# Patient Record
Sex: Male | Born: 1995 | Race: Black or African American | Hispanic: No | Marital: Single | Smoking: Never smoker
Health system: Southern US, Community
[De-identification: ages and names within clinical notes are randomized; demographics above are authoritative.]

## PROBLEM LIST (undated history)

## (undated) ENCOUNTER — Emergency Department (HOSPITAL_COMMUNITY): Payer: Medicaid Other

## (undated) DIAGNOSIS — F840 Autistic disorder: Secondary | ICD-10-CM

## (undated) DIAGNOSIS — F32A Depression, unspecified: Secondary | ICD-10-CM

## (undated) DIAGNOSIS — F329 Major depressive disorder, single episode, unspecified: Secondary | ICD-10-CM

## (undated) DIAGNOSIS — G35D Multiple sclerosis, unspecified: Secondary | ICD-10-CM

## (undated) DIAGNOSIS — G35 Multiple sclerosis: Secondary | ICD-10-CM

## (undated) HISTORY — PX: NO PAST SURGERIES: SHX2092

## (undated) NOTE — *Deleted (*Deleted)
Physical Medicine and Rehabilitation Admission H&P     HPI: Jaime [name] is a 26 year old right-handed male history of mild autism, hypertension recently diagnosed multiple sclerosis March 2021 followed by neurology services Dr. Epimenio Foot.  Patient well-known to rehab services from multiple sclerosis exacerbations 12/17/2019 to 12/26/2019 as well as 05/24/2020 to 06/05/2020.  He was discharged home ambulating contact-guard assist.  He lives with his mother and 41 year old and 5 year old siblings.  Two-level home bed and bath upstairs.  Presented 07/15/2020 with relapsing multiple sclerosis weakness left greater than right.  MRI of the brain showed numerous subcentimeter enhancing lesions within the juxtacortical subcortical white matter of the bilateral cerebral hemispheres.  Placed on intravenous Solu-Medrol x5 doses.  Subcutaneous Lovenox for DVT prophylaxis.  ProAmatine ongoing for history of orthostasis.  Tolerating a regular diet.  Therapy evaluations completed and patient was admitted for a comprehensive rehab program.  Review of Systems  Constitutional: Negative for chills and fever.  HENT: Negative for hearing loss.   Eyes: Positive for blurred vision.  Respiratory: Negative for cough and shortness of breath.   Cardiovascular: Positive for leg swelling.  Gastrointestinal: Positive for constipation. Negative for heartburn and vomiting.  Genitourinary: Negative for dysuria, flank pain and hematuria.  Musculoskeletal: Positive for myalgias.  Skin: Negative for rash.  Neurological: Positive for tremors, sensory change and weakness.  Psychiatric/Behavioral: Positive for depression.       Mild autism  All other systems reviewed and are negative.  Past Medical History:  Diagnosis Date  . Autism   . Depression   . MS (multiple sclerosis) (HCC)    Past Surgical History:  Procedure Laterality Date  . NO PAST SURGERIES     Family History  Problem Relation Age of Onset  . Clotting  disorder Mother    Social History:  reports that he has never smoked. He has never used smokeless tobacco. He reports that he does not drink alcohol and does not use drugs. Allergies: No Known Allergies Medications Prior to Admission  Medication Sig Dispense Refill  . midodrine (PROAMATINE) 2.5 MG tablet Take 3 tablets (7.5 mg total) by mouth 3 (three) times daily with meals. 90 tablet 0  . pantoprazole (PROTONIX) 40 MG tablet Take 1 tablet (40 mg total) by mouth daily. 30 tablet 0  . acetaminophen (TYLENOL) 325 MG tablet Take 2 tablets (650 mg total) by mouth every 4 (four) hours as needed for mild pain or fever (or Fever >/= 101). (Patient not taking: Reported on 07/15/2020)    . melatonin 3 MG TABS tablet Take 2 tablets (6 mg total) by mouth at bedtime. (Patient not taking: Reported on 06/19/2020) 30 tablet 0  . senna (SENOKOT) 8.6 MG TABS tablet Take 2 tablets (17.2 mg total) by mouth 2 (two) times daily at 10 AM and 5 PM. (Patient not taking: Reported on 07/15/2020) 120 tablet 0  . sertraline (ZOLOFT) 50 MG tablet Take 1 tablet (50 mg total) by mouth daily. (Patient not taking: Reported on 07/15/2020) 30 tablet 11    Drug Regimen Review Drug regimen was reviewed and remains appropriate with no significant issues identified  Home: Home Living Family/patient expects to be discharged to:: Private residence Living Arrangements: Parent, Other relatives Available Help at Discharge: Family, Available 24 hours/day Type of Home: Apartment Home Access: Stairs to enter [employer] of Steps: 2 without rail at back entrance, 4 wiht bil rail at front entrance, per pt Entrance Stairs-Rails: Can reach both Home Layout: Two level, Bed/bath upstairs  Alternate Level Stairs-Number of Steps: full flight Alternate Level Stairs-Rails: Right Bathroom Shower/Tub: Engineer, manufacturing systems: Standard Bathroom Accessibility: Yes Home Equipment: Walker - 2 wheels   Functional History:  Prior Function Level of Independence: Needs assistance Gait / Transfers Assistance Needed: pt reports he has been ambulating with use of a walker recently. Fell on 10/8 attempting to get to the bathroom. Pt's mother has been assisting with stairs and ADLs  Functional Status:  Mobility: Bed Mobility Overal bed mobility: Needs Assistance Bed Mobility: Supine to Sit, Sit to Supine Supine to sit: HOB elevated, Supervision Sit to supine: Min assist General bed mobility comments: min A to position trunk back into bed Transfers Overall transfer level: Needs assistance Equipment used: Rolling walker (2 wheeled) Transfers: Sit to/from Stand Sit to Stand: Mod assist General transfer comment: mod A to power up to standing and stabilize, cues for hand placement and problem solve getting walker around obstacle in bathroom Ambulation/Gait Ambulation/Gait assistance: Min assist Gait Distance (Feet): 10 Feet Assistive device: Rolling walker (2 wheeled) Gait Pattern/deviations: Step-to pattern General Gait Details: pt with short step to gait, tremors noted during ambulation Gait velocity: reduced Gait velocity interpretation: <1.31 ft/sec, indicative of household ambulator    ADL: ADL Overall ADL's : Needs assistance/impaired Grooming: Minimal assistance, Sitting Upper Body Bathing: Minimal assistance, Sitting Lower Body Bathing: Moderate assistance, Sit to/from stand, Sitting/lateral leans Upper Body Dressing : Minimal assistance, Sitting Lower Body Dressing: Moderate assistance, Sitting/lateral leans Lower Body Dressing Details (indicate cue type and reason): patient able to doff sock with min A for sitting balance and unable to don  Toilet Transfer: Moderate assistance, Ambulation, Cueing for safety, Cueing for sequencing, Grab bars, RW (bedside commode over toilet ) Toilet Transfer Details (indicate cue type and reason): mod A to power up to standing and stabilize, cues to reach back for  commode arms with sitting for eccentric control  Toileting- Clothing Manipulation and Hygiene: Maximal assistance, Sit to/from stand Toileting - Clothing Manipulation Details (indicate cue type and reason): to manage underwear due to decreased standing balance Functional mobility during ADLs: Moderate assistance, Cueing for safety, Cueing for sequencing, Rolling walker General ADL Comments: patient requiring increased assistance for self care due to decreased balance, activity tolerance, safety awareness  Cognition: Cognition Overall Cognitive Status: History of cognitive impairments - at baseline Orientation Level: Oriented X4 Cognition Arousal/Alertness: Awake/alert Behavior During Therapy: Flat affect Overall Cognitive Status: History of cognitive impairments - at baseline General Comments: slowed processing at times, pt with history of high functioning autism. Pt has good awareness of situation and follows commands well.  Physical Exam: Blood pressure 115/76, pulse 71, temperature 98.4 F (36.9 C), temperature source Oral, resp. rate 18, weight 53.6 kg, SpO2 96 %. Physical Exam Neurological:     Comments: Patient is alert.  Mood is a bit flat but appropriate.  He makes eye contact with examiner.  Provides his name and age.  He does recall his latest stay on inpatient rehab services.     Results for orders placed or performed during the hospital encounter of 07/15/20 (from the past 48 hour(s))  Basic metabolic panel     Status: Abnormal   Collection Time: 07/16/20  7:33 AM  Result Value Ref Range   Sodium 139 135 - 145 mmol/L   Potassium 4.0 3.5 - 5.1 mmol/L   Chloride 105 98 - 111 mmol/L   CO2 22 22 - 32 mmol/L   Glucose, Bld 141 (H) 70 - 99 mg/dL  Comment: Glucose reference range applies only to samples taken after fasting for at least 8 hours.   BUN 15 6 - 20 mg/dL   Creatinine, Ser 1.61 0.61 - 1.24 mg/dL   Calcium 09.6 8.9 - 04.5 mg/dL   GFR, Estimated >40 >98 mL/min    Anion gap 12 5 - 15    Comment: Performed at [facility], [address], [place], [place] [address]  CBC     Status: None   Collection Time: 07/16/20  7:33 AM  Result Value Ref Range   WBC 10.4 4.0 - 10.5 K/uL   RBC 5.00 4.22 - 5.81 MIL/uL   Hemoglobin 15.5 13.0 - 17.0 g/dL   HCT 78.2 39 - 52 %   MCV 90.4 80.0 - 100.0 fL   MCH 31.0 26.0 - 34.0 pg   MCHC 34.3 30.0 - 36.0 g/dL   RDW 95.6 21.3 - 08.6 %   Platelets 258 150 - 400 K/uL   nRBC 0.0 0.0 - 0.2 %    Comment: Performed at [facility], [address] [place], [place] [address]   MR Brain W and Wo Contrast  Result Date: 07/15/2020 CLINICAL DATA:  Multiple sclerosis, new event; new left leg weakness. EXAM: MRI HEAD WITHOUT AND WITH CONTRAST TECHNIQUE: Multiplanar, multiecho pulse sequences of the brain and surrounding structures were obtained without and with intravenous contrast. CONTRAST:  5.65mL GADAVIST GADOBUTROL 1 MMOL/ML IV SOLN COMPARISON:  Brain MRI 05/19/2020. FINDINGS: Brain: Mild intermittent motion degradation. This includes mild motion degradation of the axial T1 weighted postcontrast sequence. There are numerous subcentimeter enhancing lesions within the juxtacortical and subcortical white matter of the bilateral cerebral hemispheres (at least 15-20 in number) (see annotations on series 32, image 34). The majority of, if not all of, these enhancing lesions appear new as compared to the prior examination of 05/19/2020. As before, there is background severe multifocal T2/FLAIR hyperintense signal abnormality within the subcortical/juxtacortical and deep cerebral white matter, corpus callosum, thalami, brainstem, cerebellum and middle cerebellar peduncles. No evidence of intracranial mass. No chronic intracranial blood products. No extra-axial fluid collection. No midline shift. Vascular: Expected proximal arterial flow voids. Skull and upper cervical spine: No focal marrow lesion. Sinuses/Orbits: Visualized orbits  show no acute finding. Trace scattered paranasal sinus mucosal thickening. No significant mastoid effusion. IMPRESSION: Mildly motion degraded examination. Numerous (numbering at least 15-20) subcentimeter enhancing lesions within the juxtacortical and subcortical white matter of the bilateral cerebral hemispheres. The majority of, if not all of, these enhancing lesions appear new as compared to the MRI of 05/19/2020 and are consistent with sites of active demyelination. As before, there is background severe multifocal T2 hyperintense signal abnormality within the cerebral white matter, corpus callosum, thalami, brainstem, cerebellum and middle cerebellar peduncles. These findings are consistent with the provided history of multiple sclerosis. Electronically Signed   By: Jackey Loge DO   On: 07/15/2020 15:09   MR Cervical Spine W or Wo Contrast  Result Date: 07/15/2020 CLINICAL DATA:  Multiple sclerosis, new event. Additional provided: Weakness, difficulty walking. EXAM: MRI CERVICAL SPINE WITHOUT AND WITH CONTRAST TECHNIQUE: Multiplanar and multiecho pulse sequences of the cervical spine, to include the craniocervical junction and cervicothoracic junction, were obtained without and with intravenous contrast. CONTRAST:  5.63mL GADAVIST GADOBUTROL 1 MMOL/ML IV SOLN COMPARISON:  Cervical spine MRI 12/11/2019. FINDINGS: The examination is intermittently motion degraded, limiting evaluation. Most notably, there is moderate motion degradation of the axial T2 GRE sequence, moderate motion degradation  of the axial T2 weighted sequence and moderate/severe motion degradation of the postcontrast axial T1 weighted sequence. Alignment: Straightening of the expected cervical lordosis. No significant spondylolisthesis. Vertebrae: Vertebral body height is maintained. No focal suspicious osseous lesion or significant marrow edema. Cord: Similar to the prior MRI of 12/11/2019, there is extensive multifocal T2/STIR hyperintense  signal abnormality throughout the majority of the cervical spinal cord. Within the limitations of motion degradation, no abnormal cord enhancement is identified to suggest active demyelination. There is associated mild diffuse atrophy of the cervical spinal cord. Posterior Fossa, vertebral arteries, paraspinal tissues: Posterior fossa better assessed on the concurrently performed brain MRI. Flow voids preserved within the imaged cervical vertebral arteries. Paraspinal soft tissues within normal limits. Disc levels: Intervertebral disc height is maintained. No significant disc herniation, spinal canal stenosis or neural foraminal narrowing at any level. IMPRESSION: Motion degraded examination as described and limiting evaluation. Similar to the prior MRI of 12/11/2019, there is extensive multifocal T2 hyperintense signal abnormality affecting the majority of the cervical spinal cord. There is associated mild diffuse cord atrophy and these findings are consistent with the provided history of multiple sclerosis. Within the limitations of motion degradation, no abnormal enhancement of the cervical spinal cord is identified to suggest active demyelination. Electronically Signed   By: Jackey Loge DO   On: 07/15/2020 15:19   MR THORACIC SPINE W WO CONTRAST  Result Date: 07/15/2020 CLINICAL DATA:  Demyelinating disease; MS flare suspected, new left leg weakness. EXAM: MRI THORACIC WITHOUT AND WITH CONTRAST TECHNIQUE: Multiplanar and multiecho pulse sequences of the thoracic spine were obtained without and with intravenous contrast. CONTRAST:  5.39mL GADAVIST GADOBUTROL 1 MMOL/ML IV SOLN COMPARISON:  Thoracic spine MRI 12/16/2019. FINDINGS: MRI THORACIC SPINE FINDINGS Alignment:  No significant spondylolisthesis. Vertebrae: Vertebral body height is maintained. No suspicious osseous lesion or significant marrow edema. Cord: Again demonstrated is extensive multifocal T2/STIR hyperintense signal abnormality throughout the  majority of the thoracic spinal cord. There are several lesions which are new or significantly progressed as compared to the prior examination of 12/16/2019 as follows. A lesion within the central cord at the T4 level (series 23, image 10). A lesion within the right dorsal lateral aspect of the cord at the T5 level (series 23, image 13). A lesion within the right aspect of the spinal cord at the T6 level (series 23, image 16). A lesion within the central cord at the T7-T8 level (series 23, image 19). A lesion within the central cord at the T11 level (series 23, image 30). Faint contrast enhancement is questioned at the sites of the T4 and T5 level lesions and this reflect active demyelination (series 26, image 11). No definite abnormal enhancement is identified elsewhere within the thoracic cord. Paraspinal and other soft tissues: No abnormality identified within included portions of the thorax or upper abdomen. Disc levels: At T4-T5, there is unchanged tiny central disc protrusion which partially effaces the ventral thecal sac and contacts the ventral spinal cord. No significant central canal stenosis. At T6-T7, there is an unchanged small central disc protrusion which partially effaces the ventral thecal sac and contacts the ventral spinal cord. No significant spinal canal stenosis. No significant foraminal narrowing at any level. IMPRESSION: Extensive multifocal T2 hyperintense signal abnormality affecting the majority of the thoracic spinal cord. There are T2 hyperintense lesions at the T4, T5, T6, T7-T8 and T11 levels which are new or significantly progressed as compared to the MRI of 12/16/2019. These findings are consistent with the  provided history of demyelinating disease/multiple sclerosis. There is questionable enhancement corresponding with the T4 and T5 level lesions and this may reflect active demyelination. Electronically Signed   By: Jackey Loge DO   On: 07/15/2020 15:38       Medical Problem  List and Plan: 1.  Decreased functional ability secondary to multiple sclerosis exacerbation.  IV Solu-Medrol x5 completed.  Follow-up outpatient Dr. Epimenio Foot  -patient may *** shower  -ELOS/Goals: *** 2.  Antithrombotics: -DVT/anticoagulation: Subcutaneous Lovenox  -antiplatelet therapy: N/A 3. Pain Management: Tylenol as needed 4. Mood/mild autism: Patient is cognitively at baseline.  Melatonin 6 mg nightly as needed  -antipsychotic agents: N/A 5. Neuropsych: This patient is capable of making decisions on his own behalf. 6. Skin/Wound Care: Routine skin checks 7. Fluids/Electrolytes/Nutrition: Routine in and outs with follow-up chemistries 8.  Orthostasis.  ProAmatine 7.5 mg 3 times daily.  Monitor with increased mobility 9.  Constipation.  MiraLAX as needed    ***  Charlton Amor, PA-C 07/17/2020

---
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
## 2015-10-03 ENCOUNTER — Encounter (HOSPITAL_COMMUNITY): Payer: Self-pay | Admitting: Emergency Medicine

## 2015-10-03 ENCOUNTER — Emergency Department (HOSPITAL_COMMUNITY)
Admission: EM | Admit: 2015-10-03 | Discharge: 2015-10-03 | Disposition: A | Discharge status: Home / Self Care | Payer: Medicaid Other | Attending: Emergency Medicine | Admitting: Emergency Medicine

## 2015-10-03 DIAGNOSIS — R300 Dysuria: Secondary | ICD-10-CM | POA: Insufficient documentation

## 2015-10-03 DIAGNOSIS — R350 Frequency of micturition: Secondary | ICD-10-CM | POA: Diagnosis present

## 2015-10-03 LAB — URINALYSIS, ROUTINE W REFLEX MICROSCOPIC
Bilirubin Urine: NEGATIVE
GLUCOSE, UA: NEGATIVE mg/dL
Hgb urine dipstick: NEGATIVE
KETONES UR: NEGATIVE mg/dL
LEUKOCYTES UA: NEGATIVE
NITRITE: NEGATIVE
PH: 6.5 (ref 5.0–8.0)
Protein, ur: NEGATIVE mg/dL
SPECIFIC GRAVITY, URINE: 1.003 — AB (ref 1.005–1.030)

## 2015-10-03 LAB — CBG MONITORING, ED: Glucose-Capillary: 81 mg/dL (ref 65–99)

## 2015-10-03 NOTE — ED Notes (Signed)
Patient left at this time with all belongings. 

## 2015-10-03 NOTE — ED Provider Notes (Signed)
CSN: [id number]     Arrival date & time 10/03/15  1445 History   First MD Initiated Contact with Patient 10/03/15 2030     Chief Complaint  Patient presents with  . Urinary Frequency     (Consider location/radiation/quality/duration/timing/severity/associated sxs/prior Treatment) Patient is a 19 y.o. male presenting with frequency. The history is provided by the patient and a parent.  Urinary Frequency This is a new problem. The current episode started in the past 7 days. The problem occurs constantly. The problem has been unchanged. Associated symptoms include urinary symptoms. Pertinent negatives include no abdominal pain, anorexia, arthralgias, change in bowel habit, chest pain, chills, congestion, coughing, diaphoresis, fatigue, fever, headaches, joint swelling, myalgias, nausea, neck pain, numbness, sore throat, swollen glands, vertigo, visual change, vomiting or weakness. Nothing aggravates the symptoms. He has tried nothing for the symptoms. The treatment provided no relief.    History reviewed. No pertinent past medical history. History reviewed. No pertinent past surgical history. No family history on file. Social History  Substance Use Topics  . Smoking status: Never Smoker   . Smokeless tobacco: None  . Alcohol Use: No    Review of Systems  Constitutional: Negative for fever, chills, diaphoresis and fatigue.  HENT: Negative for congestion and sore throat.   Eyes: Negative for pain.  Respiratory: Negative for cough.   Cardiovascular: Negative for chest pain.  Gastrointestinal: Negative for nausea, vomiting, abdominal pain, anorexia and change in bowel habit.  Genitourinary: Positive for dysuria and frequency. Negative for hematuria, flank pain, discharge, penile swelling, scrotal swelling, difficulty urinating, penile pain and testicular pain.  Musculoskeletal: Negative for myalgias, joint swelling, arthralgias and neck pain.  Neurological: Negative for vertigo, weakness,  numbness and headaches.      Allergies  Review of patient's allergies indicates no known allergies.  Home Medications   Prior to Admission medications   Not on File   BP 106/76 mmHg  Pulse 88  Temp(Src) 99.1 F (37.3 C) (Oral)  Resp 16  Ht 5\' 9"  (1.753 m)  SpO2 100% Physical Exam  Constitutional: He is oriented to person, place, and time. He appears well-developed and well-nourished. No distress.  HENT:  Head: Normocephalic and atraumatic.  Eyes: Conjunctivae and EOM are normal. Pupils are equal, round, and reactive to light.  Neck: Normal range of motion. Neck supple.  Cardiovascular: Normal rate, regular rhythm and normal heart sounds.  Exam reveals no gallop and no friction rub.   No murmur heard. Pulmonary/Chest: Effort normal and breath sounds normal. No respiratory distress. He has no wheezes. He has no rales. He exhibits no tenderness.  Abdominal: Soft. Bowel sounds are normal. He exhibits no distension and no mass. There is no tenderness. There is no rebound and no guarding. Hernia confirmed negative in the right inguinal area and confirmed negative in the left inguinal area.  Genitourinary: Testes normal and penis normal. Right testis shows no mass, no swelling and no tenderness. Right testis is descended. Left testis shows no mass, no swelling and no tenderness. Left testis is descended. Circumcised. No penile erythema or penile tenderness. No discharge found.  Lymphadenopathy:       Right: No inguinal adenopathy present.       Left: No inguinal adenopathy present.  Neurological: He is alert and oriented to person, place, and time.  Skin: Skin is warm and dry. He is not diaphoretic. No erythema.    ED Course  Procedures (including critical care time) Labs Review Labs Reviewed  URINALYSIS, ROUTINE W  REFLEX MICROSCOPIC (NOT AT [facility]) - Abnormal; Notable for the following:    Specific Gravity, Urine 1.003 (*)    All other components within normal limits  CBG  MONITORING, ED    Imaging Review No results found. I have personally reviewed and evaluated these images and lab results as part of my medical decision-making.   EKG Interpretation None      MDM   Final diagnoses:  Urinary frequency    19 year old with no significant past medical history presents in setting urinary frequency. Per patient and mother patient has had increased urine during this time with mild discomfort at times. Patient denies any hematuria, rashes, fevers, trauma, significant abdominal pain, consultation, diarrhea, recent sick contacts or recent travel. Patient had urinalysis performed which did not show urinary tract infection. Patient afebrile vital signs within normal limits. Fingerstick blood glucose within normal limits. I have low suspicion for diabetes at this time as cause of issue. I discussed sexual activity with patient with parents out of the room and patient adamantly denies history of sexual contact.  On physical examination patient has no significant abnormalities noted. No signs of infection or foreign body. Patient did not want any medication for pain as he denies any pain at this time. At this time this is urinary frequency of unknown origin. No signs of infection, diabetes, suspicion for sexual transmitted infection. Will discharge patient home at this time with plan to follow with PCP for further management of symptoms. Patient given number to establish primary care doctor and given strict return precautions. Patient stable at time of discharge and family in agreement with plan.  Attending is seen and evaluated the patient and Dr. Rhunette Croft in agreement with plan.    Stacy Gardner, MD 10/03/15 8119  Derwood Kaplan, MD 10/04/15 [phone]

## 2015-10-03 NOTE — Discharge Instructions (Signed)
Urinary Frequency °The number of times a normal person urinates depends upon how much liquid they take in and how much liquid they are losing. If the temperature is hot and there is high humidity, then the person will sweat more and usually breathe a little more frequently. These factors decrease the amount of frequency of urination that would be considered normal. °The amount you drink is easily determined, but the amount of fluid lost is sometimes more difficult to calculate.  °Fluid is lost in two ways: °· Sensible fluid loss is usually measured by the amount of urine that you get rid of. Losses of fluid can also occur with diarrhea. °· Insensible fluid loss is more difficult to measure. It is caused by evaporation. Insensible loss of fluid occurs through breathing and sweating. It usually ranges from a little less than a quart to a little more than a quart of fluid a day. °In normal temperatures and activity levels, the average person may urinate 4 to 7 times in a 24-hour period. Needing to urinate more often than that could indicate a problem. If one urinates 4 to 7 times in 24 hours and has large volumes each time, that could indicate a different problem from one who urinates 4 to 7 times a day and has small volumes. The time of urinating is also important. Most urinating should be done during the waking hours. Getting up at night to urinate frequently can indicate some problems. °CAUSES  °The bladder is the organ in your lower abdomen that holds urine. Like a balloon, it swells some as it fills up. Your nerves sense this and tell you it is time to head for the bathroom. There are a number of reasons that you might feel the need to urinate more often than usual. They include: °· Urinary tract infection. This is usually associated with other signs such as burning when you urinate. °· In men, problems with the prostate (a walnut-size gland that is located near the tube that carries urine out of your body). There  are two reasons why the prostate can cause an increased frequency of urination: °¨ An enlarged prostate that does not let the bladder empty well. If the bladder only half empties when you urinate, then it only has half the capacity to fill before you have to urinate again. °¨ The nerves in the bladder become more hypersensitive with an increased size of the prostate even if the bladder empties completely. °· Pregnancy. °· Obesity. Excess weight is more likely to cause a problem for women than for men. °· Bladder stones or other bladder problems. °· Caffeine. °· Alcohol. °· Medications. For example, drugs that help the body get rid of extra fluid (diuretics) increase urine production. Some other medicines must be taken with lots of fluids. °· Muscle or nerve weakness. This might be the result of a spinal cord injury, a stroke, multiple sclerosis, or Parkinson disease. °· Long-standing diabetes can decrease the sensation of the bladder. This loss of sensation makes it harder to sense the bladder needs to be emptied. Over a period of years, the bladder is stretched out by constant overfilling. This weakens the bladder muscles so that the bladder does not empty well and has less capacity to fill with new urine. °· Interstitial cystitis (also called painful bladder syndrome). This condition develops because the tissues that line the inside of the bladder are inflamed (inflammation is the body's way of reacting to injury or infection). It causes pain and frequent   urination. It occurs in women more often than in men. °DIAGNOSIS  °· To decide what might be causing your urinary frequency, your health care provider will probably: °¨ Ask about symptoms you have noticed. °¨ Ask about your overall health. This will include questions about any medications you are taking. °¨ Do a physical examination. °· Order some tests. These might include: °¨ A blood test to check for diabetes or other health issues that could be contributing  to the problem. °¨ Urine testing. This could measure the flow of urine and the pressure on the bladder. °¨ A test of your neurological system (the brain, spinal cord, and nerves). This is the system that senses the need to urinate. °¨ A bladder test to check whether it is emptying completely when you urinate. °¨ Cystoscopy. This test uses a thin tube with a tiny camera on it. It offers a look inside your urethra and bladder to see if there are problems. °¨ Imaging tests. You might be given a contrast dye and then asked to urinate. X-rays are taken to see how your bladder is working. °TREATMENT  °It is important for you to be evaluated to determine if the amount or frequency that you have is unusual or abnormal. If it is found to be abnormal, the cause should be determined and this can usually be found out easily. Depending upon the cause, treatment could include medication, stimulation of the nerves, or surgery. °There are not too many things that you can do as an individual to change your urinary frequency. It is important that you balance the amount of fluid intake needed to compensate for your activity and the temperature. Medical problems will be diagnosed and taken care of by your physician. There is no particular bladder training such as Kegel exercises that you can do to help urinary frequency. This is an exercise that is usually recommended for people who have leaking of urine when they laugh, cough, or sneeze. °HOME CARE INSTRUCTIONS  °· Take any medications your health care provider prescribed or suggested. Follow the directions carefully. °· Practice any lifestyle changes that are recommended. These might include: °¨ Drinking less fluid or drinking at different times of the day. If you need to urinate often during the night, for example, you may need to stop drinking fluids early in the evening. °¨ Cutting down on caffeine or alcohol. They both can make you need to urinate more often than normal. Caffeine  is found in coffee, tea, and sodas. °¨ Losing weight, if that is recommended. °· Keep a journal or a log. You might be asked to record how much you drink and when and where you feel the need to urinate. This will also help evaluate how well the treatment provided by your physician is working. °SEEK MEDICAL CARE IF:  °· Your need to urinate often gets worse. °· You feel increased pain or irritation when you urinate. °· You notice blood in your urine. °· You have questions about any medications that your health care provider recommended. °· You notice blood, pus, or swelling at the site of any test or treatment procedure. °· You develop a fever of more than 100.5°F (38.1°C). °SEEK IMMEDIATE MEDICAL CARE IF:  °You develop a fever of more than 102.0°F (38.9°C). °  °This information is not intended to replace advice given to you by your health care provider. Make sure you discuss any questions you have with your health care provider. °  °Document Released: 07/17/2009 Document Revised:   10/11/2014 Document Reviewed: 07/17/2009 Elsevier Interactive Patient Education 2016 [employer].  Emergency Department Resource Guide 1) Find a Doctor and Pay Out of Pocket Although you won't have to find out who is covered by your insurance plan, it is a good idea to ask around and get recommendations. You will then need to call the office and see if the doctor you have chosen will accept you as a new patient and what types of options they offer for patients who are self-pay. Some doctors offer discounts or will set up payment plans for their patients who do not have insurance, but you will need to ask so you aren't surprised when you get to your appointment.  2) Contact Your Local Health Department Not all health departments have doctors that can see patients for sick visits, but many do, so it is worth a call to see if yours does. If you don't know where your local health department is, you can check in your phone book. The  CDC also has a tool to help you locate your state's health department, and many state websites also have listings of all of their local health departments.  3) Find a Walk-in Clinic If your illness is not likely to be very severe or complicated, you may want to try a walk in clinic. These are popping up all over the country in pharmacies, drugstores, and shopping centers. They're usually staffed by nurse practitioners or physician assistants that have been trained to treat common illnesses and complaints. They're usually fairly quick and inexpensive. However, if you have serious medical issues or chronic medical problems, these are probably not your best option.  No Primary Care Doctor: - Call Health Connect at  [phone] - they can help you locate a primary care doctor that  accepts your insurance, provides certain services, etc. - Physician Referral Service- [phone]  Chronic Pain Problems: Organization         Address  Phone   Notes  [facility]  [phone] Patients need to be referred by their primary care doctor.   Medication Assistance: Organization         Address  Phone   Notes  [facility] [address] [place] [place], [place] [address] [phone] --Must be a resident of [facility] -- Must have NO insurance coverage whatsoever (no Medicaid/ Medicare, etc.) -- The pt. MUST have a primary care doctor that directs their care regularly and follows them in the community   [facility]  [phone]   [employer]  [phone]    Agencies that provide inexpensive medical care: Organization         Address  Phone   Notes  [facility]  [phone]   [facility]    [phone]   [facility] [address] [employer], [place] [address] [phone]   [facility] [address] [place] [place] [phone]   Planned Parenthood    [phone]   [facility]    [phone]   [facility]  [address], [place] Phone:  [phone], Fax:  [phone] Hours of Operation:  9 am - 6 pm, M-F.  Also accepts Medicaid/Medicare and self-pay.  [facility]  [address], [place] Phone: [phone], Fax: [phone]. Hours of Operation:  8:30 am - 5:30  pm, M-F.  Also accepts Medicaid and self-pay.  [facility] [place] [address] [place] Phone: [phone]   [facility] [address] Natasha Bence [place], [place] [phone], Ext. [phone] Mondays & Thursdays: 7-9 AM.  First 15 patients are seen on a first come, first serve basis.    Medicaid-accepting [facility] Providers:  Organization         Address  Phone   Notes  [facility] [address] [place] [address], [place] [phone] Also accepts self-pay patients.  [facility] [address] Laurell Josephs [place], [place]  [phone]   [facility] [address], [place] [phone]   [facility] [address] [place] [phone]   Renaye Rakers [address], [place]   [phone] Only accepts [place] patients after they have their name applied to their card.   Self-Pay (no insurance) in [facility]:  Organization         Address  Phone   Notes  Sickle Cell Patients, [facility] [address] [place], [place] [phone]   [facility] [address] [place], [place] [phone]   [facility] [place]  [address], [place] [phone]   [facility]/Dr. Osei-Bonsu  [address] [place] or [address], [place] [phone] Phone number for both [place] and [place] locations is the same.  [facility] [address] [place] [phone]   [facility] [address] [place] or [address] [phone]   [facility] [address] [place] [phone], phone; [phone], fax Sees patients 1st and 3rd Saturday of every month.  Must not qualify for public or private insurance (i.e. Medicaid, Medicare, [place] Health Choice, Veterans' Benefits)  Household income should be no more than 200% of the poverty level The clinic cannot treat you if you are pregnant or think you are pregnant  Sexually transmitted diseases are not treated at the clinic.    Dental Care: Organization         Address  Phone  Notes  [facility] [address] [place], [place] [phone] Accepts children up to age 79 who are enrolled in [place] or [place] Health Choice; pregnant women with a Medicaid card; and children who have applied for Medicaid or [place] Health Choice, but were declined, whose parents can pay a reduced fee at time of service.  [facility] of [facility]  [address], [place] [phone] Accepts children up to age 29 who are enrolled in [place] or [place] Health Choice; pregnant women with a Medicaid card; and children who have applied for Medicaid or [place] Health Choice, but were declined, whose parents can pay a reduced fee at time of service.  [facility]  [address] [place], [place] [phone] Patients are seen by appointment only. Walk-ins are not accepted. [facility] will see patients 64 years of age and older. Monday - Tuesday (8am-5pm) Most Wednesdays (8:30-5pm) $30 per visit, cash only  [facility] Adult [facility]  [address], [facility] [phone] Patients are seen by appointment only. Walk-ins are not accepted.  [facility] will see patients 35 years of age and older. One  Wednesday Evening (Monthly: Volunteer Based).  $30 per visit, cash only  [employer]  [phone] for adults; Children under age 26, call Graduate Pediatric Dentistry at [phone]. Children aged 22-14, please call [phone] to request a pediatric application.  Dental services are provided in all areas of dental care including fillings, crowns and bridges, complete and partial dentures, implants, gum treatment, root canals, and extractions. Preventive care is also provided. Treatment is provided to both adults and children. Patients are selected via a lottery and there is often a waiting list.   [facility] [address] [place]  [phone] [email or web address]   [facility] [address] [place], [place] [phone], Ext. [phone] Second and Fourth Thursday of each month, opens at 6:30 AM; Clinic ends at 9 AM.  Patients are seen on a first-come first-served basis, and a limited number are seen during each clinic.   [facility]  [address] Ether Griffins [place], [place] [phone]   Eligibility Requirements You must have lived in [place], [place], or [place] counties for at least the last three months.   You cannot be eligible for state or federal sponsored [place], including [employer], [place], or [employer].   You generally cannot be eligible for healthcare insurance through your employer.    How to apply: Eligibility screenings are held every Tuesday and Wednesday afternoon from 1:00 pm until 4:00 pm. You do not need an appointment for the interview!  [facility] [address] [place], [place] [phone]   [facility] Health Department  [phone]   [facility]  [phone]   [facility] Health Department  [phone]    Behavioral Health Resources in the Community: Intensive Outpatient Programs Organization          Address  Phone  Notes  [facility] [address] [place], [place] [phone]   [facility] Outpatient [address] [place], [place] [phone]   [facility] [address] [place], [place]  [phone]   [facility] Mental Health [address],  [place], [place] [phone] or [phone]   Substance Abuse Resources Organization         Address  Phone  Notes  [facility]  [phone]   [facility]  [phone]   [facility]  [phone]   Floydene Flock  [phone]   Residential & Outpatient Substance Abuse Program  [phone]   Psychological Services Organization         Address  Phone  Notes  [facility] Behavioral Health  [phone]   [facility]  [phone]   [facility] Mental Health [address], [place] [phone] or [phone]    Mobile Crisis Teams Organization         Address  Phone  Notes  [facility], Mobile Crisis Care Unit  [phone]   [facility]  [address] [place], [place] [phone]   Doristine Locks [address] [place] [phone]    Self-Help/Support Groups Organization         Address  Phone             Notes  [facility] [place] - variety of support groups  336- I7437963 Call for more information  Narcotics Anonymous (NA), [facility]  [address], [employer] [place]  2 meetings at this location   Residential Sports administrator         Address  Phone  Notes  [facility] [address],    [place]  [phone]   [facility]  [address] [place] [address], [place], [place] [phone]   [facility] [address] [place], [place] [phone] Admissions: 8am-3pm M-F  [facility] [address],    [place], [place] [phone]   [facility] [address] [place], [place], [place] [phone]   [facility] [address],  [place], [place] [phone]   [facility] - Intensive Outpatient [address] Laurell Josephs [address], [place], [place] [phone]   [facility] ([facility] [address] [place]  [place], [place] [phone] or [phone]   [facility] ([facility]) [address] [place], [place] [phone] Accepts Medicaid  [facility] [address]  [place] [phone] Substance Abuse/Addiction Treatment   [facility] Organization         Address  Phone  Notes  [facility]  [phone]   Angie Fava, PhD [address] Ervin Knack [place], [place]   [phone] or [phone]   [facility] Behavioral   [address] [place], [place] [phone]   [facility] [address] [place], [place] [phone] Insurance/Medicaid/sponsorship through [facility] and Families [address]                                    [place], [place] [phone] Therapy/tele-psych/case  [facility] [address], [place] [phone]    Dr. Lolly Mustache  [phone]   [facility]  [employer] [facility] Dept. 1) [address] [place] 2) [address] [place] 3)  [address], [place] [phone]  [phone]   [facility] Child Abuse Hotline [phone] or [phone] (After Hours)

## 2015-10-03 NOTE — ED Notes (Signed)
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

## 2015-10-03 NOTE — ED Notes (Signed)
Pt from home for eval of bladder irritation that started on Christmas, denies any abd pain dysuria. Pt mother states pt has been urinating more but due to drinking more water to help with irritation. nad noted at this time. Denies any n/v/d or fevers.

## 2015-10-03 NOTE — ED Notes (Signed)
CBG was 81. 

## 2019-12-11 ENCOUNTER — Emergency Department (HOSPITAL_COMMUNITY): Payer: Medicaid Other

## 2019-12-11 ENCOUNTER — Other Ambulatory Visit: Payer: Self-pay

## 2019-12-11 ENCOUNTER — Encounter (HOSPITAL_COMMUNITY): Payer: Self-pay

## 2019-12-11 ENCOUNTER — Inpatient Hospital Stay (HOSPITAL_COMMUNITY)
Admission: EM | Admit: 2019-12-11 | Discharge: 2019-12-17 | DRG: 059 | Disposition: A | Discharge status: Rehabilitation Facility | Payer: Medicaid Other | Attending: Internal Medicine | Admitting: Internal Medicine

## 2019-12-11 DIAGNOSIS — R29898 Other symptoms and signs involving the musculoskeletal system: Secondary | ICD-10-CM | POA: Diagnosis present

## 2019-12-11 DIAGNOSIS — R531 Weakness: Secondary | ICD-10-CM

## 2019-12-11 DIAGNOSIS — T380X5A Adverse effect of glucocorticoids and synthetic analogues, initial encounter: Secondary | ICD-10-CM | POA: Diagnosis not present

## 2019-12-11 DIAGNOSIS — M5127 Other intervertebral disc displacement, lumbosacral region: Secondary | ICD-10-CM | POA: Diagnosis present

## 2019-12-11 DIAGNOSIS — H538 Other visual disturbances: Secondary | ICD-10-CM | POA: Diagnosis present

## 2019-12-11 DIAGNOSIS — Z20822 Contact with and (suspected) exposure to covid-19: Secondary | ICD-10-CM | POA: Diagnosis present

## 2019-12-11 DIAGNOSIS — G4700 Insomnia, unspecified: Secondary | ICD-10-CM | POA: Diagnosis not present

## 2019-12-11 DIAGNOSIS — R0602 Shortness of breath: Secondary | ICD-10-CM

## 2019-12-11 DIAGNOSIS — R739 Hyperglycemia, unspecified: Secondary | ICD-10-CM | POA: Diagnosis not present

## 2019-12-11 DIAGNOSIS — Z820 Family history of epilepsy and other diseases of the nervous system: Secondary | ICD-10-CM

## 2019-12-11 DIAGNOSIS — R001 Bradycardia, unspecified: Secondary | ICD-10-CM | POA: Diagnosis not present

## 2019-12-11 DIAGNOSIS — G379 Demyelinating disease of central nervous system, unspecified: Secondary | ICD-10-CM | POA: Diagnosis present

## 2019-12-11 DIAGNOSIS — Y92230 Patient room in hospital as the place of occurrence of the external cause: Secondary | ICD-10-CM | POA: Diagnosis not present

## 2019-12-11 DIAGNOSIS — R296 Repeated falls: Secondary | ICD-10-CM | POA: Diagnosis present

## 2019-12-11 DIAGNOSIS — K5900 Constipation, unspecified: Secondary | ICD-10-CM | POA: Diagnosis not present

## 2019-12-11 DIAGNOSIS — D72818 Other decreased white blood cell count: Secondary | ICD-10-CM | POA: Diagnosis not present

## 2019-12-11 DIAGNOSIS — H5509 Other forms of nystagmus: Secondary | ICD-10-CM | POA: Diagnosis present

## 2019-12-11 DIAGNOSIS — G35 Multiple sclerosis: Principal | ICD-10-CM | POA: Diagnosis present

## 2019-12-11 DIAGNOSIS — E876 Hypokalemia: Secondary | ICD-10-CM | POA: Diagnosis present

## 2019-12-11 DIAGNOSIS — Z832 Family history of diseases of the blood and blood-forming organs and certain disorders involving the immune mechanism: Secondary | ICD-10-CM

## 2019-12-11 DIAGNOSIS — F329 Major depressive disorder, single episode, unspecified: Secondary | ICD-10-CM | POA: Diagnosis present

## 2019-12-11 DIAGNOSIS — Z9181 History of falling: Secondary | ICD-10-CM

## 2019-12-11 DIAGNOSIS — F840 Autistic disorder: Secondary | ICD-10-CM | POA: Diagnosis present

## 2019-12-11 HISTORY — DX: Autistic disorder: F84.0

## 2019-12-11 HISTORY — DX: Depression, unspecified: F32.A

## 2019-12-11 LAB — BASIC METABOLIC PANEL
Anion gap: 9 (ref 5–15)
BUN: 13 mg/dL (ref 6–20)
CO2: 23 mmol/L (ref 22–32)
Calcium: 9.2 mg/dL (ref 8.9–10.3)
Chloride: 109 mmol/L (ref 98–111)
Creatinine, Ser: 0.8 mg/dL (ref 0.61–1.24)
GFR calc Af Amer: >60 mL/min (ref >60)
GFR calc non Af Amer: >60 mL/min (ref >60)
Glucose, Bld: 89 mg/dL (ref 70–99)
Potassium: 3.4 mmol/L — ABNORMAL LOW (ref 3.5–5.1)
Sodium: 141 mmol/L (ref 135–145)

## 2019-12-11 LAB — CBC WITH DIFFERENTIAL/PLATELET
Abs Immature Granulocytes: 0.03 10*3/uL (ref 0.00–0.07)
Basophils Absolute: 0.1 10*3/uL (ref 0.0–0.1)
Basophils Relative: 1 %
Eosinophils Absolute: 0.1 10*3/uL (ref 0.0–0.5)
Eosinophils Relative: 1 %
HCT: 43.3 % (ref 39.0–52.0)
Hemoglobin: 14.9 g/dL (ref 13.0–17.0)
Immature Granulocytes: 0 %
Lymphocytes Relative: 19 %
Lymphs Abs: 1.9 10*3/uL (ref 0.7–4.0)
MCH: 31.4 pg (ref 26.0–34.0)
MCHC: 34.4 g/dL (ref 30.0–36.0)
MCV: 91.4 fL (ref 80.0–100.0)
Monocytes Absolute: 0.6 10*3/uL (ref 0.1–1.0)
Monocytes Relative: 6 %
Neutro Abs: 7.5 10*3/uL (ref 1.7–7.7)
Neutrophils Relative %: 73 %
Platelets: 207 10*3/uL (ref 150–400)
RBC: 4.74 MIL/uL (ref 4.22–5.81)
RDW: 12.8 % (ref 11.5–15.5)
WBC: 10.1 10*3/uL (ref 4.0–10.5)
nRBC: 0 % (ref 0.0–0.2)

## 2019-12-11 LAB — VITAMIN B12: Vitamin B-12: 678 pg/mL (ref 180–914)

## 2019-12-11 MED ORDER — GADOBUTROL 1 MMOL/ML IV SOLN
5.0000 mL | Freq: Once | INTRAVENOUS | Status: AC | PRN
  Administered 2019-12-11: 5 mL via INTRAVENOUS

## 2019-12-11 MED ORDER — LORAZEPAM 1 MG PO TABS
1.0000 mg | ORAL_TABLET | ORAL | Status: DC | PRN
  Administered 2019-12-11: 1 mg via ORAL
  Filled 2019-12-11: qty 1

## 2019-12-11 MED ORDER — SODIUM CHLORIDE 0.9 % IV SOLN
1000.0000 mg | Freq: Once | INTRAVENOUS | Status: AC
  Administered 2019-12-12: 1000 mg via INTRAVENOUS
  Filled 2019-12-11: qty 8

## 2019-12-11 NOTE — ED Notes (Signed)
Pt to MRI via stretcher. Pt will be in MRI for approximately 2 hours per MRI tech. Calm and cooperative at this time.

## 2019-12-11 NOTE — Consult Note (Addendum)
Neurology Consultation  Reason for Consult: Difficulty walking, generalized weakness, slurred speech Referring Physician: Dr. Stevie Kern, EDP  CC: Difficulty falling, frequent falls, generalized weakness, slurred speech  History is obtained from: Patient, mother  HPI: Jaime [name] is a 24 y.o. male who is highly functioning autistic young individual, with a past history of depression, presented to the emergency room for evaluation of multitude of neurological complaints including difficulty walking, frequent falls, slurred speech and generalized weakness. His mother reports that he had multiple falls in the past 2 to 3 days and seemed very clumsy while walking.  Today he was walking the dog, fell on the ground without any provocation and hit his head on the ground.  She reports that this has been ongoing at least since December 2020 when he has been having trouble with his walking and coordination.  Upon questioning of any visual symptoms, she said that he had blurred vision for a few weeks even prior to December.  Mother also reported jumbled speech and slurred speech at some point. He was evaluated in the emergency room and the case was discussed with the on-call neuro hospitalist during the daytime who recommended a brain, and whole spine MRI with and without contrast be performed.  This was completed over at [facility] and revealed numerous areas of active demyelination in both supratentorial and infratentorial white matter as well as cervical and thoracic spinal cord with multiple lesions that were enhancing.  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 No bowel or bladder complaints.  No shock down the spine when bending the neck.  No real difference between hot and cold weather is in terms of symptom variability.  Mother reports that the patient has not seen a physician in a while as he does not have Medicaid and due to the pandemic, access to  health care has been a serious issue for them. Developmentally, he has completed high school and the mother says he was planning to enroll in a special program for young adults with developmental disabilities at [facility] but that had to be delayed due to Covid.  ROS:ROS was performed and is negative except as noted in the HPI.    Past Medical History:  Diagnosis Date  . Autism   . Depression     Family History  Problem Relation Age of Onset  . Clotting disorder Mother   Wonda Olds with multiple sclerosis   Social History:   reports that he has never smoked. He has never used smokeless tobacco. He reports that he does not drink alcohol or use drugs.  Medications  Current Facility-Administered Medications:  .  LORazepam (ATIVAN) tablet 1 mg, 1 mg, Oral, PRN, Milagros Loll, MD, 1 mg at 12/11/19 1814 .  methylPREDNISolone sodium succinate (SOLU-MEDROL) 1,000 mg in sodium chloride 0.9 % 50 mL IVPB, 1,000 mg, Intravenous, Once, Dykstra, Quitman Livings, MD No current outpatient medications on file.  Exam: Current vital signs: BP 112/76   Pulse 62   Temp 98.8 F (37.1 C) (Oral)   Resp 16   Ht 6\' 1"  (1.854 m)   Wt 54.4 kg   SpO2 96%   BMI 15.83 kg/m  Vital signs in last 24 hours: Temp:  [98.3 F (36.8 C)-98.8 F (37.1 C)] 98.8 F (37.1 C) (03/09 1756) Pulse Rate:  [59-95] 62 (03/09 2300) Resp:  [16-18] 16 (03/09 2300) BP: (97-119)/(69-89) 112/76 (03/09 2300) SpO2:  [96 %-100 %] 96 % (03/09 2300) Weight:  [54.4 kg]  54.4 kg (03/09 1607) General: Awake alert in no distress HEENT: Normocephalic atraumatic Lungs clear to auscultation Cardiovascular: Regular rate rhythm Extremities warm well perfused Abdomen nondistended nontender Neurological exam He is awake alert oriented x3 Speech is mildly dysarthric Naming comprehension and repetition are intact No evidence of aphasia Cranial nerves: Pupils are equal round reactive to light, no APD, no restriction in extraocular movement  and no evidence of INO, visual fields are full, face appears symmetric, facial sensation intact, tongue and palate midline. Motor exam: He has 4+/5 strength in both upper extremities.  He has 4/5 right lower extremity hip flexors knee flexors and plantar and dorsiflexion.  He has 4+/5 left lower extremity hip knee and plantar dorsiflexion. Sensory exam: Symmetric bilaterally with no discernible level. Coordination: He is grossly ataxic on heel-knee-shin testing bilaterally.  There is subtle ataxia in bilateral upper extremities as well. Gait testing was deferred at this time but ED provider reported that he was very ataxic when attempting to walk.   Labs I have reviewed labs in epic and the results pertinent to this consultation are:  CBC    Component Value Date/Time   WBC 10.1 12/11/2019 2110   RBC 4.74 12/11/2019 2110   HGB 14.9 12/11/2019 2110   HCT 43.3 12/11/2019 2110   PLT 207 12/11/2019 2110   MCV 91.4 12/11/2019 2110   MCH 31.4 12/11/2019 2110   MCHC 34.4 12/11/2019 2110   RDW 12.8 12/11/2019 2110   LYMPHSABS 1.9 12/11/2019 2110   MONOABS 0.6 12/11/2019 2110   EOSABS 0.1 12/11/2019 2110   BASOSABS 0.1 12/11/2019 2110    CMP     Component Value Date/Time   NA 141 12/11/2019 2110   K 3.4 (L) 12/11/2019 2110   CL 109 12/11/2019 2110   CO2 23 12/11/2019 2110   GLUCOSE 89 12/11/2019 2110   BUN 13 12/11/2019 2110   CREATININE 0.80 12/11/2019 2110   CALCIUM 9.2 12/11/2019 2110   GFRNONAA >60 12/11/2019 2110   GFRAA >60 12/11/2019 2110   B12 normal  Imaging I have reviewed the images obtained: MRI examination of the brain, C-spine, T-spine and L-spine: Extensive T2 FLAIR signal abnormality throughout the supratentorial and infratentorial cerebral hemisphere consistent with demyelinating disease with associated enhancement in numerous lesions consistent with active demyelination.  Extensive signal abnormality involving essentially the entirety of the thoracic spinal cord  consistent with demyelinating disease with patchy postcontrast enhancement at T1-2 and T10-11.  Extensive signal abnormality in the cervical spine involving essentially majority of the spinal cord consistent with demyelinating disease.  No abnormal enhancement in the cervical spine.  Lumbar spine imaging also showed patchy signal abnormality within the visualized distal cord consistent with demyelinating disease and no abnormal enhancement.  Assessment: 24 year old highly functioning autistic young individual with a history of depression presents with 3 to 4 months of multiple neurological symptoms that include difficulty walking incoordination and slurred speech and neuroimaging of the whole neuraxis consistent with an active demyelinating disease-top of the differentials is multiple sclerosis versus neuromyelitis optica due to longitudinally extensive transverse myelitis.  Other differentials would include neurosarcoid and CNS lymphomatous process.  Recommendations: -Recommended an LP be performed in the emergency room and other than the usual testing for glucose, protein, cell count, additional testing should include flow cytometry, cytology, oligoclonal bands and IgG index. -Start IV Solu-Medrol 1000 mg for at least 5 days.  Can consider 2 additional doses. -Based on the clinical response, duration of the steroid treatment could be extended as  above. -Due to the aggressive nature of the disease in this patient, can consider plasma exchange if clinically appropriate. -He will need follow-up with MS specialist outpatient-Dr. Olive Bass at [facility] neurology. -Likely will be candidate for Tysabri or Ocrevus as outpatient. -Needs PT OT and speech therapy evaluation -Check urinalysis and chest x-ray to ensure there is no evidence of infection as he will be on steroids. -Check sugars regularly while on steroids. -Use PPIs to minimize GI side effects of steroids. -B12 is within normal range, check,  serum ACE and CSF ACE levels as well. Neurology will continue to follow with you intermittently.  Would recommend admission to [facility].  Discussed my plan in detail with the mother at bedside and Dr. Stevie Kern in the ED.  -- Milon Dikes, MD [facility] Pager: [phone] If 7pm to 7am, please call on call as listed on [facility].

## 2019-12-11 NOTE — ED Triage Notes (Signed)
[name] reports that the patient tripped and hit his head x 3 days ago, fell a couple of times going up and down he stairs a few days ago, and today the patient was walking the dog and fell, hitting his head on the ground.  [name] reports that the patient has had slowed motor responses since  X 4 months. [name] reports that the patient is walking like a toddler and has gotten worse recently. [name] states his words are "jumbled" at times when he speaks. Patient does have autism.

## 2019-12-11 NOTE — ED Provider Notes (Signed)
[facility]-EMERGENCY DEPT Provider Note   CSN: [id number] Arrival date & time: 12/11/19  1534     History Chief Complaint  Patient presents with  . Fall  . Head Injury    Jaime [name] is a 24 y.o. male.  Presents to ER with chief complaint difficulty walking, frequent falls.  Mother reports since December she has noticed patient has been having hard time walking, walks like a toddler.  States has now been requiring increasing amount of assistance to walk during the day.  Today was trying to walk the dog and fell at least 3 or 4 times..  No noted head trauma.  Couple months ago patient had some episodes of some blurry vision, no associated eye pain.  He does not have any blurry vision today.  No numbness, weakness just feels unsteady in his legs.  Past medical history autism.  No known autoimmune disorder history and family.  HPI     Past Medical History:  Diagnosis Date  . Autism   . Depression     There are no problems to display for this patient.   History reviewed. No pertinent surgical history.     Family History  Problem Relation Age of Onset  . Clotting disorder Mother     Social History   Tobacco Use  . Smoking status: Never Smoker  . Smokeless tobacco: Never Used  Substance Use Topics  . Alcohol use: No  . Drug use: Never    Home Medications Prior to Admission medications   Not on File    Allergies    Patient has no known allergies.  Review of Systems   Review of Systems  Constitutional: Negative for chills and fever.  HENT: Negative for ear pain and sore throat.   Eyes: Negative for pain and visual disturbance.  Respiratory: Negative for cough and shortness of breath.   Cardiovascular: Negative for chest pain and palpitations.  Gastrointestinal: Negative for abdominal pain and vomiting.  Genitourinary: Negative for dysuria and hematuria.  Musculoskeletal: Negative for arthralgias and back pain.  Skin: Negative for  color change and rash.  Neurological: Negative for seizures and syncope.  All other systems reviewed and are negative.   Physical Exam Updated Vital Signs BP 97/70 (BP Location: Left Arm)   Pulse 95   Temp 98.3 F (36.8 C) (Oral)   Resp 16   Ht 6\' 1"  (1.854 m)   Wt 54.4 kg   SpO2 99%   BMI 15.83 kg/m   Physical Exam Vitals and nursing note reviewed.  Constitutional:      Appearance: He is well-developed.  HENT:     Head: Normocephalic and atraumatic.  Eyes:     Conjunctiva/sclera: Conjunctivae normal.  Cardiovascular:     Rate and Rhythm: Normal rate and regular rhythm.     Heart sounds: No murmur.  Pulmonary:     Effort: Pulmonary effort is normal. No respiratory distress.     Breath sounds: Normal breath sounds.  Abdominal:     Palpations: Abdomen is soft.     Tenderness: There is no abdominal tenderness.  Musculoskeletal:     Cervical back: Neck supple.  Skin:    General: Skin is warm and dry.  Neurological:     Mental Status: He is alert.     GCS: GCS eye subscore is 4. GCS verbal subscore is 5. GCS motor subscore is 6.     Comments: Cranial nerves II through XII intact, normal finger-nose-finger, abnormal heel-to-shin,  broad-based and coordinated gait, unable to ambulate without assistance, brisk patellar reflexes bilateral, approximately 5 beats of clonus bilaterally     ED Results / Procedures / Treatments   Labs (all labs ordered are listed, but only abnormal results are displayed) Labs Reviewed - No data to display  EKG None  Radiology No results found.  Procedures .Lumbar Puncture  Date/Time: 12/12/2019 12:07 AM Performed by: Milagros Loll, MD Authorized by: Milagros Loll, MD   Consent:    Consent obtained:  Verbal and written   Consent given by:  Patient and parent   Risks discussed:  Bleeding, infection, headache, nerve damage, repeat procedure and pain   Alternatives discussed:  No treatment, delayed treatment, alternative  treatment, observation and referral Universal protocol:    Patient identity confirmed:  Verbally with patient Pre-procedure details:    Procedure purpose:  Diagnostic   Preparation: Patient was prepped and draped in usual sterile fashion   Anesthesia (see MAR for exact dosages):    Anesthesia method:  Local infiltration   Local anesthetic:  Lidocaine 1% w/o epi Procedure details:    Lumbar space:  L4-L5 interspace   Patient position:  Sitting   Needle gauge:  22   Needle length (in):  2.5   Ultrasound guidance: no     Number of attempts:  1   Fluid appearance:  Clear   Tubes of fluid:  4   Total volume (ml):  12 Post-procedure:    Patient tolerance of procedure:  Tolerated well, no immediate complications Comments:     Patient tolerated lumbar puncture well, no immediate complications, 12 mL fluid obtained per neurology request for specialized studies .Critical Care Performed by: Milagros Loll, MD Authorized by: Milagros Loll, MD   Critical care provider statement:    Critical care time (minutes):  35   Critical care was necessary to treat or prevent imminent or life-threatening deterioration of the following conditions:  CNS failure or compromise   Critical care was time spent personally by me on the following activities:  Discussions with consultants, evaluation of patient's response to treatment, examination of patient, ordering and performing treatments and interventions, ordering and review of laboratory studies, ordering and review of radiographic studies, pulse oximetry, re-evaluation of patient's condition, obtaining history from patient or surrogate and review of old charts   (including critical care time)  Medications Ordered in ED Medications - No data to display  ED Course  I have reviewed the triage vital signs and the nursing notes.  Pertinent labs & imaging results that were available during my care of the patient were reviewed by me and considered in  my medical decision making (see chart for details).  Clinical Course as of Dec 12 11  Tue Dec 11, 2019  1709 Completed chart review, will go assess patient   [RD]  1807 D/w Lindzen with neurology - recommends checking thiamine, b12, copper, getting MRI Brain, C, T, and L spine to further eval   [RD]  2236 Reviewed MRI, will discuss with neuro   [RD]  2240 [place] will come to bedside to evaluate, requests LP with 12-63mL CSF sample   [RD]  Wed Dec 12, 2019  0004 Completed LP   [RD]    Clinical Course User Index [RD] Milagros Loll, MD   MDM Rules/Calculators/A&P                      24 year old male presented to ER with progressive  gait instability, difficulty walking over the past couple months.  Now with frequent falls.  Also had a couple episodes of some vision changes a couple months ago.  Physical exam notable for very abnormal gait, unable to ambulate without assistance, brisk patellar reflexes.  I reviewed with Lindzen who recommended MRI.  MRI concerning for acute demyelinating process, most likely multiple sclerosis. Given robust reflexes in lower extremities, doubt GBS. Reviewed with Dr. Malen Gauze MRI findings.  He came to bedside and evaluated patient.  Recommended admission to hospitalist service at [facility], high-dose IV steroids, lumbar puncture.    Final Clinical Impression(s) / ED Diagnoses Final diagnoses:  Demyelinating disease ([place])  Multiple sclerosis ([place])    Rx / DC Orders ED Discharge Orders    None       Lucrezia Starch, MD 12/12/19 [phone]

## 2019-12-11 NOTE — Progress Notes (Addendum)
TOC CM spoke to pt's mother, Dorothy Landgrebe. States pt has Medicaid and is on disbility. She is going to schedule with her PCP, Dr Earnest Bailey. Isidoro Donning RN CCM, [facility] ED TOC CM [phone]  York Cerise, MD Affiliated with [facility] [phone]  Spoke to [facility] registration and Medicaid is not active. Mother agreeable to follow up at [facility] until his Medicaid resumes. She will follow up with his SW at [facility] tomorrow. Will call tomorrow with appt time. Isidoro Donning RN CCM, [facility] ED TOC CM [phone]

## 2019-12-11 NOTE — ED Notes (Signed)
Patient returned from MRI.

## 2019-12-11 NOTE — ED Notes (Signed)
Sent urine specimen to the main lab.

## 2019-12-12 ENCOUNTER — Inpatient Hospital Stay (HOSPITAL_COMMUNITY): Payer: Medicaid Other

## 2019-12-12 ENCOUNTER — Encounter (HOSPITAL_COMMUNITY): Payer: Self-pay | Admitting: Internal Medicine

## 2019-12-12 DIAGNOSIS — R531 Weakness: Secondary | ICD-10-CM | POA: Diagnosis present

## 2019-12-12 DIAGNOSIS — Z820 Family history of epilepsy and other diseases of the nervous system: Secondary | ICD-10-CM | POA: Diagnosis not present

## 2019-12-12 DIAGNOSIS — G379 Demyelinating disease of central nervous system, unspecified: Secondary | ICD-10-CM | POA: Diagnosis not present

## 2019-12-12 DIAGNOSIS — D72829 Elevated white blood cell count, unspecified: Secondary | ICD-10-CM | POA: Diagnosis not present

## 2019-12-12 DIAGNOSIS — G4700 Insomnia, unspecified: Secondary | ICD-10-CM | POA: Diagnosis not present

## 2019-12-12 DIAGNOSIS — M5127 Other intervertebral disc displacement, lumbosacral region: Secondary | ICD-10-CM | POA: Diagnosis not present

## 2019-12-12 DIAGNOSIS — R29898 Other symptoms and signs involving the musculoskeletal system: Secondary | ICD-10-CM | POA: Diagnosis not present

## 2019-12-12 DIAGNOSIS — H538 Other visual disturbances: Secondary | ICD-10-CM | POA: Diagnosis not present

## 2019-12-12 DIAGNOSIS — R739 Hyperglycemia, unspecified: Secondary | ICD-10-CM | POA: Diagnosis not present

## 2019-12-12 DIAGNOSIS — E876 Hypokalemia: Secondary | ICD-10-CM | POA: Diagnosis not present

## 2019-12-12 DIAGNOSIS — G35 Multiple sclerosis: Secondary | ICD-10-CM | POA: Diagnosis not present

## 2019-12-12 DIAGNOSIS — Z23 Encounter for immunization: Secondary | ICD-10-CM | POA: Diagnosis not present

## 2019-12-12 DIAGNOSIS — D72818 Other decreased white blood cell count: Secondary | ICD-10-CM | POA: Diagnosis not present

## 2019-12-12 DIAGNOSIS — Z832 Family history of diseases of the blood and blood-forming organs and certain disorders involving the immune mechanism: Secondary | ICD-10-CM | POA: Diagnosis not present

## 2019-12-12 DIAGNOSIS — H5509 Other forms of nystagmus: Secondary | ICD-10-CM | POA: Diagnosis not present

## 2019-12-12 DIAGNOSIS — I498 Other specified cardiac arrhythmias: Secondary | ICD-10-CM | POA: Diagnosis not present

## 2019-12-12 DIAGNOSIS — R296 Repeated falls: Secondary | ICD-10-CM | POA: Diagnosis not present

## 2019-12-12 DIAGNOSIS — Y92230 Patient room in hospital as the place of occurrence of the external cause: Secondary | ICD-10-CM | POA: Diagnosis not present

## 2019-12-12 DIAGNOSIS — I313 Pericardial effusion (noninflammatory): Secondary | ICD-10-CM | POA: Diagnosis not present

## 2019-12-12 DIAGNOSIS — R001 Bradycardia, unspecified: Secondary | ICD-10-CM | POA: Diagnosis not present

## 2019-12-12 DIAGNOSIS — F840 Autistic disorder: Secondary | ICD-10-CM | POA: Diagnosis not present

## 2019-12-12 DIAGNOSIS — M7989 Other specified soft tissue disorders: Secondary | ICD-10-CM | POA: Diagnosis not present

## 2019-12-12 DIAGNOSIS — Z20822 Contact with and (suspected) exposure to covid-19: Secondary | ICD-10-CM | POA: Diagnosis not present

## 2019-12-12 DIAGNOSIS — I951 Orthostatic hypotension: Secondary | ICD-10-CM | POA: Diagnosis not present

## 2019-12-12 DIAGNOSIS — Z9181 History of falling: Secondary | ICD-10-CM | POA: Diagnosis not present

## 2019-12-12 DIAGNOSIS — F329 Major depressive disorder, single episode, unspecified: Secondary | ICD-10-CM | POA: Diagnosis not present

## 2019-12-12 DIAGNOSIS — T380X5A Adverse effect of glucocorticoids and synthetic analogues, initial encounter: Secondary | ICD-10-CM | POA: Diagnosis not present

## 2019-12-12 DIAGNOSIS — K5900 Constipation, unspecified: Secondary | ICD-10-CM | POA: Diagnosis not present

## 2019-12-12 LAB — CBC WITH DIFFERENTIAL/PLATELET
Abs Immature Granulocytes: 0.08 10*3/uL — ABNORMAL HIGH (ref 0.00–0.07)
Basophils Absolute: 0 10*3/uL (ref 0.0–0.1)
Basophils Relative: 0 %
Eosinophils Absolute: 0 10*3/uL (ref 0.0–0.5)
Eosinophils Relative: 0 %
HCT: 44.3 % (ref 39.0–52.0)
Hemoglobin: 15.5 g/dL (ref 13.0–17.0)
Immature Granulocytes: 1 %
Lymphocytes Relative: 6 %
Lymphs Abs: 0.8 10*3/uL (ref 0.7–4.0)
MCH: 31.6 pg (ref 26.0–34.0)
MCHC: 35 g/dL (ref 30.0–36.0)
MCV: 90.2 fL (ref 80.0–100.0)
Monocytes Absolute: 0.1 10*3/uL (ref 0.1–1.0)
Monocytes Relative: 0 %
Neutro Abs: 11.7 10*3/uL — ABNORMAL HIGH (ref 1.7–7.7)
Neutrophils Relative %: 93 %
Platelets: 219 10*3/uL (ref 150–400)
RBC: 4.91 MIL/uL (ref 4.22–5.81)
RDW: 13 % (ref 11.5–15.5)
WBC: 12.7 10*3/uL — ABNORMAL HIGH (ref 4.0–10.5)
nRBC: 0 % (ref 0.0–0.2)

## 2019-12-12 LAB — CSF CELL COUNT WITH DIFFERENTIAL
RBC Count, CSF: 4 /mm3 — ABNORMAL HIGH
RBC Count, CSF: 58 /mm3 — ABNORMAL HIGH
Tube #: 1
Tube #: 4
WBC, CSF: 2 /mm3 (ref 0–5)
WBC, CSF: 3 /mm3 (ref 0–5)

## 2019-12-12 LAB — URINALYSIS, ROUTINE W REFLEX MICROSCOPIC
Bilirubin Urine: NEGATIVE
Glucose, UA: NEGATIVE mg/dL
Hgb urine dipstick: NEGATIVE
Ketones, ur: 5 mg/dL — AB
Leukocytes,Ua: NEGATIVE
Nitrite: NEGATIVE
Protein, ur: NEGATIVE mg/dL
Specific Gravity, Urine: 1.009 (ref 1.005–1.030)
pH: 6 (ref 5.0–8.0)

## 2019-12-12 LAB — COMPREHENSIVE METABOLIC PANEL
ALT: 16 U/L (ref 0–44)
AST: 17 U/L (ref 15–41)
Albumin: 4.4 g/dL (ref 3.5–5.0)
Alkaline Phosphatase: 41 U/L (ref 38–126)
Anion gap: 7 (ref 5–15)
BUN: 18 mg/dL (ref 6–20)
CO2: 23 mmol/L (ref 22–32)
Calcium: 9.3 mg/dL (ref 8.9–10.3)
Chloride: 108 mmol/L (ref 98–111)
Creatinine, Ser: 0.9 mg/dL (ref 0.61–1.24)
GFR calc Af Amer: >60 mL/min (ref >60)
GFR calc non Af Amer: >60 mL/min (ref >60)
Glucose, Bld: 150 mg/dL — ABNORMAL HIGH (ref 70–99)
Potassium: 3.4 mmol/L — ABNORMAL LOW (ref 3.5–5.1)
Sodium: 138 mmol/L (ref 135–145)
Total Bilirubin: 1 mg/dL (ref 0.3–1.2)
Total Protein: 7.4 g/dL (ref 6.5–8.1)

## 2019-12-12 LAB — RPR: RPR Ser Ql: NONREACTIVE

## 2019-12-12 LAB — SARS CORONAVIRUS 2 (TAT 6-24 HRS): SARS Coronavirus 2: NEGATIVE

## 2019-12-12 LAB — RAPID HIV SCREEN (HIV 1/2 AB+AG)
HIV 1/2 Antibodies: NONREACTIVE
HIV-1 P24 Antigen - HIV24: NONREACTIVE

## 2019-12-12 LAB — GLUCOSE, CAPILLARY: Glucose-Capillary: 146 mg/dL — ABNORMAL HIGH (ref 70–99)

## 2019-12-12 LAB — PROTEIN, CSF: Total  Protein, CSF: 33 mg/dL (ref 15–45)

## 2019-12-12 LAB — MRSA PCR SCREENING: MRSA by PCR: NEGATIVE

## 2019-12-12 LAB — GLUCOSE, CSF: Glucose, CSF: 57 mg/dL (ref 40–70)

## 2019-12-12 MED ORDER — POTASSIUM CHLORIDE CRYS ER 20 MEQ PO TBCR
20.0000 meq | EXTENDED_RELEASE_TABLET | Freq: Once | ORAL | Status: AC
  Administered 2019-12-12: 20 meq via ORAL
  Filled 2019-12-12: qty 1

## 2019-12-12 MED ORDER — ONDANSETRON HCL 4 MG/2ML IJ SOLN: 4.0000 mg | Freq: Four times a day (QID) | INTRAMUSCULAR | Status: DC | PRN

## 2019-12-12 MED ORDER — ACETAMINOPHEN 650 MG RE SUPP: 650.0000 mg | Freq: Four times a day (QID) | RECTAL | Status: DC | PRN

## 2019-12-12 MED ORDER — ONDANSETRON HCL 4 MG PO TABS: 4.0000 mg | ORAL_TABLET | Freq: Four times a day (QID) | ORAL | Status: DC | PRN

## 2019-12-12 MED ORDER — SODIUM CHLORIDE 0.9 % IV SOLN
1000.0000 mg | INTRAVENOUS | Status: AC
  Administered 2019-12-12 – 2019-12-15 (×4): 1000 mg via INTRAVENOUS
  Filled 2019-12-12 (×4): qty 8

## 2019-12-12 MED ORDER — ACETAMINOPHEN 325 MG PO TABS: 650.0000 mg | ORAL_TABLET | Freq: Four times a day (QID) | ORAL | Status: DC | PRN

## 2019-12-12 MED ORDER — PANTOPRAZOLE SODIUM 40 MG PO TBEC
40.0000 mg | DELAYED_RELEASE_TABLET | Freq: Every day | ORAL | Status: DC
  Administered 2019-12-12 – 2019-12-17 (×6): 40 mg via ORAL
  Filled 2019-12-12 (×6): qty 1

## 2019-12-12 NOTE — Progress Notes (Signed)
PT Cancellation Note  Patient Details Name: Jaime [name] MRN: [id number] DOB: 12-07-1995   Cancelled Treatment:    Reason Eval/Treat Not Completed: Other (comment)Patient transferred to [facility].    Rada Hay 12/12/2019, 9:53 AM  Blanchard Kelch PT Acute Rehabilitation Services Pager [phone] Office [phone]

## 2019-12-12 NOTE — ED Notes (Signed)
Pt assisted with urinal, per mothers request. Pt had no difficulty using urinal. of urine voided.

## 2019-12-12 NOTE — H&P (Signed)
History and Physical    Jaime [name] EXH:[id number] DOB: [birth year] DOA: 12/11/2019  PCP: Patient, No Pcp Per  Patient coming from: Home.  Chief Complaint: Weakness and falls.  History obtained from patient's mother and patient and ER physician.  HPI: Jaime [name] is a 24 y.o. male with history of autism presently not on any medication has been experiencing increasing weakness and difficulty ambulating over the last 3 months.  Which has progressively worsened.  Yesterday patient had 3 falls while walking his dog.  He hit his head in the process but did not lose consciousness.  [name] states that he has been stumbling while walking and started to walk like a toddler.  Over the last few weeks patient also has been having blurred vision.  Not sure if he had incontinent of urine but he did wet his pants once.  Given the symptoms patient was brought to the ER.  Per patient's mother patient had prolonged respiratory tract infection in the month of January 2 months ago during which patient had persistent productive cough.  Just took Mucinex at that time.  ED Course: In the ER patient is able to move all extremities.  MRI brain C-spine T-spine and L-spine were done which shows diffuse demyelinating lesions for which neurologist on-call was consulted.  Neurology started patient on IV Solu-Medrol 1 g daily for 5 days.  Lumbar puncture was done and labs are pending.  B12 level was normal.  EKG shows normal sinus rhythm.  Potassium 3.4.  Covid test is pending.  Review of Systems: As per HPI, rest all negative.   Past Medical History:  Diagnosis Date  . Autism   . Depression     History reviewed. No pertinent surgical history.   reports that he has never smoked. He has never used smokeless tobacco. He reports that he does not drink alcohol or use drugs.  No Known Allergies  Family History  Problem Relation Age of Onset  . Clotting disorder Mother     Prior to Admission  medications   Not on File    Physical Exam: Constitutional: Moderately built and nourished. Vitals:   12/12/19 0215 12/12/19 0230 12/12/19 0245 12/12/19 0315  BP: 105/62 112/72 106/64 103/64  Pulse: (!) 56 63 66 (!) 57  Resp:    17  Temp:      TempSrc:      SpO2: 96% 97% 99% 97%  Weight:      Height:       Eyes: Anicteric no pallor. ENMT: No discharge from the ears eyes nose or mouth. Neck: No mass felt.  No neck rigidity. Respiratory: No rhonchi or crepitations. Cardiovascular: S1-S2 heard. Abdomen: Soft nontender bowel sounds present. Musculoskeletal: No edema. Skin: No rash. Neurologic: Alert awake oriented to time place and person.  Moves all extremities 5 x 5.  No facial asymmetry tongue is midline.  Pupils equal and reacting to light. Psychiatric: Oriented to place and person.   Labs on Admission: I have personally reviewed following labs and imaging studies  CBC: Recent Labs  Lab 12/11/19 2110  WBC 10.1  NEUTROABS 7.5  HGB 14.9  HCT 43.3  MCV 91.4  PLT 381   Basic Metabolic Panel: Recent Labs  Lab 12/11/19 2110  NA 141  K 3.4*  CL 109  CO2 23  GLUCOSE 89  BUN 13  CREATININE 0.80  CALCIUM 9.2   GFR: Estimated Creatinine Clearance: 110.5 mL/min (by C-G formula based on SCr of 0.8  mg/dL). Liver Function Tests: No results for input(s): AST, ALT, ALKPHOS, BILITOT, PROT, ALBUMIN in the last 168 hours. No results for input(s): LIPASE, AMYLASE in the last 168 hours. No results for input(s): AMMONIA in the last 168 hours. Coagulation Profile: No results for input(s): INR, PROTIME in the last 168 hours. Cardiac Enzymes: No results for input(s): CKTOTAL, CKMB, CKMBINDEX, TROPONINI in the last 168 hours. BNP (last 3 results) No results for input(s): PROBNP in the last 8760 hours. HbA1C: No results for input(s): HGBA1C in the last 72 hours. CBG: No results for input(s): GLUCAP in the last 168 hours. Lipid Profile: No results for input(s): CHOL, HDL,  LDLCALC, TRIG, CHOLHDL, LDLDIRECT in the last 72 hours. Thyroid Function Tests: No results for input(s): TSH, T4TOTAL, FREET4, T3FREE, THYROIDAB in the last 72 hours. Anemia Panel: Recent Labs    12/11/19 2110  VITAMINB12 678   Urine analysis:    Component Value Date/Time   COLORURINE YELLOW 10/03/2015 1501   APPEARANCEUR CLEAR 10/03/2015 1501   LABSPEC 1.003 (L) 10/03/2015 1501   PHURINE 6.5 10/03/2015 1501   GLUCOSEU NEGATIVE 10/03/2015 1501   HGBUR NEGATIVE 10/03/2015 1501   BILIRUBINUR NEGATIVE 10/03/2015 1501   KETONESUR NEGATIVE 10/03/2015 1501   PROTEINUR NEGATIVE 10/03/2015 1501   NITRITE NEGATIVE 10/03/2015 1501   LEUKOCYTESUR NEGATIVE 10/03/2015 1501   Sepsis Labs: @LABRCNTIP (procalcitonin:4,lacticidven:4) ) Recent Results (from the past 240 hour(s))  CSF culture     Status: None (Preliminary result)   Collection Time: 12/12/19 12:02 AM   Specimen: CSF; Cerebrospinal Fluid  Result Value Ref Range Status   Specimen Description CSF  Final   Special Requests NONE  Final   Gram Stain   Final    FEW WBC PRESENT, PREDOMINANTLY MONONUCLEAR NO ORGANISMS SEEN RESULT CALLED TO, READ BACK BY AND VERIFIED WITH: RN KATIE AT 0132 12/12/19 CRUICKSHANK A Performed at [facility], [address], [place], [place] [address]    Culture PENDING  Incomplete   Report Status PENDING  Incomplete     Radiological Exams on Admission: MR Brain W and Wo Contrast  Result Date: 12/11/2019 CLINICAL DATA:  Initial evaluation for progressive ataxia, concern for stroke, multiple sclerosis. EXAM: MRI HEAD WITHOUT AND WITH CONTRAST MRI CERVICAL, THORACIC, AND LUMBAR SPINE WITHOUT AND WITH CONTRAST TECHNIQUE: Multisequence MR imaging of the spine from the cervical spine to the sacrum was performed prior to and following IV contrast administration for evaluation of spinal metastatic disease. CONTRAST:  49mL GADAVIST GADOBUTROL 1 MMOL/ML IV SOLN COMPARISON:  None available.  FINDINGS: MRI HEAD SPINE FINDINGS Brain: Cerebral volume within normal limits for age. Innumerable scattered foci of T2/FLAIR hyperintensity seen involving the periventricular, deep, and juxta cortical white matter of both cerebral hemispheres. Many of these foci are oriented perpendicular to the lateral ventricles. Extensive involvement of the deep gray nuclei, brainstem, and cerebellar peduncles. Associated multiple scattered T1 black holes. For reference purposes, largest focus seen at the posterior left centrum semi ovale and measures approximately 2 cm. 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 Extensive enhancement seen about multiple lesions following contrast administration, compatible with active demyelination. No evidence for acute or subacute infarct. No encephalomalacia to suggest chronic infarction. No evidence for acute or chronic intracranial hemorrhage. No mass lesion or mass effect. No hydrocephalus. No extra-axial fluid collection. Pituitary gland suprasellar region normal. Midline structures intact. Vascular: Major intracranial vascular flow voids are maintained. Skull and upper cervical spine: Craniocervical junction normal. Bone marrow signal intensity within normal limits. No  scalp soft tissue abnormality. Sinuses/Orbits: Globes and orbital soft tissues within normal limits. Scattered mucosal thickening noted throughout the paranasal sinuses, with small volume layering fluid within the lateral recess of the left sphenoid sinus. No mastoid effusion. Inner ear structures grossly normal. Other: None. MRI CERVICAL SPINE FINDINGS Alignment: Straightening of the normal cervical lordosis. No listhesis. Vertebrae: Vertebral body height maintained without evidence for acute or chronic fracture. Bone marrow signal intensity normal. No discrete or worrisome osseous lesions. No abnormal marrow edema or enhancement. Cord: Extensive signal abnormality seen involving the majority of  the cervical spine, extending from the cervicomedullary junction through the upper thoracic spine, compatible with demyelinating disease/multiple sclerosis. For reference purposes, most prominent of these lesions involves the right dorsal cord at the level of C2 (series 7, image 3). No definite associated enhancement to suggest active demyelination, although evaluation somewhat limited by motion artifact. Underlying cord is somewhat atrophic in appearance. Posterior Fossa, vertebral arteries, paraspinal tissues: Unremarkable. Disc levels: No significant disc pathology seen within the cervical spine. No significant facet degeneration. No canal or neural foraminal stenosis. No impingement. MRI THORACIC SPINE FINDINGS Alignment: Physiologic with preservation of the normal thoracic kyphosis. No listhesis. Vertebrae: Vertebral body height maintained without evidence for acute or chronic fracture. Bone marrow signal intensity within normal limits. No discrete or worrisome osseous lesions. No abnormal marrow edema or enhancement. Cord: Extensive signal abnormality seen throughout the thoracic spinal cord, essentially involving nearly all levels. Finding consistent with demyelinating disease. Patchy postcontrast enhancement involving the cord at the level of T1-2 consistent with active demyelination (series 32, image 8). Suspected additional faint patchy post-contrast enhancement at the level of T10-11 (series 32, image 8). Underlying cord is mildly atrophic in appearance. Paraspinal and other soft tissues: Unremarkable. Disc levels: T6-7: Small central disc protrusion indents the ventral thecal sac, contacting and minimally flattening the ventral cord. No significant stenosis. No other significant disc pathology seen within the thoracic spine. No other stenosis or neural impingement. MRI LUMBAR SPINE FINDINGS Segmentation:  Standard. Alignment:  Physiologic. Vertebrae: Vertebral body height maintained without evidence for  acute or chronic fracture. Bone marrow signal intensity normal. No discrete or worrisome osseous lesions. No abnormal marrow edema or enhancement. Conus medullaris: Extends to the L1-2 level. Patchy signal abnormality seen within the distal cord, consistent with demyelinating disease. Finding better evaluated on concomitant thoracic spine MRI. No appreciable abnormal enhancement. Nerve roots of the cauda equina within normal limits. Paraspinal and other soft tissues: Unremarkable. Disc levels: L1-2:  Unremarkable. L2-3:  Unremarkable. L3-4:  Unremarkable. L4-5:  Unremarkable. L5-S1: Tiny central disc protrusion with slight inferior migration. No significant spinal stenosis or neural impingement. Foramina remain patent. IMPRESSION: MRI HEAD IMPRESSION: Extensive T2/FLAIR signal abnormality scattered throughout the supratentorial and infratentorial brain, consistent with demyelinating disease/multiple sclerosis. Associated enhancement about numerous lesions, consistent with active demyelination. MRI CERVICAL SPINE IMPRESSION: Extensive signal abnormality involving essentially the majority of the cervical spinal cord, consistent with demyelinating disease. No abnormal enhancement to suggest active demyelination. MRI THORACIC SPINE IMPRESSION: 1. Extensive signal abnormality involving essentially the entirety of the thoracic spinal cord, consistent with demyelinating disease. Patchy postcontrast enhancement at the level of T1-2 and T10-11 consistent with active demyelination. 2. Small central disc protrusion at T6-7 without significant stenosis. MRI LUMBAR SPINE IMPRESSION: 1. Patchy signal abnormality within the visualized distal cord, consistent with demyelinating disease. No abnormal enhancement to suggest active demyelination. 2. Tiny central disc protrusion at L5-S1 without stenosis or impingement. Electronically Signed   By:  Rise Mu M.D.   On: 12/11/2019 22:35   MR CERVICAL SPINE W WO  CONTRAST  Result Date: 12/11/2019 CLINICAL DATA:  Initial evaluation for progressive ataxia, concern for stroke, multiple sclerosis. EXAM: MRI HEAD WITHOUT AND WITH CONTRAST MRI CERVICAL, THORACIC, AND LUMBAR SPINE WITHOUT AND WITH CONTRAST TECHNIQUE: Multisequence MR imaging of the spine from the cervical spine to the sacrum was performed prior to and following IV contrast administration for evaluation of spinal metastatic disease. CONTRAST:  5mL GADAVIST GADOBUTROL 1 MMOL/ML IV SOLN COMPARISON:  None available. FINDINGS: MRI HEAD SPINE FINDINGS Brain: Cerebral volume within normal limits for age. Innumerable scattered foci of T2/FLAIR hyperintensity seen involving the periventricular, deep, and juxta cortical white matter of both cerebral hemispheres. Many of these foci are oriented perpendicular to the lateral ventricles. Extensive involvement of the deep gray nuclei, brainstem, and cerebellar peduncles. Associated multiple scattered T1 black holes. For reference purposes, largest focus seen at the posterior left centrum semi ovale and measures approximately 2 cm. 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 Extensive enhancement seen about multiple lesions following contrast administration, compatible with active demyelination. No evidence for acute or subacute infarct. No encephalomalacia to suggest chronic infarction. No evidence for acute or chronic intracranial hemorrhage. No mass lesion or mass effect. No hydrocephalus. No extra-axial fluid collection. Pituitary gland suprasellar region normal. Midline structures intact. Vascular: Major intracranial vascular flow voids are maintained. Skull and upper cervical spine: Craniocervical junction normal. Bone marrow signal intensity within normal limits. No scalp soft tissue abnormality. Sinuses/Orbits: Globes and orbital soft tissues within normal limits. Scattered mucosal thickening noted throughout the paranasal sinuses, with small volume  layering fluid within the lateral recess of the left sphenoid sinus. No mastoid effusion. Inner ear structures grossly normal. Other: None. MRI CERVICAL SPINE FINDINGS Alignment: Straightening of the normal cervical lordosis. No listhesis. Vertebrae: Vertebral body height maintained without evidence for acute or chronic fracture. Bone marrow signal intensity normal. No discrete or worrisome osseous lesions. No abnormal marrow edema or enhancement. Cord: Extensive signal abnormality seen involving the majority of the cervical spine, extending from the cervicomedullary junction through the upper thoracic spine, compatible with demyelinating disease/multiple sclerosis. For reference purposes, most prominent of these lesions involves the right dorsal cord at the level of C2 (series 7, image 3). No definite associated enhancement to suggest active demyelination, although evaluation somewhat limited by motion artifact. Underlying cord is somewhat atrophic in appearance. Posterior Fossa, vertebral arteries, paraspinal tissues: Unremarkable. Disc levels: No significant disc pathology seen within the cervical spine. No significant facet degeneration. No canal or neural foraminal stenosis. No impingement. MRI THORACIC SPINE FINDINGS Alignment: Physiologic with preservation of the normal thoracic kyphosis. No listhesis. Vertebrae: Vertebral body height maintained without evidence for acute or chronic fracture. Bone marrow signal intensity within normal limits. No discrete or worrisome osseous lesions. No abnormal marrow edema or enhancement. Cord: Extensive signal abnormality seen throughout the thoracic spinal cord, essentially involving nearly all levels. Finding consistent with demyelinating disease. Patchy postcontrast enhancement involving the cord at the level of T1-2 consistent with active demyelination (series 32, image 8). Suspected additional faint patchy post-contrast enhancement at the level of T10-11 (series 32,  image 8). Underlying cord is mildly atrophic in appearance. Paraspinal and other soft tissues: Unremarkable. Disc levels: T6-7: Small central disc protrusion indents the ventral thecal sac, contacting and minimally flattening the ventral cord. No significant stenosis. No other significant disc pathology seen within the thoracic spine. No other stenosis or neural impingement. MRI LUMBAR  SPINE FINDINGS Segmentation:  Standard. Alignment:  Physiologic. Vertebrae: Vertebral body height maintained without evidence for acute or chronic fracture. Bone marrow signal intensity normal. No discrete or worrisome osseous lesions. No abnormal marrow edema or enhancement. Conus medullaris: Extends to the L1-2 level. Patchy signal abnormality seen within the distal cord, consistent with demyelinating disease. Finding better evaluated on concomitant thoracic spine MRI. No appreciable abnormal enhancement. Nerve roots of the cauda equina within normal limits. Paraspinal and other soft tissues: Unremarkable. Disc levels: L1-2:  Unremarkable. L2-3:  Unremarkable. L3-4:  Unremarkable. L4-5:  Unremarkable. L5-S1: Tiny central disc protrusion with slight inferior migration. No significant spinal stenosis or neural impingement. Foramina remain patent. IMPRESSION: MRI HEAD IMPRESSION: Extensive T2/FLAIR signal abnormality scattered throughout the supratentorial and infratentorial brain, consistent with demyelinating disease/multiple sclerosis. Associated enhancement about numerous lesions, consistent with active demyelination. MRI CERVICAL SPINE IMPRESSION: Extensive signal abnormality involving essentially the majority of the cervical spinal cord, consistent with demyelinating disease. No abnormal enhancement to suggest active demyelination. MRI THORACIC SPINE IMPRESSION: 1. Extensive signal abnormality involving essentially the entirety of the thoracic spinal cord, consistent with demyelinating disease. Patchy postcontrast enhancement at  the level of T1-2 and T10-11 consistent with active demyelination. 2. Small central disc protrusion at T6-7 without significant stenosis. MRI LUMBAR SPINE IMPRESSION: 1. Patchy signal abnormality within the visualized distal cord, consistent with demyelinating disease. No abnormal enhancement to suggest active demyelination. 2. Tiny central disc protrusion at L5-S1 without stenosis or impingement. Electronically Signed   By: Rise Mu M.D.   On: 12/11/2019 22:35   MR THORACIC SPINE W WO CONTRAST  Result Date: 12/11/2019 CLINICAL DATA:  Initial evaluation for progressive ataxia, concern for stroke, multiple sclerosis. EXAM: MRI HEAD WITHOUT AND WITH CONTRAST MRI CERVICAL, THORACIC, AND LUMBAR SPINE WITHOUT AND WITH CONTRAST TECHNIQUE: Multisequence MR imaging of the spine from the cervical spine to the sacrum was performed prior to and following IV contrast administration for evaluation of spinal metastatic disease. CONTRAST:  36mL GADAVIST GADOBUTROL 1 MMOL/ML IV SOLN COMPARISON:  None available. FINDINGS: MRI HEAD SPINE FINDINGS Brain: Cerebral volume within normal limits for age. Innumerable scattered foci of T2/FLAIR hyperintensity seen involving the periventricular, deep, and juxta cortical white matter of both cerebral hemispheres. Many of these foci are oriented perpendicular to the lateral ventricles. Extensive involvement of the deep gray nuclei, brainstem, and cerebellar peduncles. Associated multiple scattered T1 black holes. For reference purposes, largest focus seen at the posterior left centrum semi ovale and measures approximately 2 cm. 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 Extensive enhancement seen about multiple lesions following contrast administration, compatible with active demyelination. No evidence for acute or subacute infarct. No encephalomalacia to suggest chronic infarction. No evidence for acute or chronic intracranial hemorrhage. No mass lesion or  mass effect. No hydrocephalus. No extra-axial fluid collection. Pituitary gland suprasellar region normal. Midline structures intact. Vascular: Major intracranial vascular flow voids are maintained. Skull and upper cervical spine: Craniocervical junction normal. Bone marrow signal intensity within normal limits. No scalp soft tissue abnormality. Sinuses/Orbits: Globes and orbital soft tissues within normal limits. Scattered mucosal thickening noted throughout the paranasal sinuses, with small volume layering fluid within the lateral recess of the left sphenoid sinus. No mastoid effusion. Inner ear structures grossly normal. Other: None. MRI CERVICAL SPINE FINDINGS Alignment: Straightening of the normal cervical lordosis. No listhesis. Vertebrae: Vertebral body height maintained without evidence for acute or chronic fracture. Bone marrow signal intensity normal. No discrete or worrisome osseous lesions. No abnormal marrow edema  or enhancement. Cord: Extensive signal abnormality seen involving the majority of the cervical spine, extending from the cervicomedullary junction through the upper thoracic spine, compatible with demyelinating disease/multiple sclerosis. For reference purposes, most prominent of these lesions involves the right dorsal cord at the level of C2 (series 7, image 3). No definite associated enhancement to suggest active demyelination, although evaluation somewhat limited by motion artifact. Underlying cord is somewhat atrophic in appearance. Posterior Fossa, vertebral arteries, paraspinal tissues: Unremarkable. Disc levels: No significant disc pathology seen within the cervical spine. No significant facet degeneration. No canal or neural foraminal stenosis. No impingement. MRI THORACIC SPINE FINDINGS Alignment: Physiologic with preservation of the normal thoracic kyphosis. No listhesis. Vertebrae: Vertebral body height maintained without evidence for acute or chronic fracture. Bone marrow signal  intensity within normal limits. No discrete or worrisome osseous lesions. No abnormal marrow edema or enhancement. Cord: Extensive signal abnormality seen throughout the thoracic spinal cord, essentially involving nearly all levels. Finding consistent with demyelinating disease. Patchy postcontrast enhancement involving the cord at the level of T1-2 consistent with active demyelination (series 32, image 8). Suspected additional faint patchy post-contrast enhancement at the level of T10-11 (series 32, image 8). Underlying cord is mildly atrophic in appearance. Paraspinal and other soft tissues: Unremarkable. Disc levels: T6-7: Small central disc protrusion indents the ventral thecal sac, contacting and minimally flattening the ventral cord. No significant stenosis. No other significant disc pathology seen within the thoracic spine. No other stenosis or neural impingement. MRI LUMBAR SPINE FINDINGS Segmentation:  Standard. Alignment:  Physiologic. Vertebrae: Vertebral body height maintained without evidence for acute or chronic fracture. Bone marrow signal intensity normal. No discrete or worrisome osseous lesions. No abnormal marrow edema or enhancement. Conus medullaris: Extends to the L1-2 level. Patchy signal abnormality seen within the distal cord, consistent with demyelinating disease. Finding better evaluated on concomitant thoracic spine MRI. No appreciable abnormal enhancement. Nerve roots of the cauda equina within normal limits. Paraspinal and other soft tissues: Unremarkable. Disc levels: L1-2:  Unremarkable. L2-3:  Unremarkable. L3-4:  Unremarkable. L4-5:  Unremarkable. L5-S1: Tiny central disc protrusion with slight inferior migration. No significant spinal stenosis or neural impingement. Foramina remain patent. IMPRESSION: MRI HEAD IMPRESSION: Extensive T2/FLAIR signal abnormality scattered throughout the supratentorial and infratentorial brain, consistent with demyelinating disease/multiple sclerosis.  Associated enhancement about numerous lesions, consistent with active demyelination. MRI CERVICAL SPINE IMPRESSION: Extensive signal abnormality involving essentially the majority of the cervical spinal cord, consistent with demyelinating disease. No abnormal enhancement to suggest active demyelination. MRI THORACIC SPINE IMPRESSION: 1. Extensive signal abnormality involving essentially the entirety of the thoracic spinal cord, consistent with demyelinating disease. Patchy postcontrast enhancement at the level of T1-2 and T10-11 consistent with active demyelination. 2. Small central disc protrusion at T6-7 without significant stenosis. MRI LUMBAR SPINE IMPRESSION: 1. Patchy signal abnormality within the visualized distal cord, consistent with demyelinating disease. No abnormal enhancement to suggest active demyelination. 2. Tiny central disc protrusion at L5-S1 without stenosis or impingement. Electronically Signed   By: Rise Mu M.D.   On: 12/11/2019 22:35   MR Lumbar Spine W Wo Contrast  Result Date: 12/11/2019 CLINICAL DATA:  Initial evaluation for progressive ataxia, concern for stroke, multiple sclerosis. EXAM: MRI HEAD WITHOUT AND WITH CONTRAST MRI CERVICAL, THORACIC, AND LUMBAR SPINE WITHOUT AND WITH CONTRAST TECHNIQUE: Multisequence MR imaging of the spine from the cervical spine to the sacrum was performed prior to and following IV contrast administration for evaluation of spinal metastatic disease. CONTRAST:  28mL GADAVIST GADOBUTROL  1 MMOL/ML IV SOLN COMPARISON:  None available. FINDINGS: MRI HEAD SPINE FINDINGS Brain: Cerebral volume within normal limits for age. Innumerable scattered foci of T2/FLAIR hyperintensity seen involving the periventricular, deep, and juxta cortical white matter of both cerebral hemispheres. Many of these foci are oriented perpendicular to the lateral ventricles. Extensive involvement of the deep gray nuclei, brainstem, and cerebellar peduncles. Associated  multiple scattered T1 black holes. For reference purposes, largest focus seen at the posterior left centrum semi ovale and measures approximately 2 cm. 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 Extensive enhancement seen about multiple lesions following contrast administration, compatible with active demyelination. No evidence for acute or subacute infarct. No encephalomalacia to suggest chronic infarction. No evidence for acute or chronic intracranial hemorrhage. No mass lesion or mass effect. No hydrocephalus. No extra-axial fluid collection. Pituitary gland suprasellar region normal. Midline structures intact. Vascular: Major intracranial vascular flow voids are maintained. Skull and upper cervical spine: Craniocervical junction normal. Bone marrow signal intensity within normal limits. No scalp soft tissue abnormality. Sinuses/Orbits: Globes and orbital soft tissues within normal limits. Scattered mucosal thickening noted throughout the paranasal sinuses, with small volume layering fluid within the lateral recess of the left sphenoid sinus. No mastoid effusion. Inner ear structures grossly normal. Other: None. MRI CERVICAL SPINE FINDINGS Alignment: Straightening of the normal cervical lordosis. No listhesis. Vertebrae: Vertebral body height maintained without evidence for acute or chronic fracture. Bone marrow signal intensity normal. No discrete or worrisome osseous lesions. No abnormal marrow edema or enhancement. Cord: Extensive signal abnormality seen involving the majority of the cervical spine, extending from the cervicomedullary junction through the upper thoracic spine, compatible with demyelinating disease/multiple sclerosis. For reference purposes, most prominent of these lesions involves the right dorsal cord at the level of C2 (series 7, image 3). No definite associated enhancement to suggest active demyelination, although evaluation somewhat limited by motion artifact.  Underlying cord is somewhat atrophic in appearance. Posterior Fossa, vertebral arteries, paraspinal tissues: Unremarkable. Disc levels: No significant disc pathology seen within the cervical spine. No significant facet degeneration. No canal or neural foraminal stenosis. No impingement. MRI THORACIC SPINE FINDINGS Alignment: Physiologic with preservation of the normal thoracic kyphosis. No listhesis. Vertebrae: Vertebral body height maintained without evidence for acute or chronic fracture. Bone marrow signal intensity within normal limits. No discrete or worrisome osseous lesions. No abnormal marrow edema or enhancement. Cord: Extensive signal abnormality seen throughout the thoracic spinal cord, essentially involving nearly all levels. Finding consistent with demyelinating disease. Patchy postcontrast enhancement involving the cord at the level of T1-2 consistent with active demyelination (series 32, image 8). Suspected additional faint patchy post-contrast enhancement at the level of T10-11 (series 32, image 8). Underlying cord is mildly atrophic in appearance. Paraspinal and other soft tissues: Unremarkable. Disc levels: T6-7: Small central disc protrusion indents the ventral thecal sac, contacting and minimally flattening the ventral cord. No significant stenosis. No other significant disc pathology seen within the thoracic spine. No other stenosis or neural impingement. MRI LUMBAR SPINE FINDINGS Segmentation:  Standard. Alignment:  Physiologic. Vertebrae: Vertebral body height maintained without evidence for acute or chronic fracture. Bone marrow signal intensity normal. No discrete or worrisome osseous lesions. No abnormal marrow edema or enhancement. Conus medullaris: Extends to the L1-2 level. Patchy signal abnormality seen within the distal cord, consistent with demyelinating disease. Finding better evaluated on concomitant thoracic spine MRI. No appreciable abnormal enhancement. Nerve roots of the cauda  equina within normal limits. Paraspinal and other soft tissues: Unremarkable. Disc levels:  L1-2:  Unremarkable. L2-3:  Unremarkable. L3-4:  Unremarkable. L4-5:  Unremarkable. L5-S1: Tiny central disc protrusion with slight inferior migration. No significant spinal stenosis or neural impingement. Foramina remain patent. IMPRESSION: MRI HEAD IMPRESSION: Extensive T2/FLAIR signal abnormality scattered throughout the supratentorial and infratentorial brain, consistent with demyelinating disease/multiple sclerosis. Associated enhancement about numerous lesions, consistent with active demyelination. MRI CERVICAL SPINE IMPRESSION: Extensive signal abnormality involving essentially the majority of the cervical spinal cord, consistent with demyelinating disease. No abnormal enhancement to suggest active demyelination. MRI THORACIC SPINE IMPRESSION: 1. Extensive signal abnormality involving essentially the entirety of the thoracic spinal cord, consistent with demyelinating disease. Patchy postcontrast enhancement at the level of T1-2 and T10-11 consistent with active demyelination. 2. Small central disc protrusion at T6-7 without significant stenosis. MRI LUMBAR SPINE IMPRESSION: 1. Patchy signal abnormality within the visualized distal cord, consistent with demyelinating disease. No abnormal enhancement to suggest active demyelination. 2. Tiny central disc protrusion at L5-S1 without stenosis or impingement. Electronically Signed   By: Rise Mu M.D.   On: 12/11/2019 22:35    EKG: Independently reviewed.  Normal sinus rhythm.  Assessment/Plan Principal Problem:   Demyelinating disease of central nervous system (HCC) Active Problems:   Lower extremity weakness    1. 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  Other differentials includes neuromyelitis optica neurosarcoid and CNS lymphomatous process.  Patient has been started on IV Solu-Medrol 1 g daily for 5  days.  Follow-up CSF labs.  Check ACE levels with next lab draw. 2. History of autism presently not on any medication. 3. Mild hypokalemia replace and recheck.  Given that patient has extensive edema limitations will need close monitoring for any deterioration and will need inpatient status.  Covid test chest x-ray and UA are pending.   DVT prophylaxis: SCDs for now since patient just had lumbar puncture.  May start Lovenox after few hours. Code Status: Full code. Family Communication: [name]. Disposition Plan: Home. Consults called: Neurology. Admission status: Inpatient.   Eduard Clos MD [facility] Pager [phone].  If 7PM-7AM, please contact night-coverage [email or web address] Password [facility]  12/12/2019, 3:53 AM

## 2019-12-12 NOTE — Plan of Care (Signed)
  Problem: Education: Goal: Knowledge of General Education information will improve Description: Including pain rating scale, medication(s)/side effects and non-pharmacologic comfort measures Outcome: Progressing   Problem: Clinical Measurements: Goal: Will remain free from infection Outcome: Progressing Goal: Diagnostic test results will improve Outcome: Progressing   Problem: Pain Managment: Goal: General experience of comfort will improve Outcome: Completed/Met   Problem: Safety: Goal: Ability to remain free from injury will improve Outcome: Not Progressing

## 2019-12-12 NOTE — Progress Notes (Signed)
Care started prior to midnight in the emergency room and patient was admitted after midnight by my partner and colleague Dr. Midge Minium and I am in current agreement with his assessment and plan.  Additional changes of plan of care been made accordingly.  The patient is a 24 year old with a past medical history significant for autism currently not on any medication who started experience weakness and difficulty ambulating over the last 2 months.  This is progressively worsened and he had 3 falls while walking his dog and he did hit his head in the process did not lose consciousness.  Mother had stated that he had been stumbling while walking and started to walk like a toddler and over the last few weeks patient had blurred vision.  He was brought to the emergency room for further evaluation and per the patient's mother he had a prolonged respiratory tract infection 2 months ago in January during which she did have a persistent productive cough.  He had an MRI of the C-spine, T-spine, and L-spine ordered and showed diffuse demyelinating lesions.  Neurology was consulted and recommended the patient be started on Solu-Medrol 1 g daily for 5 days.  Lumbar puncture was done as well and patient was transferred to [facility] for further evaluation.  Currently he is being evaluated and treated for the following but not limited to:  Demyelinating disease of the central nervous system concerning for Multiple Sclerosis.   -Other differentials includes neuromyelitis optica neurosarcoid and CNS lymphomatous process. -Patient has been started on IV Solu-Medrol 1 g daily for 5 days.   -Follow-up CSF labs.   -Check ACE levels with next lab draw -Neurology consulted for further evaluation and recommendations and appreciate further assistance in management -Obtain PT and OT to evaluate and treat -Neurology recommends possibly extending Solu-Medrol for 2 more days after the 5-day dosing based on clinical  response -They also recommend considering plasma exchange if clinically appropriate -He will need to follow-up with Dr. Despina Arias at discharge -Per neurology he may be a candidate for Tysabri or previous-we will be checking a urinalysis and chest x-ray to make sure there is no evidence of infection as he is currently now on steroids  -We will add Protonix PPI for GI prophylaxis -Neurology recommended following up serum ACE and CSF ACE levels  History of Autism  -Presently not on any medication.  Hypokalemia  -His potassium level this morning was 3.4  -Replete with p.o. potassium chloride 20 mEq x 1 -Continue to monitor and replete as necessary and will also check magnesium level in a.m. -Repeat CMP in a.m.  Leukocytosis -Likely in the setting of steroid demargination as WBC went from 10-12.7 -Continue monitor and trend -Repeat CBC in a.m.   Hyperglycemia -In the setting of steroid demargination -Patient's blood sugar on admission was normal at 89 on his BMP and repeat CMP this morning showed a blood glucose of 150 -Continue to monitor and trend blood sugars carefully and if necessary will place on symptoms NovoLog sliding scale insulin AC -May consider obtaining a hemoglobin A1c to rule out diabetes component  We will continue to monitor patient's clinical response to intervention and repeat blood work in the a.m. and follow-up on Neurology recommendations.

## 2019-12-12 NOTE — Progress Notes (Signed)
TOC CM arranged appt with [facility] on 12/28/2019 at 2:30 pm. Notified mother of appt time. Isidoro Donning RN CCM, [facility] ED TOC CM [phone]

## 2019-12-13 DIAGNOSIS — R29898 Other symptoms and signs involving the musculoskeletal system: Secondary | ICD-10-CM

## 2019-12-13 DIAGNOSIS — D72829 Elevated white blood cell count, unspecified: Secondary | ICD-10-CM

## 2019-12-13 DIAGNOSIS — G379 Demyelinating disease of central nervous system, unspecified: Secondary | ICD-10-CM

## 2019-12-13 DIAGNOSIS — G35 Multiple sclerosis: Principal | ICD-10-CM

## 2019-12-13 DIAGNOSIS — R739 Hyperglycemia, unspecified: Secondary | ICD-10-CM

## 2019-12-13 LAB — CBC WITH DIFFERENTIAL/PLATELET
Abs Immature Granulocytes: 0 10*3/uL (ref 0.00–0.07)
Basophils Absolute: 0 10*3/uL (ref 0.0–0.1)
Basophils Relative: 0 %
Eosinophils Absolute: 0 10*3/uL (ref 0.0–0.5)
Eosinophils Relative: 0 %
HCT: 44.8 % (ref 39.0–52.0)
Hemoglobin: 15.4 g/dL (ref 13.0–17.0)
Lymphocytes Relative: 2 %
Lymphs Abs: 0.5 10*3/uL — ABNORMAL LOW (ref 0.7–4.0)
MCH: 31.4 pg (ref 26.0–34.0)
MCHC: 34.4 g/dL (ref 30.0–36.0)
MCV: 91.4 fL (ref 80.0–100.0)
Monocytes Absolute: 1 10*3/uL (ref 0.1–1.0)
Monocytes Relative: 4 %
Neutro Abs: 24.3 10*3/uL — ABNORMAL HIGH (ref 1.7–7.7)
Neutrophils Relative %: 94 %
Platelets: 249 10*3/uL (ref 150–400)
RBC: 4.9 MIL/uL (ref 4.22–5.81)
RDW: 13.2 % (ref 11.5–15.5)
WBC: 25.9 10*3/uL — ABNORMAL HIGH (ref 4.0–10.5)
nRBC: 0 % (ref 0.0–0.2)
nRBC: 0 /100 WBC

## 2019-12-13 LAB — COMPREHENSIVE METABOLIC PANEL
ALT: 15 U/L (ref 0–44)
AST: 21 U/L (ref 15–41)
Albumin: 4.3 g/dL (ref 3.5–5.0)
Alkaline Phosphatase: 44 U/L (ref 38–126)
Anion gap: 12 (ref 5–15)
BUN: 16 mg/dL (ref 6–20)
CO2: 23 mmol/L (ref 22–32)
Calcium: 9.5 mg/dL (ref 8.9–10.3)
Chloride: 105 mmol/L (ref 98–111)
Creatinine, Ser: 1.08 mg/dL (ref 0.61–1.24)
GFR calc Af Amer: >60 mL/min (ref >60)
GFR calc non Af Amer: >60 mL/min (ref >60)
Glucose, Bld: 145 mg/dL — ABNORMAL HIGH (ref 70–99)
Potassium: 4.1 mmol/L (ref 3.5–5.1)
Sodium: 140 mmol/L (ref 135–145)
Total Bilirubin: 1.1 mg/dL (ref 0.3–1.2)
Total Protein: 7.1 g/dL (ref 6.5–8.1)

## 2019-12-13 LAB — IGG CSF INDEX
Albumin CSF-mCnc: 22 mg/dL (ref 11–48)
Albumin: 4.6 g/dL (ref 4.1–5.2)
CSF IgG Index: 1.1 — ABNORMAL HIGH (ref 0.0–0.7)
IgG (Immunoglobin G), Serum: 1343 mg/dL (ref 603–1613)
IgG, CSF: 7.2 mg/dL (ref 0.0–8.6)
IgG/Alb Ratio, CSF: 0.33 — ABNORMAL HIGH (ref 0.00–0.25)

## 2019-12-13 LAB — EXTRACTABLE NUCLEAR ANTIGEN ANTIBODY
ENA SM Ab Ser-aCnc: <0.2 AI (ref 0.0–0.9)
Ribonucleic Protein: <0.2 AI (ref 0.0–0.9)
SSA (Ro) (ENA) Antibody, IgG: <0.2 AI (ref 0.0–0.9)
SSB (La) (ENA) Antibody, IgG: <0.2 AI (ref 0.0–0.9)
Scleroderma (Scl-70) (ENA) Antibody, IgG: <0.2 AI (ref 0.0–0.9)
ds DNA Ab: 9 IU/mL (ref 0–9)

## 2019-12-13 LAB — ANTI-JO 1 ANTIBODY, IGG: Anti JO-1: <0.2 AI (ref 0.0–0.9)

## 2019-12-13 LAB — CYTOLOGY - NON PAP

## 2019-12-13 LAB — MAGNESIUM: Magnesium: 1.8 mg/dL (ref 1.7–2.4)

## 2019-12-13 LAB — ANGIOTENSIN CONVERTING ENZYME: Angiotensin-Converting Enzyme: 17 U/L (ref 14–82)

## 2019-12-13 LAB — PHOSPHORUS: Phosphorus: 3.7 mg/dL (ref 2.5–4.6)

## 2019-12-13 NOTE — Evaluation (Signed)
Occupational Therapy Evaluation Patient Details Name: Jaime [name] MRN: [id number] DOB: October 25, 1995 Today's Date: 12/13/2019    History of Present Illness 24 yo male presenting with increasing weakness and difficulty with ambulation over last three months. Several falls recently. MRI of the C-spine, T-spine, and L-spine ordered and showed diffuse demyelinating lesions concerning for MS. PMH including autism.    Clinical Impression   PTA, pt was living with his mother and three younger sisters and was independent with ADLs. Pt enjoys art and playing guitar. Pt currently requiring Min Guard A for UB ADLs, Mod A for LB ADLs, and Mod A +2 for functional mobility. Pt presenting with decreased balance, strength at BLEs, and coordination impacting his safe performance of ADLs and functional mobility. HR elevated 130s during activity. Pt would benefit from further acute OT to facilitate safe dc. Recommend dc to CIR for further OT to optimize safety, independence with ADLs, and return to PLOF.      Follow Up Recommendations  CIR;Supervision/Assistance - 24 hour    Equipment Recommendations  Other (comment)(Defer to next venue)    Recommendations for Other Services Rehab consult;PT consult     Precautions / Restrictions Precautions Precautions: Fall Restrictions Weight Bearing Restrictions: No      Mobility Bed Mobility Overal bed mobility: Needs Assistance Bed Mobility: Supine to Sit     Supine to sit: Supervision;HOB elevated     General bed mobility comments: supervision for safety  Transfers Overall transfer level: Needs assistance Equipment used: 1 person hand held assist Transfers: Sit to/from Stand Sit to Stand: Min assist;+2 safety/equipment         General transfer comment: Min A to power up into standing. +2 for safety and line management.     Balance Overall balance assessment: Needs assistance Sitting-balance support: No upper extremity supported;Feet  supported Sitting balance-Leahy Scale: Fair     Standing balance support: Single extremity supported;During functional activity Standing balance-Leahy Scale: Poor                             ADL either performed or assessed with clinical judgement   ADL Overall ADL's : Needs assistance/impaired Eating/Feeding: Set up;Sitting   Grooming: Set up;Supervision/safety;Sitting   Upper Body Bathing: Min guard;Sitting   Lower Body Bathing: Moderate assistance;Sit to/from stand   Upper Body Dressing : Min guard;Sitting Upper Body Dressing Details (indicate cue type and reason): Donning second gown like a jacket Lower Body Dressing: Moderate assistance;Sit to/from stand Lower Body Dressing Details (indicate cue type and reason): Noting decreased balance when bending forward to adjust sock Toilet Transfer: Moderate assistance;+2 for safety/equipment;Ambulation(simulated to recliner)           Functional mobility during ADLs: Moderate assistance;+2 for safety/equipment General ADL Comments: Pt presenting with significant weakness and poor coorindation at BLEs impacting his safe performance of ADLs and mobility     Vision Baseline Vision/History: Wears glasses Wears Glasses: At all times Patient Visual Report: No change from baseline(Denies any blurry vision)       Perception     Praxis      Pertinent Vitals/Pain Pain Assessment: Faces Faces Pain Scale: Hurts a little bit Pain Location: IV site Pain Descriptors / Indicators: Constant Pain Intervention(s): Monitored during session;Repositioned     Hand Dominance Right("My plan is to be ambidextrous")   Extremity/Trunk Assessment Upper Extremity Assessment Upper Extremity Assessment: Generalized weakness   Lower Extremity Assessment Lower Extremity Assessment: Defer to PT  evaluation   Cervical / Trunk Assessment Cervical / Trunk Assessment: Normal   Communication Communication Communication: No  difficulties(Benefits from increased time for processing)   Cognition Arousal/Alertness: Awake/alert Behavior During Therapy: WFL for tasks assessed/performed Overall Cognitive Status: Within Functional Limits for tasks assessed                                 General Comments: Baseline autism diagnosis. Very pleasant and agreeable to therapy. Follow all cues and requiring increased time for processing.    General Comments  HR elevating to 130s with activity.    Exercises     Shoulder Instructions      Home Living Family/patient expects to be discharged to:: Private residence Living Arrangements: Parent(Mother) Available Help at Discharge: Family Type of Home: Apartment(Possibly second floor apartment) Home Access: Stairs to enter Technical brewer of Steps: flight Entrance Stairs-Rails: Right(?) Home Layout: One level     Bathroom Shower/Tub: Teacher, early years/pre: Standard     Home Equipment: None   Additional Comments: Will need to confirm information with mother as pt with slight difficulty describing details of home      Prior Functioning/Environment Level of Independence: Independent        Comments: Enjoys playing guitar, splatter paint, and was volunteering at [employer] [facility].        OT Problem List: Decreased strength;Decreased range of motion;Decreased activity tolerance;Impaired balance (sitting and/or standing);Decreased knowledge of use of DME or AE;Decreased knowledge of precautions      OT Treatment/Interventions: Self-care/ADL training;Therapeutic exercise;Energy conservation;DME and/or AE instruction;Therapeutic activities;Patient/family education    OT Goals(Current goals can be found in the care plan section) Acute Rehab OT Goals Patient Stated Goal: Be able to play guitar again OT Goal Formulation: With patient Time For Goal Achievement: 12/27/19 Potential to Achieve Goals: Good  OT Frequency: Min 2X/week   Barriers to  D/C:            Co-evaluation PT/OT/SLP Co-Evaluation/Treatment: Yes Reason for Co-Treatment: For patient/therapist safety;To address functional/ADL transfers   OT goals addressed during session: ADL's and self-care      AM-PAC OT "6 Clicks" Daily Activity     Outcome Measure Help from another person eating meals?: None Help from another person taking care of personal grooming?: A Little Help from another person toileting, which includes using toliet, bedpan, or urinal?: A Lot Help from another person bathing (including washing, rinsing, drying)?: A Lot Help from another person to put on and taking off regular upper body clothing?: A Little Help from another person to put on and taking off regular lower body clothing?: A Lot 6 Click Score: 16   End of Session Equipment Utilized During Treatment: Gait belt Nurse Communication: Mobility status  Activity Tolerance: Patient tolerated treatment well Patient left: in chair;with call bell/phone within reach;with chair alarm set  OT Visit Diagnosis: Unsteadiness on feet (R26.81);Other abnormalities of gait and mobility (R26.89);Muscle weakness (generalized) (M62.81)                Time: 6222-9798 OT Time Calculation (min): 17 min Charges:  OT General Charges $OT Visit: 1 Visit OT Evaluation $OT Eval Moderate Complexity: [place], OTR/L Acute Rehab Pager: [phone] Office: [place] 12/13/2019, 11:41 AM

## 2019-12-13 NOTE — Progress Notes (Signed)
NEUROLOGY PROGRESS NOTE  Subjective: Patient currently has no complaints.  Exam: Vitals:   12/12/19 2005 12/13/19 0748  BP: 107/64 (!) 108/59  Pulse: 97 88  Resp:  (!) 23  Temp: 98.4 F (36.9 C) 98.3 F (36.8 C)  SpO2:  97%    ROS General ROS: negative for - chills, fatigue, fever, night sweats, weight gain or weight loss Psychological ROS: negative for - behavioral disorder, hallucinations, memory difficulties, mood swings or suicidal ideation Ophthalmic ROS: negative for - blurry vision, double vision, eye pain or loss of vision ENT ROS: negative for - epistaxis, nasal discharge, oral lesions, sore throat, tinnitus or vertigo Respiratory ROS: negative for - cough, hemoptysis, shortness of breath or wheezing Cardiovascular ROS: negative for - chest pain, dyspnea on exertion, edema or irregular heartbeat Gastrointestinal ROS: negative for - abdominal pain, diarrhea, hematemesis, nausea/vomiting or stool incontinence Genito-Urinary ROS: negative for - dysuria, hematuria, incontinence or urinary frequency/urgency Musculoskeletal ROS: negative for - joint swelling or muscular weakness Neurological ROS: as noted in HPI Dermatological ROS: negative for rash and skin lesion changes   Physical Exam  Constitutional: Appears well-developed and well-nourished.  Psych: Affect appropriate to situation Eyes: No scleral injection HENT: No OP obstrucion Head: Normocephalic.  Cardiovascular: Normal rate and regular rhythm.  Respiratory: Effort normal, non-labored breathing GI: Soft.  No distension. There is no tenderness.  Skin: WDI  Neuro:  Mental Status: Alert, oriented, thought content appropriate.  Speech fluent without evidence of aphasia.  Able to follow 3 step commands without difficulty. Cranial Nerves: II:  Visual fields grossly normal,  III,IV, VI: ptosis not present, extra-ocular motions intact bilaterally pupils equal, round, reactive to light and accommodation V,VII: smile  symmetric, facial light touch sensation normal bilaterally VIII: hearing normal bilaterally IX,X: Palate rises midline XI: bilateral shoulder shrug XII: midline tongue extension Motor: Right : Upper extremity   5/5    Left:     Upper extremity   4/5  Lower extremity   5/5     Lower extremity   5/5 Tone and bulk:normal tone throughout; no atrophy noted Sensory: Pinprick and light touch intact throughout, bilaterally Plantars: Right: downgoing   Left: downgoing Cerebellar: Patient remains ataxic with heel-to-shin bilaterally, along with mild bilateral dysmetria with finger-nose-finger     Medications:  Scheduled: . pantoprazole  40 mg Oral Daily   Continuous: . methylPREDNISolone (SOLU-MEDROL) injection 1,000 mg (12/13/19 1001)    Pertinent Labs/Diagnostics: Results for KUNAL, LEVARIO (MRN [id number]) as of 12/13/2019 10:33  Ref. Range 12/12/2019 00:02 12/12/2019 00:02  Appearance, CSF Latest Ref Range: CLEAR  CLEAR CLEAR  Glucose, CSF Latest Ref Range: 40 - 70 mg/dL 57   RBC Count, CSF Latest Ref Range: 0 /cu mm 58 (H) 4 (H)  WBC, CSF Latest Ref Range: 0 - 5 /cu mm 3 2  Other Cells, CSF Unknown TOO FEW TO COUNT, SMEAR AVAILABLE FOR REVIEW TOO FEW TO COUNT, SMEAR AVAILABLE FOR REVIEW  Color, CSF Latest Ref Range: COLORLESS  COLORLESS COLORLESS  Supernatant Unknown COLORLESS COLORLESS  Total  Protein, CSF Latest Ref Range: 15 - 45 mg/dL 33   Tube # Unknown 1 4   Angiotensin-converting enzyme 17 Anti-Jo 1 antibody, IgG pending Extractable nuclear antigen antibody pending CSF cytology pending RPR nonreactive NML antibody pending Serum IgG pending Serum copper pending CSF IgG pending B12 678 Thiamine pending   Etta Quill PA-C [facility] [phone]  Assessment:  24 year old highly functioning autistic male with a history of depression presents with  3 to 4 months of multiple neurological symptoms which include difficulty walking, incoordination and slurred  speech.  1. Neuroimaging of the whole neuraxis is most consistent with active demyelinating disease. The most likely etiology based on the imaging appearance is MS. NMO is unlikely due to the profusion of intracranial demyelinating lesions. Other differentials would include neurosarcoid and CNS lupus and CNS lymphomatous process.   2. Initial CSF labs with normal protein, glucose and WBC 3. Patient thus far has received 2 doses out of 5 of IV Solu-Medrol 1000 mg  Recommendations: -Awaiting additional CSF labs -Continue with a total of 5 days of Solu-Medrol 1 g/day IV -Will need follow-up with an MS specialist outpatient-Dr. Despina Arias at [facility] Neurology -We will need PT/OT/speech therapy eval while in hospital  Electronically signed: Dr. Caryl Pina 12/13/2019, 10:28 AM

## 2019-12-13 NOTE — Progress Notes (Signed)
PROGRESS NOTE    Jaime [name]  XFG:[id number] DOB: 1995/12/10 DOA: 12/11/2019 PCP: Patient, No Pcp Per   Brief Narrative:  The patient is a 24 year old with a past medical history significant for autism currently not on any medication who started experience weakness and difficulty ambulating over the last 2 months.  This is progressively worsened and he had 3 falls while walking his dog and he did hit his head in the process did not lose consciousness.  Mother had stated that he had been stumbling while walking and started to walk like a toddler and over the last few weeks patient had blurred vision.  He was brought to the emergency room for further evaluation and per the patient's mother he had a prolonged respiratory tract infection 2 months ago in January during which she did have a persistent productive cough.  He had an MRI of the C-spine, T-spine, and L-spine ordered and showed diffuse demyelinating lesions.  Neurology was consulted and recommended the patient be started on Solu-Medrol 1 g daily for 5 days.  Lumbar puncture was done as well and patient was transferred to [facility] for further evaluation.  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  -Other differentials includes neuromyelitis optica neurosarcoid and CNS lymphomatous's versus other CNS lymphomatous process.  -Patient has been started on IV Solu-Medrol 1 g daily for 5 days -Follow-up CSF labs and showed a IgG serum of 1343, IgG CSF of 7.2, CSF IgG index of 1.1, and Ig G/albumin ratio of CSF of 0.33, nonreactive RPR, negative and nonreactive HIV antibodies, RBC count of 58 in the CSF, WBC of 3, with CSF culture  pending and Gram stain showing few WBCs predominantly mononuclear -Cytology of the CSF showed no malignant cells -Checked ACElevels and was 5 -Neurology consulted for further evaluation and recommendations and appreciate further assistance in management -Obtain PT and OT to evaluate and treat they are recommending CIR and we have consulted the rehab admissions coordinator as well as the rehab MD -Neurology recommends possibly extending Solu-Medrol for 2 more days after the 5-day dosing based on clinical response -They also recommend considering plasma exchange if clinically appropriate -He will need to follow-up with Dr. Despina Arias at discharge -Per neurology he may be a candidate for Tysabri or Ocrevus -Checking a urinalysis and chest x-ray to make sure there is no evidence of infection as he is currently now on steroids  and urinalysis was unremarkable and chest x-ray showed no active disease -We will add Protonix PPI for GI prophylaxis -Appreciate further neurological assistance and evaluation recommendations  History of Autism  -Presently not on any medication.  Hypokalemia  -His potassium level this morning was 4.1 -Continue to monitor and replete as necessary  -Repeat CMP in a.m.  Leukocytosis, worsening -Likely in the setting of steroid demargination as WBC went from 10-> 12.7 -> 25.9 -Continue monitor and trend and Monitor for S/Sx of Infection  -Repeat CBC in a.m.   Hyperglycemia -In the setting of steroid demargination -Patient's blood sugar on admission was normal at 89 on his BMP and repeat CMP this morning showed a blood glucose of 145 -Continue to monitor and trend blood sugars carefully and if necessary  will place on symptoms NovoLog sliding scale insulin AC -CBG last night was 146 -May consider obtaining a hemoglobin A1c to rule out diabetes component  DVT prophylaxis:  Code Status: FULL CODE  Family Communication: Attempted to call Mother but phone received  a busy signal  Disposition Plan: Patient is from home and now requiring an increased level of care and PT OT recommending CIR.  Will need to continue with IV Solu-Medrol and complete the regimen prior to discharge along with having Neurology clearance   Consultants:   Neurology   Procedures: None  Antimicrobials:  Anti-infectives (From admission, onward)   None     Subjective: Seen and examined at bedside and he states that he was doing okay and denied any pain but states that he did have some insomnia last night.  No shortness of breath.  PT still had not come by when I saw him this morning.  I tried to call his mother to give her an update but she did not answer.  Patient no other concerns or complaints at this time.  Objective: Vitals:   12/12/19 1542 12/12/19 2005 12/13/19 0748 12/13/19 1105  BP: 129/75 107/64 (!) 108/59 102/75  Pulse: (!) 103 97 88 90  Resp: 15  (!) 23 15  Temp: 99.3 F (37.4 C) 98.4 F (36.9 C) 98.3 F (36.8 C) 98.6 F (37 C)  TempSrc: Oral Oral Oral Oral  SpO2: 98%  97% 98%  Weight:      Height:       No intake or output data in the 24 hours ending 12/13/19 1345 Filed Weights   12/11/19 1607  Weight: 54.4 kg   Examination: Physical Exam:  Constitutional: Thin male currently in NAD and appears calm and comfortable Eyes: Lids and conjunctivae normal, sclerae anicteric  ENMT: External Ears, Nose appear normal. Grossly normal hearing. Neck: Appears normal, supple, no cervical masses, normal ROM, no appreciable thyromegaly;; no JVD Respiratory: Diminished to auscultation bilaterally, no wheezing, rales, rhonchi or crackles. Normal respiratory effort and patient is not tachypenic. No accessory muscle use.  Unlabored breathing Cardiovascular: RRR, no murmurs / rubs / gallops. S1 and S2 auscultated. No extremity edema.  Abdomen: Soft, non-tender, non-distended. Bowel sounds positive.  GU: Deferred. Musculoskeletal: No clubbing / cyanosis of  digits/nails. No joint deformity upper and lower extremities.  Skin: No rashes, lesions, ulcers on limited skin evaluation. No induration; Warm and dry.  Neurologic: CN 2-12 grossly intact with no focal deficits but patient does have some upper arm weakness. Romberg sign and cerebellar reflexes not assessed.  Psychiatric: Alert and oriented x 3. Pleasant and normal mood and appropriate affect.   Data Reviewed: I have personally reviewed following labs and imaging studies  CBC: Recent Labs  Lab 12/11/19 2110 12/12/19 0605 12/13/19 0944  WBC 10.1 12.7* 25.9*  NEUTROABS 7.5 11.7* 24.3*  HGB 14.9 15.5 15.4  HCT 43.3 44.3 44.8  MCV 91.4 90.2 91.4  PLT 207 219 249   Basic Metabolic Panel: Recent Labs  Lab 12/11/19 2110 12/12/19 0605 12/13/19 0944  NA 141 138 140  K 3.4* 3.4* 4.1  CL 109 108 105  CO2 GLUCOSE 89 150* 145*  BUN CREATININE 0.80 0.90 1.08  CALCIUM 9.2 9.3 9.5  MG  --   --  1.8  PHOS  --   --  3.7   GFR: Estimated Creatinine Clearance: 81.9 mL/min (by C-G formula based on SCr of 1.08 mg/dL). Liver Function  Tests: Recent Labs  Lab 12/12/19 0002 12/12/19 0605 12/13/19 0944  AST  --  17 21  ALT  --  16 15  ALKPHOS  --  41 44  BILITOT  --  1.0 1.1  PROT  --  7.4 7.1  ALBUMIN 4.6 4.4 4.3   No results for input(s): LIPASE, AMYLASE in the last 168 hours. No results for input(s): AMMONIA in the last 168 hours. Coagulation Profile: No results for input(s): INR, PROTIME in the last 168 hours. Cardiac Enzymes: No results for input(s): CKTOTAL, CKMB, CKMBINDEX, TROPONINI in the last 168 hours. BNP (last 3 results) No results for input(s): PROBNP in the last 8760 hours. HbA1C: No results for input(s): HGBA1C in the last 72 hours. CBG: Recent Labs  Lab 12/12/19 2131  GLUCAP 146*   Lipid Profile: No results for input(s): CHOL, HDL, LDLCALC, TRIG, CHOLHDL, LDLDIRECT in the last 72 hours. Thyroid Function Tests: No results for input(s):  TSH, T4TOTAL, FREET4, T3FREE, THYROIDAB in the last 72 hours. Anemia Panel: Recent Labs    12/11/19 2110  VITAMINB12 678   Sepsis Labs: No results for input(s): PROCALCITON, LATICACIDVEN in the last 168 hours.  Recent Results (from the past 240 hour(s))  SARS CORONAVIRUS 2 (TAT 6-24 HRS) Nasopharyngeal Nasopharyngeal Swab     Status: None   Collection Time: 12/12/19 12:02 AM   Specimen: Nasopharyngeal Swab  Result Value Ref Range Status   SARS Coronavirus 2 NEGATIVE NEGATIVE Final    Comment: (NOTE) SARS-CoV-2 target nucleic acids are NOT DETECTED. The SARS-CoV-2 RNA is generally detectable in upper and lower respiratory specimens during the acute phase of infection. Negative results do not preclude SARS-CoV-2 infection, do not rule out co-infections with other pathogens, and should not be used as the sole basis for treatment or other patient management decisions. Negative results must be combined with clinical observations, patient history, and epidemiological information. The expected result is Negative. Fact Sheet for Patients: HairSlick.no Fact Sheet for Healthcare Providers: quierodirigir.com This test is not yet approved or cleared by the [place] FDA and  has been authorized for detection and/or diagnosis of SARS-CoV-2 by FDA under an Emergency Use Authorization (EUA). This EUA will remain  in effect (meaning this test can be used) for the duration of the COVID-19 declaration under Section 56 4(b)(1) of the Act, 21 U.S.C. section 360bbb-3(b)(1), unless the authorization is terminated or revoked sooner. Performed at [facility], [address], [place], [place] [address]   CSF culture     Status: None (Preliminary result)   Collection Time: 12/12/19 12:02 AM   Specimen: CSF; Cerebrospinal Fluid  Result Value Ref Range Status   Specimen Description CSF  Final   Special Requests   Final     NONE Performed at [facility], [address], [place], [place] [address]    Gram Stain   Final    FEW WBC PRESENT, PREDOMINANTLY MONONUCLEAR NO ORGANISMS SEEN RESULT CALLED TO, READ BACK BY AND VERIFIED WITH: RN KATIE AT 0132 12/12/19 CRUICKSHANK A Performed at [facility], [address], [place], [place] [address]    Culture   Final    NO GROWTH 1 DAY Performed at [facility], [address], [place], [place] [address]    Report Status PENDING  Incomplete  MRSA PCR Screening     Status: None   Collection Time: 12/12/19  9:52 AM   Specimen: Nasal Mucosa; Nasopharyngeal  Result Value Ref Range  Status   MRSA by PCR NEGATIVE NEGATIVE Final    Comment:        The GeneXpert MRSA Assay (FDA approved for NASAL specimens only), is one component of a comprehensive MRSA colonization surveillance program. It is not intended to diagnose MRSA infection nor to guide or monitor treatment for MRSA infections. Performed at [facility], [address], [place], [place] [address]     RN Pressure Injury Documentation:     Estimated body mass index is 15.83 kg/m as calculated from the following:   Height as of this encounter: 6\' 1"  (1.854 m).   Weight as of this encounter: 54.4 kg.  Malnutrition Type:      Malnutrition Characteristics:      Nutrition Interventions:   Radiology Studies: MR Brain W and Wo Contrast  Result Date: 12/11/2019 CLINICAL DATA:  Initial evaluation for progressive ataxia, concern for stroke, multiple sclerosis. EXAM: MRI HEAD WITHOUT AND WITH CONTRAST MRI CERVICAL, THORACIC, AND LUMBAR SPINE WITHOUT AND WITH CONTRAST TECHNIQUE: Multisequence MR imaging of the spine from the cervical spine to the sacrum was performed prior to and following IV contrast administration for evaluation of spinal metastatic disease. CONTRAST:  73mL GADAVIST GADOBUTROL 1 MMOL/ML IV SOLN COMPARISON:  None available. FINDINGS: MRI  HEAD SPINE FINDINGS Brain: Cerebral volume within normal limits for age. Innumerable scattered foci of T2/FLAIR hyperintensity seen involving the periventricular, deep, and juxta cortical white matter of both cerebral hemispheres. Many of these foci are oriented perpendicular to the lateral ventricles. Extensive involvement of the deep gray nuclei, brainstem, and cerebellar peduncles. Associated multiple scattered T1 black holes. For reference purposes, largest focus seen at the posterior left centrum semi ovale and measures approximately 2 cm. 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 Extensive enhancement seen about multiple lesions following contrast administration, compatible with active demyelination. No evidence for acute or subacute infarct. No encephalomalacia to suggest chronic infarction. No evidence for acute or chronic intracranial hemorrhage. No mass lesion or mass effect. No hydrocephalus. No extra-axial fluid collection. Pituitary gland suprasellar region normal. Midline structures intact. Vascular: Major intracranial vascular flow voids are maintained. Skull and upper cervical spine: Craniocervical junction normal. Bone marrow signal intensity within normal limits. No scalp soft tissue abnormality. Sinuses/Orbits: Globes and orbital soft tissues within normal limits. Scattered mucosal thickening noted throughout the paranasal sinuses, with small volume layering fluid within the lateral recess of the left sphenoid sinus. No mastoid effusion. Inner ear structures grossly normal. Other: None. MRI CERVICAL SPINE FINDINGS Alignment: Straightening of the normal cervical lordosis. No listhesis. Vertebrae: Vertebral body height maintained without evidence for acute or chronic fracture. Bone marrow signal intensity normal. No discrete or worrisome osseous lesions. No abnormal marrow edema or enhancement. Cord: Extensive signal abnormality seen involving the majority of the cervical  spine, extending from the cervicomedullary junction through the upper thoracic spine, compatible with demyelinating disease/multiple sclerosis. For reference purposes, most prominent of these lesions involves the right dorsal cord at the level of C2 (series 7, image 3). No definite associated enhancement to suggest active demyelination, although evaluation somewhat limited by motion artifact. Underlying cord is somewhat atrophic in appearance. Posterior Fossa, vertebral arteries, paraspinal tissues: Unremarkable. Disc levels: No significant disc pathology seen within the cervical spine. No significant facet degeneration. No canal or neural foraminal stenosis. No impingement. MRI THORACIC SPINE FINDINGS Alignment: Physiologic with preservation of the normal thoracic kyphosis. No listhesis. Vertebrae: Vertebral body height maintained without evidence for acute or chronic fracture. Bone marrow signal  intensity within normal limits. No discrete or worrisome osseous lesions. No abnormal marrow edema or enhancement. Cord: Extensive signal abnormality seen throughout the thoracic spinal cord, essentially involving nearly all levels. Finding consistent with demyelinating disease. Patchy postcontrast enhancement involving the cord at the level of T1-2 consistent with active demyelination (series 32, image 8). Suspected additional faint patchy post-contrast enhancement at the level of T10-11 (series 32, image 8). Underlying cord is mildly atrophic in appearance. Paraspinal and other soft tissues: Unremarkable. Disc levels: T6-7: Small central disc protrusion indents the ventral thecal sac, contacting and minimally flattening the ventral cord. No significant stenosis. No other significant disc pathology seen within the thoracic spine. No other stenosis or neural impingement. MRI LUMBAR SPINE FINDINGS Segmentation:  Standard. Alignment:  Physiologic. Vertebrae: Vertebral body height maintained without evidence for acute or  chronic fracture. Bone marrow signal intensity normal. No discrete or worrisome osseous lesions. No abnormal marrow edema or enhancement. Conus medullaris: Extends to the L1-2 level. Patchy signal abnormality seen within the distal cord, consistent with demyelinating disease. Finding better evaluated on concomitant thoracic spine MRI. No appreciable abnormal enhancement. Nerve roots of the cauda equina within normal limits. Paraspinal and other soft tissues: Unremarkable. Disc levels: L1-2:  Unremarkable. L2-3:  Unremarkable. L3-4:  Unremarkable. L4-5:  Unremarkable. L5-S1: Tiny central disc protrusion with slight inferior migration. No significant spinal stenosis or neural impingement. Foramina remain patent. IMPRESSION: MRI HEAD IMPRESSION: Extensive T2/FLAIR signal abnormality scattered throughout the supratentorial and infratentorial brain, consistent with demyelinating disease/multiple sclerosis. Associated enhancement about numerous lesions, consistent with active demyelination. MRI CERVICAL SPINE IMPRESSION: Extensive signal abnormality involving essentially the majority of the cervical spinal cord, consistent with demyelinating disease. No abnormal enhancement to suggest active demyelination. MRI THORACIC SPINE IMPRESSION: 1. Extensive signal abnormality involving essentially the entirety of the thoracic spinal cord, consistent with demyelinating disease. Patchy postcontrast enhancement at the level of T1-2 and T10-11 consistent with active demyelination. 2. Small central disc protrusion at T6-7 without significant stenosis. MRI LUMBAR SPINE IMPRESSION: 1. Patchy signal abnormality within the visualized distal cord, consistent with demyelinating disease. No abnormal enhancement to suggest active demyelination. 2. Tiny central disc protrusion at L5-S1 without stenosis or impingement. Electronically Signed   By: Rise Mu M.D.   On: 12/11/2019 22:35   MR CERVICAL SPINE W WO CONTRAST  Result  Date: 12/11/2019 CLINICAL DATA:  Initial evaluation for progressive ataxia, concern for stroke, multiple sclerosis. EXAM: MRI HEAD WITHOUT AND WITH CONTRAST MRI CERVICAL, THORACIC, AND LUMBAR SPINE WITHOUT AND WITH CONTRAST TECHNIQUE: Multisequence MR imaging of the spine from the cervical spine to the sacrum was performed prior to and following IV contrast administration for evaluation of spinal metastatic disease. CONTRAST:  5mL GADAVIST GADOBUTROL 1 MMOL/ML IV SOLN COMPARISON:  None available. FINDINGS: MRI HEAD SPINE FINDINGS Brain: Cerebral volume within normal limits for age. Innumerable scattered foci of T2/FLAIR hyperintensity seen involving the periventricular, deep, and juxta cortical white matter of both cerebral hemispheres. Many of these foci are oriented perpendicular to the lateral ventricles. Extensive involvement of the deep gray nuclei, brainstem, and cerebellar peduncles. Associated multiple scattered T1 black holes. For reference purposes, largest focus seen at the posterior left centrum semi ovale and measures approximately 2 cm. 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 Extensive enhancement seen about multiple lesions following contrast administration, compatible with active demyelination. No evidence for acute or subacute infarct. No encephalomalacia to suggest chronic infarction. No evidence for acute or chronic intracranial hemorrhage. No mass lesion or mass effect.  No hydrocephalus. No extra-axial fluid collection. Pituitary gland suprasellar region normal. Midline structures intact. Vascular: Major intracranial vascular flow voids are maintained. Skull and upper cervical spine: Craniocervical junction normal. Bone marrow signal intensity within normal limits. No scalp soft tissue abnormality. Sinuses/Orbits: Globes and orbital soft tissues within normal limits. Scattered mucosal thickening noted throughout the paranasal sinuses, with small volume layering fluid  within the lateral recess of the left sphenoid sinus. No mastoid effusion. Inner ear structures grossly normal. Other: None. MRI CERVICAL SPINE FINDINGS Alignment: Straightening of the normal cervical lordosis. No listhesis. Vertebrae: Vertebral body height maintained without evidence for acute or chronic fracture. Bone marrow signal intensity normal. No discrete or worrisome osseous lesions. No abnormal marrow edema or enhancement. Cord: Extensive signal abnormality seen involving the majority of the cervical spine, extending from the cervicomedullary junction through the upper thoracic spine, compatible with demyelinating disease/multiple sclerosis. For reference purposes, most prominent of these lesions involves the right dorsal cord at the level of C2 (series 7, image 3). No definite associated enhancement to suggest active demyelination, although evaluation somewhat limited by motion artifact. Underlying cord is somewhat atrophic in appearance. Posterior Fossa, vertebral arteries, paraspinal tissues: Unremarkable. Disc levels: No significant disc pathology seen within the cervical spine. No significant facet degeneration. No canal or neural foraminal stenosis. No impingement. MRI THORACIC SPINE FINDINGS Alignment: Physiologic with preservation of the normal thoracic kyphosis. No listhesis. Vertebrae: Vertebral body height maintained without evidence for acute or chronic fracture. Bone marrow signal intensity within normal limits. No discrete or worrisome osseous lesions. No abnormal marrow edema or enhancement. Cord: Extensive signal abnormality seen throughout the thoracic spinal cord, essentially involving nearly all levels. Finding consistent with demyelinating disease. Patchy postcontrast enhancement involving the cord at the level of T1-2 consistent with active demyelination (series 32, image 8). Suspected additional faint patchy post-contrast enhancement at the level of T10-11 (series 32, image 8).  Underlying cord is mildly atrophic in appearance. Paraspinal and other soft tissues: Unremarkable. Disc levels: T6-7: Small central disc protrusion indents the ventral thecal sac, contacting and minimally flattening the ventral cord. No significant stenosis. No other significant disc pathology seen within the thoracic spine. No other stenosis or neural impingement. MRI LUMBAR SPINE FINDINGS Segmentation:  Standard. Alignment:  Physiologic. Vertebrae: Vertebral body height maintained without evidence for acute or chronic fracture. Bone marrow signal intensity normal. No discrete or worrisome osseous lesions. No abnormal marrow edema or enhancement. Conus medullaris: Extends to the L1-2 level. Patchy signal abnormality seen within the distal cord, consistent with demyelinating disease. Finding better evaluated on concomitant thoracic spine MRI. No appreciable abnormal enhancement. Nerve roots of the cauda equina within normal limits. Paraspinal and other soft tissues: Unremarkable. Disc levels: L1-2:  Unremarkable. L2-3:  Unremarkable. L3-4:  Unremarkable. L4-5:  Unremarkable. L5-S1: Tiny central disc protrusion with slight inferior migration. No significant spinal stenosis or neural impingement. Foramina remain patent. IMPRESSION: MRI HEAD IMPRESSION: Extensive T2/FLAIR signal abnormality scattered throughout the supratentorial and infratentorial brain, consistent with demyelinating disease/multiple sclerosis. Associated enhancement about numerous lesions, consistent with active demyelination. MRI CERVICAL SPINE IMPRESSION: Extensive signal abnormality involving essentially the majority of the cervical spinal cord, consistent with demyelinating disease. No abnormal enhancement to suggest active demyelination. MRI THORACIC SPINE IMPRESSION: 1. Extensive signal abnormality involving essentially the entirety of the thoracic spinal cord, consistent with demyelinating disease. Patchy postcontrast enhancement at the level  of T1-2 and T10-11 consistent with active demyelination. 2. Small central disc protrusion at T6-7 without significant stenosis. MRI  LUMBAR SPINE IMPRESSION: 1. Patchy signal abnormality within the visualized distal cord, consistent with demyelinating disease. No abnormal enhancement to suggest active demyelination. 2. Tiny central disc protrusion at L5-S1 without stenosis or impingement. Electronically Signed   By: Rise Mu M.D.   On: 12/11/2019 22:35   MR THORACIC SPINE W WO CONTRAST  Result Date: 12/11/2019 CLINICAL DATA:  Initial evaluation for progressive ataxia, concern for stroke, multiple sclerosis. EXAM: MRI HEAD WITHOUT AND WITH CONTRAST MRI CERVICAL, THORACIC, AND LUMBAR SPINE WITHOUT AND WITH CONTRAST TECHNIQUE: Multisequence MR imaging of the spine from the cervical spine to the sacrum was performed prior to and following IV contrast administration for evaluation of spinal metastatic disease. CONTRAST:  5mL GADAVIST GADOBUTROL 1 MMOL/ML IV SOLN COMPARISON:  None available. FINDINGS: MRI HEAD SPINE FINDINGS Brain: Cerebral volume within normal limits for age. Innumerable scattered foci of T2/FLAIR hyperintensity seen involving the periventricular, deep, and juxta cortical white matter of both cerebral hemispheres. Many of these foci are oriented perpendicular to the lateral ventricles. Extensive involvement of the deep gray nuclei, brainstem, and cerebellar peduncles. Associated multiple scattered T1 black holes. For reference purposes, largest focus seen at the posterior left centrum semi ovale and measures approximately 2 cm. 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 Extensive enhancement seen about multiple lesions following contrast administration, compatible with active demyelination. No evidence for acute or subacute infarct. No encephalomalacia to suggest chronic infarction. No evidence for acute or chronic intracranial hemorrhage. No mass lesion or mass  effect. No hydrocephalus. No extra-axial fluid collection. Pituitary gland suprasellar region normal. Midline structures intact. Vascular: Major intracranial vascular flow voids are maintained. Skull and upper cervical spine: Craniocervical junction normal. Bone marrow signal intensity within normal limits. No scalp soft tissue abnormality. Sinuses/Orbits: Globes and orbital soft tissues within normal limits. Scattered mucosal thickening noted throughout the paranasal sinuses, with small volume layering fluid within the lateral recess of the left sphenoid sinus. No mastoid effusion. Inner ear structures grossly normal. Other: None. MRI CERVICAL SPINE FINDINGS Alignment: Straightening of the normal cervical lordosis. No listhesis. Vertebrae: Vertebral body height maintained without evidence for acute or chronic fracture. Bone marrow signal intensity normal. No discrete or worrisome osseous lesions. No abnormal marrow edema or enhancement. Cord: Extensive signal abnormality seen involving the majority of the cervical spine, extending from the cervicomedullary junction through the upper thoracic spine, compatible with demyelinating disease/multiple sclerosis. For reference purposes, most prominent of these lesions involves the right dorsal cord at the level of C2 (series 7, image 3). No definite associated enhancement to suggest active demyelination, although evaluation somewhat limited by motion artifact. Underlying cord is somewhat atrophic in appearance. Posterior Fossa, vertebral arteries, paraspinal tissues: Unremarkable. Disc levels: No significant disc pathology seen within the cervical spine. No significant facet degeneration. No canal or neural foraminal stenosis. No impingement. MRI THORACIC SPINE FINDINGS Alignment: Physiologic with preservation of the normal thoracic kyphosis. No listhesis. Vertebrae: Vertebral body height maintained without evidence for acute or chronic fracture. Bone marrow signal  intensity within normal limits. No discrete or worrisome osseous lesions. No abnormal marrow edema or enhancement. Cord: Extensive signal abnormality seen throughout the thoracic spinal cord, essentially involving nearly all levels. Finding consistent with demyelinating disease. Patchy postcontrast enhancement involving the cord at the level of T1-2 consistent with active demyelination (series 32, image 8). Suspected additional faint patchy post-contrast enhancement at the level of T10-11 (series 32, image 8). Underlying cord is mildly atrophic in appearance. Paraspinal and other soft tissues: Unremarkable. Disc levels:  T6-7: Small central disc protrusion indents the ventral thecal sac, contacting and minimally flattening the ventral cord. No significant stenosis. No other significant disc pathology seen within the thoracic spine. No other stenosis or neural impingement. MRI LUMBAR SPINE FINDINGS Segmentation:  Standard. Alignment:  Physiologic. Vertebrae: Vertebral body height maintained without evidence for acute or chronic fracture. Bone marrow signal intensity normal. No discrete or worrisome osseous lesions. No abnormal marrow edema or enhancement. Conus medullaris: Extends to the L1-2 level. Patchy signal abnormality seen within the distal cord, consistent with demyelinating disease. Finding better evaluated on concomitant thoracic spine MRI. No appreciable abnormal enhancement. Nerve roots of the cauda equina within normal limits. Paraspinal and other soft tissues: Unremarkable. Disc levels: L1-2:  Unremarkable. L2-3:  Unremarkable. L3-4:  Unremarkable. L4-5:  Unremarkable. L5-S1: Tiny central disc protrusion with slight inferior migration. No significant spinal stenosis or neural impingement. Foramina remain patent. IMPRESSION: MRI HEAD IMPRESSION: Extensive T2/FLAIR signal abnormality scattered throughout the supratentorial and infratentorial brain, consistent with demyelinating disease/multiple sclerosis.  Associated enhancement about numerous lesions, consistent with active demyelination. MRI CERVICAL SPINE IMPRESSION: Extensive signal abnormality involving essentially the majority of the cervical spinal cord, consistent with demyelinating disease. No abnormal enhancement to suggest active demyelination. MRI THORACIC SPINE IMPRESSION: 1. Extensive signal abnormality involving essentially the entirety of the thoracic spinal cord, consistent with demyelinating disease. Patchy postcontrast enhancement at the level of T1-2 and T10-11 consistent with active demyelination. 2. Small central disc protrusion at T6-7 without significant stenosis. MRI LUMBAR SPINE IMPRESSION: 1. Patchy signal abnormality within the visualized distal cord, consistent with demyelinating disease. No abnormal enhancement to suggest active demyelination. 2. Tiny central disc protrusion at L5-S1 without stenosis or impingement. Electronically Signed   By: Rise Mu M.D.   On: 12/11/2019 22:35   MR Lumbar Spine W Wo Contrast  Result Date: 12/11/2019 CLINICAL DATA:  Initial evaluation for progressive ataxia, concern for stroke, multiple sclerosis. EXAM: MRI HEAD WITHOUT AND WITH CONTRAST MRI CERVICAL, THORACIC, AND LUMBAR SPINE WITHOUT AND WITH CONTRAST TECHNIQUE: Multisequence MR imaging of the spine from the cervical spine to the sacrum was performed prior to and following IV contrast administration for evaluation of spinal metastatic disease. CONTRAST:  5mL GADAVIST GADOBUTROL 1 MMOL/ML IV SOLN COMPARISON:  None available. FINDINGS: MRI HEAD SPINE FINDINGS Brain: Cerebral volume within normal limits for age. Innumerable scattered foci of T2/FLAIR hyperintensity seen involving the periventricular, deep, and juxta cortical white matter of both cerebral hemispheres. Many of these foci are oriented perpendicular to the lateral ventricles. Extensive involvement of the deep gray nuclei, brainstem, and cerebellar peduncles. Associated  multiple scattered T1 black holes. For reference purposes, largest focus seen at the posterior left centrum semi ovale and measures approximately 2 cm. 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 Extensive enhancement seen about multiple lesions following contrast administration, compatible with active demyelination. No evidence for acute or subacute infarct. No encephalomalacia to suggest chronic infarction. No evidence for acute or chronic intracranial hemorrhage. No mass lesion or mass effect. No hydrocephalus. No extra-axial fluid collection. Pituitary gland suprasellar region normal. Midline structures intact. Vascular: Major intracranial vascular flow voids are maintained. Skull and upper cervical spine: Craniocervical junction normal. Bone marrow signal intensity within normal limits. No scalp soft tissue abnormality. Sinuses/Orbits: Globes and orbital soft tissues within normal limits. Scattered mucosal thickening noted throughout the paranasal sinuses, with small volume layering fluid within the lateral recess of the left sphenoid sinus. No mastoid effusion. Inner ear structures grossly normal. Other: None. MRI CERVICAL  SPINE FINDINGS Alignment: Straightening of the normal cervical lordosis. No listhesis. Vertebrae: Vertebral body height maintained without evidence for acute or chronic fracture. Bone marrow signal intensity normal. No discrete or worrisome osseous lesions. No abnormal marrow edema or enhancement. Cord: Extensive signal abnormality seen involving the majority of the cervical spine, extending from the cervicomedullary junction through the upper thoracic spine, compatible with demyelinating disease/multiple sclerosis. For reference purposes, most prominent of these lesions involves the right dorsal cord at the level of C2 (series 7, image 3). No definite associated enhancement to suggest active demyelination, although evaluation somewhat limited by motion artifact.  Underlying cord is somewhat atrophic in appearance. Posterior Fossa, vertebral arteries, paraspinal tissues: Unremarkable. Disc levels: No significant disc pathology seen within the cervical spine. No significant facet degeneration. No canal or neural foraminal stenosis. No impingement. MRI THORACIC SPINE FINDINGS Alignment: Physiologic with preservation of the normal thoracic kyphosis. No listhesis. Vertebrae: Vertebral body height maintained without evidence for acute or chronic fracture. Bone marrow signal intensity within normal limits. No discrete or worrisome osseous lesions. No abnormal marrow edema or enhancement. Cord: Extensive signal abnormality seen throughout the thoracic spinal cord, essentially involving nearly all levels. Finding consistent with demyelinating disease. Patchy postcontrast enhancement involving the cord at the level of T1-2 consistent with active demyelination (series 32, image 8). Suspected additional faint patchy post-contrast enhancement at the level of T10-11 (series 32, image 8). Underlying cord is mildly atrophic in appearance. Paraspinal and other soft tissues: Unremarkable. Disc levels: T6-7: Small central disc protrusion indents the ventral thecal sac, contacting and minimally flattening the ventral cord. No significant stenosis. No other significant disc pathology seen within the thoracic spine. No other stenosis or neural impingement. MRI LUMBAR SPINE FINDINGS Segmentation:  Standard. Alignment:  Physiologic. Vertebrae: Vertebral body height maintained without evidence for acute or chronic fracture. Bone marrow signal intensity normal. No discrete or worrisome osseous lesions. No abnormal marrow edema or enhancement. Conus medullaris: Extends to the L1-2 level. Patchy signal abnormality seen within the distal cord, consistent with demyelinating disease. Finding better evaluated on concomitant thoracic spine MRI. No appreciable abnormal enhancement. Nerve roots of the cauda  equina within normal limits. Paraspinal and other soft tissues: Unremarkable. Disc levels: L1-2:  Unremarkable. L2-3:  Unremarkable. L3-4:  Unremarkable. L4-5:  Unremarkable. L5-S1: Tiny central disc protrusion with slight inferior migration. No significant spinal stenosis or neural impingement. Foramina remain patent. IMPRESSION: MRI HEAD IMPRESSION: Extensive T2/FLAIR signal abnormality scattered throughout the supratentorial and infratentorial brain, consistent with demyelinating disease/multiple sclerosis. Associated enhancement about numerous lesions, consistent with active demyelination. MRI CERVICAL SPINE IMPRESSION: Extensive signal abnormality involving essentially the majority of the cervical spinal cord, consistent with demyelinating disease. No abnormal enhancement to suggest active demyelination. MRI THORACIC SPINE IMPRESSION: 1. Extensive signal abnormality involving essentially the entirety of the thoracic spinal cord, consistent with demyelinating disease. Patchy postcontrast enhancement at the level of T1-2 and T10-11 consistent with active demyelination. 2. Small central disc protrusion at T6-7 without significant stenosis. MRI LUMBAR SPINE IMPRESSION: 1. Patchy signal abnormality within the visualized distal cord, consistent with demyelinating disease. No abnormal enhancement to suggest active demyelination. 2. Tiny central disc protrusion at L5-S1 without stenosis or impingement. Electronically Signed   By: Rise Mu M.D.   On: 12/11/2019 22:35   DG CHEST PORT 1 VIEW  Result Date: 12/12/2019 CLINICAL DATA:  Increasing weakness EXAM: PORTABLE CHEST 1 VIEW COMPARISON:  None. FINDINGS: The heart size and mediastinal contours are within normal limits. Both lungs are  clear. The visualized skeletal structures are unremarkable. IMPRESSION: No active disease. Electronically Signed   By: Monte Fantasia M.D.   On: 12/12/2019 06:22   Scheduled Meds:  pantoprazole  40 mg Oral Daily    Continuous Infusions:  methylPREDNISolone (SOLU-MEDROL) injection 1,000 mg (12/13/19 1001)    LOS: 1 day   Kerney Elbe, DO [facility] PAGER is on [place]  If 7PM-7AM, please contact night-coverage [email or web address]

## 2019-12-13 NOTE — Consult Note (Signed)
Physical Medicine and Rehabilitation Consult Reason for Consult: Decreased functional mobility Referring Physician: [facility]   HPI: Jaime [name] is a 24 y.o. right-handed male with history of depression as well as documented high functioning autism.  Per chart review patient lives with mother and 3 younger siblings.  Second floor apartment.  Poorly independent prior to admission and had been volunteering at [facility].  Presented 12/12/2019 with weakness, blurred vision and falls x3 which has progressed over the last 3 months.  On one occasion he did strike his head without loss of consciousness.  Denies any bowel or bladder disturbances.  MRI showed patchy signal abnormality within the visualized distal cord consistent with demyelinating disease.  No abnormal enhancement to suggest active demyelination.  Tiny central disc protrusion at L5-S1 without stenosis or impingement.  Admission chemistries unremarkable except potassium 3.4, SARS coronavirus negative.  LP completed CSF no growth, glucose 57, protein 33.  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  Therapy evaluations completed with recommendations of physical medicine rehab consult.  Plan is since getting better, to continue Steroids x 2 additional days.   Also pt c/o blurry vision but better with steroids- Also R hand was stiff off and on since December- hasn't taken long walks since early February.  Also mother and pt report bladder accidents- knows he needs to go, however couldn't get there fast enough.    Review of Systems  Constitutional: Negative for chills and fever.  HENT: Negative for hearing loss.   Eyes: Positive for blurred vision and double vision.  Respiratory: Negative for cough.   Cardiovascular: Negative for chest pain and palpitations.  Gastrointestinal: Positive for constipation. Negative for heartburn, nausea and vomiting.    Genitourinary: Negative for dysuria and hematuria.  Musculoskeletal: Positive for falls.  Skin: Negative for rash.  Psychiatric/Behavioral: Positive for depression.  All other systems reviewed and are negative.  Past Medical History:  Diagnosis Date  . Autism   . Depression    History reviewed. No pertinent surgical history. Family History  Problem Relation Age of Onset  . Clotting disorder Mother    Social History:  reports that he has never smoked. He has never used smokeless tobacco. He reports that he does not drink alcohol or use drugs. Allergies: No Known Allergies No medications prior to admission.    Home: Home Living Family/patient expects to be discharged to:: Private residence Living Arrangements: Parent, Other relatives(mother, 3 younger siblings) Available Help at Discharge: Family Type of Home: Apartment(possibly 2nd floor apt) Home Access: Stairs to enter Entrance Stairs-Number of Steps: flight Entrance Stairs-Rails: Right Home Layout: One level Bathroom Shower/Tub: Engineer, manufacturing systems: Standard Home Equipment: None Additional Comments: Will need to confirm information with mother as pt with slight difficulty describing details of home  Functional History: Prior Function Level of Independence: Independent Comments: Enjoys playing guitar, splatter paint, and was volunteering at [facility] [employer]. Functional Status:  Mobility: Bed Mobility Overal bed mobility: Needs Assistance Bed Mobility: Supine to Sit Supine to sit: Supervision, HOB elevated General bed mobility comments: supervision for safety Transfers Overall transfer level: Needs assistance Equipment used: 1 person hand held assist Transfers: Sit to/from Stand Sit to Stand: Min assist, +2 safety/equipment General transfer comment: assist to Ambulation/Gait Ambulation/Gait assistance: Mod assist, +2 safety/equipment Gait Distance (Feet): 20 Feet Assistive device: 1 person hand held  assist Gait Pattern/deviations: Wide base of support, Ataxic, Drifts right/left General Gait Details:  very unsteady, occasional steppage pattern, assist to maintain balance Gait velocity: decreased Gait velocity interpretation: <1.8 ft/sec, indicate of risk for recurrent falls    ADL: ADL Overall ADL's : Needs assistance/impaired Eating/Feeding: Set up, Sitting Grooming: Set up, Supervision/safety, Sitting Upper Body Bathing: Min guard, Sitting Lower Body Bathing: Moderate assistance, Sit to/from stand Upper Body Dressing : Min guard, Sitting Upper Body Dressing Details (indicate cue type and reason): Donning second gown like a jacket Lower Body Dressing: Moderate assistance, Sit to/from stand Lower Body Dressing Details (indicate cue type and reason): Noting decreased balance when bending forward to adjust sock Toilet Transfer: Moderate assistance, +2 for safety/equipment, Ambulation(simulated to recliner) Functional mobility during ADLs: Moderate assistance, +2 for safety/equipment General ADL Comments: Pt presenting with significant weakness and poor coorindation at BLEs impacting his safe performance of ADLs and mobility  Cognition: Cognition Overall Cognitive Status: Within Functional Limits for tasks assessed Orientation Level: Oriented X4 Cognition Arousal/Alertness: Awake/alert Behavior During Therapy: WFL for tasks assessed/performed Overall Cognitive Status: Within Functional Limits for tasks assessed General Comments: Baseline autism diagnosis. Very pleasant and agreeable to therapy. Follow all cues and requiring increased time for processing.   Blood pressure 102/75, pulse 90, temperature 98.6 F (37 C), temperature source Oral, resp. rate 15, height 6\' 1"  (1.854 m), weight 54.4 kg, SpO2 98 %. Physical Exam  Nursing note and vitals reviewed. Constitutional: He is oriented to person, place, and time. He appears well-developed and well-nourished.  Pt is young man  sitting up in chair at bedside; mother at bedside; pt is stiff in manner but appropriate due to autism, NAD  HENT:  Head: Normocephalic and atraumatic.  Nose: Nose normal.  Mouth/Throat: Oropharynx is clear and moist. No oropharyngeal exudate.  No facial droop; facial sensation intact; tongue is midline; shoulder shrug is intact   Eyes:  EOMI B/L however has horizontal and vertical nystagmus B/L- very pronounced.  Neck: No tracheal deviation present.  Cardiovascular: Normal rate, regular rhythm and normal heart sounds.  No murmur heard. Respiratory: Effort normal and breath sounds normal. No stridor. No respiratory distress.  CTA B/L  GI: Soft. Bowel sounds are normal. He exhibits no distension. There is no abdominal tenderness.  Musculoskeletal:     Cervical back: Normal range of motion and neck supple.     Comments: RUE- delt 4+/5, biceps 4+/5, triceps 4+/5; WE, grip 5-/5; finger abd 4+/5  LUE- deltm biceps and triceps 4+/5; WE and grip 5-/5; finger abd 4+/5  RLE and LLE- HF 4+/5, KE 4+/5, KF 4+/5; DF 4/5, and PF 4/5   Neurological: He is alert and oriented to person, place, and time.  Patient is alert no acute distress.  Speech is mildly dysarthric.  Follows commands.  Oriented to person place and time.  Limited medical historian.  Pt is stilted in manner and speech, but appropriate.  His sensation is decreased in RUE, however reports intact in LUE and LEs B/L   Skin:  Long nails and hair; a few scratches seen on hands and fingers from previous fall- a scrape on L elbow.  IV with no infiltrate.   Psychiatric:  Stilted; appropriate.     Results for orders placed or performed during the hospital encounter of 12/11/19 (from the past 24 hour(s))  Glucose, capillary     Status: Abnormal   Collection Time: 12/12/19  9:31 PM  Result Value Ref Range   Glucose-Capillary 146 (H) 70 - 99 mg/dL  CBC with Differential/Platelet     Status: Abnormal  Collection Time: 12/13/19  9:44 AM   Result Value Ref Range   WBC 25.9 (H) 4.0 - 10.5 K/uL   RBC 4.90 4.22 - 5.81 MIL/uL   Hemoglobin 15.4 13.0 - 17.0 g/dL   HCT 01.0 27.2 - 53.6 %   MCV 91.4 80.0 - 100.0 fL   MCH 31.4 26.0 - 34.0 pg   MCHC 34.4 30.0 - 36.0 g/dL   RDW 64.4 03.4 - 74.2 %   Platelets 249 150 - 400 K/uL   nRBC 0.0 0.0 - 0.2 %   Neutrophils Relative % 94 %   Neutro Abs 24.3 (H) 1.7 - 7.7 K/uL   Lymphocytes Relative 2 %   Lymphs Abs 0.5 (L) 0.7 - 4.0 K/uL   Monocytes Relative 4 %   Monocytes Absolute 1.0 0.1 - 1.0 K/uL   Eosinophils Relative 0 %   Eosinophils Absolute 0.0 0.0 - 0.5 K/uL   Basophils Relative 0 %   Basophils Absolute 0.0 0.0 - 0.1 K/uL   WBC Morphology See Note    nRBC 0 0 /100 WBC   Abs Immature Granulocytes 0.00 0.00 - 0.07 K/uL  Comprehensive metabolic panel     Status: Abnormal   Collection Time: 12/13/19  9:44 AM  Result Value Ref Range   Sodium 140 135 - 145 mmol/L   Potassium 4.1 3.5 - 5.1 mmol/L   Chloride 105 98 - 111 mmol/L   CO2 23 22 - 32 mmol/L   Glucose, Bld 145 (H) 70 - 99 mg/dL   BUN 16 6 - 20 mg/dL   Creatinine, Ser 5.95 0.61 - 1.24 mg/dL   Calcium 9.5 8.9 - 63.8 mg/dL   Total Protein 7.1 6.5 - 8.1 g/dL   Albumin 4.3 3.5 - 5.0 g/dL   AST 21 15 - 41 U/L   ALT 15 0 - 44 U/L   Alkaline Phosphatase 44 38 - 126 U/L   Total Bilirubin 1.1 0.3 - 1.2 mg/dL   GFR calc non Af Amer >60 >60 mL/min   GFR calc Af Amer >60 >60 mL/min   Anion gap 12 5 - 15  Magnesium     Status: None   Collection Time: 12/13/19  9:44 AM  Result Value Ref Range   Magnesium 1.8 1.7 - 2.4 mg/dL  Phosphorus     Status: None   Collection Time: 12/13/19  9:44 AM  Result Value Ref Range   Phosphorus 3.7 2.5 - 4.6 mg/dL   MR Brain W and Wo Contrast  Result Date: 12/11/2019 CLINICAL DATA:  Initial evaluation for progressive ataxia, concern for stroke, multiple sclerosis. EXAM: MRI HEAD WITHOUT AND WITH CONTRAST MRI CERVICAL, THORACIC, AND LUMBAR SPINE WITHOUT AND WITH CONTRAST TECHNIQUE:  Multisequence MR imaging of the spine from the cervical spine to the sacrum was performed prior to and following IV contrast administration for evaluation of spinal metastatic disease. CONTRAST:  5mL GADAVIST GADOBUTROL 1 MMOL/ML IV SOLN COMPARISON:  None available. FINDINGS: MRI HEAD SPINE FINDINGS Brain: Cerebral volume within normal limits for age. Innumerable scattered foci of T2/FLAIR hyperintensity seen involving the periventricular, deep, and juxta cortical white matter of both cerebral hemispheres. Many of these foci are oriented perpendicular to the lateral ventricles. Extensive involvement of the deep gray nuclei, brainstem, and cerebellar peduncles. Associated multiple scattered T1 black holes. For reference purposes, largest focus seen at the posterior left centrum semi ovale and measures approximately 2 cm. 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 Extensive enhancement seen about multiple lesions following contrast administration,  compatible with active demyelination. No evidence for acute or subacute infarct. No encephalomalacia to suggest chronic infarction. No evidence for acute or chronic intracranial hemorrhage. No mass lesion or mass effect. No hydrocephalus. No extra-axial fluid collection. Pituitary gland suprasellar region normal. Midline structures intact. Vascular: Major intracranial vascular flow voids are maintained. Skull and upper cervical spine: Craniocervical junction normal. Bone marrow signal intensity within normal limits. No scalp soft tissue abnormality. Sinuses/Orbits: Globes and orbital soft tissues within normal limits. Scattered mucosal thickening noted throughout the paranasal sinuses, with small volume layering fluid within the lateral recess of the left sphenoid sinus. No mastoid effusion. Inner ear structures grossly normal. Other: None. MRI CERVICAL SPINE FINDINGS Alignment: Straightening of the normal cervical lordosis. No listhesis. Vertebrae:  Vertebral body height maintained without evidence for acute or chronic fracture. Bone marrow signal intensity normal. No discrete or worrisome osseous lesions. No abnormal marrow edema or enhancement. Cord: Extensive signal abnormality seen involving the majority of the cervical spine, extending from the cervicomedullary junction through the upper thoracic spine, compatible with demyelinating disease/multiple sclerosis. For reference purposes, most prominent of these lesions involves the right dorsal cord at the level of C2 (series 7, image 3). No definite associated enhancement to suggest active demyelination, although evaluation somewhat limited by motion artifact. Underlying cord is somewhat atrophic in appearance. Posterior Fossa, vertebral arteries, paraspinal tissues: Unremarkable. Disc levels: No significant disc pathology seen within the cervical spine. No significant facet degeneration. No canal or neural foraminal stenosis. No impingement. MRI THORACIC SPINE FINDINGS Alignment: Physiologic with preservation of the normal thoracic kyphosis. No listhesis. Vertebrae: Vertebral body height maintained without evidence for acute or chronic fracture. Bone marrow signal intensity within normal limits. No discrete or worrisome osseous lesions. No abnormal marrow edema or enhancement. Cord: Extensive signal abnormality seen throughout the thoracic spinal cord, essentially involving nearly all levels. Finding consistent with demyelinating disease. Patchy postcontrast enhancement involving the cord at the level of T1-2 consistent with active demyelination (series 32, image 8). Suspected additional faint patchy post-contrast enhancement at the level of T10-11 (series 32, image 8). Underlying cord is mildly atrophic in appearance. Paraspinal and other soft tissues: Unremarkable. Disc levels: T6-7: Small central disc protrusion indents the ventral thecal sac, contacting and minimally flattening the ventral cord. No  significant stenosis. No other significant disc pathology seen within the thoracic spine. No other stenosis or neural impingement. MRI LUMBAR SPINE FINDINGS Segmentation:  Standard. Alignment:  Physiologic. Vertebrae: Vertebral body height maintained without evidence for acute or chronic fracture. Bone marrow signal intensity normal. No discrete or worrisome osseous lesions. No abnormal marrow edema or enhancement. Conus medullaris: Extends to the L1-2 level. Patchy signal abnormality seen within the distal cord, consistent with demyelinating disease. Finding better evaluated on concomitant thoracic spine MRI. No appreciable abnormal enhancement. Nerve roots of the cauda equina within normal limits. Paraspinal and other soft tissues: Unremarkable. Disc levels: L1-2:  Unremarkable. L2-3:  Unremarkable. L3-4:  Unremarkable. L4-5:  Unremarkable. L5-S1: Tiny central disc protrusion with slight inferior migration. No significant spinal stenosis or neural impingement. Foramina remain patent. IMPRESSION: MRI HEAD IMPRESSION: Extensive T2/FLAIR signal abnormality scattered throughout the supratentorial and infratentorial brain, consistent with demyelinating disease/multiple sclerosis. Associated enhancement about numerous lesions, consistent with active demyelination. MRI CERVICAL SPINE IMPRESSION: Extensive signal abnormality involving essentially the majority of the cervical spinal cord, consistent with demyelinating disease. No abnormal enhancement to suggest active demyelination. MRI THORACIC SPINE IMPRESSION: 1. Extensive signal abnormality involving essentially the entirety of the thoracic spinal  cord, consistent with demyelinating disease. Patchy postcontrast enhancement at the level of T1-2 and T10-11 consistent with active demyelination. 2. Small central disc protrusion at T6-7 without significant stenosis. MRI LUMBAR SPINE IMPRESSION: 1. Patchy signal abnormality within the visualized distal cord, consistent with  demyelinating disease. No abnormal enhancement to suggest active demyelination. 2. Tiny central disc protrusion at L5-S1 without stenosis or impingement. Electronically Signed   By: Rise Mu M.D.   On: 12/11/2019 22:35   MR CERVICAL SPINE W WO CONTRAST  Result Date: 12/11/2019 CLINICAL DATA:  Initial evaluation for progressive ataxia, concern for stroke, multiple sclerosis. EXAM: MRI HEAD WITHOUT AND WITH CONTRAST MRI CERVICAL, THORACIC, AND LUMBAR SPINE WITHOUT AND WITH CONTRAST TECHNIQUE: Multisequence MR imaging of the spine from the cervical spine to the sacrum was performed prior to and following IV contrast administration for evaluation of spinal metastatic disease. CONTRAST:  10mL GADAVIST GADOBUTROL 1 MMOL/ML IV SOLN COMPARISON:  None available. FINDINGS: MRI HEAD SPINE FINDINGS Brain: Cerebral volume within normal limits for age. Innumerable scattered foci of T2/FLAIR hyperintensity seen involving the periventricular, deep, and juxta cortical white matter of both cerebral hemispheres. Many of these foci are oriented perpendicular to the lateral ventricles. Extensive involvement of the deep gray nuclei, brainstem, and cerebellar peduncles. Associated multiple scattered T1 black holes. For reference purposes, largest focus seen at the posterior left centrum semi ovale and measures approximately 2 cm. 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 Extensive enhancement seen about multiple lesions following contrast administration, compatible with active demyelination. No evidence for acute or subacute infarct. No encephalomalacia to suggest chronic infarction. No evidence for acute or chronic intracranial hemorrhage. No mass lesion or mass effect. No hydrocephalus. No extra-axial fluid collection. Pituitary gland suprasellar region normal. Midline structures intact. Vascular: Major intracranial vascular flow voids are maintained. Skull and upper cervical spine: Craniocervical  junction normal. Bone marrow signal intensity within normal limits. No scalp soft tissue abnormality. Sinuses/Orbits: Globes and orbital soft tissues within normal limits. Scattered mucosal thickening noted throughout the paranasal sinuses, with small volume layering fluid within the lateral recess of the left sphenoid sinus. No mastoid effusion. Inner ear structures grossly normal. Other: None. MRI CERVICAL SPINE FINDINGS Alignment: Straightening of the normal cervical lordosis. No listhesis. Vertebrae: Vertebral body height maintained without evidence for acute or chronic fracture. Bone marrow signal intensity normal. No discrete or worrisome osseous lesions. No abnormal marrow edema or enhancement. Cord: Extensive signal abnormality seen involving the majority of the cervical spine, extending from the cervicomedullary junction through the upper thoracic spine, compatible with demyelinating disease/multiple sclerosis. For reference purposes, most prominent of these lesions involves the right dorsal cord at the level of C2 (series 7, image 3). No definite associated enhancement to suggest active demyelination, although evaluation somewhat limited by motion artifact. Underlying cord is somewhat atrophic in appearance. Posterior Fossa, vertebral arteries, paraspinal tissues: Unremarkable. Disc levels: No significant disc pathology seen within the cervical spine. No significant facet degeneration. No canal or neural foraminal stenosis. No impingement. MRI THORACIC SPINE FINDINGS Alignment: Physiologic with preservation of the normal thoracic kyphosis. No listhesis. Vertebrae: Vertebral body height maintained without evidence for acute or chronic fracture. Bone marrow signal intensity within normal limits. No discrete or worrisome osseous lesions. No abnormal marrow edema or enhancement. Cord: Extensive signal abnormality seen throughout the thoracic spinal cord, essentially involving nearly all levels. Finding  consistent with demyelinating disease. Patchy postcontrast enhancement involving the cord at the level of T1-2 consistent with active demyelination (series 32, image  8). Suspected additional faint patchy post-contrast enhancement at the level of T10-11 (series 32, image 8). Underlying cord is mildly atrophic in appearance. Paraspinal and other soft tissues: Unremarkable. Disc levels: T6-7: Small central disc protrusion indents the ventral thecal sac, contacting and minimally flattening the ventral cord. No significant stenosis. No other significant disc pathology seen within the thoracic spine. No other stenosis or neural impingement. MRI LUMBAR SPINE FINDINGS Segmentation:  Standard. Alignment:  Physiologic. Vertebrae: Vertebral body height maintained without evidence for acute or chronic fracture. Bone marrow signal intensity normal. No discrete or worrisome osseous lesions. No abnormal marrow edema or enhancement. Conus medullaris: Extends to the L1-2 level. Patchy signal abnormality seen within the distal cord, consistent with demyelinating disease. Finding better evaluated on concomitant thoracic spine MRI. No appreciable abnormal enhancement. Nerve roots of the cauda equina within normal limits. Paraspinal and other soft tissues: Unremarkable. Disc levels: L1-2:  Unremarkable. L2-3:  Unremarkable. L3-4:  Unremarkable. L4-5:  Unremarkable. L5-S1: Tiny central disc protrusion with slight inferior migration. No significant spinal stenosis or neural impingement. Foramina remain patent. IMPRESSION: MRI HEAD IMPRESSION: Extensive T2/FLAIR signal abnormality scattered throughout the supratentorial and infratentorial brain, consistent with demyelinating disease/multiple sclerosis. Associated enhancement about numerous lesions, consistent with active demyelination. MRI CERVICAL SPINE IMPRESSION: Extensive signal abnormality involving essentially the majority of the cervical spinal cord, consistent with demyelinating  disease. No abnormal enhancement to suggest active demyelination. MRI THORACIC SPINE IMPRESSION: 1. Extensive signal abnormality involving essentially the entirety of the thoracic spinal cord, consistent with demyelinating disease. Patchy postcontrast enhancement at the level of T1-2 and T10-11 consistent with active demyelination. 2. Small central disc protrusion at T6-7 without significant stenosis. MRI LUMBAR SPINE IMPRESSION: 1. Patchy signal abnormality within the visualized distal cord, consistent with demyelinating disease. No abnormal enhancement to suggest active demyelination. 2. Tiny central disc protrusion at L5-S1 without stenosis or impingement. Electronically Signed   By: Rise Mu M.D.   On: 12/11/2019 22:35   MR THORACIC SPINE W WO CONTRAST  Result Date: 12/11/2019 CLINICAL DATA:  Initial evaluation for progressive ataxia, concern for stroke, multiple sclerosis. EXAM: MRI HEAD WITHOUT AND WITH CONTRAST MRI CERVICAL, THORACIC, AND LUMBAR SPINE WITHOUT AND WITH CONTRAST TECHNIQUE: Multisequence MR imaging of the spine from the cervical spine to the sacrum was performed prior to and following IV contrast administration for evaluation of spinal metastatic disease. CONTRAST:  5mL GADAVIST GADOBUTROL 1 MMOL/ML IV SOLN COMPARISON:  None available. FINDINGS: MRI HEAD SPINE FINDINGS Brain: Cerebral volume within normal limits for age. Innumerable scattered foci of T2/FLAIR hyperintensity seen involving the periventricular, deep, and juxta cortical white matter of both cerebral hemispheres. Many of these foci are oriented perpendicular to the lateral ventricles. Extensive involvement of the deep gray nuclei, brainstem, and cerebellar peduncles. Associated multiple scattered T1 black holes. For reference purposes, largest focus seen at the posterior left centrum semi ovale and measures approximately 2 cm. 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 Extensive enhancement  seen about multiple lesions following contrast administration, compatible with active demyelination. No evidence for acute or subacute infarct. No encephalomalacia to suggest chronic infarction. No evidence for acute or chronic intracranial hemorrhage. No mass lesion or mass effect. No hydrocephalus. No extra-axial fluid collection. Pituitary gland suprasellar region normal. Midline structures intact. Vascular: Major intracranial vascular flow voids are maintained. Skull and upper cervical spine: Craniocervical junction normal. Bone marrow signal intensity within normal limits. No scalp soft tissue abnormality. Sinuses/Orbits: Globes and orbital soft tissues within normal limits. Scattered mucosal thickening  noted throughout the paranasal sinuses, with small volume layering fluid within the lateral recess of the left sphenoid sinus. No mastoid effusion. Inner ear structures grossly normal. Other: None. MRI CERVICAL SPINE FINDINGS Alignment: Straightening of the normal cervical lordosis. No listhesis. Vertebrae: Vertebral body height maintained without evidence for acute or chronic fracture. Bone marrow signal intensity normal. No discrete or worrisome osseous lesions. No abnormal marrow edema or enhancement. Cord: Extensive signal abnormality seen involving the majority of the cervical spine, extending from the cervicomedullary junction through the upper thoracic spine, compatible with demyelinating disease/multiple sclerosis. For reference purposes, most prominent of these lesions involves the right dorsal cord at the level of C2 (series 7, image 3). No definite associated enhancement to suggest active demyelination, although evaluation somewhat limited by motion artifact. Underlying cord is somewhat atrophic in appearance. Posterior Fossa, vertebral arteries, paraspinal tissues: Unremarkable. Disc levels: No significant disc pathology seen within the cervical spine. No significant facet degeneration. No canal or  neural foraminal stenosis. No impingement. MRI THORACIC SPINE FINDINGS Alignment: Physiologic with preservation of the normal thoracic kyphosis. No listhesis. Vertebrae: Vertebral body height maintained without evidence for acute or chronic fracture. Bone marrow signal intensity within normal limits. No discrete or worrisome osseous lesions. No abnormal marrow edema or enhancement. Cord: Extensive signal abnormality seen throughout the thoracic spinal cord, essentially involving nearly all levels. Finding consistent with demyelinating disease. Patchy postcontrast enhancement involving the cord at the level of T1-2 consistent with active demyelination (series 32, image 8). Suspected additional faint patchy post-contrast enhancement at the level of T10-11 (series 32, image 8). Underlying cord is mildly atrophic in appearance. Paraspinal and other soft tissues: Unremarkable. Disc levels: T6-7: Small central disc protrusion indents the ventral thecal sac, contacting and minimally flattening the ventral cord. No significant stenosis. No other significant disc pathology seen within the thoracic spine. No other stenosis or neural impingement. MRI LUMBAR SPINE FINDINGS Segmentation:  Standard. Alignment:  Physiologic. Vertebrae: Vertebral body height maintained without evidence for acute or chronic fracture. Bone marrow signal intensity normal. No discrete or worrisome osseous lesions. No abnormal marrow edema or enhancement. Conus medullaris: Extends to the L1-2 level. Patchy signal abnormality seen within the distal cord, consistent with demyelinating disease. Finding better evaluated on concomitant thoracic spine MRI. No appreciable abnormal enhancement. Nerve roots of the cauda equina within normal limits. Paraspinal and other soft tissues: Unremarkable. Disc levels: L1-2:  Unremarkable. L2-3:  Unremarkable. L3-4:  Unremarkable. L4-5:  Unremarkable. L5-S1: Tiny central disc protrusion with slight inferior migration. No  significant spinal stenosis or neural impingement. Foramina remain patent. IMPRESSION: MRI HEAD IMPRESSION: Extensive T2/FLAIR signal abnormality scattered throughout the supratentorial and infratentorial brain, consistent with demyelinating disease/multiple sclerosis. Associated enhancement about numerous lesions, consistent with active demyelination. MRI CERVICAL SPINE IMPRESSION: Extensive signal abnormality involving essentially the majority of the cervical spinal cord, consistent with demyelinating disease. No abnormal enhancement to suggest active demyelination. MRI THORACIC SPINE IMPRESSION: 1. Extensive signal abnormality involving essentially the entirety of the thoracic spinal cord, consistent with demyelinating disease. Patchy postcontrast enhancement at the level of T1-2 and T10-11 consistent with active demyelination. 2. Small central disc protrusion at T6-7 without significant stenosis. MRI LUMBAR SPINE IMPRESSION: 1. Patchy signal abnormality within the visualized distal cord, consistent with demyelinating disease. No abnormal enhancement to suggest active demyelination. 2. Tiny central disc protrusion at L5-S1 without stenosis or impingement. Electronically Signed   By: Rise Mu M.D.   On: 12/11/2019 22:35   MR Lumbar Spine W Wo  Contrast  Result Date: 12/11/2019 CLINICAL DATA:  Initial evaluation for progressive ataxia, concern for stroke, multiple sclerosis. EXAM: MRI HEAD WITHOUT AND WITH CONTRAST MRI CERVICAL, THORACIC, AND LUMBAR SPINE WITHOUT AND WITH CONTRAST TECHNIQUE: Multisequence MR imaging of the spine from the cervical spine to the sacrum was performed prior to and following IV contrast administration for evaluation of spinal metastatic disease. CONTRAST:  62mL GADAVIST GADOBUTROL 1 MMOL/ML IV SOLN COMPARISON:  None available. FINDINGS: MRI HEAD SPINE FINDINGS Brain: Cerebral volume within normal limits for age. Innumerable scattered foci of T2/FLAIR hyperintensity seen  involving the periventricular, deep, and juxta cortical white matter of both cerebral hemispheres. Many of these foci are oriented perpendicular to the lateral ventricles. Extensive involvement of the deep gray nuclei, brainstem, and cerebellar peduncles. Associated multiple scattered T1 black holes. For reference purposes, largest focus seen at the posterior left centrum semi ovale and measures approximately 2 cm. 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 Extensive enhancement seen about multiple lesions following contrast administration, compatible with active demyelination. No evidence for acute or subacute infarct. No encephalomalacia to suggest chronic infarction. No evidence for acute or chronic intracranial hemorrhage. No mass lesion or mass effect. No hydrocephalus. No extra-axial fluid collection. Pituitary gland suprasellar region normal. Midline structures intact. Vascular: Major intracranial vascular flow voids are maintained. Skull and upper cervical spine: Craniocervical junction normal. Bone marrow signal intensity within normal limits. No scalp soft tissue abnormality. Sinuses/Orbits: Globes and orbital soft tissues within normal limits. Scattered mucosal thickening noted throughout the paranasal sinuses, with small volume layering fluid within the lateral recess of the left sphenoid sinus. No mastoid effusion. Inner ear structures grossly normal. Other: None. MRI CERVICAL SPINE FINDINGS Alignment: Straightening of the normal cervical lordosis. No listhesis. Vertebrae: Vertebral body height maintained without evidence for acute or chronic fracture. Bone marrow signal intensity normal. No discrete or worrisome osseous lesions. No abnormal marrow edema or enhancement. Cord: Extensive signal abnormality seen involving the majority of the cervical spine, extending from the cervicomedullary junction through the upper thoracic spine, compatible with demyelinating disease/multiple  sclerosis. For reference purposes, most prominent of these lesions involves the right dorsal cord at the level of C2 (series 7, image 3). No definite associated enhancement to suggest active demyelination, although evaluation somewhat limited by motion artifact. Underlying cord is somewhat atrophic in appearance. Posterior Fossa, vertebral arteries, paraspinal tissues: Unremarkable. Disc levels: No significant disc pathology seen within the cervical spine. No significant facet degeneration. No canal or neural foraminal stenosis. No impingement. MRI THORACIC SPINE FINDINGS Alignment: Physiologic with preservation of the normal thoracic kyphosis. No listhesis. Vertebrae: Vertebral body height maintained without evidence for acute or chronic fracture. Bone marrow signal intensity within normal limits. No discrete or worrisome osseous lesions. No abnormal marrow edema or enhancement. Cord: Extensive signal abnormality seen throughout the thoracic spinal cord, essentially involving nearly all levels. Finding consistent with demyelinating disease. Patchy postcontrast enhancement involving the cord at the level of T1-2 consistent with active demyelination (series 32, image 8). Suspected additional faint patchy post-contrast enhancement at the level of T10-11 (series 32, image 8). Underlying cord is mildly atrophic in appearance. Paraspinal and other soft tissues: Unremarkable. Disc levels: T6-7: Small central disc protrusion indents the ventral thecal sac, contacting and minimally flattening the ventral cord. No significant stenosis. No other significant disc pathology seen within the thoracic spine. No other stenosis or neural impingement. MRI LUMBAR SPINE FINDINGS Segmentation:  Standard. Alignment:  Physiologic. Vertebrae: Vertebral body height maintained without evidence for acute  or chronic fracture. Bone marrow signal intensity normal. No discrete or worrisome osseous lesions. No abnormal marrow edema or enhancement.  Conus medullaris: Extends to the L1-2 level. Patchy signal abnormality seen within the distal cord, consistent with demyelinating disease. Finding better evaluated on concomitant thoracic spine MRI. No appreciable abnormal enhancement. Nerve roots of the cauda equina within normal limits. Paraspinal and other soft tissues: Unremarkable. Disc levels: L1-2:  Unremarkable. L2-3:  Unremarkable. L3-4:  Unremarkable. L4-5:  Unremarkable. L5-S1: Tiny central disc protrusion with slight inferior migration. No significant spinal stenosis or neural impingement. Foramina remain patent. IMPRESSION: MRI HEAD IMPRESSION: Extensive T2/FLAIR signal abnormality scattered throughout the supratentorial and infratentorial brain, consistent with demyelinating disease/multiple sclerosis. Associated enhancement about numerous lesions, consistent with active demyelination. MRI CERVICAL SPINE IMPRESSION: Extensive signal abnormality involving essentially the majority of the cervical spinal cord, consistent with demyelinating disease. No abnormal enhancement to suggest active demyelination. MRI THORACIC SPINE IMPRESSION: 1. Extensive signal abnormality involving essentially the entirety of the thoracic spinal cord, consistent with demyelinating disease. Patchy postcontrast enhancement at the level of T1-2 and T10-11 consistent with active demyelination. 2. Small central disc protrusion at T6-7 without significant stenosis. MRI LUMBAR SPINE IMPRESSION: 1. Patchy signal abnormality within the visualized distal cord, consistent with demyelinating disease. No abnormal enhancement to suggest active demyelination. 2. Tiny central disc protrusion at L5-S1 without stenosis or impingement. Electronically Signed   By: Rise Mu M.D.   On: 12/11/2019 22:35   DG CHEST PORT 1 VIEW  Result Date: 12/12/2019 CLINICAL DATA:  Increasing weakness EXAM: PORTABLE CHEST 1 VIEW COMPARISON:  None. FINDINGS: The heart size and mediastinal contours are  within normal limits. Both lungs are clear. The visualized skeletal structures are unremarkable. IMPRESSION: No active disease. Electronically Signed   By: Marnee Spring M.D.   On: 12/12/2019 06:22     Assessment/Plan: Diagnosis: Questionable MS- initial symptoms/signs 1. Does the need for close, 24 hr/day medical supervision in concert with the patient's rehab needs make it unreasonable for this patient to be served in a less intensive setting? Yes 2. Co-Morbidities requiring supervision/potential complications: autism, neurogenic bladder, increased risk of DVT< elevated BGs on steroids, nystagmus and blurry vision.  3. Due to bladder management, bowel management, safety, skin/wound care, disease management, medication administration and patient education, does the patient require 24 hr/day rehab nursing? Yes 4. Does the patient require coordinated care of a physician, rehab nurse, therapy disciplines of PT, OT and possibly SLP to address physical and functional deficits in the context of the above medical diagnosis(es)? Yes Addressing deficits in the following areas: balance, endurance, locomotion, strength, transferring, bowel/bladder control, bathing, dressing, feeding, grooming, toileting, language and psychosocial support 5. Can the patient actively participate in an intensive therapy program of at least 3 hrs of therapy per day at least 5 days per week? Yes 6. The potential for patient to make measurable gains while on inpatient rehab is good 7. Anticipated functional outcomes upon discharge from inpatient rehab are modified independent and supervision  with PT, modified independent and supervision with OT, n/a with SLP. 8. Estimated rehab length of stay to reach the above functional goals is: 7-10 days 9. Anticipated discharge destination: Home 10. Overall Rehab/Functional Prognosis: good  RECOMMENDATIONS: This patient's condition is appropriate for continued rehabilitative care in the  following setting: CIR Patient has agreed to participate in recommended program. Potentially Note that insurance prior authorization may be required for reimbursement for recommended care.  Comment:  1. Discussed with pt and  mother that although MS is a potential dx, and highest on differential, is not clear for sure at this time- definitively.  2. Discussed length of rehab- likely 5-10 days depending on progress with steroids-  3. Will need to get set up with Neurology long term- which they are aware of.  4. Will monitor pt for signs of spasticity.  5. C/o bladder accidents- might need to see Urology outpt.  6. Thank you for this consult- a good CIR candidate once done with steroids.     Mcarthur Rossetti Angiulli, PA-C 12/13/2019   I have personally performed a face to face diagnostic evaluation of this patient and formulated the key components of the plan.  Additionally, I have personally reviewed laboratory data, imaging studies, as well as relevant notes and concur with the physician assistant's documentation above.   The patient's status has not changed from the original consult.  Any changes in documentation from the acute care chart have been noted above.

## 2019-12-13 NOTE — Evaluation (Signed)
Physical Therapy Evaluation Patient Details Name: Jaime [name] MRN: [id number] DOB: Feb 20, 1996 Today's Date: 12/13/2019   History of Present Illness  24 yo male presenting with increasing weakness and difficulty with ambulation over last three months. Several falls recently. MRI of the C-spine, T-spine, and L-spine ordered and showed diffuse demyelinating lesions concerning for MS. PMH including autism.     Clinical Impression  Pt admitted with above diagnosis. PTA pt lived at home with his mom and 3 younger siblings. He enjoys art and playing the guitar. He was independent with mobility and ADLs. On eval, he required supervision bed mobility, +2 min assist transfers and mod/HHA ambulation x 20' +2 safety/lines. He presents with deficits in strength, coordination and balance. Pt currently with functional limitations due to the deficits listed below (see PT Problem List). Pt will benefit from skilled PT to increase their independence and safety with mobility to allow discharge to the venue listed below.       Follow Up Recommendations CIR    Equipment Recommendations  Other (comment)(TBD)    Recommendations for Other Services Rehab consult     Precautions / Restrictions Precautions Precautions: Fall      Mobility  Bed Mobility Overal bed mobility: Needs Assistance Bed Mobility: Supine to Sit     Supine to sit: Supervision;HOB elevated     General bed mobility comments: supervision for safety  Transfers Overall transfer level: Needs assistance Equipment used: 1 person hand held assist Transfers: Sit to/from Stand Sit to Stand: Min assist;+2 safety/equipment         General transfer comment: assist to  Ambulation/Gait Ambulation/Gait assistance: Mod assist;+2 safety/equipment Gait Distance (Feet): 20 Feet Assistive device: 1 person hand held assist Gait Pattern/deviations: Wide base of support;Ataxic;Drifts right/left Gait velocity: decreased Gait velocity  interpretation: <1.8 ft/sec, indicate of risk for recurrent falls General Gait Details: very unsteady, occasional steppage pattern, assist to maintain balance  Stairs            Wheelchair Mobility    Modified Rankin (Stroke Patients Only)       Balance Overall balance assessment: Needs assistance Sitting-balance support: No upper extremity supported;Feet supported Sitting balance-Leahy Scale: Fair     Standing balance support: During functional activity;Single extremity supported Standing balance-Leahy Scale: Poor Standing balance comment: reliant on external support                             Pertinent Vitals/Pain Pain Assessment: Faces Faces Pain Scale: Hurts a little bit Pain Location: IV site Pain Descriptors / Indicators: Constant Pain Intervention(s): Monitored during session;Repositioned    Home Living Family/patient expects to be discharged to:: Private residence Living Arrangements: Parent;Other relatives(mother, 3 younger siblings) Available Help at Discharge: Family Type of Home: Apartment(possibly 2nd floor apt) Home Access: Stairs to enter Entrance Stairs-Rails: Right Entrance Stairs-Number of Steps: flight Home Layout: One level Home Equipment: None Additional Comments: Will need to confirm information with mother as pt with slight difficulty describing details of home    Prior Function Level of Independence: Independent         Comments: Enjoys playing guitar, splatter paint, and was volunteering at [facility].     Hand Dominance   Dominant Hand: Right    Extremity/Trunk Assessment   Upper Extremity Assessment Upper Extremity Assessment: Defer to OT evaluation    Lower Extremity Assessment Lower Extremity Assessment: LLE deficits/detail;RLE deficits/detail;Generalized weakness RLE Deficits / Details: able to move through full active  range, trembling/muscle fatigue noted RLE Sensation: decreased proprioception RLE  Coordination: decreased gross motor LLE Deficits / Details: able to move through full active range, trembling/muscle fatigue noted LLE Sensation: decreased proprioception LLE Coordination: decreased gross motor    Cervical / Trunk Assessment Cervical / Trunk Assessment: Normal  Communication   Communication: No difficulties(benefits from increased time for processing)  Cognition Arousal/Alertness: Awake/alert Behavior During Therapy: WFL for tasks assessed/performed Overall Cognitive Status: Within Functional Limits for tasks assessed                                 General Comments: Baseline autism diagnosis. Very pleasant and agreeable to therapy. Follow all cues and requiring increased time for processing.       General Comments General comments (skin integrity, edema, etc.): HR to 130s with activity    Exercises     Assessment/Plan    PT Assessment Patient needs continued PT services  PT Problem List Decreased strength;Decreased mobility;Decreased coordination;Decreased balance;Decreased activity tolerance       PT Treatment Interventions DME instruction;Therapeutic activities;Gait training;Therapeutic exercise;Patient/family education;Balance training;Stair training;Functional mobility training    PT Goals (Current goals can be found in the Care Plan section)  Acute Rehab PT Goals Patient Stated Goal: Be able to play guitar again PT Goal Formulation: With patient Time For Goal Achievement: 12/27/19 Potential to Achieve Goals: Good    Frequency Min 3X/week   Barriers to discharge        Co-evaluation PT/OT/SLP Co-Evaluation/Treatment: Yes Reason for Co-Treatment: For patient/therapist safety PT goals addressed during session: Mobility/safety with mobility;Balance OT goals addressed during session: ADL's and self-care       AM-PAC PT "6 Clicks" Mobility  Outcome Measure Help needed turning from your back to your side while in a flat bed without  using bedrails?: None Help needed moving from lying on your back to sitting on the side of a flat bed without using bedrails?: A Little Help needed moving to and from a bed to a chair (including a wheelchair)?: A Lot Help needed standing up from a chair using your arms (e.g., wheelchair or bedside chair)?: A Little Help needed to walk in hospital room?: A Lot Help needed climbing 3-5 steps with a railing? : Total 6 Click Score: 15    End of Session Equipment Utilized During Treatment: Gait belt Activity Tolerance: Patient tolerated treatment well Patient left: in chair;with chair alarm set;with call bell/phone within reach Nurse Communication: Mobility status PT Visit Diagnosis: Unsteadiness on feet (R26.81);Muscle weakness (generalized) (M62.81)    Time: 0814-4818 PT Time Calculation (min) (ACUTE ONLY): 23 min   Charges:   PT Evaluation $PT Eval Moderate Complexity: 1 Mod          Aida Raider, PT  Office # [phone] Pager [phone]   Ilda Foil 12/13/2019, 1:02 PM

## 2019-12-14 LAB — COMPREHENSIVE METABOLIC PANEL
ALT: 13 U/L (ref 0–44)
AST: 16 U/L (ref 15–41)
Albumin: 3.7 g/dL (ref 3.5–5.0)
Alkaline Phosphatase: 38 U/L (ref 38–126)
Anion gap: 13 (ref 5–15)
BUN: 12 mg/dL (ref 6–20)
CO2: 25 mmol/L (ref 22–32)
Calcium: 9.6 mg/dL (ref 8.9–10.3)
Chloride: 103 mmol/L (ref 98–111)
Creatinine, Ser: 0.85 mg/dL (ref 0.61–1.24)
GFR calc Af Amer: >60 mL/min (ref >60)
GFR calc non Af Amer: >60 mL/min (ref >60)
Glucose, Bld: 120 mg/dL — ABNORMAL HIGH (ref 70–99)
Potassium: 4 mmol/L (ref 3.5–5.1)
Sodium: 141 mmol/L (ref 135–145)
Total Bilirubin: 0.9 mg/dL (ref 0.3–1.2)
Total Protein: 6.2 g/dL — ABNORMAL LOW (ref 6.5–8.1)

## 2019-12-14 LAB — MAGNESIUM: Magnesium: 1.8 mg/dL (ref 1.7–2.4)

## 2019-12-14 LAB — CBC WITH DIFFERENTIAL/PLATELET
Abs Immature Granulocytes: 0.26 10*3/uL — ABNORMAL HIGH (ref 0.00–0.07)
Basophils Absolute: 0 10*3/uL (ref 0.0–0.1)
Basophils Relative: 0 %
Eosinophils Absolute: 0 10*3/uL (ref 0.0–0.5)
Eosinophils Relative: 0 %
HCT: 40.8 % (ref 39.0–52.0)
Hemoglobin: 14.1 g/dL (ref 13.0–17.0)
Immature Granulocytes: 1 %
Lymphocytes Relative: 3 %
Lymphs Abs: 0.9 10*3/uL (ref 0.7–4.0)
MCH: 31.5 pg (ref 26.0–34.0)
MCHC: 34.6 g/dL (ref 30.0–36.0)
MCV: 91.1 fL (ref 80.0–100.0)
Monocytes Absolute: 1.5 10*3/uL — ABNORMAL HIGH (ref 0.1–1.0)
Monocytes Relative: 6 %
Neutro Abs: 22.7 10*3/uL — ABNORMAL HIGH (ref 1.7–7.7)
Neutrophils Relative %: 90 %
Platelets: 239 10*3/uL (ref 150–400)
RBC: 4.48 MIL/uL (ref 4.22–5.81)
RDW: 13.5 % (ref 11.5–15.5)
WBC: 25.3 10*3/uL — ABNORMAL HIGH (ref 4.0–10.5)
nRBC: 0 % (ref 0.0–0.2)

## 2019-12-14 LAB — PHOSPHORUS: Phosphorus: 4.9 mg/dL — ABNORMAL HIGH (ref 2.5–4.6)

## 2019-12-14 LAB — HEMOGLOBIN A1C
Hgb A1c MFr Bld: 5.3 % (ref 4.8–5.6)
Mean Plasma Glucose: 105.41 mg/dL

## 2019-12-14 LAB — COPPER, SERUM: Copper: 58 ug/dL — ABNORMAL LOW (ref 63–121)

## 2019-12-14 LAB — NEUROMYELITIS OPTICA AUTOAB, IGG: NMO-IgG: <1.5 U/mL (ref 0.0–3.0)

## 2019-12-14 LAB — ANGIOTENSIN CONVERTING ENZYME, CSF: Angio Convert Enzyme: <1.5 U/L (ref 0.0–2.5)

## 2019-12-14 MED ORDER — MELATONIN 3 MG PO TABS
6.0000 mg | ORAL_TABLET | Freq: Every evening | ORAL | Status: AC | PRN
  Administered 2019-12-14 – 2019-12-16 (×3): 6 mg via ORAL
  Filled 2019-12-14 (×4): qty 2

## 2019-12-14 MED ORDER — ENOXAPARIN SODIUM 40 MG/0.4ML ~~LOC~~ SOLN
40.0000 mg | SUBCUTANEOUS | Status: DC
  Administered 2019-12-14 – 2019-12-17 (×4): 40 mg via SUBCUTANEOUS
  Filled 2019-12-14 (×4): qty 0.4

## 2019-12-14 NOTE — Progress Notes (Signed)
Physical Therapy Treatment Patient Details Name: Jaime [name] MRN: [id number] DOB: Mar 28, 1996 Today's Date: 12/14/2019    History of Present Illness 24 yo male presenting with increasing weakness and difficulty with ambulation over last three months. Several falls recently. MRI of the C-spine, T-spine, and L-spine ordered and showed diffuse demyelinating lesions concerning for MS. PMH including autism.     PT Comments    Pt received in recliner, agreeable to participation in therapy. He required min assist sit to stand. Attempted gait trial without AD. Pt very unsteady with ataxic gait pattern. RW then utilized for ambulation 20' in room mod assist. He continues to present with decreased coordination and proprioception BLE. Current POC remains appropriate.    Follow Up Recommendations  CIR     Equipment Recommendations  Other (comment)(TBD)    Recommendations for Other Services       Precautions / Restrictions Precautions Precautions: Fall    Mobility  Bed Mobility               General bed mobility comments: Pt received in recliner.  Transfers Overall transfer level: Needs assistance Equipment used: Ambulation equipment used Transfers: Sit to/from Stand Sit to Stand: Min assist         General transfer comment: assist to power up and stabilize balance  Ambulation/Gait Ambulation/Gait assistance: Mod assist Gait Distance (Feet): 20 Feet Assistive device: Rolling walker (2 wheeled) Gait Pattern/deviations: Wide base of support;Ataxic;Drifts right/left Gait velocity: decreased, guarded gait Gait velocity interpretation: <1.8 ft/sec, indicate of risk for recurrent falls General Gait Details: assist to maintain balance, more steady with RW   Stairs             Wheelchair Mobility    Modified Rankin (Stroke Patients Only)       Balance Overall balance assessment: Needs assistance Sitting-balance support: No upper extremity supported;Feet  supported Sitting balance-Leahy Scale: Fair     Standing balance support: During functional activity;Single extremity supported;Bilateral upper extremity supported Standing balance-Leahy Scale: Poor Standing balance comment: reliant on external support                            Cognition Arousal/Alertness: Awake/alert Behavior During Therapy: WFL for tasks assessed/performed Overall Cognitive Status: Within Functional Limits for tasks assessed                                 General Comments: Autism at baseline. Stiff but appropriate. Increased time for processing.      Exercises      General Comments General comments (skin integrity, edema, etc.): VSS      Pertinent Vitals/Pain Pain Assessment: Faces Faces Pain Scale: Hurts a little bit Pain Location: IV site Pain Descriptors / Indicators: Constant Pain Intervention(s): Monitored during session    Home Living                      Prior Function            PT Goals (current goals can now be found in the care plan section) Acute Rehab PT Goals Patient Stated Goal: to walk Progress towards PT goals: Progressing toward goals    Frequency    Min 3X/week      PT Plan Current plan remains appropriate    Co-evaluation              AM-PAC PT "6  Clicks" Mobility   Outcome Measure  Help needed turning from your back to your side while in a flat bed without using bedrails?: None Help needed moving from lying on your back to sitting on the side of a flat bed without using bedrails?: A Little Help needed moving to and from a bed to a chair (including a wheelchair)?: A Lot Help needed standing up from a chair using your arms (e.g., wheelchair or bedside chair)?: A Little Help needed to walk in hospital room?: A Lot Help needed climbing 3-5 steps with a railing? : Total 6 Click Score: 15    End of Session Equipment Utilized During Treatment: Gait belt Activity Tolerance:  Patient tolerated treatment well Patient left: in chair;with chair alarm set;with call bell/phone within reach Nurse Communication: Mobility status PT Visit Diagnosis: Unsteadiness on feet (R26.81);Muscle weakness (generalized) (M62.81)     Time: 0300-9233 PT Time Calculation (min) (ACUTE ONLY): 20 min  Charges:  $Gait Training: 8-22 mins                     Lorrin Goodell, PT  Office # [phone] Pager [phone]    Lorriane Shire 12/14/2019, 1:44 PM

## 2019-12-14 NOTE — Progress Notes (Signed)
PROGRESS NOTE    Jaime [name]  SAY:[id number] DOB: 1996-03-26 DOA: 12/11/2019 PCP: Patient, No Pcp Per   Brief Narrative:  The patient is a 24 year old with a past medical history significant for autism currently not on any medication who started experience weakness and difficulty ambulating over the last 2 months.  This is progressively worsened and he had 3 falls while walking his dog and he did hit his head in the process did not lose consciousness.  Mother had stated that he had been stumbling while walking and started to walk like a toddler and over the last few weeks patient had blurred vision.  He was brought to the emergency room for further evaluation and per the patient's mother he had a prolonged respiratory tract infection 2 months ago in January during which she did have a persistent productive cough.  He had an MRI of the C-spine, T-spine, and L-spine ordered and showed diffuse demyelinating lesions.  Neurology was consulted and recommended the patient be started on Solu-Medrol 1 g daily for 5 days.  Lumbar puncture was done as well and patient was transferred to [facility] for further evaluation.  Currently he is being evaluated and treated for demyelinating disease of central nervous system with concern for multiple sclerosis versus other CNS lymphomatous process.  Patient is undergoing IV Solu-Medrol for 5 days and the plan is for him to go to CIR after completion of his steroids if Neurology has no further recommendations.  Assessment & Plan:   Principal Problem:   Demyelinating disease of central nervous system (HCC) Active Problems:   Lower extremity weakness   MS (multiple sclerosis) (HCC)   Demyelinating disease of the central nervous system concerning for Multiple Sclerosis.  -Other differentials includes neuromyelitis optica neurosarcoid and CNS lymphomatous's versus other CNS lymphomatous process.  -Patient has been started on IV Solu-Medrol 1 g daily for 5  days; Solu-Medrol on 12/16/2018 -Follow-up CSF labs and showed a IgG serum of 1343, IgG CSF of 7.2, CSF IgG index of 1.1, and Ig G/albumin ratio of CSF of 0.33, nonreactive RPR, negative and nonreactive HIV antibodies, RBC count of 58 in the CSF, WBC of 3, with CSF culture pending and Gram stain showing few WBCs predominantly mononuclear -Cytology of the CSF showed no malignant cells -Checked ACElevels and was 17 -Anti-Jo 1 antibody was less than 0.2, double-stranded DNA antibody was 9, ENA SM antibody serum-aCnc, ribonucleic protein was less than 0.2, SSA Ro and SSB La antibody IgG was less than 0.2 and scleroderma SCL 70 ENA antibody was less than 0.2 -Neurology consulted for further evaluation and recommendations and appreciate further assistance in management -Obtain PT and OT to evaluate and treat they are recommending CIR and we have consulted the rehab admissions coordinator as well as the rehab MD; patient appears to be a good candidate for CIR once he has completed his -Neurology recommends possibly extending Solu-Medrol for 2 more days after the 5-day dosing based on clinical response -They also recommend considering plasma exchange if clinically appropriate -He will need to follow-up with Dr. Despina Arias at discharge -Per neurology he may be a candidate for Tysabri or Ocrevus in outpatient setting -Checking a urinalysis and chest x-ray to make sure there is no evidence of infection as he is currently now on steroids  and urinalysis was unremarkable and chest x-ray showed no active disease -We will add Protonix PPI for GI prophylaxis -Appreciate further neurological assistance and evaluation recommendations  History of Autism  -  Presently not on any medication. -Is High Functioning   Hypokalemia, improved  -His potassium level this morning was 4.0 -Continue to monitor and replete as necessary  -Repeat CMP in a.m.  Leukocytosis -Likely in the setting of steroid demargination as  WBC went from 10-> 12.7 -> 25.9 -> 25.3 -Continue monitor and trend and Monitor for S/Sx of Infection  -Repeat CBC in a.m.   Hyperglycemia -In the setting of steroid demargination -Patient's blood sugar on admission was normal at 89 on his BMP and repeat CMP this morning showed a blood glucose of 120 -Continue to monitor and trend blood sugars carefully and if necessary will place on symptoms NovoLog sliding scale insulin AC -CBG was 146 on random check  -May consider obtaining a hemoglobin A1c to rule out diabetes component  Hyperphosphatemia -Mild as patient's Phos Level went from 3.7 -> 4.9 -Continue to Monitor and Trend  -Repeat Phos Level in AM   DVT prophylaxis: SCDs; Will start Lovenox 40 mg sq Code Status: FULL CODE  Family Communication: No family present at bedside and will attempt to call the mother later for an update Disposition Plan: Patient is from home and now requiring an increased level of care and PT OT recommending CIR.  Will need to continue with IV Solu-Medrol and complete the regimen prior to discharge along with having Neurology clearance   Consultants:   Neurology   Procedures: None  Antimicrobials:  Anti-infectives (From admission, onward)   None     Subjective: Seen and examined at bedside and he was resting in states that he got little bit more sleep than his previous night.  Is a little bit more sleepy this morning.  No chest pain, lightheadedness or dizziness.  No nausea or vomiting.  No pain anywhere.  No other concerns or complaints at this time.  Objective: Vitals:   12/13/19 1940 12/13/19 2354 12/14/19 0342 12/14/19 0756  BP: 110/71 115/70 106/68 115/72  Pulse: 88 (!) 57 77 (!) 52  Resp: 14 15 16 13   Temp: 98 F (36.7 C) 98 F (36.7 C) 98.1 F (36.7 C) (!) 97.5 F (36.4 C)  TempSrc: Oral Oral Oral Oral  SpO2: 99% 99% 97% 99%  Weight:      Height:       No intake or output data in the 24 hours ending 12/14/19 0833 Filed Weights    12/11/19 1607  Weight: 54.4 kg   Examination: Physical Exam:  Constitutional: Thin Male in NAD and appears calm and comfortable and does appear a little sleepy Eyes: Lids and conjunctivae normal, sclerae anicteric  ENMT: External Ears, Nose appear normal. Grossly normal hearing. Neck: Appears normal, supple, no cervical masses, normal ROM, no appreciable thyromegaly; no JVD Respiratory: Diminished to auscultation bilaterally, no wheezing, rales, rhonchi or crackles. Normal respiratory effort and patient is not tachypenic. No accessory muscle use. Unlabored breathing Cardiovascular: RRR with HR in the 70's, no murmurs / rubs / gallops. S1 and S2 auscultated. No extremity edema.  Abdomen: Soft, non-tender, non-distended. Bowel sounds positive.  GU: Deferred. Musculoskeletal: No clubbing / cyanosis of digits/nails. No joint deformity upper and lower extremities.  Skin: No rashes, lesions, ulcers on a limited skin evaluation. No induration; Warm and dry.  Neurologic: CN 2-12 grossly intact with no focal deficits. Romberg sign and cerebellar reflexes not assessed.  Psychiatric: Normal judgment and insight. Alert and oriented x 3 but is a little sleepy. Normal mood and appropriate affect.   Data Reviewed: I have personally reviewed  following labs and imaging studies  CBC: Recent Labs  Lab 12/11/19 2110 12/12/19 0605 12/13/19 0944 12/14/19 0619  WBC 10.1 12.7* 25.9* 25.3*  NEUTROABS 7.5 11.7* 24.3* PENDING  HGB 14.9 15.5 15.4 14.1  HCT 43.3 44.3 44.8 40.8  MCV 91.4 90.2 91.4 91.1  PLT 207 219 249 026   Basic Metabolic Panel: Recent Labs  Lab 12/11/19 2110 12/12/19 0605 12/13/19 0944 12/14/19 0619  NA 141 138 140 141  K 3.4* 3.4* 4.1 4.0  CL 109 108 105 103  CO2 23 23 23 25   GLUCOSE 89 150* 145* 120*  BUN 13 18 16 12   CREATININE 0.80 0.90 1.08 0.85  CALCIUM 9.2 9.3 9.5 9.6  MG  --   --  1.8 1.8  PHOS  --   --  3.7 4.9*   GFR: Estimated Creatinine Clearance: 104 mL/min  (by C-G formula based on SCr of 0.85 mg/dL). Liver Function Tests: Recent Labs  Lab 12/12/19 0002 12/12/19 0605 12/13/19 0944 12/14/19 0619  AST  --  17 21 16   ALT  --  16 15 13   ALKPHOS  --  41 44 38  BILITOT  --  1.0 1.1 0.9  PROT  --  7.4 7.1 6.2*  ALBUMIN 4.6 4.4 4.3 3.7   No results for input(s): LIPASE, AMYLASE in the last 168 hours. No results for input(s): AMMONIA in the last 168 hours. Coagulation Profile: No results for input(s): INR, PROTIME in the last 168 hours. Cardiac Enzymes: No results for input(s): CKTOTAL, CKMB, CKMBINDEX, TROPONINI in the last 168 hours. BNP (last 3 results) No results for input(s): PROBNP in the last 8760 hours. HbA1C: No results for input(s): HGBA1C in the last 72 hours. CBG: Recent Labs  Lab 12/12/19 2131  GLUCAP 146*   Lipid Profile: No results for input(s): CHOL, HDL, LDLCALC, TRIG, CHOLHDL, LDLDIRECT in the last 72 hours. Thyroid Function Tests: No results for input(s): TSH, T4TOTAL, FREET4, T3FREE, THYROIDAB in the last 72 hours. Anemia Panel: Recent Labs    12/11/19 2110  VITAMINB12 678   Sepsis Labs: No results for input(s): PROCALCITON, LATICACIDVEN in the last 168 hours.  Recent Results (from the past 240 hour(s))  SARS CORONAVIRUS 2 (TAT 6-24 HRS) Nasopharyngeal Nasopharyngeal Swab     Status: None   Collection Time: 12/12/19 12:02 AM   Specimen: Nasopharyngeal Swab  Result Value Ref Range Status   SARS Coronavirus 2 NEGATIVE NEGATIVE Final    Comment: (NOTE) SARS-CoV-2 target nucleic acids are NOT DETECTED. The SARS-CoV-2 RNA is generally detectable in upper and lower respiratory specimens during the acute phase of infection. Negative results do not preclude SARS-CoV-2 infection, do not rule out co-infections with other pathogens, and should not be used as the sole basis for treatment or other patient management decisions. Negative results must be combined with clinical observations, patient history, and  epidemiological information. The expected result is Negative. Fact Sheet for Patients: SugarRoll.be Fact Sheet for Healthcare Providers: https://www.woods-mathews.com/ This test is not yet approved or cleared by the [place] FDA and  has been authorized for detection and/or diagnosis of SARS-CoV-2 by FDA under an Emergency Use Authorization (EUA). This EUA will remain  in effect (meaning this test can be used) for the duration of the COVID-19 declaration under Section 56 4(b)(1) of the Act, 21 U.S.C. section 360bbb-3(b)(1), unless the authorization is terminated or revoked sooner. Performed at [facility], [place] [address] [place], [place] [address]   CSF culture  Status: None (Preliminary result)   Collection Time: 12/12/19 12:02 AM   Specimen: CSF; Cerebrospinal Fluid  Result Value Ref Range Status   Specimen Description CSF  Final   Special Requests   Final    NONE Performed at [facility], [address], [place], [place] [address]    Gram Stain   Final    FEW WBC PRESENT, PREDOMINANTLY MONONUCLEAR NO ORGANISMS SEEN RESULT CALLED TO, READ BACK BY AND VERIFIED WITH: RN KATIE AT 0132 12/12/19 CRUICKSHANK A Performed at [facility], [address] [place], [place] [address]    Culture   Final    NO GROWTH 1 DAY Performed at [facility], [address] [place], [place] [address]    Report Status PENDING  Incomplete  MRSA PCR Screening     Status: None   Collection Time: 12/12/19  9:52 AM   Specimen: Nasal Mucosa; Nasopharyngeal  Result Value Ref Range Status   MRSA by PCR NEGATIVE NEGATIVE Final    Comment:        The GeneXpert MRSA Assay (FDA approved for NASAL specimens only), is one component of a comprehensive MRSA colonization surveillance program. It is not intended to diagnose MRSA infection nor to guide or monitor treatment for MRSA infections. Performed at  [facility], [address] [place], [place] [address]     RN Pressure Injury Documentation:     Estimated body mass index is 15.83 kg/m as calculated from the following:   Height as of this encounter: 6\' 1"  (1.854 m).   Weight as of this encounter: 54.4 kg.  Malnutrition Type:      Malnutrition Characteristics:      Nutrition Interventions:   Radiology Studies: No results found. Scheduled Meds: . pantoprazole  40 mg Oral Daily   Continuous Infusions: . methylPREDNISolone (SOLU-MEDROL) injection 1,000 mg (12/13/19 1001)    LOS: 2 days   02/12/20, DO [facility] PAGER is on AMION  If 7PM-7AM, please contact night-coverage [email or web address]

## 2019-12-14 NOTE — Progress Notes (Signed)
Inpatient Rehab Admissions:  Inpatient Rehab Consult received.  I met with patient at the bedside for rehabilitation assessment and to discuss goals and expectations of an inpatient rehab admission.  He is open to CIR, once steroids have completed.  Will f/u with pt and mother on Monday to confirm dispo and discuss cost of CIR.    Signed: Shann Medal, PT, DPT Admissions Coordinator [phone] 12/14/19  4:38 PM

## 2019-12-15 LAB — COMPREHENSIVE METABOLIC PANEL
ALT: 13 U/L (ref 0–44)
AST: 17 U/L (ref 15–41)
Albumin: 3.8 g/dL (ref 3.5–5.0)
Alkaline Phosphatase: 39 U/L (ref 38–126)
Anion gap: 10 (ref 5–15)
BUN: 13 mg/dL (ref 6–20)
CO2: 26 mmol/L (ref 22–32)
Calcium: 9.1 mg/dL (ref 8.9–10.3)
Chloride: 104 mmol/L (ref 98–111)
Creatinine, Ser: 0.88 mg/dL (ref 0.61–1.24)
GFR calc Af Amer: >60 mL/min (ref >60)
GFR calc non Af Amer: >60 mL/min (ref >60)
Glucose, Bld: 122 mg/dL — ABNORMAL HIGH (ref 70–99)
Potassium: 3.7 mmol/L (ref 3.5–5.1)
Sodium: 140 mmol/L (ref 135–145)
Total Bilirubin: 0.6 mg/dL (ref 0.3–1.2)
Total Protein: 6.5 g/dL (ref 6.5–8.1)

## 2019-12-15 LAB — CBC WITH DIFFERENTIAL/PLATELET
Abs Immature Granulocytes: 0.19 10*3/uL — ABNORMAL HIGH (ref 0.00–0.07)
Basophils Absolute: 0 10*3/uL (ref 0.0–0.1)
Basophils Relative: 0 %
Eosinophils Absolute: 0 10*3/uL (ref 0.0–0.5)
Eosinophils Relative: 0 %
HCT: 40.7 % (ref 39.0–52.0)
Hemoglobin: 14.1 g/dL (ref 13.0–17.0)
Immature Granulocytes: 1 %
Lymphocytes Relative: 3 %
Lymphs Abs: 0.7 10*3/uL (ref 0.7–4.0)
MCH: 31.8 pg (ref 26.0–34.0)
MCHC: 34.6 g/dL (ref 30.0–36.0)
MCV: 91.9 fL (ref 80.0–100.0)
Monocytes Absolute: 1.5 10*3/uL — ABNORMAL HIGH (ref 0.1–1.0)
Monocytes Relative: 6 %
Neutro Abs: 21.5 10*3/uL — ABNORMAL HIGH (ref 1.7–7.7)
Neutrophils Relative %: 90 %
Platelets: 230 10*3/uL (ref 150–400)
RBC: 4.43 MIL/uL (ref 4.22–5.81)
RDW: 13.4 % (ref 11.5–15.5)
WBC: 23.9 10*3/uL — ABNORMAL HIGH (ref 4.0–10.5)
nRBC: 0 % (ref 0.0–0.2)

## 2019-12-15 LAB — CSF CULTURE W GRAM STAIN: Culture: NO GROWTH

## 2019-12-15 LAB — PHOSPHORUS: Phosphorus: 3.1 mg/dL (ref 2.5–4.6)

## 2019-12-15 LAB — MAGNESIUM: Magnesium: 2 mg/dL (ref 1.7–2.4)

## 2019-12-15 NOTE — Progress Notes (Signed)
Occupational Therapy Treatment Patient Details Name: Jaime [name] MRN: [id number] DOB: 06-27-1996 Today's Date: 12/15/2019    History of present illness 24 yo male presenting with increasing weakness and difficulty with ambulation over last three months. Several falls recently. MRI of the C-spine, T-spine, and L-spine ordered and showed diffuse demyelinating lesions concerning for MS. PMH including autism.    OT comments  Pt progressing towards acute OT goals. Focus of session was functional mobility navigating turns and obstacles in the room. Utilized rolling walker this session. Worked on dynamic standing balance during simulated grooming task. D/c plan remains appropriate.    Follow Up Recommendations  CIR;Supervision/Assistance - 24 hour    Equipment Recommendations  Other (comment)(defer to next venue)    Recommendations for Other Services      Precautions / Restrictions Precautions Precautions: Fall Restrictions Weight Bearing Restrictions: No       Mobility Bed Mobility               General bed mobility comments: Pt received in recliner.  Transfers Overall transfer level: Needs assistance Equipment used: Rolling walker (2 wheeled) Transfers: Sit to/from Stand Sit to Stand: Min assist         General transfer comment: assist to power up and stabilize balance. cues for technique with rw.    Balance Overall balance assessment: Needs assistance Sitting-balance support: No upper extremity supported;Feet supported Sitting balance-Leahy Scale: Fair     Standing balance support: During functional activity;Single extremity supported;Bilateral upper extremity supported Standing balance-Leahy Scale: Poor Standing balance comment: reliant on external support                           ADL either performed or assessed with clinical judgement   ADL Overall ADL's : Needs assistance/impaired     Grooming: Set  up;Supervision/safety;Sitting Grooming Details (indicate cue type and reason): brielfy able to stand at sink with single extremity support. Unsteadiness noted.                  Toilet Transfer: Minimal assistance;Ambulation;RW(3n1 over toilet. simulated.)           Functional mobility during ADLs: Minimal assistance;+2 for safety/equipment;Rolling walker General ADL Comments: Min A for steadying due to weakness and impaired coordination. Utilized rw this session to ambulate in the room navigating turns and obstacles.     Vision       Perception     Praxis      Cognition Arousal/Alertness: Awake/alert Behavior During Therapy: WFL for tasks assessed/performed Overall Cognitive Status: Within Functional Limits for tasks assessed                                 General Comments: Autism at baseline. Stiff but appropriate. Increased time for processing.        Exercises     Shoulder Instructions       General Comments      Pertinent Vitals/ Pain       Pain Assessment: No/denies pain  Home Living                                          Prior Functioning/Environment              Frequency  Min 2X/week  Progress Toward Goals  OT Goals(current goals can now be found in the care plan section)  Progress towards OT goals: Progressing toward goals  Acute Rehab OT Goals Patient Stated Goal: to walk OT Goal Formulation: With patient Time For Goal Achievement: 12/27/19 Potential to Achieve Goals: Good ADL Goals Pt Will Perform Grooming: with supervision;standing Pt Will Perform Lower Body Dressing: with set-up;with supervision;sit to/from stand Pt Will Transfer to Toilet: with set-up;with supervision;ambulating;bedside commode Pt Will Perform Toileting - Clothing Manipulation and hygiene: with set-up;with supervision;sit to/from stand;sitting/lateral leans Pt Will Perform Tub/Shower Transfer: with set-up;with  supervision;ambulating;3 in 1 Additional ADL Goal #1: Pt will demonstrate increased activity tolerance to perform three grooming tasks in standing with Supervision  Plan Discharge plan remains appropriate    Co-evaluation                 AM-PAC OT "6 Clicks" Daily Activity     Outcome Measure   Help from another person eating meals?: None Help from another person taking care of personal grooming?: A Little Help from another person toileting, which includes using toliet, bedpan, or urinal?: A Lot Help from another person bathing (including washing, rinsing, drying)?: A Lot Help from another person to put on and taking off regular upper body clothing?: A Little Help from another person to put on and taking off regular lower body clothing?: A Lot 6 Click Score: 16    End of Session Equipment Utilized During Treatment: Gait belt;Rolling walker  OT Visit Diagnosis: Unsteadiness on feet (R26.81);Other abnormalities of gait and mobility (R26.89);Muscle weakness (generalized) (M62.81)   Activity Tolerance Patient tolerated treatment well   Patient Left in chair;with call bell/phone within reach;with chair alarm set   Nurse Communication          Time: [phone] OT Time Calculation (min): 15 min  Charges: OT General Charges $OT Visit: 1 Visit OT Treatments $Self Care/Home Management : 8-22 mins  Tyrone Schimke, OT Acute Rehabilitation Services Pager: [phone] Office: [phone]    Hortencia Pilar 12/15/2019, 1:10 PM

## 2019-12-15 NOTE — Progress Notes (Signed)
PROGRESS NOTE    Jaime [name]  BPZ:[id number] DOB: [birth year] DOA: 12/11/2019 PCP: Patient, No Pcp Per   Brief Narrative:  The patient is a 24 year old with a past medical history significant for autism currently not on any medication who started experience weakness and difficulty ambulating over the last 2 months.  This is progressively worsened and he had 3 falls while walking his dog and he did hit his head in the process did not lose consciousness.  Mother had stated that he had been stumbling while walking and started to walk like a toddler and over the last few weeks patient had blurred vision.  He was brought to the emergency room for further evaluation and per the patient's mother he had a prolonged respiratory tract infection 2 months ago in January during which she did have a persistent productive cough.  He had an MRI of the C-spine, T-spine, and L-spine ordered and showed diffuse demyelinating lesions.  Neurology was consulted and recommended the patient be started on Solu-Medrol 1 g daily for 5 days.  Lumbar puncture was done as well and patient was transferred to [facility] for further evaluation.  Currently he is being evaluated and treated for demyelinating disease of central nervous system with concern for multiple sclerosis versus other CNS lymphomatous process.  Patient is undergoing IV Solu-Medrol for 5 days and the plan is for him to go to CIR after completion of his steroids if Neurology has no further recommendations.  Assessment & Plan:   Principal Problem:   Demyelinating disease of central nervous system (HCC) Active Problems:   Lower extremity weakness   MS (multiple sclerosis) (HCC)   Demyelinating disease of the central nervous system concerning for Multiple Sclerosis.  -Other differentials includes neuromyelitis optica neurosarcoid and CNS lymphomatous's versus other CNS lymphomatous process.  -Patient has been started on IV Solu-Medrol 1 g daily for 5  days; Solu-Medrol on 12/16/2018 -Follow-up CSF labs and showed a IgG serum of 1343, IgG CSF of 7.2, CSF IgG index of 1.1, and Ig G/albumin ratio of CSF of 0.33, nonreactive RPR, negative and nonreactive HIV antibodies, RBC count of 58 in the CSF, WBC of 3, with CSF culture showed No Organisms and NGTD at 3 Days and Gram stain showing few WBCs predominantly mononuclear -Cytology of the CSF showed no malignant cells -Checked ACElevels and was 17 -Anti-Jo 1 antibody was less than 0.2, double-stranded DNA antibody was 9, ENA SM antibody serum-aCnc, ribonucleic protein was less than 0.2, SSA Ro and SSB La antibody IgG was less than 0.2 and scleroderma SCL 70 ENA antibody was less than 0.2 -Neurology consulted for further evaluation and recommendations and appreciate further assistance in management -Obtain PT and OT to evaluate and treat they are recommending CIR and we have consulted the rehab admissions coordinator as well as the rehab MD; patient appears to be a good candidate for CIR once he has completed his -Neurology recommends possibly extending Solu-Medrol for 2 more days after the 5-day dosing based on clinical response -They also recommend considering plasma exchange if clinically appropriate -He will need to follow-up with Dr. Despina Arias at discharge -Per neurology he may be a candidate for Tysabri or Ocrevus in outpatient setting -Checking a urinalysis and chest x-ray to make sure there is no evidence of infection as he is currently now on steroids  and urinalysis was unremarkable and chest x-ray showed no active disease -We will add Protonix PPI for GI prophylaxis due to high dose steroids  to prevent ulceration  -Appreciate further neurological assistance and evaluation recommendations  History of Autism  -Presently not on any medication. -Is High Functioning   Hypokalemia, improved  -His potassium level yesterday morning was 4.0; Repeat this AM is still pending  -Continue to  monitor and replete as necessary  -Repeat CMP in a.m.  Leukocytosis -Likely in the setting of steroid demargination as WBC went from 10-> 12.7 -> 25.9 -> 25.3 yesterday; Repeat is pending this AM  -Continue monitor and trend and Monitor for S/Sx of Infection  -Repeat CBC in a.m.   Hyperglycemia -In the setting of steroid demargination -Patient's blood sugar on admission was normal at 89 on his BMP and repeat CMP yesterdaymorning showed a blood glucose of 120; Repeat this AM is pending  -Continue to monitor and trend blood sugars carefully and if necessary will place on symptoms NovoLog sliding scale insulin AC -CBG was 146 on random check  -May consider obtaining a hemoglobin A1c to rule out diabetes component  Hyperphosphatemia -Mild as patient's Phos Level went from 3.7 -> 4.9 yesterday; Repeat pending this AM  -Continue to Monitor and Trend  -Repeat Phos Level in AM   DVT prophylaxis: SCDs; Will start Lovenox 40 mg sq Code Status: FULL CODE  Family Communication: No family present at bedside today Disposition Plan: Patient is from home and now requiring an increased level of care and PT OT recommending CIR.  Will need to continue with IV Solu-Medrol and complete the regimen prior to discharge along with having Neurology clearance   Consultants:   Neurology   Procedures: None  Antimicrobials:  Anti-infectives (From admission, onward)   None     Subjective: Seen and examined at bedside and he was sitting in the chair and had no issues overnight. Not in any pain and states he slept ok. Does not know if he is getting any stronger. Worked with Therapy but Neurology team has not been yet. No other concerns or complaints at this time.   Objective: Vitals:   12/14/19 2036 12/15/19 0038 12/15/19 0332 12/15/19 0820  BP: 115/71 110/78 110/74 115/68  Pulse: 80 63 73 65  Resp: 18 14 14 18   Temp: 98 F (36.7 C) 97.7 F (36.5 C) 98 F (36.7 C) 98.1 F (36.7 C)  TempSrc: Oral  Oral Oral Oral  SpO2: 97% 97% 97% 99%  Weight:      Height:        Intake/Output Summary (Last 24 hours) at 12/15/2019 0847 Last data filed at 12/14/2019 1830 Gross per 24 hour  Intake 480 ml  Output 500 ml  Net -20 ml   Filed Weights   12/11/19 1607  Weight: 54.4 kg   Examination: Physical Exam:  Constitutional: Thin male in NAD and appears calm and comfortable sitting in the chair at bedside Eyes: Lids and conjunctivae normal, sclerae anicteric  ENMT: External Ears, Nose appear normal. Grossly normal hearing. Mucous membranes are moist.  Neck: Appears normal, supple, no cervical masses, normal ROM, no appreciable thyromegaly; no JVD Respiratory: Clear to auscultation bilaterally, no wheezing, rales, rhonchi or crackles. Normal respiratory effort and patient is not tachypenic. No accessory muscle use.  Cardiovascular: RRR with HR in the high 80's, no murmurs / rubs / gallops. S1 and S2 auscultated. No extremity edema. Abdomen: Soft, non-tender, non-distended. Bowel sounds positive.  GU: Deferred. Musculoskeletal: No clubbing / cyanosis of digits/nails. No joint deformity upper and lower extremities. Skin: No rashes, lesions, ulcers on a limited skin evaluation. No  induration; Warm and dry.  Neurologic: CN 2-12 grossly intact with no focal deficits.Romberg sign and cerebellar reflexes not assessed.  Psychiatric: Normal judgment and insight. Alert and oriented x 3. Normal mood and appropriate affect.   Data Reviewed: I have personally reviewed following labs and imaging studies  CBC: Recent Labs  Lab 12/11/19 2110 12/12/19 0605 12/13/19 0944 12/14/19 0619  WBC 10.1 12.7* 25.9* 25.3*  NEUTROABS 7.5 11.7* 24.3* 22.7*  HGB 14.9 15.5 15.4 14.1  HCT 43.3 44.3 44.8 40.8  MCV 91.4 90.2 91.4 91.1  PLT 207 219 249 378   Basic Metabolic Panel: Recent Labs  Lab 12/11/19 2110 12/12/19 0605 12/13/19 0944 12/14/19 0619  NA 141 138 140 141  K 3.4* 3.4* 4.1 4.0  CL 109 108 105  103  CO2 23 23 23 25   GLUCOSE 89 150* 145* 120*  BUN 13 18 16 12   CREATININE 0.80 0.90 1.08 0.85  CALCIUM 9.2 9.3 9.5 9.6  MG  --   --  1.8 1.8  PHOS  --   --  3.7 4.9*   GFR: Estimated Creatinine Clearance: 104 mL/min (by C-G formula based on SCr of 0.85 mg/dL). Liver Function Tests: Recent Labs  Lab 12/12/19 0002 12/12/19 0605 12/13/19 0944 12/14/19 0619  AST  --  17 21 16   ALT  --  16 15 13   ALKPHOS  --  41 44 38  BILITOT  --  1.0 1.1 0.9  PROT  --  7.4 7.1 6.2*  ALBUMIN 4.6 4.4 4.3 3.7   No results for input(s): LIPASE, AMYLASE in the last 168 hours. No results for input(s): AMMONIA in the last 168 hours. Coagulation Profile: No results for input(s): INR, PROTIME in the last 168 hours. Cardiac Enzymes: No results for input(s): CKTOTAL, CKMB, CKMBINDEX, TROPONINI in the last 168 hours. BNP (last 3 results) No results for input(s): PROBNP in the last 8760 hours. HbA1C: Recent Labs    12/14/19 0619  HGBA1C 5.3   CBG: Recent Labs  Lab 12/12/19 2131  GLUCAP 146*   Lipid Profile: No results for input(s): CHOL, HDL, LDLCALC, TRIG, CHOLHDL, LDLDIRECT in the last 72 hours. Thyroid Function Tests: No results for input(s): TSH, T4TOTAL, FREET4, T3FREE, THYROIDAB in the last 72 hours. Anemia Panel: No results for input(s): VITAMINB12, FOLATE, FERRITIN, TIBC, IRON, RETICCTPCT in the last 72 hours. Sepsis Labs: No results for input(s): PROCALCITON, LATICACIDVEN in the last 168 hours.  Recent Results (from the past 240 hour(s))  SARS CORONAVIRUS 2 (TAT 6-24 HRS) Nasopharyngeal Nasopharyngeal Swab     Status: None   Collection Time: 12/12/19 12:02 AM   Specimen: Nasopharyngeal Swab  Result Value Ref Range Status   SARS Coronavirus 2 NEGATIVE NEGATIVE Final    Comment: (NOTE) SARS-CoV-2 target nucleic acids are NOT DETECTED. The SARS-CoV-2 RNA is generally detectable in upper and lower respiratory specimens during the acute phase of infection. Negative results do  not preclude SARS-CoV-2 infection, do not rule out co-infections with other pathogens, and should not be used as the sole basis for treatment or other patient management decisions. Negative results must be combined with clinical observations, patient history, and epidemiological information. The expected result is Negative. Fact Sheet for Patients: SugarRoll.be Fact Sheet for Healthcare Providers: https://www.woods-mathews.com/ This test is not yet approved or cleared by the [place] FDA and  has been authorized for detection and/or diagnosis of SARS-CoV-2 by FDA under an Emergency Use Authorization (EUA). This EUA will remain  in effect (meaning this test  can be used) for the duration of the COVID-19 declaration under Section 56 4(b)(1) of the Act, 21 U.S.C. section 360bbb-3(b)(1), unless the authorization is terminated or revoked sooner. Performed at [facility], [address], [place], [place] [address]   CSF culture     Status: None   Collection Time: 12/12/19 12:02 AM   Specimen: CSF; Cerebrospinal Fluid  Result Value Ref Range Status   Specimen Description CSF  Final   Special Requests   Final    NONE Performed at [facility], [address], [place], [place] [address]    Gram Stain   Final    FEW WBC PRESENT, PREDOMINANTLY MONONUCLEAR NO ORGANISMS SEEN RESULT CALLED TO, READ BACK BY AND VERIFIED WITH: RN KATIE AT 0132 12/12/19 CRUICKSHANK A Performed at [facility], [address] [place], [place] [address]    Culture   Final    NO GROWTH 3 DAYS Performed at [facility], [address] [place], [place] [address]    Report Status 12/15/2019 FINAL  Final  MRSA PCR Screening     Status: None   Collection Time: 12/12/19  9:52 AM   Specimen: Nasal Mucosa; Nasopharyngeal  Result Value Ref Range Status   MRSA by PCR NEGATIVE NEGATIVE Final    Comment:        The GeneXpert  MRSA Assay (FDA approved for NASAL specimens only), is one component of a comprehensive MRSA colonization surveillance program. It is not intended to diagnose MRSA infection nor to guide or monitor treatment for MRSA infections. Performed at [facility], [address], [place], [place] [address]     RN Pressure Injury Documentation:     Estimated body mass index is 15.83 kg/m as calculated from the following:   Height as of this encounter: 6\' 1"  (1.854 m).   Weight as of this encounter: 54.4 kg.  Malnutrition Type:      Malnutrition Characteristics:      Nutrition Interventions:   Radiology Studies: No results found. Scheduled Meds: . enoxaparin (LOVENOX) injection  40 mg Subcutaneous Q24H  . pantoprazole  40 mg Oral Daily   Continuous Infusions: . methylPREDNISolone (SOLU-MEDROL) injection 1,000 mg (12/14/19 1003)    LOS: 3 days   02/13/20, DO [facility] PAGER is on AMION  If 7PM-7AM, please contact night-coverage [email or web address]

## 2019-12-16 ENCOUNTER — Inpatient Hospital Stay (HOSPITAL_COMMUNITY): Payer: Medicaid Other

## 2019-12-16 LAB — COMPREHENSIVE METABOLIC PANEL
ALT: 14 U/L (ref 0–44)
AST: 19 U/L (ref 15–41)
Albumin: 3.3 g/dL — ABNORMAL LOW (ref 3.5–5.0)
Alkaline Phosphatase: 34 U/L — ABNORMAL LOW (ref 38–126)
Anion gap: 5 (ref 5–15)
BUN: 14 mg/dL (ref 6–20)
CO2: 27 mmol/L (ref 22–32)
Calcium: 8.8 mg/dL — ABNORMAL LOW (ref 8.9–10.3)
Chloride: 106 mmol/L (ref 98–111)
Creatinine, Ser: 0.87 mg/dL (ref 0.61–1.24)
GFR calc Af Amer: >60 mL/min (ref >60)
GFR calc non Af Amer: >60 mL/min (ref >60)
Glucose, Bld: 112 mg/dL — ABNORMAL HIGH (ref 70–99)
Potassium: 4 mmol/L (ref 3.5–5.1)
Sodium: 138 mmol/L (ref 135–145)
Total Bilirubin: 0.5 mg/dL (ref 0.3–1.2)
Total Protein: 5.8 g/dL — ABNORMAL LOW (ref 6.5–8.1)

## 2019-12-16 LAB — CBC WITH DIFFERENTIAL/PLATELET
Abs Immature Granulocytes: 0.24 10*3/uL — ABNORMAL HIGH (ref 0.00–0.07)
Basophils Absolute: 0 10*3/uL (ref 0.0–0.1)
Basophils Relative: 0 %
Eosinophils Absolute: 0 10*3/uL (ref 0.0–0.5)
Eosinophils Relative: 0 %
HCT: 39.2 % (ref 39.0–52.0)
Hemoglobin: 13.5 g/dL (ref 13.0–17.0)
Immature Granulocytes: 1 %
Lymphocytes Relative: 4 %
Lymphs Abs: 0.7 10*3/uL (ref 0.7–4.0)
MCH: 31.5 pg (ref 26.0–34.0)
MCHC: 34.4 g/dL (ref 30.0–36.0)
MCV: 91.4 fL (ref 80.0–100.0)
Monocytes Absolute: 1.1 10*3/uL — ABNORMAL HIGH (ref 0.1–1.0)
Monocytes Relative: 5 %
Neutro Abs: 17.9 10*3/uL — ABNORMAL HIGH (ref 1.7–7.7)
Neutrophils Relative %: 90 %
Platelets: 201 10*3/uL (ref 150–400)
RBC: 4.29 MIL/uL (ref 4.22–5.81)
RDW: 13.4 % (ref 11.5–15.5)
WBC: 19.9 10*3/uL — ABNORMAL HIGH (ref 4.0–10.5)
nRBC: 0 % (ref 0.0–0.2)

## 2019-12-16 LAB — MAGNESIUM: Magnesium: 2.1 mg/dL (ref 1.7–2.4)

## 2019-12-16 LAB — PHOSPHORUS: Phosphorus: 4.4 mg/dL (ref 2.5–4.6)

## 2019-12-16 MED ORDER — GADOBUTROL 1 MMOL/ML IV SOLN
5.4000 mL | Freq: Once | INTRAVENOUS | Status: AC | PRN
  Administered 2019-12-16: 5.4 mL via INTRAVENOUS

## 2019-12-16 NOTE — Progress Notes (Signed)
MRI brain and thoracic spine completed. Imaging reveals resolution of the enhancing brain lesions and nearly complete resolution of the thoracic spinal cord lesional enhancement.   A/R: MS exacerbation in a patient with previously undiagnosed MS. MRI reveals lesions separated in space and time.  -- Will need outpatient Neurology follow up with Dr. Epimenio Foot of [facility] for his MS. Will need to be started on disease modifying therapy.  -- Neurohospitalist service will sign off. Please call if there are additional questions.   Electronically signed: Dr. Caryl Pina

## 2019-12-16 NOTE — Progress Notes (Addendum)
NEUROLOGY PROGRESS NOTE  Subjective: Patient awake, alert, NAD. Sitting in chair eating breakfast.  Exam: Vitals:   12/16/19 0815 12/16/19 0818  BP: 122/69 118/76  Pulse: (!) 49 76  Resp: 18 20  Temp: 98.5 F (36.9 C) 98 F (36.7 C)  SpO2: 98% 97%     Physical Exam  Constitutional: Appears well-developed and well-nourished.  Psych: Affect appropriate to situation Eyes: No scleral injection HENT: No OP obstrucion Head: Normocephalic.  Cardiovascular: Normal rate and regular rhythm.  Respiratory: Effort normal, non-labored breathing GI: Soft.  No distension. There is no tenderness.  Skin: WDI  Neuro:  Mental Status: Alert, oriented  Speech fluent without evidence of aphasia.  Able to follow  commands without difficulty. Cranial Nerves: II:  Visual fields grossly normal. PERRL III,IV, VI: ptosis not present, EOMI V,VII: smile symmetric, facial light touch sensation normal bilaterally VIII: hearing normal bilaterally IX,X: Palate rises symmetrically XI: bilateral shoulder shrug XII: midline tongue extension Motor: Right : Upper extremity   5/5  Left:     Upper extremity   5/5  Lower extremity   5/5   Lower extremity   5/5 Tone and bulk:normal tone throughout; no atrophy noted Sensory: light touch intact throughout, bilaterally Plantars: Right: downgoing   Left: downgoing Cerebellar: Did not appreciate any dysmetria on exam this morning. Patient does have some small bilateral tremors.    Medications:  Scheduled: . enoxaparin (LOVENOX) injection  40 mg Subcutaneous Q24H  . pantoprazole  40 mg Oral Daily    Pertinent Labs/Diagnostics:  Anti-Jo 1 antibody, IgG negative Extractable nuclear antigen antibody negative CSF cytology no malignant cells RPR nonreactive NMO antibody negative Serum copper 58 CSF IgG WNL B12 678 Thiamine pending  Jaime Lucks, MSN, NP-C [facility] [phone]  Assessment:  24 year old highly functioning autistic  male with a history of depression presents with 3 to 4 months of multiple neurological symptoms which include difficulty walking, incoordination and slurred speech. MRI scans revealed findings most consistent with a fulminant acute MS exacerbation. He has no prior diagnosis of demyelinating disease.  1. Neuroimaging of the whole neuraxis is most consistent with active demyelinating disease. The most likely etiology based on the imaging appearance is MS. NMO is unlikely due to the profusion of intracranial demyelinating lesions; NMO antibody has come back negative. Other differentials would include a highly atypical imaging presentation of neurosarcoid and atypical CNS lupus or CNS lymphomatous process - of note, CSF cytology showed no malignant cells. Anti-Jo1 Ab and extractable nuclear antigen antibody were negative.   2. Initial CSF labs with normal protein, glucose and WBC. CSF IgG WNL.  3. Patient thus far has received 4 doses out of 5 of IV Solu-Medrol 1000 mg. Last dose will be today.   Recommendations: -Awaiting additional CSF labs -Last dose of IV Solumedrol today.  -Will need follow-up with an MS specialist outpatient-Dr. Despina Arias at [facility] -PT/OT/speech --Repeat MRI brain and thoracic spine with and without contrast to assess for resolution of the previously seen contrast enhancing lesions.   Electronically signed: Dr. Caryl [name]

## 2019-12-16 NOTE — Progress Notes (Addendum)
Pt's MEWS score turned red after an ambulating HR was filed. Pt's HR returned to baseline (60-80bpm) with rest, but a complete set of vital signs was unable to be filed as pt was in MRI.  This was communicated with the hospitalist.  Vital Signs MEWS/VS Documentation      12/16/2019 0818 12/16/2019 1255 12/16/2019 1617 12/16/2019 1645   MEWS Score:  0  1  4  1    MEWS Score Color:  Green  Green  Red  Green   Resp:  20  20  20   --   Pulse:  76  87  (!) 155  64   BP:  118/76  (!) 94/53  (!) 100/55  --   Temp:  98 F (36.7 C)  98 F (36.7 C)  98.9 F (37.2 C)  --   O2 Device:  Room  --           12/16/2019,7:34 PM

## 2019-12-16 NOTE — Progress Notes (Addendum)
PROGRESS NOTE    Jaime [name]  QMV:[id number] DOB: November 17, 1995 DOA: 12/11/2019 PCP: Patient, No Pcp Per   Brief Narrative:  The patient is a 24 year old with a past medical history significant for autism currently not on any medication who started experience weakness and difficulty ambulating over the last 2 months.  This is progressively worsened and he had 3 falls while walking his dog and he did hit his head in the process did not lose consciousness.  Mother had stated that he had been stumbling while walking and started to walk like a toddler and over the last few weeks patient had blurred vision.  He was brought to the emergency room for further evaluation and per the patient's mother he had a prolonged respiratory tract infection 2 months ago in January during which she did have a persistent productive cough.  He had an MRI of the C-spine, T-spine, and L-spine ordered and showed diffuse demyelinating lesions.  Neurology was consulted and recommended the patient be started on Solu-Medrol 1 g daily for 5 days.  Lumbar puncture was done as well and patient was transferred to [facility] for further evaluation.  Currently he is being evaluated and treated for demyelinating disease of central nervous system with concern for multiple sclerosis versus other CNS lymphomatous process.  Patient is undergoing IV Solu-Medrol for 5 days and the plan is for him to go to CIR after completion of his steroids if Neurology has no further recommendations.  Today was his last day of steroids and neurology want to repeat his MRI of the brain and thoracic spine before providing further recommendations.  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  -Other differentials includes  neuromyelitis optica neurosarcoid and CNS lymphomatous's versus other CNS lymphomatous process.  -Patient has been started on IV Solu-Medrol 1 g daily for 5 days; Solu-Medrol dosing now complete on 12/16/2018 -Follow-up CSF labs and showed a IgG serum of 1343, IgG CSF of 7.2, CSF IgG index of 1.1, and Ig G/albumin ratio of CSF of 0.33, nonreactive RPR, negative and nonreactive HIV antibodies, RBC count of 58 in the CSF, WBC of 3, with CSF culture showed No Organisms and NGTD at 3 Days and Gram stain showing few WBCs predominantly mononuclear -Cytology of the CSF showed no malignant cells -Checked ACElevels and was 17 -Anti-Jo 1 antibody was less than 0.2, double-stranded DNA antibody was 9, ENA SM antibody serum-aCnc, ribonucleic protein was less than 0.2, SSA Ro and SSB La antibody IgG was less than 0.2 and scleroderma SCL 70 ENA antibody was less than 0.2 -Neurology consulted for further evaluation and recommendations and appreciate further assistance in management -Obtain PT and OT to evaluate and treat they are recommending CIR and we have consulted the rehab admissions coordinator as well as the rehab MD; patient appears to be a good candidate for CIR once he has completed his -Neurology recommends possibly extending Solu-Medrol for 2 more days after the 5-day dosing based on clinical response but will defer to them as he has completed 5 Days already.  I spoke with Dr. Cheral Marker and he recommends repeating the MRI of the brain and MRI of the thoracic spine with contrast -Considering the response to steroids and MRI results neurology is considering maybe IVIG or even plasma exchange but  they will assess and make recommendations. -They also recommend considering plasma exchange if clinically appropriate -He will need to follow-up with Dr. Despina Arias with [facility] at discharge -Per neurology he may be a candidate for Tysabri or Ocrevus in outpatient setting -Checking a urinalysis and chest x-ray to make  sure there is no evidence of infection as he is currently now on steroids and urinalysis was unremarkable and chest x-ray showed no active disease -We will add Protonix PPI for GI prophylaxis due to high dose steroids to prevent ulceration  -Appreciate further neurological assistance and evaluation recommendations -Anticipating D/C to CIR soon pending Neuro Clearance   History of Autism  -Presently not on any medication. -Is High Functioning   Hypokalemia, improved  -His potassium level today is 4.0 -Continue to monitor and replete as necessary  -Repeat CMP in a.m.  Leukocytosis -Likely in the setting of steroid demargination as WBC went from 10-> 12.7 -> 25.9 -> 25.3 -> 23.9 -> 19.9 -Continue monitor and trend and Monitor for S/Sx of Infection  -Repeat CBC in a.m.   Hyperglycemia -In the setting of steroid demargination -Patient's blood sugar on admission was normal at 89 on his BMP and repeat CMP this AM showed a blood glucose of 112 -Continue to monitor and trend blood sugars carefully and if necessary will place on symptoms NovoLog sliding scale insulin AC -CBG was 146 on random check  -Checked HbA1c and was 5.3   Hyperphosphatemia -Mild as patient's Phos Level went from 3.7 -> 4.9 -> 3.1 -> 4.4 -Continue to Monitor and Trend  -Repeat Phos Level in AM   Sinus Bradycardia -Transient and patient is asymptomatic as he is sleeping -Heart rates improved when he is awakened  DVT prophylaxis: SCDs; Will start Lovenox 40 mg sq Code Status: FULL CODE  Family Communication: No family present at bedside today Disposition Plan: Patient is from home and now requiring an increased level of care and PT OT recommending CIR.  Has completed IV Solu-Medrol and will need Neurology clearance prior to D/C to SNF.  Neurology is now repeating his MRI of the brain and MRI of the thoracic spine with contrast.  Consultants:   Neurology   Procedures: None  Antimicrobials:  Anti-infectives  (From admission, onward)   None     Subjective: Seen and examined at bedside he is extremely sleepy and woken from sleep.  No nausea or vomiting.  States that he is feeling okay.  No other concerns or complaints at this time.  Objective: Vitals:   12/16/19 0015 12/16/19 0518 12/16/19 0815 12/16/19 0818  BP: 116/75 118/66 122/69 118/76  Pulse: 60 (!) 48 (!) 49 76  Resp: 18 16 18 20   Temp: 98.4 F (36.9 C) 98.3 F (36.8 C) 98.5 F (36.9 C) 98 F (36.7 C)  TempSrc: Oral     SpO2: 97% 98% 98% 97%  Weight:      Height:       No intake or output data in the 24 hours ending 12/16/19 0903 Filed Weights   12/11/19 1607  Weight: 54.4 kg   Examination: Physical Exam:  Constitutional: Thin male in no acute distress appears very drowsy and sleepy awoken from sleep but appears calm and comfortable Eyes: Lids and conjunctivae normal, sclerae anicteric  ENMT: External Ears, Nose appear normal. Grossly normal hearing Neck: Appears normal, supple, no cervical masses, normal ROM, no appreciable thyromegaly; no JVD Respiratory: Clear to auscultation bilaterally, no wheezing, rales, rhonchi or crackles. Normal respiratory effort and  patient is not tachypenic. No accessory muscle use.  Cardiovascular: Slightly bradycardic initially but then he had a normal rate and rhythm, no murmurs / rubs / gallops. S1 and S2 auscultated. No extremity edema.  Abdomen: Soft, non-tender, non-distended. Bowel sounds positive.  GU: Deferred. Musculoskeletal: No clubbing / cyanosis of digits/nails. No joint deformity upper and lower extremities.  Skin: No rashes, lesions, ulcers on limited skin evaluation. No induration; Warm and dry.  Neurologic: CN 2-12 grossly intact with no focal deficits. Romberg sign cerebellar reflexes not assessed.  Psychiatric: Normal judgment and insight.  He is somnolent and drowsy but then easily awoken and then become alert and oriented x 3. Normal mood and appropriate affect.   Data  Reviewed: I have personally reviewed following labs and imaging studies  CBC: Recent Labs  Lab 12/11/19 2110 12/12/19 0605 12/13/19 0944 12/14/19 0619 12/15/19 0939  WBC 10.1 12.7* 25.9* 25.3* 23.9*  NEUTROABS 7.5 11.7* 24.3* 22.7* 21.5*  HGB 14.9 15.5 15.4 14.1 14.1  HCT 43.3 44.3 44.8 40.8 40.7  MCV 91.4 90.2 91.4 91.1 91.9  PLT 207 219 249 239 230   Basic Metabolic Panel: Recent Labs  Lab 12/12/19 0605 12/13/19 0944 12/14/19 0619 12/15/19 0939 12/16/19 0540  NA 138 140 141 140 138  K 3.4* 4.1 4.0 3.7 4.0  CL 108 105 103 104 106  CO2 23 23 25 26 27   GLUCOSE 150* 145* 120* 122* 112*  BUN 18 16 12 13 14   CREATININE 0.90 1.08 0.85 0.88 0.87  CALCIUM 9.3 9.5 9.6 9.1 8.8*  MG  --  1.8 1.8 2.0 2.1  PHOS  --  3.7 4.9* 3.1 4.4   GFR: Estimated Creatinine Clearance: 101.6 mL/min (by C-G formula based on SCr of 0.87 mg/dL). Liver Function Tests: Recent Labs  Lab 12/12/19 0605 12/13/19 0944 12/14/19 0619 12/15/19 0939 12/16/19 0540  AST 17 21 16 17 19   ALT 16 15 13 13 14   ALKPHOS 41 44 38 39 34*  BILITOT 1.0 1.1 0.9 0.6 0.5  PROT 7.4 7.1 6.2* 6.5 5.8*  ALBUMIN 4.4 4.3 3.7 3.8 3.3*   No results for input(s): LIPASE, AMYLASE in the last 168 hours. No results for input(s): AMMONIA in the last 168 hours. Coagulation Profile: No results for input(s): INR, PROTIME in the last 168 hours. Cardiac Enzymes: No results for input(s): CKTOTAL, CKMB, CKMBINDEX, TROPONINI in the last 168 hours. BNP (last 3 results) No results for input(s): PROBNP in the last 8760 hours. HbA1C: Recent Labs    12/14/19 0619  HGBA1C 5.3   CBG: Recent Labs  Lab 12/12/19 2131  GLUCAP 146*   Lipid Profile: No results for input(s): CHOL, HDL, LDLCALC, TRIG, CHOLHDL, LDLDIRECT in the last 72 hours. Thyroid Function Tests: No results for input(s): TSH, T4TOTAL, FREET4, T3FREE, THYROIDAB in the last 72 hours. Anemia Panel: No results for input(s): VITAMINB12, FOLATE, FERRITIN, TIBC, IRON,  RETICCTPCT in the last 72 hours. Sepsis Labs: No results for input(s): PROCALCITON, LATICACIDVEN in the last 168 hours.  Recent Results (from the past 240 hour(s))  SARS CORONAVIRUS 2 (TAT 6-24 HRS) Nasopharyngeal Nasopharyngeal Swab     Status: None   Collection Time: 12/12/19 12:02 AM   Specimen: Nasopharyngeal Swab  Result Value Ref Range Status   SARS Coronavirus 2 NEGATIVE NEGATIVE Final    Comment: (NOTE) SARS-CoV-2 target nucleic acids are NOT DETECTED. The SARS-CoV-2 RNA is generally detectable in upper and lower respiratory specimens during the acute phase of infection. Negative results do not  preclude SARS-CoV-2 infection, do not rule out co-infections with other pathogens, and should not be used as the sole basis for treatment or other patient management decisions. Negative results must be combined with clinical observations, patient history, and epidemiological information. The expected result is Negative. Fact Sheet for Patients: HairSlick.no Fact Sheet for Healthcare Providers: quierodirigir.com This test is not yet approved or cleared by the [place] FDA and  has been authorized for detection and/or diagnosis of SARS-CoV-2 by FDA under an Emergency Use Authorization (EUA). This EUA will remain  in effect (meaning this test can be used) for the duration of the COVID-19 declaration under Section 56 4(b)(1) of the Act, 21 U.S.C. section 360bbb-3(b)(1), unless the authorization is terminated or revoked sooner. Performed at [facility], [address], [place], [place] [address]   CSF culture     Status: None   Collection Time: 12/12/19 12:02 AM   Specimen: CSF; Cerebrospinal Fluid  Result Value Ref Range Status   Specimen Description CSF  Final   Special Requests   Final    NONE Performed at [facility], [address], [place], [place] [address]    Gram Stain   Final    FEW WBC  PRESENT, PREDOMINANTLY MONONUCLEAR NO ORGANISMS SEEN RESULT CALLED TO, READ BACK BY AND VERIFIED WITH: RN KATIE AT 0132 12/12/19 CRUICKSHANK A Performed at [facility], [address], [place], [place] [address]    Culture   Final    NO GROWTH 3 DAYS Performed at [facility], [address], [place], [place] [address]    Report Status 12/15/2019 FINAL  Final  MRSA PCR Screening     Status: None   Collection Time: 12/12/19  9:52 AM   Specimen: Nasal Mucosa; Nasopharyngeal  Result Value Ref Range Status   MRSA by PCR NEGATIVE NEGATIVE Final    Comment:        The GeneXpert MRSA Assay (FDA approved for NASAL specimens only), is one component of a comprehensive MRSA colonization surveillance program. It is not intended to diagnose MRSA infection nor to guide or monitor treatment for MRSA infections. Performed at [facility], [address], [place], [place] [address]     RN Pressure Injury Documentation:     Estimated body mass index is 15.83 kg/m as calculated from the following:   Height as of this encounter: 6\' 1"  (1.854 m).   Weight as of this encounter: 54.4 kg.  Malnutrition Type:      Malnutrition Characteristics:      Nutrition Interventions:   Radiology Studies: No results found. Scheduled Meds: . enoxaparin (LOVENOX) injection  40 mg Subcutaneous Q24H  . pantoprazole  40 mg Oral Daily   Continuous Infusions:   LOS: 4 days   , DO [facility] PAGER is on AMION  If 7PM-7AM, please contact night-coverage [email or web address]

## 2019-12-17 ENCOUNTER — Encounter (HOSPITAL_COMMUNITY): Payer: Self-pay | Admitting: Physical Medicine and Rehabilitation

## 2019-12-17 ENCOUNTER — Inpatient Hospital Stay (HOSPITAL_COMMUNITY)
Admission: RE | Admit: 2019-12-17 | Discharge: 2019-12-26 | DRG: 059 | Disposition: A | Discharge status: Home / Self Care | Payer: Medicaid Other | Source: Intra-hospital | Attending: Physical Medicine and Rehabilitation | Admitting: Physical Medicine and Rehabilitation

## 2019-12-17 DIAGNOSIS — I313 Pericardial effusion (noninflammatory): Secondary | ICD-10-CM | POA: Diagnosis not present

## 2019-12-17 DIAGNOSIS — R296 Repeated falls: Secondary | ICD-10-CM | POA: Diagnosis present

## 2019-12-17 DIAGNOSIS — Z832 Family history of diseases of the blood and blood-forming organs and certain disorders involving the immune mechanism: Secondary | ICD-10-CM

## 2019-12-17 DIAGNOSIS — G35 Multiple sclerosis: Secondary | ICD-10-CM | POA: Diagnosis present

## 2019-12-17 DIAGNOSIS — M7989 Other specified soft tissue disorders: Secondary | ICD-10-CM | POA: Diagnosis not present

## 2019-12-17 DIAGNOSIS — R000 Tachycardia, unspecified: Secondary | ICD-10-CM

## 2019-12-17 DIAGNOSIS — F329 Major depressive disorder, single episode, unspecified: Secondary | ICD-10-CM | POA: Diagnosis present

## 2019-12-17 DIAGNOSIS — F840 Autistic disorder: Secondary | ICD-10-CM

## 2019-12-17 DIAGNOSIS — Z23 Encounter for immunization: Secondary | ICD-10-CM | POA: Diagnosis not present

## 2019-12-17 DIAGNOSIS — I951 Orthostatic hypotension: Secondary | ICD-10-CM | POA: Diagnosis present

## 2019-12-17 DIAGNOSIS — R29898 Other symptoms and signs involving the musculoskeletal system: Secondary | ICD-10-CM | POA: Diagnosis present

## 2019-12-17 DIAGNOSIS — K5900 Constipation, unspecified: Secondary | ICD-10-CM | POA: Diagnosis present

## 2019-12-17 DIAGNOSIS — H538 Other visual disturbances: Secondary | ICD-10-CM | POA: Diagnosis present

## 2019-12-17 DIAGNOSIS — D72829 Elevated white blood cell count, unspecified: Secondary | ICD-10-CM | POA: Diagnosis present

## 2019-12-17 DIAGNOSIS — G903 Multi-system degeneration of the autonomic nervous system: Secondary | ICD-10-CM

## 2019-12-17 DIAGNOSIS — I498 Other specified cardiac arrhythmias: Secondary | ICD-10-CM | POA: Diagnosis present

## 2019-12-17 LAB — CBC WITH DIFFERENTIAL/PLATELET
Abs Immature Granulocytes: 0.27 10*3/uL — ABNORMAL HIGH (ref 0.00–0.07)
Basophils Absolute: 0 10*3/uL (ref 0.0–0.1)
Basophils Relative: 0 %
Eosinophils Absolute: 0 10*3/uL (ref 0.0–0.5)
Eosinophils Relative: 0 %
HCT: 40.8 % (ref 39.0–52.0)
Hemoglobin: 14.2 g/dL (ref 13.0–17.0)
Immature Granulocytes: 2 %
Lymphocytes Relative: 11 %
Lymphs Abs: 1.7 10*3/uL (ref 0.7–4.0)
MCH: 31.6 pg (ref 26.0–34.0)
MCHC: 34.8 g/dL (ref 30.0–36.0)
MCV: 90.7 fL (ref 80.0–100.0)
Monocytes Absolute: 1.1 10*3/uL — ABNORMAL HIGH (ref 0.1–1.0)
Monocytes Relative: 8 %
Neutro Abs: 11.6 10*3/uL — ABNORMAL HIGH (ref 1.7–7.7)
Neutrophils Relative %: 79 %
Platelets: 211 10*3/uL (ref 150–400)
RBC: 4.5 MIL/uL (ref 4.22–5.81)
RDW: 13.3 % (ref 11.5–15.5)
WBC: 14.6 10*3/uL — ABNORMAL HIGH (ref 4.0–10.5)
nRBC: 0 % (ref 0.0–0.2)

## 2019-12-17 LAB — COMPREHENSIVE METABOLIC PANEL
ALT: 15 U/L (ref 0–44)
AST: 18 U/L (ref 15–41)
Albumin: 3.3 g/dL — ABNORMAL LOW (ref 3.5–5.0)
Alkaline Phosphatase: 37 U/L — ABNORMAL LOW (ref 38–126)
Anion gap: 12 (ref 5–15)
BUN: 15 mg/dL (ref 6–20)
CO2: 27 mmol/L (ref 22–32)
Calcium: 8.7 mg/dL — ABNORMAL LOW (ref 8.9–10.3)
Chloride: 100 mmol/L (ref 98–111)
Creatinine, Ser: 1 mg/dL (ref 0.61–1.24)
GFR calc Af Amer: >60 mL/min (ref >60)
GFR calc non Af Amer: >60 mL/min (ref >60)
Glucose, Bld: 94 mg/dL (ref 70–99)
Potassium: 3.9 mmol/L (ref 3.5–5.1)
Sodium: 139 mmol/L (ref 135–145)
Total Bilirubin: 0.4 mg/dL (ref 0.3–1.2)
Total Protein: 5.9 g/dL — ABNORMAL LOW (ref 6.5–8.1)

## 2019-12-17 LAB — PHOSPHORUS: Phosphorus: 4.2 mg/dL (ref 2.5–4.6)

## 2019-12-17 LAB — VITAMIN B1: Vitamin B1 (Thiamine): 166.3 nmol/L (ref 66.5–200.0)

## 2019-12-17 LAB — MAGNESIUM: Magnesium: 2.2 mg/dL (ref 1.7–2.4)

## 2019-12-17 MED ORDER — INFLUENZA VAC SPLIT QUAD 0.5 ML IM SUSY
0.5000 mL | PREFILLED_SYRINGE | INTRAMUSCULAR | Status: AC
  Administered 2019-12-19: 0.5 mL via INTRAMUSCULAR
  Filled 2019-12-17: qty 0.5

## 2019-12-17 MED ORDER — ONDANSETRON HCL 4 MG/2ML IJ SOLN: 4.0000 mg | Freq: Four times a day (QID) | INTRAMUSCULAR | Status: DC | PRN

## 2019-12-17 MED ORDER — ACETAMINOPHEN 325 MG PO TABS: 650.0000 mg | ORAL_TABLET | Freq: Four times a day (QID) | ORAL | Status: DC | PRN

## 2019-12-17 MED ORDER — ONDANSETRON HCL 4 MG PO TABS: 4.0000 mg | ORAL_TABLET | Freq: Four times a day (QID) | ORAL | Status: DC | PRN

## 2019-12-17 MED ORDER — ENOXAPARIN SODIUM 40 MG/0.4ML ~~LOC~~ SOLN
40.0000 mg | SUBCUTANEOUS | Status: DC
  Administered 2019-12-18 – 2019-12-26 (×9): 40 mg via SUBCUTANEOUS
  Filled 2019-12-17 (×9): qty 0.4

## 2019-12-17 MED ORDER — ENOXAPARIN SODIUM 40 MG/0.4ML ~~LOC~~ SOLN: 40.0000 mg | SUBCUTANEOUS | Status: DC

## 2019-12-17 MED ORDER — SORBITOL 70 % SOLN
30.0000 mL | Freq: Every day | Status: DC | PRN
  Administered 2019-12-19: 30 mL via ORAL
  Filled 2019-12-17: qty 30

## 2019-12-17 MED ORDER — ACETAMINOPHEN 650 MG RE SUPP: 650.0000 mg | Freq: Four times a day (QID) | RECTAL | Status: DC | PRN

## 2019-12-17 MED ORDER — PANTOPRAZOLE SODIUM 40 MG PO TBEC
40.0000 mg | DELAYED_RELEASE_TABLET | Freq: Every day | ORAL | Status: DC
  Administered 2019-12-18 – 2019-12-26 (×9): 40 mg via ORAL
  Filled 2019-12-17 (×9): qty 1

## 2019-12-17 NOTE — Progress Notes (Signed)
Physical Therapy Treatment Patient Details Name: Jaime [name] MRN: [id number] DOB: 06-Apr-1996 Today's Date: 12/17/2019    History of Present Illness 24 yo male presenting with increasing weakness and difficulty with ambulation over last three months. Several falls recently. MRI of the C-spine, T-spine, and L-spine ordered and showed diffuse demyelinating lesions concerning for MS. PMH including autism.     PT Comments    Patient progressing with ambulation distance and able to demonstrate improved pattern needing min A with walker and with HHA.  He remains flat and with decreased safety awareness as well as with HR up to 167 with ambulation today.  Does c/o some dizziness, but reports it is not real bad.  Still feel he will benefit from CIR level rehab at d/c.    Follow Up Recommendations  CIR     Equipment Recommendations  Other (comment)(TBA)    Recommendations for Other Services       Precautions / Restrictions Precautions Precautions: Fall    Mobility  Bed Mobility Overal bed mobility: Needs Assistance Bed Mobility: Sit to Supine       Sit to supine: Supervision;HOB elevated   General bed mobility comments: in recliner, but requested back to bed end of session  Transfers Overall transfer level: Needs assistance Equipment used: Rolling walker (2 wheeled) Transfers: Sit to/from Stand Sit to Stand: Min guard         General transfer comment: to steady with pt needing UE support for sit to stand  Ambulation/Gait Ambulation/Gait assistance: Min assist Gait Distance (Feet): 120 Feet Assistive device: Rolling walker (2 wheeled);1 person hand held assist Gait Pattern/deviations: Step-to pattern;Step-through pattern;Ataxic;Wide base of support;Decreased stride length     General Gait Details: with RW into hallway with assist to manage and for balance as at times hips pushed back and wide BOS.  Patient needing cues for posture, used HHA to return to room with min  A as well, but some increased ataxia and scissoring at times.  HR max 167   Stairs             Wheelchair Mobility    Modified Rankin (Stroke Patients Only)       Balance             Standing balance-Leahy Scale: Fair Standing balance comment: UE support needed for ambulation, static balance with S for safety             High level balance activites: Side stepping;Other (comment) High Level Balance Comments: standing balance activities alternating step taps forward with one UE support and min A; side stepping at bedside x 3 reps length of the bed with cues and minguard A.            Cognition Arousal/Alertness: Awake/alert Behavior During Therapy: WFL for tasks assessed/performed Overall Cognitive Status: Within Functional Limits for tasks assessed                                 General Comments: Autism at baseline. Stiff but appropriate. Increased time for processing.      Exercises      General Comments        Pertinent Vitals/Pain Pain Assessment: No/denies pain    Home Living                      Prior Function            PT Goals (  current goals can now be found in the care plan section) Progress towards PT goals: Progressing toward goals    Frequency    Min 3X/week      PT Plan Current plan remains appropriate    Co-evaluation              AM-PAC PT "6 Clicks" Mobility   Outcome Measure  Help needed turning from your back to your side while in a flat bed without using bedrails?: None Help needed moving from lying on your back to sitting on the side of a flat bed without using bedrails?: A Little Help needed moving to and from a bed to a chair (including a wheelchair)?: A Little Help needed standing up from a chair using your arms (e.g., wheelchair or bedside chair)?: A Little Help needed to walk in hospital room?: A Little Help needed climbing 3-5 steps with a railing? : A Lot 6 Click Score:  18    End of Session   Activity Tolerance: Patient limited by fatigue Patient left: in bed;with call bell/phone within reach;with bed alarm set   PT Visit Diagnosis: Other abnormalities of gait and mobility (R26.89);Other symptoms and signs involving the nervous system (R29.898)     Time: 1610-9604 PT Time Calculation (min) (ACUTE ONLY): 20 min  Charges:  $Gait Training: 8-22 mins                     [name], [facility] [phone] 12/17/2019    Jaime [name] 12/17/2019, 12:03 PM

## 2019-12-17 NOTE — Progress Notes (Signed)
PMR Admission Coordinator Pre-Admission Assessment   Patient: Jaime [name] is an 24 y.o., male MRN: [id number] DOB: 10-29-1995 Height: 6\' 1"  (185.4 cm) Weight: 54.4 kg                                                                                                                                                  Insurance Information HMO:     PPO:      PCP:      IPA:      80/20:      OTHER:  PRIMARY: Pt has lapsed Medicaid policy, pending reinstatement.  Policy # M.  Financial counselor is [id number].    Medicaid Application Date:       Case Manager:  Disability Application Date:       Case Worker:    The "Data Collection Information Summary" for patients in Inpatient Rehabilitation Facilities with attached "Privacy Act Statement-Health Care Records" was provided and verbally reviewed with: N/A   Emergency Contact Information Contact Information     Name Relation Home Work Mobile    [place] Mother     [phone]    [place]ar[place], Diana     Alease Medina       Current Medical History  Patient Admitting Diagnosis: new MS    History of Present Illness: Jaime [name] is a 24 year old right-handed male history of depression, well-documented high functioning autism.  Presented 12/12/2019 with weakness, blurred vision and falls x3 months.  On 1 occasion he did strike his head without loss of consciousness.  Denies any bowel or bladder disturbances.  MRI showed patchy signal abnormality within the visualized distal cord consistent with demyelinating disease.  No abnormal enhancement to suggest active demyelination.  Tiny central disc protrusion at L5-S1 without stenosis or impingement.  Admission chemistries unremarkable except potassium 3.4, SARS coronavirus negative.  LP completed CSF no growth glucose 57 protein 33.  Neurology follow-up suspect multiple sclerosis placed on IV Solu-Medrol x5 days with plan to follow-up Dr. 02/11/2020 as an outpatient.  Lovenox for DVT  prophylaxis.  Tolerating a regular consistency diet.  Therapy evaluations completed and patient was recommended for a comprehensive rehab program.   Past Medical History      Past Medical History:  Diagnosis Date  . Autism    . Depression        Family History  family history includes Clotting disorder in his mother.   Prior Rehab/Hospitalizations:  Has the patient had prior rehab or hospitalizations prior to admission? No   Has the patient had major surgery during 100 days prior to admission? No   Current Medications    Current Facility-Administered Medications:  .  acetaminophen (TYLENOL) tablet 650 mg, 650 mg, Oral, Q6H PRN **OR** acetaminophen (TYLENOL) suppository 650 mg, 650 mg, Rectal, Q6H PRN, Epimenio Foot, MD .  enoxaparin (LOVENOX) injection  40 mg, 40 mg, Subcutaneous, Q24H, Marguerita Merles [place], [place], 40 mg at 12/17/19 5397 .  ondansetron (ZOFRAN) tablet 4 mg, 4 mg, Oral, Q6H PRN **OR** ondansetron (ZOFRAN) injection 4 mg, 4 mg, Intravenous, Q6H PRN, Eduard Clos, MD .  pantoprazole (PROTONIX) EC tablet 40 mg, 40 mg, Oral, Daily, Eduard Clos, MD, 40 mg at 12/17/19 6734   Patients Current Diet:     Diet Order                      Diet regular Room service appropriate? Yes with Assist; Fluid consistency: Thin  Diet effective now                   Precautions / Restrictions Precautions Precautions: Fall Restrictions Weight Bearing Restrictions: No    Has the patient had 2 or more falls or a fall with injury in the past year?Yes   Prior Activity Level  independent without AD.  Community ambulatory, not driving, walking dog every day, volunteering at [facility] [facility]   Prior Functional Level Prior Function Level of Independence: Independent Comments: Enjoys playing guitar, splatter paint, and was volunteering at [employer]   Self Care: Did the patient need help bathing, dressing, using the toilet or eating?  Independent   Indoor Mobility: Did the  patient need assistance with walking from room to room (with or without device)? Independent   Stairs: Did the patient need assistance with internal or external stairs (with or without device)? Independent   Functional Cognition: Did the patient need help planning regular tasks such as shopping or remembering to take medications? Independent   Home Assistive Devices / Equipment Home Equipment: None   Prior Device Use: Indicate devices/aids used by the patient prior to current illness, exacerbation or injury? None of the above   Current Functional Level Cognition   Overall Cognitive Status: Within Functional Limits for tasks assessed Orientation Level: Oriented X4 General Comments: Autism at baseline. Stiff but appropriate. Increased time for processing.    Extremity Assessment (includes Sensation/Coordination)   Upper Extremity Assessment: Generalized weakness  Lower Extremity Assessment: Defer to PT evaluation RLE Deficits / Details: able to move through full active range, trembling/muscle fatigue noted RLE Sensation: decreased proprioception RLE Coordination: decreased gross motor LLE Deficits / Details: able to move through full active range, trembling/muscle fatigue noted LLE Sensation: decreased proprioception LLE Coordination: decreased gross motor     ADLs   Overall ADL's : Needs assistance/impaired Eating/Feeding: Set up, Sitting Grooming: Set up, Supervision/safety, Sitting Grooming Details (indicate cue type and reason): brielfy able to stand at sink with single extremity support. Unsteadiness noted.  Upper Body Bathing: Min guard, Sitting Lower Body Bathing: Moderate assistance, Sit to/from stand Upper Body Dressing : Min guard, Sitting Upper Body Dressing Details (indicate cue type and reason): Donning second gown like a jacket Lower Body Dressing: Moderate assistance, Sit to/from stand Lower Body Dressing Details (indicate cue type and reason): Noting decreased  balance when bending forward to adjust sock Toilet Transfer: Minimal assistance, Ambulation, RW(3n1 over toilet. simulated.) Functional mobility during ADLs: Minimal assistance, +2 for safety/equipment, Rolling walker General ADL Comments: Min A for steadying due to weakness and impaired coordination. Utilized rw this session to ambulate in the room navigating turns and obstacles.     Mobility   Overal bed mobility: Needs Assistance Bed Mobility: Sit to Supine Supine to sit: Supervision, HOB elevated Sit to supine: Supervision, HOB elevated General bed mobility comments: in recliner, but requested  back to bed end of session     Transfers   Overall transfer level: Needs assistance Equipment used: Rolling walker (2 wheeled) Transfers: Sit to/from Stand Sit to Stand: Min guard General transfer comment: to steady with pt needing UE support for sit to stand     Ambulation / Gait / Stairs / Wheelchair Mobility   Ambulation/Gait Ambulation/Gait assistance: Herbalist (Feet): 120 Feet Assistive device: Rolling walker (2 wheeled), 1 person hand held assist Gait Pattern/deviations: Step-to pattern, Step-through pattern, Ataxic, Wide base of support, Decreased stride length General Gait Details: with RW into hallway with assist to manage and for balance as at times hips pushed back and wide BOS.  Patient needing cues for posture, used HHA to return to room with min A as well, but some increased ataxia and scissoring at times.  HR max 167 Gait velocity: decreased, guarded gait Gait velocity interpretation: <1.8 ft/sec, indicate of risk for recurrent falls     Posture / Balance Balance Overall balance assessment: Needs assistance Sitting-balance support: No upper extremity supported, Feet supported Sitting balance-Leahy Scale: Fair Standing balance support: During functional activity, Single extremity supported, Bilateral upper extremity supported Standing balance-Leahy Scale:  Fair Standing balance comment: UE support needed for ambulation, static balance with S for safety High level balance activites: Side stepping, Other (comment) High Level Balance Comments: standing balance activities alternating step taps forward with one UE support and min A; side stepping at bedside x 3 reps length of the bed with cues and minguard A.     Special needs/care consideration BiPAP/CPAP no CPM no Continuous Drip IV no Dialysis no        Days n/a Life Vest no  Oxygen no  Special Bed no Trach Size no Wound Vac (area) no      Location n/a Skin abrasion to L elbow                             Bowel mgmt: continent Bladder mgmt: continent Diabetic mgmt no Behavioral consideration autistic Chemo/radiation n/a        Previous Home Environment (from acute therapy documentation) Living Arrangements: Parent, Other relatives(siblings) Available Help at Discharge: Family Type of Home: Apartment Home Layout: Bed/bath upstairs, Two level Alternate Level Stairs-Rails: Right Alternate Level Stairs-Number of Steps: full flight Home Access: Stairs to enter Entrance Stairs-Rails: None Entrance Stairs-Number of Steps: 1+1 Bathroom Shower/Tub: Chiropodist: Standard Bathroom Accessibility: Yes How Accessible: Accessible via walker Additional Comments: Will need to confirm information with mother as pt with slight difficulty describing details of home   Discharge Living Setting Plans for Discharge Living Setting: Patient's home Type of Home at Discharge: Apartment Discharge Home Layout: Two level, Bed/bath upstairs Alternate Level Stairs-Rails: Right Alternate Level Stairs-Number of Steps: full flight Discharge Home Access: Stairs to enter Entrance Stairs-Rails: None Entrance Stairs-Number of Steps: 1+1 Discharge Bathroom Shower/Tub: Tub/shower unit Discharge Bathroom Toilet: Standard Discharge Bathroom Accessibility: Yes How Accessible: Accessible via  walker Does the patient have any problems obtaining your medications?: No   Social/Family/Support Systems Anticipated Caregiver: [place] Arch  Anticipated Ambulance person Information: [phone] (home phone, does not have cell, okay to leave messages with children) Caregiver Availability: 24/7 Discharge Plan Discussed with Primary Caregiver: Yes Is Caregiver In Agreement with Plan?: Yes Does Caregiver/Family have Issues with Lodging/Transportation while Pt is in Rehab?: No     Goals/Additional Needs Patient/Family Goal for Rehab: PT/OT supervision to mod I  Expected length of stay: 9-12 days Dietary Needs: reg/thin Equipment Needs: tbd Additional Information: pt is autistic Pt/Family Agrees to Admission and willing to participate: Yes Program Orientation Provided & Reviewed with Pt/Caregiver Including Roles  & Responsibilities: Yes     Decrease burden of Care through IP rehab admission: n/a     Possible need for SNF placement upon discharge: Not anticipated. Pt with good caregiver support.      Patient Condition: This patient's medical and functional status has changed since the consult dated: 12/14/2019 in which the Rehabilitation Physician determined and documented that the patient's condition is appropriate for intensive rehabilitative care in an inpatient rehabilitation facility. See "History of Present Illness" (above) for medical update. Functional changes are: min assist with mobility. Patient's medical and functional status update has been discussed with the Rehabilitation physician and patient remains appropriate for inpatient rehabilitation. Will admit to inpatient rehab today.   Preadmission Screen Completed By:  Stephania Fragmin, PT, DPT 12/17/2019 1:26 PM ______________________________________________________________________   Discussed status with Dr. Wynn Banker on 12/17/19 at 1:31 PM  and received approval for admission today.   Admission Coordinator:  Stephania Fragmin, PT, DPT time 1:31 PM Dorna Bloom 12/17/19          Cosigned by: Erick Colace, MD at 12/17/2019  1:47 PM

## 2019-12-17 NOTE — Progress Notes (Signed)
Patient arrived to unit via bed. Patient was A/O x4. Patient was oriented to unit. Designated visitor is Corbett Moulder (mother). Medications have been reviewed along with Fall safety prevention plan. No concerns to report at this time. Leane Para, LPN

## 2019-12-17 NOTE — PMR Pre-admission (Signed)
PMR Admission Coordinator Pre-Admission Assessment  Patient: Jaime [name] is an 24 y.o., male MRN: [id number] DOB: 26-Feb-1996 Height: 6\' 1"  (185.4 cm) Weight: 54.4 kg              Insurance Information HMO:     PPO:      PCP:      IPA:      80/20:      OTHER:  PRIMARY: Pt has lapsed Medicaid policy, pending reinstatement.  Policy # M.  Financial counselor is [id number].   Medicaid Application Date:       Case Manager:  Disability Application Date:       Case Worker:   The "Data Collection Information Summary" for patients in Inpatient Rehabilitation Facilities with attached "Privacy Act Statement-Health Care Records" was provided and verbally reviewed with: N/A  Emergency Contact Information Contact Information    Name Relation Home Work Mobile   [place] Mother   [phone]   Yasuo, Phimmasone   Alease Medina     Current Medical History  Patient Admitting Diagnosis: new MS   History of Present Illness: Jaime [name] is a 24 year old right-handed male history of depression, well-documented high functioning autism.  Presented 12/12/2019 with weakness, blurred vision and falls x3 months.  On 1 occasion he did strike his head without loss of consciousness.  Denies any bowel or bladder disturbances.  MRI showed patchy signal abnormality within the visualized distal cord consistent with demyelinating disease.  No abnormal enhancement to suggest active demyelination.  Tiny central disc protrusion at L5-S1 without stenosis or impingement.  Admission chemistries unremarkable except potassium 3.4, SARS coronavirus negative.  LP completed CSF no growth glucose 57 protein 33.  Neurology follow-up suspect multiple sclerosis placed on IV Solu-Medrol x5 days with plan to follow-up Dr. 02/11/2020 as an outpatient.  Lovenox for DVT prophylaxis.  Tolerating a regular consistency diet.  Therapy evaluations completed and patient was recommended for a comprehensive rehab  program.  Past Medical History  Past Medical History:  Diagnosis Date  . Autism   . Depression     Family History  family history includes Clotting disorder in his mother.  Prior Rehab/Hospitalizations:  Has the patient had prior rehab or hospitalizations prior to admission? No  Has the patient had major surgery during 100 days prior to admission? No  Current Medications   Current Facility-Administered Medications:  .  acetaminophen (TYLENOL) tablet 650 mg, 650 mg, Oral, Q6H PRN **OR** acetaminophen (TYLENOL) suppository 650 mg, 650 mg, Rectal, Q6H PRN, Epimenio Foot, MD .  enoxaparin (LOVENOX) injection 40 mg, 40 mg, Subcutaneous, Q24H, Sheikh, Eduard Clos [place], DO, 40 mg at 12/17/19 12/19/19 .  ondansetron (ZOFRAN) tablet 4 mg, 4 mg, Oral, Q6H PRN **OR** ondansetron (ZOFRAN) injection 4 mg, 4 mg, Intravenous, Q6H PRN, 1771, MD .  pantoprazole (PROTONIX) EC tablet 40 mg, 40 mg, Oral, Daily, Eduard Clos, MD, 40 mg at 12/17/19 12/19/19  Patients Current Diet:  Diet Order            Diet regular Room service appropriate? Yes with Assist; Fluid consistency: Thin  Diet effective now              Precautions / Restrictions Precautions Precautions: Fall Restrictions Weight Bearing Restrictions: No   Has the patient had 2 or more falls or a fall with injury in the past year?Yes  Prior Activity Level  independent without AD.  Community ambulatory, not driving, walking dog every day, volunteering at [facility]  Prior Functional Level Prior Function Level of Independence: Independent Comments: Enjoys playing guitar, splatter paint, and was volunteering at [facility].  Self Care: Did the patient need help bathing, dressing, using the toilet or eating?  Independent  Indoor Mobility: Did the patient need assistance with walking from room to room (with or without device)? Independent  Stairs: Did the patient need assistance with internal or external stairs (with or  without device)? Independent  Functional Cognition: Did the patient need help planning regular tasks such as shopping or remembering to take medications? Independent  Home Assistive Devices / Equipment Home Equipment: None  Prior Device Use: Indicate devices/aids used by the patient prior to current illness, exacerbation or injury? None of the above  Current Functional Level Cognition  Overall Cognitive Status: Within Functional Limits for tasks assessed Orientation Level: Oriented X4 General Comments: Autism at baseline. Stiff but appropriate. Increased time for processing.    Extremity Assessment (includes Sensation/Coordination)  Upper Extremity Assessment: Generalized weakness  Lower Extremity Assessment: Defer to PT evaluation RLE Deficits / Details: able to move through full active range, trembling/muscle fatigue noted RLE Sensation: decreased proprioception RLE Coordination: decreased gross motor LLE Deficits / Details: able to move through full active range, trembling/muscle fatigue noted LLE Sensation: decreased proprioception LLE Coordination: decreased gross motor    ADLs  Overall ADL's : Needs assistance/impaired Eating/Feeding: Set up, Sitting Grooming: Set up, Supervision/safety, Sitting Grooming Details (indicate cue type and reason): brielfy able to stand at sink with single extremity support. Unsteadiness noted.  Upper Body Bathing: Min guard, Sitting Lower Body Bathing: Moderate assistance, Sit to/from stand Upper Body Dressing : Min guard, Sitting Upper Body Dressing Details (indicate cue type and reason): Donning second gown like a jacket Lower Body Dressing: Moderate assistance, Sit to/from stand Lower Body Dressing Details (indicate cue type and reason): Noting decreased balance when bending forward to adjust sock Toilet Transfer: Minimal assistance, Ambulation, RW(3n1 over toilet. simulated.) Functional mobility during ADLs: Minimal assistance, +2 for  safety/equipment, Rolling walker General ADL Comments: Min A for steadying due to weakness and impaired coordination. Utilized rw this session to ambulate in the room navigating turns and obstacles.    Mobility  Overal bed mobility: Needs Assistance Bed Mobility: Sit to Supine Supine to sit: Supervision, HOB elevated Sit to supine: Supervision, HOB elevated General bed mobility comments: in recliner, but requested back to bed end of session    Transfers  Overall transfer level: Needs assistance Equipment used: Rolling walker (2 wheeled) Transfers: Sit to/from Stand Sit to Stand: Min guard General transfer comment: to steady with pt needing UE support for sit to stand    Ambulation / Gait / Stairs / Emergency planning/management officer  Ambulation/Gait Ambulation/Gait assistance: Herbalist (Feet): 120 Feet Assistive device: Rolling walker (2 wheeled), 1 person hand held assist Gait Pattern/deviations: Step-to pattern, Step-through pattern, Ataxic, Wide base of support, Decreased stride length General Gait Details: with RW into hallway with assist to manage and for balance as at times hips pushed back and wide BOS.  Patient needing cues for posture, used HHA to return to room with min A as well, but some increased ataxia and scissoring at times.  HR max 167 Gait velocity: decreased, guarded gait Gait velocity interpretation: <1.8 ft/sec, indicate of risk for recurrent falls    Posture / Balance Balance Overall balance assessment: Needs assistance Sitting-balance support: No upper extremity supported, Feet supported Sitting balance-Leahy Scale: Fair Standing balance support: During functional activity, Single extremity supported, Bilateral upper  extremity supported Standing balance-Leahy Scale: Fair Standing balance comment: UE support needed for ambulation, static balance with S for safety High level balance activites: Side stepping, Other (comment) High Level Balance Comments:  standing balance activities alternating step taps forward with one UE support and min A; side stepping at bedside x 3 reps length of the bed with cues and minguard A.    Special needs/care consideration BiPAP/CPAP no CPM no Continuous Drip IV no Dialysis no        Days n/a Life Vest no  Oxygen no  Special Bed no Trach Size no Wound Vac (area) no      Location n/a Skin abrasion to L elbow                             Bowel mgmt: continent Bladder mgmt: continent Diabetic mgmt no Behavioral consideration autistic Chemo/radiation n/a     Previous Home Environment (from acute therapy documentation) Living Arrangements: Parent, Other relatives(siblings) Available Help at Discharge: Family Type of Home: Apartment Home Layout: Bed/bath upstairs, Two level Alternate Level Stairs-Rails: Right Alternate Level Stairs-Number of Steps: full flight Home Access: Stairs to enter Entrance Stairs-Rails: None Entrance Stairs-Number of Steps: 1+1 Bathroom Shower/Tub: Engineer, manufacturing systems: Standard Bathroom Accessibility: Yes How Accessible: Accessible via walker Additional Comments: Will need to confirm information with mother as pt with slight difficulty describing details of home  Discharge Living Setting Plans for Discharge Living Setting: Patient's home Type of Home at Discharge: Apartment Discharge Home Layout: Two level, Bed/bath upstairs Alternate Level Stairs-Rails: Right Alternate Level Stairs-Number of Steps: full flight Discharge Home Access: Stairs to enter Entrance Stairs-Rails: None Entrance Stairs-Number of Steps: 1+1 Discharge Bathroom Shower/Tub: Tub/shower unit Discharge Bathroom Toilet: Standard Discharge Bathroom Accessibility: Yes How Accessible: Accessible via walker Does the patient have any problems obtaining your medications?: No  Social/Family/Support Systems Anticipated Caregiver: Andra Matsuo  Anticipated Industrial/product designer Information:  [phone] (home phone, does not have cell, okay to leave messages with children) Caregiver Availability: 24/7 Discharge Plan Discussed with Primary Caregiver: Yes Is Caregiver In Agreement with Plan?: Yes Does Caregiver/Family have Issues with Lodging/Transportation while Pt is in Rehab?: No   Goals/Additional Needs Patient/Family Goal for Rehab: PT/OT supervision to mod I Expected length of stay: 9-12 days Dietary Needs: reg/thin Equipment Needs: tbd Additional Information: pt is autistic Pt/Family Agrees to Admission and willing to participate: Yes Program Orientation Provided & Reviewed with Pt/Caregiver Including Roles  & Responsibilities: Yes   Decrease burden of Care through IP rehab admission: n/a   Possible need for SNF placement upon discharge: Not anticipated. Pt with good caregiver support.    Patient Condition: This patient's medical and functional status has changed since the consult dated: 12/14/2019 in which the Rehabilitation Physician determined and documented that the patient's condition is appropriate for intensive rehabilitative care in an inpatient rehabilitation facility. See "History of Present Illness" (above) for medical update. Functional changes are: min assist with mobility. Patient's medical and functional status update has been discussed with the Rehabilitation physician and patient remains appropriate for inpatient rehabilitation. Will admit to inpatient rehab today.  Preadmission Screen Completed By:  Stephania Fragmin, PT, DPT 12/17/2019 1:26 PM ______________________________________________________________________   Discussed status with Dr. Wynn Banker on 12/17/19 at 1:31 PM  and received approval for admission today.  Admission Coordinator:  Stephania Fragmin, PT, DPT time 1:31 PM Dorna Bloom 12/17/19

## 2019-12-17 NOTE — Progress Notes (Signed)
Report given to nurse in in patient rehab. Nurse told pt would be going to 4MW11. Pt transferred to IP rehab via bed. Pt personal belongings Packed in personal bag and transferred with pt. Mayford Knife RN

## 2019-12-17 NOTE — H&P (Signed)
Physical Medicine and Rehabilitation Admission H&P        Chief Complaint  Patient presents with  . Fall  . Head Injury  : HPI: Jaime [name] is a 24 year old right-handed male history of depression well-documented high functioning autism.  Per chart review lives with his mother and 3 younger siblings.  Second floor apartment.  Reportedly independent prior to admission had been volunteering at [employer].  Presented 12/12/2019 with weakness, blurred vision and falls x3 months.  On 1 occasion he did strike his head without loss of consciousness.  Denies any bowel or bladder disturbances.  MRI showed patchy signal abnormality within the visualized distal cord consistent with demyelinating disease.  No abnormal enhancement to suggest active demyelination.  Tiny central disc protrusion at L5-S1 without stenosis or impingement.  Admission chemistries unremarkable except potassium 3.4, SARS coronavirus negative.  LP completed CSF no growth glucose 57 protein 33.  Neurology follow-up suspect multiple sclerosis placed on IV Solu-Medrol x5 days with plan to follow-up Dr. Felecia Shelling as an outpatient.  Lovenox for DVT prophylaxis.  Tolerating a regular consistency diet.  Therapy evaluations completed and patient was admitted for a comprehensive rehab program.   Review of Systems  Constitutional: Negative for chills and fever.  HENT: Negative for hearing loss.   Eyes: Positive for blurred vision. Negative for double vision.  Respiratory: Negative for cough and shortness of breath.   Cardiovascular: Negative for chest pain, palpitations and leg swelling.  Gastrointestinal: Positive for constipation. Negative for heartburn, nausea and vomiting.  Genitourinary: Negative for dysuria, flank pain and hematuria.  Musculoskeletal: Positive for myalgias.       Recent falls over the last 3 months  Skin: Negative for rash.  Neurological: Positive for weakness.  Psychiatric/Behavioral: Positive for depression.        High-level functioning autism  All other systems reviewed and are negative.       Past Medical History:  Diagnosis Date  . Autism    . Depression      History reviewed. No pertinent surgical history.      Family History  Problem Relation Age of Onset  . Clotting disorder Mother      Social History:  reports that he has never smoked. He has never used smokeless tobacco. He reports that he does not drink alcohol or use drugs. Allergies: No Known Allergies No medications prior to admission.      Drug Regimen Review Drug regimen was reviewed and remains appropriate with no significant issues identified   Home: Home Living Family/patient expects to be discharged to:: Private residence Living Arrangements: Parent, Other relatives(mother, 3 younger siblings) Available Help at Discharge: Family Type of Home: Apartment(possibly 2nd floor apt) Home Access: Stairs to enter Entrance Stairs-Number of Steps: flight Entrance Stairs-Rails: Right Home Layout: One level Bathroom Shower/Tub: Chiropodist: Standard Home Equipment: None Additional Comments: Will need to confirm information with mother as pt with slight difficulty describing details of home   Functional History: Prior Function Level of Independence: Independent Comments: Enjoys playing guitar, splatter paint, and was volunteering at [employer].   Functional Status:  Mobility: Bed Mobility Overal bed mobility: Needs Assistance Bed Mobility: Supine to Sit Supine to sit: Supervision, HOB elevated General bed mobility comments: Pt received in recliner. Transfers Overall transfer level: Needs assistance Equipment used: Rolling walker (2 wheeled) Transfers: Sit to/from Stand Sit to Stand: Min assist General transfer comment: assist to power up and stabilize balance. cues for technique with rw. Ambulation/Gait Ambulation/Gait  assistance: Mod assist Gait Distance (Feet): 20 Feet Assistive device: Rolling  walker (2 wheeled) Gait Pattern/deviations: Wide base of support, Ataxic, Drifts right/left General Gait Details: assist to maintain balance, more steady with RW Gait velocity: decreased, guarded gait Gait velocity interpretation: <1.8 ft/sec, indicate of risk for recurrent falls   ADL: ADL Overall ADL's : Needs assistance/impaired Eating/Feeding: Set up, Sitting Grooming: Set up, Supervision/safety, Sitting Grooming Details (indicate cue type and reason): brielfy able to stand at sink with single extremity support. Unsteadiness noted.  Upper Body Bathing: Min guard, Sitting Lower Body Bathing: Moderate assistance, Sit to/from stand Upper Body Dressing : Min guard, Sitting Upper Body Dressing Details (indicate cue type and reason): Donning second gown like a jacket Lower Body Dressing: Moderate assistance, Sit to/from stand Lower Body Dressing Details (indicate cue type and reason): Noting decreased balance when bending forward to adjust sock Toilet Transfer: Minimal assistance, Ambulation, RW(3n1 over toilet. simulated.) Functional mobility during ADLs: Minimal assistance, +2 for safety/equipment, Rolling walker General ADL Comments: Min A for steadying due to weakness and impaired coordination. Utilized rw this session to ambulate in the room navigating turns and obstacles.   Cognition: Cognition Overall Cognitive Status: Within Functional Limits for tasks assessed Orientation Level: Oriented X4 Cognition Arousal/Alertness: Awake/alert Behavior During Therapy: WFL for tasks assessed/performed Overall Cognitive Status: Within Functional Limits for tasks assessed General Comments: Autism at baseline. Stiff but appropriate. Increased time for processing.   Physical Exam: Blood pressure (!) 113/58, pulse (!) 50, temperature 98 F (36.7 C), resp. rate 20, height  (1.854 m), weight 54.4 kg, SpO2 99 %. Physical Exam  Neurological:  Patient is sitting up in bed.  Mood is a bit  flat but appropriate.  Follows commands.  He does answer simple yes/no questions.  Provides his name and age.  Very limited medical historian.    General: No acute distress Mood and affect are appropriate Heart: Regular rate and rhythm no rubs murmurs or extra sounds Lungs: Clear to auscultation, breathing unlabored, no rales or wheezes Abdomen: Positive bowel sounds, soft nontender to palpation, nondistended Extremities: No clubbing, cyanosis, or edema Skin: No evidence of breakdown, no evidence of rash Neurologic: Cranial nerves II through XII intact, motor strength is 4/5 in bilateral deltoid, bicep, tricep, grip, hip flexor, knee extensors, ankle dorsiflexor and plantar flexor Sensory exam normal sensation to light touch  in bilateral upper and lower extremities Cerebellar exam minimal dysmetria finger to nose to finger as well as heel to shin in bilateral upper and lower extremities Musculoskeletal: Full range of motion in all 4 extremities. No joint swelling   Lab Results Last 48 Hours        Results for orders placed or performed during the hospital encounter of 12/11/19 (from the past 48 hour(s))  CBC with Differential/Platelet     Status: Abnormal    Collection Time: 12/15/19  9:39 AM  Result Value Ref Range    WBC 23.9 (H) 4.0 - 10.5 K/uL    RBC 4.43 4.22 - 5.81 MIL/uL    Hemoglobin 14.1 13.0 - 17.0 g/dL    HCT 40.9 81.1 - 91.4 %    MCV 91.9 80.0 - 100.0 fL    MCH 31.8 26.0 - 34.0 pg    MCHC 34.6 30.0 - 36.0 g/dL    RDW 78.2 95.6 - 21.3 %    Platelets 230 150 - 400 K/uL    nRBC 0.0 0.0 - 0.2 %    Neutrophils Relative % 90 %  Neutro Abs 21.5 (H) 1.7 - 7.7 K/uL    Lymphocytes Relative 3 %    Lymphs Abs 0.7 0.7 - 4.0 K/uL    Monocytes Relative 6 %    Monocytes Absolute 1.5 (H) 0.1 - 1.0 K/uL    Eosinophils Relative 0 %    Eosinophils Absolute 0.0 0.0 - 0.5 K/uL    Basophils Relative 0 %    Basophils Absolute 0.0 0.0 - 0.1 K/uL    Immature Granulocytes 1 %    Abs  Immature Granulocytes 0.19 (H) 0.00 - 0.07 K/uL      Comment: Performed at [facility], [address], [place], [place] [address]  Comprehensive metabolic panel     Status: Abnormal    Collection Time: 12/15/19  9:39 AM  Result Value Ref Range    Sodium 140 135 - 145 mmol/L    Potassium 3.7 3.5 - 5.1 mmol/L    Chloride 104 98 - 111 mmol/L    CO2 26 22 - 32 mmol/L    Glucose, Bld 122 (H) 70 - 99 mg/dL      Comment: Glucose reference range applies only to samples taken after fasting for at least 8 hours.    BUN 13 6 - 20 mg/dL    Creatinine, Ser 4.78 0.61 - 1.24 mg/dL    Calcium 9.1 8.9 - 29.5 mg/dL    Total Protein 6.5 6.5 - 8.1 g/dL    Albumin 3.8 3.5 - 5.0 g/dL    AST 17 15 - 41 U/L    ALT 13 0 - 44 U/L    Alkaline Phosphatase 39 38 - 126 U/L    Total Bilirubin 0.6 0.3 - 1.2 mg/dL    GFR calc non Af Amer >60 >60 mL/min    GFR calc Af Amer >60 >60 mL/min    Anion gap 10 5 - 15      Comment: Performed at [facility], [address] [place], [place] [address]  Magnesium     Status: None    Collection Time: 12/15/19  9:39 AM  Result Value Ref Range    Magnesium 2.0 1.7 - 2.4 mg/dL      Comment: Performed at [facility], [address] [place], [place] [address]  Phosphorus     Status: None    Collection Time: 12/15/19  9:39 AM  Result Value Ref Range    Phosphorus 3.1 2.5 - 4.6 mg/dL      Comment: Performed at [facility], [address] [place], [place] [address]  Phosphorus     Status: None    Collection Time: 12/16/19  5:40 AM  Result Value Ref Range    Phosphorus 4.4 2.5 - 4.6 mg/dL      Comment: Performed at [facility], [address] [place], [place] [address]  Magnesium     Status: None    Collection Time: 12/16/19  5:40 AM  Result Value Ref Range    Magnesium 2.1 1.7 - 2.4 mg/dL      Comment: Performed at [facility], [address], [place], [place] [address]  Comprehensive metabolic panel     Status: Abnormal     Collection Time: 12/16/19  5:40 AM  Result Value Ref Range    Sodium 138 135 - 145 mmol/L    Potassium 4.0 3.5 - 5.1 mmol/L    Chloride 106 98 - 111 mmol/L    CO2 27 22 -  32 mmol/L    Glucose, Bld 112 (H) 70 - 99 mg/dL      Comment: Glucose reference range applies only to samples taken after fasting for at least 8 hours.    BUN 14 6 - 20 mg/dL    Creatinine, Ser 3.29 0.61 - 1.24 mg/dL    Calcium 8.8 (L) 8.9 - 10.3 mg/dL    Total Protein 5.8 (L) 6.5 - 8.1 g/dL    Albumin 3.3 (L) 3.5 - 5.0 g/dL    AST 19 15 - 41 U/L    ALT 14 0 - 44 U/L    Alkaline Phosphatase 34 (L) 38 - 126 U/L    Total Bilirubin 0.5 0.3 - 1.2 mg/dL    GFR calc non Af Amer >60 >60 mL/min    GFR calc Af Amer >60 >60 mL/min    Anion gap 5 5 - 15      Comment: Performed at [facility], [address] [place], [place] [address]  CBC with Differential/Platelet     Status: Abnormal    Collection Time: 12/16/19  5:40 AM  Result Value Ref Range    WBC 19.9 (H) 4.0 - 10.5 K/uL    RBC 4.29 4.22 - 5.81 MIL/uL    Hemoglobin 13.5 13.0 - 17.0 g/dL    HCT 83.4 19.6 - 22.2 %    MCV 91.4 80.0 - 100.0 fL    MCH 31.5 26.0 - 34.0 pg    MCHC 34.4 30.0 - 36.0 g/dL    RDW 97.9 89.2 - 11.9 %    Platelets 201 150 - 400 K/uL    nRBC 0.0 0.0 - 0.2 %    Neutrophils Relative % 90 %    Neutro Abs 17.9 (H) 1.7 - 7.7 K/uL    Lymphocytes Relative 4 %    Lymphs Abs 0.7 0.7 - 4.0 K/uL    Monocytes Relative 5 %    Monocytes Absolute 1.1 (H) 0.1 - 1.0 K/uL    Eosinophils Relative 0 %    Eosinophils Absolute 0.0 0.0 - 0.5 K/uL    Basophils Relative 0 %    Basophils Absolute 0.0 0.0 - 0.1 K/uL    Immature Granulocytes 1 %    Abs Immature Granulocytes 0.24 (H) 0.00 - 0.07 K/uL      Comment: Performed at [facility], [address], [place], [place] [address]  CBC with Differential/Platelet     Status: Abnormal    Collection Time: 12/17/19  4:20 AM  Result Value Ref Range    WBC 14.6 (H) 4.0 - 10.5 K/uL    RBC 4.50 4.22 -  5.81 MIL/uL    Hemoglobin 14.2 13.0 - 17.0 g/dL    HCT 81.4 48.1 - 85.6 %    MCV 90.7 80.0 - 100.0 fL    MCH 31.6 26.0 - 34.0 pg    MCHC 34.8 30.0 - 36.0 g/dL    RDW 31.4 97.0 - 26.3 %    Platelets 211 150 - 400 K/uL    nRBC 0.0 0.0 - 0.2 %    Neutrophils Relative % 79 %    Neutro Abs 11.6 (H) 1.7 - 7.7 K/uL    Lymphocytes Relative 11 %    Lymphs Abs 1.7 0.7 - 4.0 K/uL    Monocytes Relative 8 %    Monocytes Absolute 1.1 (H) 0.1 - 1.0 K/uL    Eosinophils Relative 0 %    Eosinophils Absolute 0.0 0.0 - 0.5 K/uL  Basophils Relative 0 %    Basophils Absolute 0.0 0.0 - 0.1 K/uL    Immature Granulocytes 2 %    Abs Immature Granulocytes 0.27 (H) 0.00 - 0.07 K/uL      Comment: Performed at [facility], [address] [place], [place] [address]  Comprehensive metabolic panel     Status: Abnormal    Collection Time: 12/17/19  4:20 AM  Result Value Ref Range    Sodium 139 135 - 145 mmol/L    Potassium 3.9 3.5 - 5.1 mmol/L    Chloride 100 98 - 111 mmol/L    CO2 27 22 - 32 mmol/L    Glucose, Bld 94 70 - 99 mg/dL      Comment: Glucose reference range applies only to samples taken after fasting for at least 8 hours.    BUN 15 6 - 20 mg/dL    Creatinine, Ser 8.25 0.61 - 1.24 mg/dL    Calcium 8.7 (L) 8.9 - 10.3 mg/dL    Total Protein 5.9 (L) 6.5 - 8.1 g/dL    Albumin 3.3 (L) 3.5 - 5.0 g/dL    AST 18 15 - 41 U/L    ALT 15 0 - 44 U/L    Alkaline Phosphatase 37 (L) 38 - 126 U/L    Total Bilirubin 0.4 0.3 - 1.2 mg/dL    GFR calc non Af Amer >60 >60 mL/min    GFR calc Af Amer >60 >60 mL/min    Anion gap 12 5 - 15      Comment: Performed at [facility], [address] [place], [place] [address]  Phosphorus     Status: None    Collection Time: 12/17/19  4:20 AM  Result Value Ref Range    Phosphorus 4.2 2.5 - 4.6 mg/dL      Comment: Performed at [facility], [address], [place], [place] [address]  Magnesium     Status: None    Collection Time: 12/17/19  4:20 AM    Result Value Ref Range    Magnesium 2.2 1.7 - 2.4 mg/dL      Comment: Performed at [facility], [address] [place], [place] [address]       Imaging Results (Last 48 hours)  MR BRAIN W WO CONTRAST   Result Date: 12/16/2019 CLINICAL DATA:  Demyelinating disease. Abnormal MRI of the head 12/11/2019. Interval treatment with IV steroid. EXAM: MRI HEAD WITHOUT AND WITH CONTRAST TECHNIQUE: Multiplanar, multiecho pulse sequences of the brain and surrounding structures were obtained without and with intravenous contrast. CONTRAST:  5.96mL GADAVIST GADOBUTROL 1 MMOL/ML IV SOLN COMPARISON:  MR the head without and with contrast 12/11/2019 FINDINGS: Brain: Numerous periventricular and subcortical T2 hyperintensities bilaterally are slightly less well defined than on the prior exam. No new lesions or expansion are seen. Diffusion signal is mostly secondary to shine through. No significant restricted diffusion is present. Postcontrast imaging demonstrates no residual enhancement of the numerous T2 hyperintense lesions. The ventricles are of normal size. No significant extraaxial fluid collection is present. The internal auditory canals are within normal limits. Vascular: Flow is present in the major intracranial arteries. Skull and upper cervical spine: The craniocervical junction is normal. Upper cervical spine is within normal limits. Marrow signal is unremarkable. Sinuses/Orbits: The paranasal sinuses and mastoid air cells are clear. The globes and orbits are within normal limits. IMPRESSION: 1. Innumerable scattered white matter lesions are similar in size and number to  the prior exam. The lesions are slightly less well-defined than on the prior study. 2. Previously seen enhancement is no longer present. This likely represents some response to the interval therapy. 3. No new lesions or evidence for ongoing active demyelination. Electronically Signed   By: Marin Roberts M.D.   On: 12/16/2019 19:33     MR THORACIC SPINE W WO CONTRAST   Result Date: 12/16/2019 CLINICAL DATA:  Diffuse demyelination. Follow-up after steroid therapy. EXAM: MRI THORACIC WITHOUT AND WITH CONTRAST TECHNIQUE: Multiplanar and multiecho pulse sequences of the thoracic spine were obtained without and with intravenous contrast. CONTRAST:  5.49mL GADAVIST GADOBUTROL 1 MMOL/ML IV SOLN COMPARISON:  MRI of the thoracic spine 12/11/19 FINDINGS: MRI THORACIC SPINE FINDINGS Alignment:  No significant listhesis is present. Vertebrae: Marrow signal and vertebral body heights are normal. Cord: Faint enhancement on the right at T1-2 is near completely resolved. No enhancement remains in the lower thoracic spine. Numerous T2 hyperintensities within the cord slightly less well defined than on the prior exam. No new lesions are present. The cord is not expanded. Paraspinal and other soft tissues: Paraspinous soft tissues are within limits. Lungs are clear. Disc levels: Focal disc disease at T4-5 and T6-7 is again noted. The central protrusion at T6-7 contacts and slightly distorts the ventral surface the cord. Foramina are patent bilaterally. IMPRESSION: 1. Extensive T2 lesions throughout the thoracic spinal cord is slightly less well-defined than on the prior exam. 2. Previously noted enhancement at the T1-2 level is near completely resolved. This likely reflects response to steroid treatment. 3. No evidence for disease progression. Electronically Signed   By: Marin Roberts M.D.   On: 12/16/2019 19:39             Medical Problem List and Plan: 1.  Decreased functional ability with diffuse weakness secondary to multiple sclerosis.  IV Solu-Medrol completed.  Plan follow-up outpatient Dr. Epimenio Foot.             -patient may  shower             -ELOS/Goals: 9-12d 2.  Antithrombotics: -DVT/anticoagulation: Lovenox.  Check vascular study             -antiplatelet therapy: N/A 3. Pain Management: Tylenol as needed 4. Mood: Provide  emotional support             -antipsychotic agents: N/A 5. Neuropsych: This patient is capable of making decisions on his own behalf. 6. Skin/Wound Care: Routine skin checks 7. Fluids/Electrolytes/Nutrition: Routine in and outs with follow-up chemistries 8.  High-level functioning autism.  Patient appears to be at baseline.  Will discuss with mother and family.          Mcarthur Rossetti Angiulli, PA-C 12/17/2019  "I have personally performed a face to face diagnostic evaluation of this patient.  Additionally, I have reviewed and concur with the physician assistant's documentation above." Erick Colace M.D. [facility] FAAPM&R (Neuromuscular Med) Diplomate Am Board of Electrodiagnostic Med Fellow Am Board of Interventional Pain

## 2019-12-17 NOTE — Progress Notes (Signed)
Physical Medicine and Rehabilitation Consult Reason for Consult: Decreased functional mobility Referring Physician: [facility]     HPI: Jaime [name] is a 24 y.o. right-handed male with history of depression as well as documented high functioning autism.  Per chart review patient lives with mother and 3 younger siblings.  Second floor apartment.  Poorly independent prior to admission and had been volunteering at [facility].  Presented 12/12/2019 with weakness, blurred vision and falls x3 which has progressed over the last 3 months.  On one occasion he did strike his head without loss of consciousness.  Denies any bowel or bladder disturbances.  MRI showed patchy signal abnormality within the visualized distal cord consistent with demyelinating disease.  No abnormal enhancement to suggest active demyelination.  Tiny central disc protrusion at L5-S1 without stenosis or impingement.  Admission chemistries unremarkable except potassium 3.4, SARS coronavirus negative.  LP completed CSF no growth, glucose 57, protein 33.  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  Therapy evaluations completed with recommendations of physical medicine rehab consult.   Plan is since getting better, to continue Steroids x 2 additional days.    Also pt c/o blurry vision but better with steroids- Also R hand was stiff off and on since December- hasn't taken long walks since early February.  Also mother and pt report bladder accidents- knows he needs to go, however couldn't get there fast enough.      Review of Systems  Constitutional: Negative for chills and fever.  HENT: Negative for hearing loss.   Eyes: Positive for blurred vision and double vision.  Respiratory: Negative for cough.   Cardiovascular: Negative for chest pain and palpitations.  Gastrointestinal: Positive for constipation. Negative for heartburn, nausea and  vomiting.  Genitourinary: Negative for dysuria and hematuria.  Musculoskeletal: Positive for falls.  Skin: Negative for rash.  Psychiatric/Behavioral: Positive for depression.  All other systems reviewed and are negative.       Past Medical History:  Diagnosis Date  . Autism    . Depression      History reviewed. No pertinent surgical history.      Family History  Problem Relation Age of Onset  . Clotting disorder Mother      Social History:  reports that he has never smoked. He has never used smokeless tobacco. He reports that he does not drink alcohol or use drugs. Allergies: No Known Allergies No medications prior to admission.      Home: Home Living Family/patient expects to be discharged to:: Private residence Living Arrangements: Parent, Other relatives(mother, 3 younger siblings) Available Help at Discharge: Family Type of Home: Apartment(possibly 2nd floor apt) Home Access: Stairs to enter Entrance Stairs-Number of Steps: flight Entrance Stairs-Rails: Right Home Layout: One level Bathroom Shower/Tub: Engineer, manufacturing systems: Standard Home Equipment: None Additional Comments: Will need to confirm information with mother as pt with slight difficulty describing details of home  Functional History: Prior Function Level of Independence: Independent Comments: Enjoys playing guitar, splatter paint, and was volunteering at [facility]. Functional Status:  Mobility: Bed Mobility Overal bed mobility: Needs Assistance Bed Mobility: Supine to Sit Supine to sit: Supervision, HOB elevated General bed mobility comments: supervision for safety Transfers Overall transfer level: Needs assistance Equipment used: 1 person hand held assist Transfers: Sit to/from Stand Sit to Stand: Min assist, +2 safety/equipment General transfer comment: assist to Ambulation/Gait Ambulation/Gait assistance: Mod assist, +2  safety/equipment Gait Distance (Feet): 20 Feet Assistive device:  1 person hand held assist Gait Pattern/deviations: Wide base of support, Ataxic, Drifts right/left General Gait Details: very unsteady, occasional steppage pattern, assist to maintain balance Gait velocity: decreased Gait velocity interpretation: <1.8 ft/sec, indicate of risk for recurrent falls   ADL: ADL Overall ADL's : Needs assistance/impaired Eating/Feeding: Set up, Sitting Grooming: Set up, Supervision/safety, Sitting Upper Body Bathing: Min guard, Sitting Lower Body Bathing: Moderate assistance, Sit to/from stand Upper Body Dressing : Min guard, Sitting Upper Body Dressing Details (indicate cue type and reason): Donning second gown like a jacket Lower Body Dressing: Moderate assistance, Sit to/from stand Lower Body Dressing Details (indicate cue type and reason): Noting decreased balance when bending forward to adjust sock Toilet Transfer: Moderate assistance, +2 for safety/equipment, Ambulation(simulated to recliner) Functional mobility during ADLs: Moderate assistance, +2 for safety/equipment General ADL Comments: Pt presenting with significant weakness and poor coorindation at BLEs impacting his safe performance of ADLs and mobility   Cognition: Cognition Overall Cognitive Status: Within Functional Limits for tasks assessed Orientation Level: Oriented X4 Cognition Arousal/Alertness: Awake/alert Behavior During Therapy: WFL for tasks assessed/performed Overall Cognitive Status: Within Functional Limits for tasks assessed General Comments: Baseline autism diagnosis. Very pleasant and agreeable to therapy. Follow all cues and requiring increased time for processing.    Blood pressure 102/75, pulse 90, temperature 98.6 F (37 C), temperature source Oral, resp. rate 15, height  (1.854 m), weight 54.4 kg, SpO2 98 %. Physical Exam  Nursing note and vitals reviewed. Constitutional: He is oriented to person, place, and time. He appears well-developed and well-nourished.  Pt  is young man sitting up in chair at bedside; mother at bedside; pt is stiff in manner but appropriate due to autism, NAD  HENT:  Head: Normocephalic and atraumatic.  Nose: Nose normal.  Mouth/Throat: Oropharynx is clear and moist. No oropharyngeal exudate.  No facial droop; facial sensation intact; tongue is midline; shoulder shrug is intact   Eyes:  EOMI B/L however has horizontal and vertical nystagmus B/L- very pronounced.  Neck: No tracheal deviation present.  Cardiovascular: Normal rate, regular rhythm and normal heart sounds.  No murmur heard. Respiratory: Effort normal and breath sounds normal. No stridor. No respiratory distress.  CTA B/L  GI: Soft. Bowel sounds are normal. He exhibits no distension. There is no abdominal tenderness.  Musculoskeletal:     Cervical back: Normal range of motion and neck supple.     Comments: RUE- delt 4+/5, biceps 4+/5, triceps 4+/5; WE, grip 5-/5; finger abd 4+/5  LUE- deltm biceps and triceps 4+/5; WE and grip 5-/5; finger abd 4+/5  RLE and LLE- HF 4+/5, KE 4+/5, KF 4+/5; DF 4/5, and PF 4/5   Neurological: He is alert and oriented to person, place, and time.  Patient is alert no acute distress.  Speech is mildly dysarthric.  Follows commands.  Oriented to person place and time.  Limited medical historian.  Pt is stilted in manner and speech, but appropriate.  His sensation is decreased in RUE, however reports intact in LUE and LEs B/L   Skin:  Long nails and hair; a few scratches seen on hands and fingers from previous fall- a scrape on L elbow.  IV with no infiltrate.   Psychiatric:  Stilted; appropriate.       Lab Results Last 24 Hours       Results for orders placed or performed during the hospital encounter of 12/11/19 (from the past 24 hour(s))  Glucose, capillary     Status: Abnormal    Collection Time: 12/12/19  9:31 PM  Result Value Ref Range    Glucose-Capillary 146 (H) 70 - 99 mg/dL  CBC with Differential/Platelet      Status: Abnormal    Collection Time: 12/13/19  9:44 AM  Result Value Ref Range    WBC 25.9 (H) 4.0 - 10.5 K/uL    RBC 4.90 4.22 - 5.81 MIL/uL    Hemoglobin 15.4 13.0 - 17.0 g/dL    HCT 44.8 39.0 - 52.0 %    MCV 91.4 80.0 - 100.0 fL    MCH 31.4 26.0 - 34.0 pg    MCHC 34.4 30.0 - 36.0 g/dL    RDW 13.2 11.5 - 15.5 %    Platelets 249 150 - 400 K/uL    nRBC 0.0 0.0 - 0.2 %    Neutrophils Relative % 94 %    Neutro Abs 24.3 (H) 1.7 - 7.7 K/uL    Lymphocytes Relative 2 %    Lymphs Abs 0.5 (L) 0.7 - 4.0 K/uL    Monocytes Relative 4 %    Monocytes Absolute 1.0 0.1 - 1.0 K/uL    Eosinophils Relative 0 %    Eosinophils Absolute 0.0 0.0 - 0.5 K/uL    Basophils Relative 0 %    Basophils Absolute 0.0 0.0 - 0.1 K/uL    WBC Morphology See Note      nRBC 0 0 /100 WBC    Abs Immature Granulocytes 0.00 0.00 - 0.07 K/uL  Comprehensive metabolic panel     Status: Abnormal    Collection Time: 12/13/19  9:44 AM  Result Value Ref Range    Sodium 140 135 - 145 mmol/L    Potassium 4.1 3.5 - 5.1 mmol/L    Chloride 105 98 - 111 mmol/L    CO2 23 22 - 32 mmol/L    Glucose, Bld 145 (H) 70 - 99 mg/dL    BUN 16 6 - 20 mg/dL    Creatinine, Ser 1.08 0.61 - 1.24 mg/dL    Calcium 9.5 8.9 - 10.3 mg/dL    Total Protein 7.1 6.5 - 8.1 g/dL    Albumin 4.3 3.5 - 5.0 g/dL    AST 21 15 - 41 U/L    ALT 15 0 - 44 U/L    Alkaline Phosphatase 44 38 - 126 U/L    Total Bilirubin 1.1 0.3 - 1.2 mg/dL    GFR calc non Af Amer >60 >60 mL/min    GFR calc Af Amer >60 >60 mL/min    Anion gap 12 5 - 15  Magnesium     Status: None    Collection Time: 12/13/19  9:44 AM  Result Value Ref Range    Magnesium 1.8 1.7 - 2.4 mg/dL  Phosphorus     Status: None    Collection Time: 12/13/19  9:44 AM  Result Value Ref Range    Phosphorus 3.7 2.5 - 4.6 mg/dL       Imaging Results (Last 48 hours)  MR Brain W and Wo Contrast   Result Date: 12/11/2019 CLINICAL DATA:  Initial evaluation for progressive ataxia, concern for stroke,  multiple sclerosis. EXAM: MRI HEAD WITHOUT AND WITH CONTRAST MRI CERVICAL, THORACIC, AND LUMBAR SPINE WITHOUT AND WITH CONTRAST TECHNIQUE: Multisequence MR imaging of the spine from the cervical spine to the sacrum was performed prior to and following IV contrast administration for evaluation of spinal metastatic disease. CONTRAST:  48mL GADAVIST GADOBUTROL  1 MMOL/ML IV SOLN COMPARISON:  None available. FINDINGS: MRI HEAD SPINE FINDINGS Brain: Cerebral volume within normal limits for age. Innumerable scattered foci of T2/FLAIR hyperintensity seen involving the periventricular, deep, and juxta cortical white matter of both cerebral hemispheres. Many of these foci are oriented perpendicular to the lateral ventricles. Extensive involvement of the deep gray nuclei, brainstem, and cerebellar peduncles. Associated multiple scattered T1 black holes. For reference purposes, largest focus seen at the posterior left centrum semi ovale and measures approximately 2 cm. 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 Extensive enhancement seen about multiple lesions following contrast administration, compatible with active demyelination. No evidence for acute or subacute infarct. No encephalomalacia to suggest chronic infarction. No evidence for acute or chronic intracranial hemorrhage. No mass lesion or mass effect. No hydrocephalus. No extra-axial fluid collection. Pituitary gland suprasellar region normal. Midline structures intact. Vascular: Major intracranial vascular flow voids are maintained. Skull and upper cervical spine: Craniocervical junction normal. Bone marrow signal intensity within normal limits. No scalp soft tissue abnormality. Sinuses/Orbits: Globes and orbital soft tissues within normal limits. Scattered mucosal thickening noted throughout the paranasal sinuses, with small volume layering fluid within the lateral recess of the left sphenoid sinus. No mastoid effusion. Inner ear structures  grossly normal. Other: None. MRI CERVICAL SPINE FINDINGS Alignment: Straightening of the normal cervical lordosis. No listhesis. Vertebrae: Vertebral body height maintained without evidence for acute or chronic fracture. Bone marrow signal intensity normal. No discrete or worrisome osseous lesions. No abnormal marrow edema or enhancement. Cord: Extensive signal abnormality seen involving the majority of the cervical spine, extending from the cervicomedullary junction through the upper thoracic spine, compatible with demyelinating disease/multiple sclerosis. For reference purposes, most prominent of these lesions involves the right dorsal cord at the level of C2 (series 7, image 3). No definite associated enhancement to suggest active demyelination, although evaluation somewhat limited by motion artifact. Underlying cord is somewhat atrophic in appearance. Posterior Fossa, vertebral arteries, paraspinal tissues: Unremarkable. Disc levels: No significant disc pathology seen within the cervical spine. No significant facet degeneration. No canal or neural foraminal stenosis. No impingement. MRI THORACIC SPINE FINDINGS Alignment: Physiologic with preservation of the normal thoracic kyphosis. No listhesis. Vertebrae: Vertebral body height maintained without evidence for acute or chronic fracture. Bone marrow signal intensity within normal limits. No discrete or worrisome osseous lesions. No abnormal marrow edema or enhancement. Cord: Extensive signal abnormality seen throughout the thoracic spinal cord, essentially involving nearly all levels. Finding consistent with demyelinating disease. Patchy postcontrast enhancement involving the cord at the level of T1-2 consistent with active demyelination (series 32, image 8). Suspected additional faint patchy post-contrast enhancement at the level of T10-11 (series 32, image 8). Underlying cord is mildly atrophic in appearance. Paraspinal and other soft tissues: Unremarkable.  Disc levels: T6-7: Small central disc protrusion indents the ventral thecal sac, contacting and minimally flattening the ventral cord. No significant stenosis. No other significant disc pathology seen within the thoracic spine. No other stenosis or neural impingement. MRI LUMBAR SPINE FINDINGS Segmentation:  Standard. Alignment:  Physiologic. Vertebrae: Vertebral body height maintained without evidence for acute or chronic fracture. Bone marrow signal intensity normal. No discrete or worrisome osseous lesions. No abnormal marrow edema or enhancement. Conus medullaris: Extends to the L1-2 level. Patchy signal abnormality seen within the distal cord, consistent with demyelinating disease. Finding better evaluated on concomitant thoracic spine MRI. No appreciable abnormal enhancement. Nerve roots of the cauda equina within normal limits. Paraspinal and other soft tissues: Unremarkable. Disc levels: L1-2:  Unremarkable. L2-3:  Unremarkable. L3-4:  Unremarkable. L4-5:  Unremarkable. L5-S1: Tiny central disc protrusion with slight inferior migration. No significant spinal stenosis or neural impingement. Foramina remain patent. IMPRESSION: MRI HEAD IMPRESSION: Extensive T2/FLAIR signal abnormality scattered throughout the supratentorial and infratentorial brain, consistent with demyelinating disease/multiple sclerosis. Associated enhancement about numerous lesions, consistent with active demyelination. MRI CERVICAL SPINE IMPRESSION: Extensive signal abnormality involving essentially the majority of the cervical spinal cord, consistent with demyelinating disease. No abnormal enhancement to suggest active demyelination. MRI THORACIC SPINE IMPRESSION: 1. Extensive signal abnormality involving essentially the entirety of the thoracic spinal cord, consistent with demyelinating disease. Patchy postcontrast enhancement at the level of T1-2 and T10-11 consistent with active demyelination. 2. Small central disc protrusion at T6-7  without significant stenosis. MRI LUMBAR SPINE IMPRESSION: 1. Patchy signal abnormality within the visualized distal cord, consistent with demyelinating disease. No abnormal enhancement to suggest active demyelination. 2. Tiny central disc protrusion at L5-S1 without stenosis or impingement. Electronically Signed   By: Rise Mu M.D.   On: 12/11/2019 22:35    MR CERVICAL SPINE W WO CONTRAST   Result Date: 12/11/2019 CLINICAL DATA:  Initial evaluation for progressive ataxia, concern for stroke, multiple sclerosis. EXAM: MRI HEAD WITHOUT AND WITH CONTRAST MRI CERVICAL, THORACIC, AND LUMBAR SPINE WITHOUT AND WITH CONTRAST TECHNIQUE: Multisequence MR imaging of the spine from the cervical spine to the sacrum was performed prior to and following IV contrast administration for evaluation of spinal metastatic disease. CONTRAST:  69mL GADAVIST GADOBUTROL 1 MMOL/ML IV SOLN COMPARISON:  None available. FINDINGS: MRI HEAD SPINE FINDINGS Brain: Cerebral volume within normal limits for age. Innumerable scattered foci of T2/FLAIR hyperintensity seen involving the periventricular, deep, and juxta cortical white matter of both cerebral hemispheres. Many of these foci are oriented perpendicular to the lateral ventricles. Extensive involvement of the deep gray nuclei, brainstem, and cerebellar peduncles. Associated multiple scattered T1 black holes. For reference purposes, largest focus seen at the posterior left centrum semi ovale and measures approximately 2 cm. 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 Extensive enhancement seen about multiple lesions following contrast administration, compatible with active demyelination. No evidence for acute or subacute infarct. No encephalomalacia to suggest chronic infarction. No evidence for acute or chronic intracranial hemorrhage. No mass lesion or mass effect. No hydrocephalus. No extra-axial fluid collection. Pituitary gland suprasellar region  normal. Midline structures intact. Vascular: Major intracranial vascular flow voids are maintained. Skull and upper cervical spine: Craniocervical junction normal. Bone marrow signal intensity within normal limits. No scalp soft tissue abnormality. Sinuses/Orbits: Globes and orbital soft tissues within normal limits. Scattered mucosal thickening noted throughout the paranasal sinuses, with small volume layering fluid within the lateral recess of the left sphenoid sinus. No mastoid effusion. Inner ear structures grossly normal. Other: None. MRI CERVICAL SPINE FINDINGS Alignment: Straightening of the normal cervical lordosis. No listhesis. Vertebrae: Vertebral body height maintained without evidence for acute or chronic fracture. Bone marrow signal intensity normal. No discrete or worrisome osseous lesions. No abnormal marrow edema or enhancement. Cord: Extensive signal abnormality seen involving the majority of the cervical spine, extending from the cervicomedullary junction through the upper thoracic spine, compatible with demyelinating disease/multiple sclerosis. For reference purposes, most prominent of these lesions involves the right dorsal cord at the level of C2 (series 7, image 3). No definite associated enhancement to suggest active demyelination, although evaluation somewhat limited by motion artifact. Underlying cord is somewhat atrophic in appearance. Posterior Fossa, vertebral arteries, paraspinal tissues: Unremarkable. Disc levels: No significant disc  pathology seen within the cervical spine. No significant facet degeneration. No canal or neural foraminal stenosis. No impingement. MRI THORACIC SPINE FINDINGS Alignment: Physiologic with preservation of the normal thoracic kyphosis. No listhesis. Vertebrae: Vertebral body height maintained without evidence for acute or chronic fracture. Bone marrow signal intensity within normal limits. No discrete or worrisome osseous lesions. No abnormal marrow edema or  enhancement. Cord: Extensive signal abnormality seen throughout the thoracic spinal cord, essentially involving nearly all levels. Finding consistent with demyelinating disease. Patchy postcontrast enhancement involving the cord at the level of T1-2 consistent with active demyelination (series 32, image 8). Suspected additional faint patchy post-contrast enhancement at the level of T10-11 (series 32, image 8). Underlying cord is mildly atrophic in appearance. Paraspinal and other soft tissues: Unremarkable. Disc levels: T6-7: Small central disc protrusion indents the ventral thecal sac, contacting and minimally flattening the ventral cord. No significant stenosis. No other significant disc pathology seen within the thoracic spine. No other stenosis or neural impingement. MRI LUMBAR SPINE FINDINGS Segmentation:  Standard. Alignment:  Physiologic. Vertebrae: Vertebral body height maintained without evidence for acute or chronic fracture. Bone marrow signal intensity normal. No discrete or worrisome osseous lesions. No abnormal marrow edema or enhancement. Conus medullaris: Extends to the L1-2 level. Patchy signal abnormality seen within the distal cord, consistent with demyelinating disease. Finding better evaluated on concomitant thoracic spine MRI. No appreciable abnormal enhancement. Nerve roots of the cauda equina within normal limits. Paraspinal and other soft tissues: Unremarkable. Disc levels: L1-2:  Unremarkable. L2-3:  Unremarkable. L3-4:  Unremarkable. L4-5:  Unremarkable. L5-S1: Tiny central disc protrusion with slight inferior migration. No significant spinal stenosis or neural impingement. Foramina remain patent. IMPRESSION: MRI HEAD IMPRESSION: Extensive T2/FLAIR signal abnormality scattered throughout the supratentorial and infratentorial brain, consistent with demyelinating disease/multiple sclerosis. Associated enhancement about numerous lesions, consistent with active demyelination. MRI CERVICAL  SPINE IMPRESSION: Extensive signal abnormality involving essentially the majority of the cervical spinal cord, consistent with demyelinating disease. No abnormal enhancement to suggest active demyelination. MRI THORACIC SPINE IMPRESSION: 1. Extensive signal abnormality involving essentially the entirety of the thoracic spinal cord, consistent with demyelinating disease. Patchy postcontrast enhancement at the level of T1-2 and T10-11 consistent with active demyelination. 2. Small central disc protrusion at T6-7 without significant stenosis. MRI LUMBAR SPINE IMPRESSION: 1. Patchy signal abnormality within the visualized distal cord, consistent with demyelinating disease. No abnormal enhancement to suggest active demyelination. 2. Tiny central disc protrusion at L5-S1 without stenosis or impingement. Electronically Signed   By: Rise Mu M.D.   On: 12/11/2019 22:35    MR THORACIC SPINE W WO CONTRAST   Result Date: 12/11/2019 CLINICAL DATA:  Initial evaluation for progressive ataxia, concern for stroke, multiple sclerosis. EXAM: MRI HEAD WITHOUT AND WITH CONTRAST MRI CERVICAL, THORACIC, AND LUMBAR SPINE WITHOUT AND WITH CONTRAST TECHNIQUE: Multisequence MR imaging of the spine from the cervical spine to the sacrum was performed prior to and following IV contrast administration for evaluation of spinal metastatic disease. CONTRAST:  21mL GADAVIST GADOBUTROL 1 MMOL/ML IV SOLN COMPARISON:  None available. FINDINGS: MRI HEAD SPINE FINDINGS Brain: Cerebral volume within normal limits for age. Innumerable scattered foci of T2/FLAIR hyperintensity seen involving the periventricular, deep, and juxta cortical white matter of both cerebral hemispheres. Many of these foci are oriented perpendicular to the lateral ventricles. Extensive involvement of the deep gray nuclei, brainstem, and cerebellar peduncles. Associated multiple scattered T1 black holes. For reference purposes, largest focus seen at the posterior left  centrum semi ovale  and measures approximately 2 cm. 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 Extensive enhancement seen about multiple lesions following contrast administration, compatible with active demyelination. No evidence for acute or subacute infarct. No encephalomalacia to suggest chronic infarction. No evidence for acute or chronic intracranial hemorrhage. No mass lesion or mass effect. No hydrocephalus. No extra-axial fluid collection. Pituitary gland suprasellar region normal. Midline structures intact. Vascular: Major intracranial vascular flow voids are maintained. Skull and upper cervical spine: Craniocervical junction normal. Bone marrow signal intensity within normal limits. No scalp soft tissue abnormality. Sinuses/Orbits: Globes and orbital soft tissues within normal limits. Scattered mucosal thickening noted throughout the paranasal sinuses, with small volume layering fluid within the lateral recess of the left sphenoid sinus. No mastoid effusion. Inner ear structures grossly normal. Other: None. MRI CERVICAL SPINE FINDINGS Alignment: Straightening of the normal cervical lordosis. No listhesis. Vertebrae: Vertebral body height maintained without evidence for acute or chronic fracture. Bone marrow signal intensity normal. No discrete or worrisome osseous lesions. No abnormal marrow edema or enhancement. Cord: Extensive signal abnormality seen involving the majority of the cervical spine, extending from the cervicomedullary junction through the upper thoracic spine, compatible with demyelinating disease/multiple sclerosis. For reference purposes, most prominent of these lesions involves the right dorsal cord at the level of C2 (series 7, image 3). No definite associated enhancement to suggest active demyelination, although evaluation somewhat limited by motion artifact. Underlying cord is somewhat atrophic in appearance. Posterior Fossa, vertebral arteries, paraspinal  tissues: Unremarkable. Disc levels: No significant disc pathology seen within the cervical spine. No significant facet degeneration. No canal or neural foraminal stenosis. No impingement. MRI THORACIC SPINE FINDINGS Alignment: Physiologic with preservation of the normal thoracic kyphosis. No listhesis. Vertebrae: Vertebral body height maintained without evidence for acute or chronic fracture. Bone marrow signal intensity within normal limits. No discrete or worrisome osseous lesions. No abnormal marrow edema or enhancement. Cord: Extensive signal abnormality seen throughout the thoracic spinal cord, essentially involving nearly all levels. Finding consistent with demyelinating disease. Patchy postcontrast enhancement involving the cord at the level of T1-2 consistent with active demyelination (series 32, image 8). Suspected additional faint patchy post-contrast enhancement at the level of T10-11 (series 32, image 8). Underlying cord is mildly atrophic in appearance. Paraspinal and other soft tissues: Unremarkable. Disc levels: T6-7: Small central disc protrusion indents the ventral thecal sac, contacting and minimally flattening the ventral cord. No significant stenosis. No other significant disc pathology seen within the thoracic spine. No other stenosis or neural impingement. MRI LUMBAR SPINE FINDINGS Segmentation:  Standard. Alignment:  Physiologic. Vertebrae: Vertebral body height maintained without evidence for acute or chronic fracture. Bone marrow signal intensity normal. No discrete or worrisome osseous lesions. No abnormal marrow edema or enhancement. Conus medullaris: Extends to the L1-2 level. Patchy signal abnormality seen within the distal cord, consistent with demyelinating disease. Finding better evaluated on concomitant thoracic spine MRI. No appreciable abnormal enhancement. Nerve roots of the cauda equina within normal limits. Paraspinal and other soft tissues: Unremarkable. Disc levels: L1-2:   Unremarkable. L2-3:  Unremarkable. L3-4:  Unremarkable. L4-5:  Unremarkable. L5-S1: Tiny central disc protrusion with slight inferior migration. No significant spinal stenosis or neural impingement. Foramina remain patent. IMPRESSION: MRI HEAD IMPRESSION: Extensive T2/FLAIR signal abnormality scattered throughout the supratentorial and infratentorial brain, consistent with demyelinating disease/multiple sclerosis. Associated enhancement about numerous lesions, consistent with active demyelination. MRI CERVICAL SPINE IMPRESSION: Extensive signal abnormality involving essentially the majority of the cervical spinal cord, consistent with demyelinating disease. No abnormal enhancement  to suggest active demyelination. MRI THORACIC SPINE IMPRESSION: 1. Extensive signal abnormality involving essentially the entirety of the thoracic spinal cord, consistent with demyelinating disease. Patchy postcontrast enhancement at the level of T1-2 and T10-11 consistent with active demyelination. 2. Small central disc protrusion at T6-7 without significant stenosis. MRI LUMBAR SPINE IMPRESSION: 1. Patchy signal abnormality within the visualized distal cord, consistent with demyelinating disease. No abnormal enhancement to suggest active demyelination. 2. Tiny central disc protrusion at L5-S1 without stenosis or impingement. Electronically Signed   By: Rise Mu M.D.   On: 12/11/2019 22:35    MR Lumbar Spine W Wo Contrast   Result Date: 12/11/2019 CLINICAL DATA:  Initial evaluation for progressive ataxia, concern for stroke, multiple sclerosis. EXAM: MRI HEAD WITHOUT AND WITH CONTRAST MRI CERVICAL, THORACIC, AND LUMBAR SPINE WITHOUT AND WITH CONTRAST TECHNIQUE: Multisequence MR imaging of the spine from the cervical spine to the sacrum was performed prior to and following IV contrast administration for evaluation of spinal metastatic disease. CONTRAST:  5mL GADAVIST GADOBUTROL 1 MMOL/ML IV SOLN COMPARISON:  None available.  FINDINGS: MRI HEAD SPINE FINDINGS Brain: Cerebral volume within normal limits for age. Innumerable scattered foci of T2/FLAIR hyperintensity seen involving the periventricular, deep, and juxta cortical white matter of both cerebral hemispheres. Many of these foci are oriented perpendicular to the lateral ventricles. Extensive involvement of the deep gray nuclei, brainstem, and cerebellar peduncles. Associated multiple scattered T1 black holes. For reference purposes, largest focus seen at the posterior left centrum semi ovale and measures approximately 2 cm. 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 Extensive enhancement seen about multiple lesions following contrast administration, compatible with active demyelination. No evidence for acute or subacute infarct. No encephalomalacia to suggest chronic infarction. No evidence for acute or chronic intracranial hemorrhage. No mass lesion or mass effect. No hydrocephalus. No extra-axial fluid collection. Pituitary gland suprasellar region normal. Midline structures intact. Vascular: Major intracranial vascular flow voids are maintained. Skull and upper cervical spine: Craniocervical junction normal. Bone marrow signal intensity within normal limits. No scalp soft tissue abnormality. Sinuses/Orbits: Globes and orbital soft tissues within normal limits. Scattered mucosal thickening noted throughout the paranasal sinuses, with small volume layering fluid within the lateral recess of the left sphenoid sinus. No mastoid effusion. Inner ear structures grossly normal. Other: None. MRI CERVICAL SPINE FINDINGS Alignment: Straightening of the normal cervical lordosis. No listhesis. Vertebrae: Vertebral body height maintained without evidence for acute or chronic fracture. Bone marrow signal intensity normal. No discrete or worrisome osseous lesions. No abnormal marrow edema or enhancement. Cord: Extensive signal abnormality seen involving the majority of  the cervical spine, extending from the cervicomedullary junction through the upper thoracic spine, compatible with demyelinating disease/multiple sclerosis. For reference purposes, most prominent of these lesions involves the right dorsal cord at the level of C2 (series 7, image 3). No definite associated enhancement to suggest active demyelination, although evaluation somewhat limited by motion artifact. Underlying cord is somewhat atrophic in appearance. Posterior Fossa, vertebral arteries, paraspinal tissues: Unremarkable. Disc levels: No significant disc pathology seen within the cervical spine. No significant facet degeneration. No canal or neural foraminal stenosis. No impingement. MRI THORACIC SPINE FINDINGS Alignment: Physiologic with preservation of the normal thoracic kyphosis. No listhesis. Vertebrae: Vertebral body height maintained without evidence for acute or chronic fracture. Bone marrow signal intensity within normal limits. No discrete or worrisome osseous lesions. No abnormal marrow edema or enhancement. Cord: Extensive signal abnormality seen throughout the thoracic spinal cord, essentially involving nearly all levels. Finding consistent  with demyelinating disease. Patchy postcontrast enhancement involving the cord at the level of T1-2 consistent with active demyelination (series 32, image 8). Suspected additional faint patchy post-contrast enhancement at the level of T10-11 (series 32, image 8). Underlying cord is mildly atrophic in appearance. Paraspinal and other soft tissues: Unremarkable. Disc levels: T6-7: Small central disc protrusion indents the ventral thecal sac, contacting and minimally flattening the ventral cord. No significant stenosis. No other significant disc pathology seen within the thoracic spine. No other stenosis or neural impingement. MRI LUMBAR SPINE FINDINGS Segmentation:  Standard. Alignment:  Physiologic. Vertebrae: Vertebral body height maintained without evidence for  acute or chronic fracture. Bone marrow signal intensity normal. No discrete or worrisome osseous lesions. No abnormal marrow edema or enhancement. Conus medullaris: Extends to the L1-2 level. Patchy signal abnormality seen within the distal cord, consistent with demyelinating disease. Finding better evaluated on concomitant thoracic spine MRI. No appreciable abnormal enhancement. Nerve roots of the cauda equina within normal limits. Paraspinal and other soft tissues: Unremarkable. Disc levels: L1-2:  Unremarkable. L2-3:  Unremarkable. L3-4:  Unremarkable. L4-5:  Unremarkable. L5-S1: Tiny central disc protrusion with slight inferior migration. No significant spinal stenosis or neural impingement. Foramina remain patent. IMPRESSION: MRI HEAD IMPRESSION: Extensive T2/FLAIR signal abnormality scattered throughout the supratentorial and infratentorial brain, consistent with demyelinating disease/multiple sclerosis. Associated enhancement about numerous lesions, consistent with active demyelination. MRI CERVICAL SPINE IMPRESSION: Extensive signal abnormality involving essentially the majority of the cervical spinal cord, consistent with demyelinating disease. No abnormal enhancement to suggest active demyelination. MRI THORACIC SPINE IMPRESSION: 1. Extensive signal abnormality involving essentially the entirety of the thoracic spinal cord, consistent with demyelinating disease. Patchy postcontrast enhancement at the level of T1-2 and T10-11 consistent with active demyelination. 2. Small central disc protrusion at T6-7 without significant stenosis. MRI LUMBAR SPINE IMPRESSION: 1. Patchy signal abnormality within the visualized distal cord, consistent with demyelinating disease. No abnormal enhancement to suggest active demyelination. 2. Tiny central disc protrusion at L5-S1 without stenosis or impingement. Electronically Signed   By: Rise Mu M.D.   On: 12/11/2019 22:35    DG CHEST PORT 1 VIEW   Result  Date: 12/12/2019 CLINICAL DATA:  Increasing weakness EXAM: PORTABLE CHEST 1 VIEW COMPARISON:  None. FINDINGS: The heart size and mediastinal contours are within normal limits. Both lungs are clear. The visualized skeletal structures are unremarkable. IMPRESSION: No active disease. Electronically Signed   By: Marnee Spring M.D.   On: 12/12/2019 06:22         Assessment/Plan: Diagnosis: Questionable MS- initial symptoms/signs 1. Does the need for close, 24 hr/day medical supervision in concert with the patient's rehab needs make it unreasonable for this patient to be served in a less intensive setting? Yes 2. Co-Morbidities requiring supervision/potential complications: autism, neurogenic bladder, increased risk of DVT< elevated BGs on steroids, nystagmus and blurry vision.  3. Due to bladder management, bowel management, safety, skin/wound care, disease management, medication administration and patient education, does the patient require 24 hr/day rehab nursing? Yes 4. Does the patient require coordinated care of a physician, rehab nurse, therapy disciplines of PT, OT and possibly SLP to address physical and functional deficits in the context of the above medical diagnosis(es)? Yes Addressing deficits in the following areas: balance, endurance, locomotion, strength, transferring, bowel/bladder control, bathing, dressing, feeding, grooming, toileting, language and psychosocial support 5. Can the patient actively participate in an intensive therapy program of at least 3 hrs of therapy per day at least 5 days per week? Yes  6. The potential for patient to make measurable gains while on inpatient rehab is good 7. Anticipated functional outcomes upon discharge from inpatient rehab are modified independent and supervision  with PT, modified independent and supervision with OT, n/a with SLP. 8. Estimated rehab length of stay to reach the above functional goals is: 7-10 days 9. Anticipated discharge  destination: Home 10. Overall Rehab/Functional Prognosis: good   RECOMMENDATIONS: This patient's condition is appropriate for continued rehabilitative care in the following setting: CIR Patient has agreed to participate in recommended program. Potentially Note that insurance prior authorization may be required for reimbursement for recommended care.   Comment:  1. Discussed with pt and mother that although MS is a potential dx, and highest on differential, is not clear for sure at this time- definitively.  2. Discussed length of rehab- likely 5-10 days depending on progress with steroids-  3. Will need to get set up with Neurology long term- which they are aware of.  4. Will monitor pt for signs of spasticity.  5. C/o bladder accidents- might need to see Urology outpt.  6. Thank you for this consult- a good CIR candidate once done with steroids.        Mcarthur Rossetti Angiulli, PA-C 12/13/2019    I have personally performed a face to face diagnostic evaluation of this patient and formulated the key components of the plan.  Additionally, I have personally reviewed laboratory data, imaging studies, as well as relevant notes and concur with the physician assistant's documentation above.   The patient's status has not changed from the original consult.  Any changes in documentation from the acute care chart have been noted above.

## 2019-12-17 NOTE — Progress Notes (Signed)
Inpatient Rehabilitation Medication Review by a Pharmacist  A complete drug regimen review was completed for this patient to identify any potential clinically significant medication issues.  Clinically significant medication issues were identified:  no  Name of provider notified: N/A  Method of Notification: N/A  Pharmacist comments: No issues  Time spent performing this drug regimen review (minutes):  5  Alvia Grove, PharmD PGY1 Acute Care Pharmacy Resident 12/17/2019 5:17 PM

## 2019-12-17 NOTE — Discharge Summary (Signed)
Physician Discharge Summary  ELIAN GLOSTER ZDG:[id number] DOB: 06/06/1996 DOA: 12/11/2019  PCP: Patient, No Pcp Per  Admit date: 12/11/2019 Discharge date: 12/17/2019  Admitted From: Home Disposition: CIR  Recommendations for Outpatient Follow-up:  1. Follow up with PCP in 1-2 weeks 2. Follow up with Neurology within 1-2 weeks after D/C from Rehab 3. Please obtain CMP/CBC, Mag, Phos in one week 4. Please follow up on the following pending results:  Home Health: No Equipment/Devices: None  Discharge Condition: Stable CODE STATUS: FULL CODE Diet recommendation:   Brief/Interim Summary: The patient is a 24 year old with a past medical history significant for autism currently not on any medication who started experience weakness and difficulty ambulating over the last 2 months. This is progressively worsened and he had 3 falls while walking his dog and he did hit his head in the process did not lose consciousness. Mother had stated that he had been stumbling while walking and started to walk like a toddler and over the last few weeks patient had blurred vision. He was brought to the emergency room for further evaluation and per the patient's mother he had a prolonged respiratory tract infection2 months ago in January during which she did have a persistent productive cough. He had an MRI of the C-spine,T-spine,and L-spine ordered and showed diffuse demyelinating lesions. Neurology was consulted and recommended the patient be started on Solu-Medrol 1 g daily for 5 days. Lumbar puncture was done as well and patient was transferred to [facility] for further evaluation. Currently he is being evaluated and treated for demyelinating disease of central nervous system with concern for multiple sclerosis versus other CNS lymphomatous process.  Patient is undergoing IV Solu-Medrol for 5 days and the plan is for him to go to CIR after completion of his steroids if Neurology has no further  recommendations.  Yesterday was his last day of steroids and neurology want to repeat his MRI of the brain and thoracic spine before providing further recommendations.  **MRI of the Brain showed "Innumerable scattered white matter lesions are similar in size and number to the prior exam. The lesions are slightly less well-defined than on the prior study. Previously seen enhancement is no longer present. This likely represents some response to the interval therapy.  No new lesions or evidence for ongoing active demyelination"  MRI of the Thoracic Spine showed "Extensive T2 lesions throughout the thoracic spinal cord is slightly less well-defined than on the prior exam.  Previously noted enhancement at the T1-2 level is near completely resolved. This likely reflects response to steroid treatment.  No evidence for disease progression."  Patient is slowly improving and neurology has signed off the case and recommends no further intervention given that he MRI of the brain and thoracic spine show resolution of enhancing brain lesions and near complete resolution of the thoracic spinal cord lesion enhancement.  And recommends the patient be able to go to CIR.  He will need to follow-up with outpatient Dr. 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 -Other differentials includes neuromyelitis optica neurosarcoid and CNS lymphomatous's versus other CNS lymphomatous process. -Patient has been started on IV Solu-Medrol 1 g daily for 5 days; Solu-Medrol dosing now complete on 12/16/2018 -Follow-up  CSF labs and showed a IgG serum of 1343, IgG CSF of 7.2, CSF IgG index of 1.1, and Ig G/albumin  ratio of CSF of 0.33, nonreactive RPR, negative and nonreactive HIV antibodies, RBC count of 58 in the CSF, WBC of 3, with CSF culture showed No Organisms and NGTD at 3 Days and Gram stain showing few WBCs predominantly mononuclear -Cytology of the CSF showed no malignant cells -Checked ACElevels and was 17 -Anti-Jo 1 antibody was less than 0.2, double-stranded DNA antibody was 9, ENA SM antibody serum-aCnc, ribonucleic protein was less than 0.2, SSA Ro and SSB La antibody IgG was less than 0.2 and scleroderma SCL 70 ENA antibody was less than 0.2 -Neurology consulted for further evaluation and recommendations and appreciate further assistance in management -Obtain PT and OT to evaluate and treat they are recommending CIR and we have consulted the rehab admissions coordinator as well as the rehab MD; patient appears to be a good candidate for CIR once he has completed his -Neurology recommends possibly extending Solu-Medrol for 2 more days after the 5-day dosing based on clinical response but will defer to them as he has completed 5 Days already.  I spoke with Dr. Otelia Limes and he recommends repeating the MRI of the brain and MRI of the thoracic spine with contrast -MRI of the Brain showed "Innumerable scattered white matter lesions are similar in size and number to the prior exam. The lesions are slightly less well-defined than on the prior study. Previously seen enhancement is no longer present. This likely represents some response to the interval therapy.  No new lesions or evidence for ongoing active demyelination" -MRI of the Thoracic Spine showed "Extensive T2 lesions throughout the thoracic spinal cord is slightly less well-defined than on the prior exam.  Previously noted enhancement at the T1-2 level is near completely resolved. This likely reflects response to steroid treatment.  No evidence for disease progression." -Considering the response to steroids and MRI results neurology is considering  maybe IVIG or even plasma exchange but they will assess and make recommendations but he has improved and imaging shows near complete resolution of thoracic spinal cord lesion enhancement as well as resolution of the enhancing renal lesions. -They also recommend considering plasma exchange if clinically appropriate -He will need to follow-up with Dr. Luretha Murphy at discharge -Per neurology he may be a candidate for Tysabri or Ocrevus in outpatient setting -Checking a urinalysis and chest x-ray to make sure there is no evidence of infection as he is currently now on steroidsand urinalysis was unremarkable and chest x-ray showed no active disease -We will add Protonix PPI for GI prophylaxis due to high dose steroids to prevent ulceration  -Appreciate further neurological assistance and evaluation recommendations -Neuro has cleared the patient and signed off the case   History ofAutism -Presently not on any medication. -Is High Functioning   Hypokalemia, improved  -His potassium level today is 4.0 -Continue to monitor and replete as necessary  -Repeat CMP within 1 week   Leukocytosis -Likely in the setting of steroid demargination as WBC went from 10-> 12.7 -> 25.9 -> 25.3 -> 23.9 -> 19.9 -> 14.6 -Continue monitor and trend and Monitor for S/Sx of Infection  -Repeat CBC within 1 week  Hyperglycemia -In the setting of steroid demargination -Patient's blood sugar on admission was normal at 89 on his BMP and repeat CMP this AM showed a blood glucose of 94 -Continue to monitor and trend blood sugars carefully and if necessary will  place on symptoms NovoLog sliding scale insulin AC -CBG was 146 on random check  -Checked HbA1c and was 5.3   Hyperphosphatemia -Mild as patient's Phos Level went from 3.7 -> 4.9 -> 3.1 -> 4.4 -> 3.9 -Continue to Monitor and Trend  -Repeat Phos Level in AM   Sinus Bradycardia -Transient and patient is asymptomatic as he is sleeping -Heart  rates improved when he is awakened  Discharge Instructions Discharge Instructions    Call MD for:  difficulty breathing, headache or visual disturbances   Complete by: As directed    Call MD for:  extreme fatigue   Complete by: As directed    Call MD for:  hives   Complete by: As directed    Call MD for:  persistant dizziness or light-headedness   Complete by: As directed    Call MD for:  persistant nausea and vomiting   Complete by: As directed    Call MD for:  redness, tenderness, or signs of infection (pain, swelling, redness, odor or green/yellow discharge around incision site)   Complete by: As directed    Call MD for:  severe uncontrolled pain   Complete by: As directed    Call MD for:  temperature >100.4   Complete by: As directed    Diet general   Complete by: As directed    Discharge instructions   Complete by: As directed    You were cared for by a hospitalist during your hospital stay. If you have any questions about your discharge medications or the care you received while you were in the hospital after you are discharged, you can call the unit and ask to speak with the hospitalist on call if the hospitalist that took care of you is not available. Once you are discharged, your primary care physician will handle any further medical issues. Please note that NO REFILLS for any discharge medications will be authorized once you are discharged, as it is imperative that you return to your primary care physician (or establish a relationship with a primary care physician if you do not have one) for your aftercare needs so that they can reassess your need for medications and monitor your lab values.  Follow up with PCP and Neurology Dr. Epimenio Foot within 1-2 weeks after discharge. Take all medications as prescribed. If symptoms change or worsen please return to the ED for evaluation   Increase activity slowly   Complete by: As directed      Allergies as of 12/17/2019   No Known  Allergies     Medication List    You have not been prescribed any medications.    Follow-up Information    [facility] Follow up.   Why: Case Manager will call with appointment time  Contact information: [address] [place] [address] [phone]         No Known Allergies  Consultations:  Neurology  Procedures/Studies: MR BRAIN W WO CONTRAST  Result Date: 12/16/2019 CLINICAL DATA:  Demyelinating disease. Abnormal MRI of the head 12/11/2019. Interval treatment with IV steroid. EXAM: MRI HEAD WITHOUT AND WITH CONTRAST TECHNIQUE: Multiplanar, multiecho pulse sequences of the brain and surrounding structures were obtained without and with intravenous contrast. CONTRAST:  5.2mL GADAVIST GADOBUTROL 1 MMOL/ML IV SOLN COMPARISON:  MR the head without and with contrast 12/11/2019 FINDINGS: Brain: Numerous periventricular and subcortical T2 hyperintensities bilaterally are slightly less well defined than on the prior exam. No new lesions or expansion are  seen. Diffusion signal is mostly secondary to shine through. No significant restricted diffusion is present. Postcontrast imaging demonstrates no residual enhancement of the numerous T2 hyperintense lesions. The ventricles are of normal size. No significant extraaxial fluid collection is present. The internal auditory canals are within normal limits. Vascular: Flow is present in the major intracranial arteries. Skull and upper cervical spine: The craniocervical junction is normal. Upper cervical spine is within normal limits. Marrow signal is unremarkable. Sinuses/Orbits: The paranasal sinuses and mastoid air cells are clear. The globes and orbits are within normal limits. IMPRESSION: 1. Innumerable scattered white matter lesions are similar in size and number to the prior exam. The lesions are slightly less well-defined than on the prior study. 2. Previously seen enhancement is no longer  present. This likely represents some response to the interval therapy. 3. No new lesions or evidence for ongoing active demyelination. Electronically Signed   By: Marin Roberts M.D.   On: 12/16/2019 19:33   MR Brain W and Wo Contrast  Result Date: 12/11/2019 CLINICAL DATA:  Initial evaluation for progressive ataxia, concern for stroke, multiple sclerosis. EXAM: MRI HEAD WITHOUT AND WITH CONTRAST MRI CERVICAL, THORACIC, AND LUMBAR SPINE WITHOUT AND WITH CONTRAST TECHNIQUE: Multisequence MR imaging of the spine from the cervical spine to the sacrum was performed prior to and following IV contrast administration for evaluation of spinal metastatic disease. CONTRAST:  5mL GADAVIST GADOBUTROL 1 MMOL/ML IV SOLN COMPARISON:  None available. FINDINGS: MRI HEAD SPINE FINDINGS Brain: Cerebral volume within normal limits for age. Innumerable scattered foci of T2/FLAIR hyperintensity seen involving the periventricular, deep, and juxta cortical white matter of both cerebral hemispheres. Many of these foci are oriented perpendicular to the lateral ventricles. Extensive involvement of the deep gray nuclei, brainstem, and cerebellar peduncles. Associated multiple scattered T1 black holes. For reference purposes, largest focus seen at the posterior left centrum semi ovale and measures approximately 2 cm. 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 Extensive enhancement seen about multiple lesions following contrast administration, compatible with active demyelination. No evidence for acute or subacute infarct. No encephalomalacia to suggest chronic infarction. No evidence for acute or chronic intracranial hemorrhage. No mass lesion or mass effect. No hydrocephalus. No extra-axial fluid collection. Pituitary gland suprasellar region normal. Midline structures intact. Vascular: Major intracranial vascular flow voids are maintained. Skull and upper cervical spine: Craniocervical junction normal. Bone  marrow signal intensity within normal limits. No scalp soft tissue abnormality. Sinuses/Orbits: Globes and orbital soft tissues within normal limits. Scattered mucosal thickening noted throughout the paranasal sinuses, with small volume layering fluid within the lateral recess of the left sphenoid sinus. No mastoid effusion. Inner ear structures grossly normal. Other: None. MRI CERVICAL SPINE FINDINGS Alignment: Straightening of the normal cervical lordosis. No listhesis. Vertebrae: Vertebral body height maintained without evidence for acute or chronic fracture. Bone marrow signal intensity normal. No discrete or worrisome osseous lesions. No abnormal marrow edema or enhancement. Cord: Extensive signal abnormality seen involving the majority of the cervical spine, extending from the cervicomedullary junction through the upper thoracic spine, compatible with demyelinating disease/multiple sclerosis. For reference purposes, most prominent of these lesions involves the right dorsal cord at the level of C2 (series 7, image 3). No definite associated enhancement to suggest active demyelination, although evaluation somewhat limited by motion artifact. Underlying cord is somewhat atrophic in appearance. Posterior Fossa, vertebral arteries, paraspinal tissues: Unremarkable. Disc levels: No significant disc pathology seen within the cervical spine. No significant facet degeneration. No canal or neural foraminal stenosis.  No impingement. MRI THORACIC SPINE FINDINGS Alignment: Physiologic with preservation of the normal thoracic kyphosis. No listhesis. Vertebrae: Vertebral body height maintained without evidence for acute or chronic fracture. Bone marrow signal intensity within normal limits. No discrete or worrisome osseous lesions. No abnormal marrow edema or enhancement. Cord: Extensive signal abnormality seen throughout the thoracic spinal cord, essentially involving nearly all levels. Finding consistent with demyelinating  disease. Patchy postcontrast enhancement involving the cord at the level of T1-2 consistent with active demyelination (series 32, image 8). Suspected additional faint patchy post-contrast enhancement at the level of T10-11 (series 32, image 8). Underlying cord is mildly atrophic in appearance. Paraspinal and other soft tissues: Unremarkable. Disc levels: T6-7: Small central disc protrusion indents the ventral thecal sac, contacting and minimally flattening the ventral cord. No significant stenosis. No other significant disc pathology seen within the thoracic spine. No other stenosis or neural impingement. MRI LUMBAR SPINE FINDINGS Segmentation:  Standard. Alignment:  Physiologic. Vertebrae: Vertebral body height maintained without evidence for acute or chronic fracture. Bone marrow signal intensity normal. No discrete or worrisome osseous lesions. No abnormal marrow edema or enhancement. Conus medullaris: Extends to the L1-2 level. Patchy signal abnormality seen within the distal cord, consistent with demyelinating disease. Finding better evaluated on concomitant thoracic spine MRI. No appreciable abnormal enhancement. Nerve roots of the cauda equina within normal limits. Paraspinal and other soft tissues: Unremarkable. Disc levels: L1-2:  Unremarkable. L2-3:  Unremarkable. L3-4:  Unremarkable. L4-5:  Unremarkable. L5-S1: Tiny central disc protrusion with slight inferior migration. No significant spinal stenosis or neural impingement. Foramina remain patent. IMPRESSION: MRI HEAD IMPRESSION: Extensive T2/FLAIR signal abnormality scattered throughout the supratentorial and infratentorial brain, consistent with demyelinating disease/multiple sclerosis. Associated enhancement about numerous lesions, consistent with active demyelination. MRI CERVICAL SPINE IMPRESSION: Extensive signal abnormality involving essentially the majority of the cervical spinal cord, consistent with demyelinating disease. No abnormal enhancement  to suggest active demyelination. MRI THORACIC SPINE IMPRESSION: 1. Extensive signal abnormality involving essentially the entirety of the thoracic spinal cord, consistent with demyelinating disease. Patchy postcontrast enhancement at the level of T1-2 and T10-11 consistent with active demyelination. 2. Small central disc protrusion at T6-7 without significant stenosis. MRI LUMBAR SPINE IMPRESSION: 1. Patchy signal abnormality within the visualized distal cord, consistent with demyelinating disease. No abnormal enhancement to suggest active demyelination. 2. Tiny central disc protrusion at L5-S1 without stenosis or impingement. Electronically Signed   By: Rise Mu M.D.   On: 12/11/2019 22:35   MR CERVICAL SPINE W WO CONTRAST  Result Date: 12/11/2019 CLINICAL DATA:  Initial evaluation for progressive ataxia, concern for stroke, multiple sclerosis. EXAM: MRI HEAD WITHOUT AND WITH CONTRAST MRI CERVICAL, THORACIC, AND LUMBAR SPINE WITHOUT AND WITH CONTRAST TECHNIQUE: Multisequence MR imaging of the spine from the cervical spine to the sacrum was performed prior to and following IV contrast administration for evaluation of spinal metastatic disease. CONTRAST:  34mL GADAVIST GADOBUTROL 1 MMOL/ML IV SOLN COMPARISON:  None available. FINDINGS: MRI HEAD SPINE FINDINGS Brain: Cerebral volume within normal limits for age. Innumerable scattered foci of T2/FLAIR hyperintensity seen involving the periventricular, deep, and juxta cortical white matter of both cerebral hemispheres. Many of these foci are oriented perpendicular to the lateral ventricles. Extensive involvement of the deep gray nuclei, brainstem, and cerebellar peduncles. Associated multiple scattered T1 black holes. For reference purposes, largest focus seen at the posterior left centrum semi ovale and measures approximately 2 cm. 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 Extensive enhancement seen about multiple lesions  following  contrast administration, compatible with active demyelination. No evidence for acute or subacute infarct. No encephalomalacia to suggest chronic infarction. No evidence for acute or chronic intracranial hemorrhage. No mass lesion or mass effect. No hydrocephalus. No extra-axial fluid collection. Pituitary gland suprasellar region normal. Midline structures intact. Vascular: Major intracranial vascular flow voids are maintained. Skull and upper cervical spine: Craniocervical junction normal. Bone marrow signal intensity within normal limits. No scalp soft tissue abnormality. Sinuses/Orbits: Globes and orbital soft tissues within normal limits. Scattered mucosal thickening noted throughout the paranasal sinuses, with small volume layering fluid within the lateral recess of the left sphenoid sinus. No mastoid effusion. Inner ear structures grossly normal. Other: None. MRI CERVICAL SPINE FINDINGS Alignment: Straightening of the normal cervical lordosis. No listhesis. Vertebrae: Vertebral body height maintained without evidence for acute or chronic fracture. Bone marrow signal intensity normal. No discrete or worrisome osseous lesions. No abnormal marrow edema or enhancement. Cord: Extensive signal abnormality seen involving the majority of the cervical spine, extending from the cervicomedullary junction through the upper thoracic spine, compatible with demyelinating disease/multiple sclerosis. For reference purposes, most prominent of these lesions involves the right dorsal cord at the level of C2 (series 7, image 3). No definite associated enhancement to suggest active demyelination, although evaluation somewhat limited by motion artifact. Underlying cord is somewhat atrophic in appearance. Posterior Fossa, vertebral arteries, paraspinal tissues: Unremarkable. Disc levels: No significant disc pathology seen within the cervical spine. No significant facet degeneration. No canal or neural foraminal stenosis. No  impingement. MRI THORACIC SPINE FINDINGS Alignment: Physiologic with preservation of the normal thoracic kyphosis. No listhesis. Vertebrae: Vertebral body height maintained without evidence for acute or chronic fracture. Bone marrow signal intensity within normal limits. No discrete or worrisome osseous lesions. No abnormal marrow edema or enhancement. Cord: Extensive signal abnormality seen throughout the thoracic spinal cord, essentially involving nearly all levels. Finding consistent with demyelinating disease. Patchy postcontrast enhancement involving the cord at the level of T1-2 consistent with active demyelination (series 32, image 8). Suspected additional faint patchy post-contrast enhancement at the level of T10-11 (series 32, image 8). Underlying cord is mildly atrophic in appearance. Paraspinal and other soft tissues: Unremarkable. Disc levels: T6-7: Small central disc protrusion indents the ventral thecal sac, contacting and minimally flattening the ventral cord. No significant stenosis. No other significant disc pathology seen within the thoracic spine. No other stenosis or neural impingement. MRI LUMBAR SPINE FINDINGS Segmentation:  Standard. Alignment:  Physiologic. Vertebrae: Vertebral body height maintained without evidence for acute or chronic fracture. Bone marrow signal intensity normal. No discrete or worrisome osseous lesions. No abnormal marrow edema or enhancement. Conus medullaris: Extends to the L1-2 level. Patchy signal abnormality seen within the distal cord, consistent with demyelinating disease. Finding better evaluated on concomitant thoracic spine MRI. No appreciable abnormal enhancement. Nerve roots of the cauda equina within normal limits. Paraspinal and other soft tissues: Unremarkable. Disc levels: L1-2:  Unremarkable. L2-3:  Unremarkable. L3-4:  Unremarkable. L4-5:  Unremarkable. L5-S1: Tiny central disc protrusion with slight inferior migration. No significant spinal stenosis or  neural impingement. Foramina remain patent. IMPRESSION: MRI HEAD IMPRESSION: Extensive T2/FLAIR signal abnormality scattered throughout the supratentorial and infratentorial brain, consistent with demyelinating disease/multiple sclerosis. Associated enhancement about numerous lesions, consistent with active demyelination. MRI CERVICAL SPINE IMPRESSION: Extensive signal abnormality involving essentially the majority of the cervical spinal cord, consistent with demyelinating disease. No abnormal enhancement to suggest active demyelination. MRI THORACIC SPINE IMPRESSION: 1. Extensive signal abnormality involving essentially the entirety of the  thoracic spinal cord, consistent with demyelinating disease. Patchy postcontrast enhancement at the level of T1-2 and T10-11 consistent with active demyelination. 2. Small central disc protrusion at T6-7 without significant stenosis. MRI LUMBAR SPINE IMPRESSION: 1. Patchy signal abnormality within the visualized distal cord, consistent with demyelinating disease. No abnormal enhancement to suggest active demyelination. 2. Tiny central disc protrusion at L5-S1 without stenosis or impingement. Electronically Signed   By: Rise Mu M.D.   On: 12/11/2019 22:35   MR THORACIC SPINE W WO CONTRAST  Result Date: 12/16/2019 CLINICAL DATA:  Diffuse demyelination. Follow-up after steroid therapy. EXAM: MRI THORACIC WITHOUT AND WITH CONTRAST TECHNIQUE: Multiplanar and multiecho pulse sequences of the thoracic spine were obtained without and with intravenous contrast. CONTRAST:  5.8mL GADAVIST GADOBUTROL 1 MMOL/ML IV SOLN COMPARISON:  MRI of the thoracic spine 12/11/19 FINDINGS: MRI THORACIC SPINE FINDINGS Alignment:  No significant listhesis is present. Vertebrae: Marrow signal and vertebral body heights are normal. Cord: Faint enhancement on the right at T1-2 is near completely resolved. No enhancement remains in the lower thoracic spine. Numerous T2 hyperintensities within  the cord slightly less well defined than on the prior exam. No new lesions are present. The cord is not expanded. Paraspinal and other soft tissues: Paraspinous soft tissues are within limits. Lungs are clear. Disc levels: Focal disc disease at T4-5 and T6-7 is again noted. The central protrusion at T6-7 contacts and slightly distorts the ventral surface the cord. Foramina are patent bilaterally. IMPRESSION: 1. Extensive T2 lesions throughout the thoracic spinal cord is slightly less well-defined than on the prior exam. 2. Previously noted enhancement at the T1-2 level is near completely resolved. This likely reflects response to steroid treatment. 3. No evidence for disease progression. Electronically Signed   By: Marin Roberts M.D.   On: 12/16/2019 19:39   MR THORACIC SPINE W WO CONTRAST  Result Date: 12/11/2019 CLINICAL DATA:  Initial evaluation for progressive ataxia, concern for stroke, multiple sclerosis. EXAM: MRI HEAD WITHOUT AND WITH CONTRAST MRI CERVICAL, THORACIC, AND LUMBAR SPINE WITHOUT AND WITH CONTRAST TECHNIQUE: Multisequence MR imaging of the spine from the cervical spine to the sacrum was performed prior to and following IV contrast administration for evaluation of spinal metastatic disease. CONTRAST:  5mL GADAVIST GADOBUTROL 1 MMOL/ML IV SOLN COMPARISON:  None available. FINDINGS: MRI HEAD SPINE FINDINGS Brain: Cerebral volume within normal limits for age. Innumerable scattered foci of T2/FLAIR hyperintensity seen involving the periventricular, deep, and juxta cortical white matter of both cerebral hemispheres. Many of these foci are oriented perpendicular to the lateral ventricles. Extensive involvement of the deep gray nuclei, brainstem, and cerebellar peduncles. Associated multiple scattered T1 black holes. For reference purposes, largest focus seen at the posterior left centrum semi ovale and measures approximately 2 cm. Findings consistent with demyelinating disease/multiple  sclerosis. Extensive enhancement seen about multiple lesions following contrast administration, compatible with active demyelination. No evidence for acute or subacute infarct. No encephalomalacia to suggest chronic infarction. No evidence for acute or chronic intracranial hemorrhage. No mass lesion or mass effect. No hydrocephalus. No extra-axial fluid collection. Pituitary gland suprasellar region normal. Midline structures intact. Vascular: Major intracranial vascular flow voids are maintained. Skull and upper cervical spine: Craniocervical junction normal. Bone marrow signal intensity within normal limits. No scalp soft tissue abnormality. Sinuses/Orbits: Globes and orbital soft tissues within normal limits. Scattered mucosal thickening noted throughout the paranasal sinuses, with small volume layering fluid within the lateral recess of the left sphenoid sinus. No mastoid effusion. Inner ear structures  grossly normal. Other: None. MRI CERVICAL SPINE FINDINGS Alignment: Straightening of the normal cervical lordosis. No listhesis. Vertebrae: Vertebral body height maintained without evidence for acute or chronic fracture. Bone marrow signal intensity normal. No discrete or worrisome osseous lesions. No abnormal marrow edema or enhancement. Cord: Extensive signal abnormality seen involving the majority of the cervical spine, extending from the cervicomedullary junction through the upper thoracic spine, compatible with demyelinating disease/multiple sclerosis. For reference purposes, most prominent of these lesions involves the right dorsal cord at the level of C2 (series 7, image 3). No definite associated enhancement to suggest active demyelination, although evaluation somewhat limited by motion artifact. Underlying cord is somewhat atrophic in appearance. Posterior Fossa, vertebral arteries, paraspinal tissues: Unremarkable. Disc levels: No significant disc pathology seen within the cervical spine. No significant  facet degeneration. No canal or neural foraminal stenosis. No impingement. MRI THORACIC SPINE FINDINGS Alignment: Physiologic with preservation of the normal thoracic kyphosis. No listhesis. Vertebrae: Vertebral body height maintained without evidence for acute or chronic fracture. Bone marrow signal intensity within normal limits. No discrete or worrisome osseous lesions. No abnormal marrow edema or enhancement. Cord: Extensive signal abnormality seen throughout the thoracic spinal cord, essentially involving nearly all levels. Finding consistent with demyelinating disease. Patchy postcontrast enhancement involving the cord at the level of T1-2 consistent with active demyelination (series 32, image 8). Suspected additional faint patchy post-contrast enhancement at the level of T10-11 (series 32, image 8). Underlying cord is mildly atrophic in appearance. Paraspinal and other soft tissues: Unremarkable. Disc levels: T6-7: Small central disc protrusion indents the ventral thecal sac, contacting and minimally flattening the ventral cord. No significant stenosis. No other significant disc pathology seen within the thoracic spine. No other stenosis or neural impingement. MRI LUMBAR SPINE FINDINGS Segmentation:  Standard. Alignment:  Physiologic. Vertebrae: Vertebral body height maintained without evidence for acute or chronic fracture. Bone marrow signal intensity normal. No discrete or worrisome osseous lesions. No abnormal marrow edema or enhancement. Conus medullaris: Extends to the L1-2 level. Patchy signal abnormality seen within the distal cord, consistent with demyelinating disease. Finding better evaluated on concomitant thoracic spine MRI. No appreciable abnormal enhancement. Nerve roots of the cauda equina within normal limits. Paraspinal and other soft tissues: Unremarkable. Disc levels: L1-2:  Unremarkable. L2-3:  Unremarkable. L3-4:  Unremarkable. L4-5:  Unremarkable. L5-S1: Tiny central disc protrusion  with slight inferior migration. No significant spinal stenosis or neural impingement. Foramina remain patent. IMPRESSION: MRI HEAD IMPRESSION: Extensive T2/FLAIR signal abnormality scattered throughout the supratentorial and infratentorial brain, consistent with demyelinating disease/multiple sclerosis. Associated enhancement about numerous lesions, consistent with active demyelination. MRI CERVICAL SPINE IMPRESSION: Extensive signal abnormality involving essentially the majority of the cervical spinal cord, consistent with demyelinating disease. No abnormal enhancement to suggest active demyelination. MRI THORACIC SPINE IMPRESSION: 1. Extensive signal abnormality involving essentially the entirety of the thoracic spinal cord, consistent with demyelinating disease. Patchy postcontrast enhancement at the level of T1-2 and T10-11 consistent with active demyelination. 2. Small central disc protrusion at T6-7 without significant stenosis. MRI LUMBAR SPINE IMPRESSION: 1. Patchy signal abnormality within the visualized distal cord, consistent with demyelinating disease. No abnormal enhancement to suggest active demyelination. 2. Tiny central disc protrusion at L5-S1 without stenosis or impingement. Electronically Signed   By: Jeannine Boga M.D.   On: 12/11/2019 22:35   MR Lumbar Spine W Wo Contrast  Result Date: 12/11/2019 CLINICAL DATA:  Initial evaluation for progressive ataxia, concern for stroke, multiple sclerosis. EXAM: MRI HEAD WITHOUT AND WITH CONTRAST  MRI CERVICAL, THORACIC, AND LUMBAR SPINE WITHOUT AND WITH CONTRAST TECHNIQUE: Multisequence MR imaging of the spine from the cervical spine to the sacrum was performed prior to and following IV contrast administration for evaluation of spinal metastatic disease. CONTRAST:  5mL GADAVIST GADOBUTROL 1 MMOL/ML IV SOLN COMPARISON:  None available. FINDINGS: MRI HEAD SPINE FINDINGS Brain: Cerebral volume within normal limits for age. Innumerable scattered foci  of T2/FLAIR hyperintensity seen involving the periventricular, deep, and juxta cortical white matter of both cerebral hemispheres. Many of these foci are oriented perpendicular to the lateral ventricles. Extensive involvement of the deep gray nuclei, brainstem, and cerebellar peduncles. Associated multiple scattered T1 black holes. For reference purposes, largest focus seen at the posterior left centrum semi ovale and measures approximately 2 cm. 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 Extensive enhancement seen about multiple lesions following contrast administration, compatible with active demyelination. No evidence for acute or subacute infarct. No encephalomalacia to suggest chronic infarction. No evidence for acute or chronic intracranial hemorrhage. No mass lesion or mass effect. No hydrocephalus. No extra-axial fluid collection. Pituitary gland suprasellar region normal. Midline structures intact. Vascular: Major intracranial vascular flow voids are maintained. Skull and upper cervical spine: Craniocervical junction normal. Bone marrow signal intensity within normal limits. No scalp soft tissue abnormality. Sinuses/Orbits: Globes and orbital soft tissues within normal limits. Scattered mucosal thickening noted throughout the paranasal sinuses, with small volume layering fluid within the lateral recess of the left sphenoid sinus. No mastoid effusion. Inner ear structures grossly normal. Other: None. MRI CERVICAL SPINE FINDINGS Alignment: Straightening of the normal cervical lordosis. No listhesis. Vertebrae: Vertebral body height maintained without evidence for acute or chronic fracture. Bone marrow signal intensity normal. No discrete or worrisome osseous lesions. No abnormal marrow edema or enhancement. Cord: Extensive signal abnormality seen involving the majority of the cervical spine, extending from the cervicomedullary junction through the upper thoracic spine, compatible with  demyelinating disease/multiple sclerosis. For reference purposes, most prominent of these lesions involves the right dorsal cord at the level of C2 (series 7, image 3). No definite associated enhancement to suggest active demyelination, although evaluation somewhat limited by motion artifact. Underlying cord is somewhat atrophic in appearance. Posterior Fossa, vertebral arteries, paraspinal tissues: Unremarkable. Disc levels: No significant disc pathology seen within the cervical spine. No significant facet degeneration. No canal or neural foraminal stenosis. No impingement. MRI THORACIC SPINE FINDINGS Alignment: Physiologic with preservation of the normal thoracic kyphosis. No listhesis. Vertebrae: Vertebral body height maintained without evidence for acute or chronic fracture. Bone marrow signal intensity within normal limits. No discrete or worrisome osseous lesions. No abnormal marrow edema or enhancement. Cord: Extensive signal abnormality seen throughout the thoracic spinal cord, essentially involving nearly all levels. Finding consistent with demyelinating disease. Patchy postcontrast enhancement involving the cord at the level of T1-2 consistent with active demyelination (series 32, image 8). Suspected additional faint patchy post-contrast enhancement at the level of T10-11 (series 32, image 8). Underlying cord is mildly atrophic in appearance. Paraspinal and other soft tissues: Unremarkable. Disc levels: T6-7: Small central disc protrusion indents the ventral thecal sac, contacting and minimally flattening the ventral cord. No significant stenosis. No other significant disc pathology seen within the thoracic spine. No other stenosis or neural impingement. MRI LUMBAR SPINE FINDINGS Segmentation:  Standard. Alignment:  Physiologic. Vertebrae: Vertebral body height maintained without evidence for acute or chronic fracture. Bone marrow signal intensity normal. No discrete or worrisome osseous lesions. No  abnormal marrow edema or enhancement. Conus medullaris: Extends to  the L1-2 level. Patchy signal abnormality seen within the distal cord, consistent with demyelinating disease. Finding better evaluated on concomitant thoracic spine MRI. No appreciable abnormal enhancement. Nerve roots of the cauda equina within normal limits. Paraspinal and other soft tissues: Unremarkable. Disc levels: L1-2:  Unremarkable. L2-3:  Unremarkable. L3-4:  Unremarkable. L4-5:  Unremarkable. L5-S1: Tiny central disc protrusion with slight inferior migration. No significant spinal stenosis or neural impingement. Foramina remain patent. IMPRESSION: MRI HEAD IMPRESSION: Extensive T2/FLAIR signal abnormality scattered throughout the supratentorial and infratentorial brain, consistent with demyelinating disease/multiple sclerosis. Associated enhancement about numerous lesions, consistent with active demyelination. MRI CERVICAL SPINE IMPRESSION: Extensive signal abnormality involving essentially the majority of the cervical spinal cord, consistent with demyelinating disease. No abnormal enhancement to suggest active demyelination. MRI THORACIC SPINE IMPRESSION: 1. Extensive signal abnormality involving essentially the entirety of the thoracic spinal cord, consistent with demyelinating disease. Patchy postcontrast enhancement at the level of T1-2 and T10-11 consistent with active demyelination. 2. Small central disc protrusion at T6-7 without significant stenosis. MRI LUMBAR SPINE IMPRESSION: 1. Patchy signal abnormality within the visualized distal cord, consistent with demyelinating disease. No abnormal enhancement to suggest active demyelination. 2. Tiny central disc protrusion at L5-S1 without stenosis or impingement. Electronically Signed   By: Rise Mu M.D.   On: 12/11/2019 22:35   DG CHEST PORT 1 VIEW  Result Date: 12/12/2019 CLINICAL DATA:  Increasing weakness EXAM: PORTABLE CHEST 1 VIEW COMPARISON:  None. FINDINGS: The  heart size and mediastinal contours are within normal limits. Both lungs are clear. The visualized skeletal structures are unremarkable. IMPRESSION: No active disease. Electronically Signed   By: Marnee Spring M.D.   On: 12/12/2019 06:22    Subjective: Seen and examined at bedside and the patient was seen in the chair eating.  Denied pain nausea or vomiting.  Felt okay.  No other concerns or complaints at this time is ready to go to rehab.  I called the patient's mother and updated her and answered all her questions and she was appreciated of care.  No other concerns or questions at this time.  Discharge Exam: Vitals:   12/17/19 0730 12/17/19 1141  BP: (!) 113/58 (!) 95/59  Pulse: (!) 50 90  Resp: 20 18  Temp: 98 F (36.7 C) 98.2 F (36.8 C)  SpO2: 99% 93%   Vitals:   12/17/19 0410 12/17/19 0537 12/17/19 0730 12/17/19 1141  BP: 119/65  (!) 113/58 (!) 95/59  Pulse: (!) 49  (!) 50 90  Resp:  Temp: 98 F (36.7 C)  98 F (36.7 C) 98.2 F (36.8 C)  TempSrc:      SpO2:   99% 93%  Weight:      Height:       General: Pt is a thin Male who is alert, awake, not in acute distress Cardiovascular: Mildly tachycardic, S1/S2 +, no rubs, no gallops Respiratory: Diminished bilaterally, no wheezing, no rhonchi Abdominal: Soft, NT, ND, bowel sounds + Extremities: no edema, no cyanosis  The results of significant diagnostics from this hospitalization (including imaging, microbiology, ancillary and laboratory) are listed below for reference.    Microbiology: Recent Results (from the past 240 hour(s))  SARS CORONAVIRUS 2 (TAT 6-24 HRS) Nasopharyngeal Nasopharyngeal Swab     Status: None   Collection Time: 12/12/19 12:02 AM   Specimen: Nasopharyngeal Swab  Result Value Ref Range Status   SARS Coronavirus 2 NEGATIVE NEGATIVE Final    Comment: (NOTE) SARS-CoV-2 target nucleic acids are  NOT DETECTED. The SARS-CoV-2 RNA is generally detectable in upper and lower respiratory specimens  during the acute phase of infection. Negative results do not preclude SARS-CoV-2 infection, do not rule out co-infections with other pathogens, and should not be used as the sole basis for treatment or other patient management decisions. Negative results must be combined with clinical observations, patient history, and epidemiological information. The expected result is Negative. Fact Sheet for Patients: HairSlick.no Fact Sheet for Healthcare Providers: quierodirigir.com This test is not yet approved or cleared by the [place] FDA and  has been authorized for detection and/or diagnosis of SARS-CoV-2 by FDA under an Emergency Use Authorization (EUA). This EUA will remain  in effect (meaning this test can be used) for the duration of the COVID-19 declaration under Section 56 4(b)(1) of the Act, 21 U.S.C. section 360bbb-3(b)(1), unless the authorization is terminated or revoked sooner. Performed at [facility], [address] [place], [place] [address]   CSF culture     Status: None   Collection Time: 12/12/19 12:02 AM   Specimen: CSF; Cerebrospinal Fluid  Result Value Ref Range Status   Specimen Description CSF  Final   Special Requests   Final    NONE Performed at [facility], [address] [place], [place] [address]    Gram Stain   Final    FEW WBC PRESENT, PREDOMINANTLY MONONUCLEAR NO ORGANISMS SEEN RESULT CALLED TO, READ BACK BY AND VERIFIED WITH: RN KATIE AT 0132 12/12/19 CRUICKSHANK A Performed at [facility], [address] [place], [place] [address]    Culture   Final    NO GROWTH 3 DAYS Performed at [facility], [address], [place], [place] [address]    Report Status 12/15/2019 FINAL  Final  MRSA PCR Screening     Status: None   Collection Time: 12/12/19  9:52 AM   Specimen: Nasal Mucosa; Nasopharyngeal  Result Value Ref Range Status   MRSA by PCR  NEGATIVE NEGATIVE Final    Comment:        The GeneXpert MRSA Assay (FDA approved for NASAL specimens only), is one component of a comprehensive MRSA colonization surveillance program. It is not intended to diagnose MRSA infection nor to guide or monitor treatment for MRSA infections. Performed at [facility], [address], [place], [place] [address]     Labs: BNP (last 3 results) No results for input(s): BNP in the last 8760 hours. Basic Metabolic Panel: Recent Labs  Lab 12/13/19 0944 12/14/19 0619 12/15/19 0939 12/16/19 0540 12/17/19 0420  NA 140 141 140 138 139  K 4.1 4.0 3.7 4.0 3.9  CL 105 103 104 106 100  CO2 23 25 26 27 27   GLUCOSE 145* 120* 122* 112* 94  BUN 16 12 13 14 15   CREATININE 1.08 0.85 0.88 0.87 1.00  CALCIUM 9.5 9.6 9.1 8.8* 8.7*  MG 1.8 1.8 2.0 2.1 2.2  PHOS 3.7 4.9* 3.1 4.4 4.2   Liver Function Tests: Recent Labs  Lab 12/13/19 0944 12/14/19 0619 12/15/19 0939 12/16/19 0540 12/17/19 0420  AST 21 16 17 19 18   ALT 15 13 13 14 15   ALKPHOS 44 38 39 34* 37*  BILITOT 1.1 0.9 0.6 0.5 0.4  PROT 7.1 6.2* 6.5 5.8* 5.9*  ALBUMIN 4.3 3.7 3.8 3.3* 3.3*   No results for input(s): LIPASE, AMYLASE in the last 168 hours. No results for input(s): AMMONIA in the last 168 hours. CBC:  Recent Labs  Lab 12/13/19 0944 12/14/19 0619 12/15/19 0939 12/16/19 0540 12/17/19 0420  WBC 25.9* 25.3* 23.9* 19.9* 14.6*  NEUTROABS 24.3* 22.7* 21.5* 17.9* 11.6*  HGB 15.4 14.1 14.1 13.5 14.2  HCT 44.8 40.8 40.7 39.2 40.8  MCV 91.4 91.1 91.9 91.4 90.7  PLT 249 239 230 201 211   Cardiac Enzymes: No results for input(s): CKTOTAL, CKMB, CKMBINDEX, TROPONINI in the last 168 hours. BNP: Invalid input(s): POCBNP CBG: Recent Labs  Lab 12/12/19 2131  GLUCAP 146*   D-Dimer No results for input(s): DDIMER in the last 72 hours. Hgb A1c No results for input(s): HGBA1C in the last 72 hours. Lipid Profile No results for input(s): CHOL, HDL, LDLCALC, TRIG,  CHOLHDL, LDLDIRECT in the last 72 hours. Thyroid function studies No results for input(s): TSH, T4TOTAL, T3FREE, THYROIDAB in the last 72 hours.  Invalid input(s): FREET3 Anemia work up No results for input(s): VITAMINB12, FOLATE, FERRITIN, TIBC, IRON, RETICCTPCT in the last 72 hours. Urinalysis    Component Value Date/Time   COLORURINE YELLOW 12/12/2019 0605   APPEARANCEUR CLEAR 12/12/2019 0605   LABSPEC 1.009 12/12/2019 0605   PHURINE 6.0 12/12/2019 0605   GLUCOSEU NEGATIVE 12/12/2019 0605   HGBUR NEGATIVE 12/12/2019 0605   BILIRUBINUR NEGATIVE 12/12/2019 0605   KETONESUR 5 (A) 12/12/2019 0605   PROTEINUR NEGATIVE 12/12/2019 0605   NITRITE NEGATIVE 12/12/2019 0605   LEUKOCYTESUR NEGATIVE 12/12/2019 0605   Sepsis Labs Invalid input(s): PROCALCITONIN,  WBC,  LACTICIDVEN Microbiology Recent Results (from the past 240 hour(s))  SARS CORONAVIRUS 2 (TAT 6-24 HRS) Nasopharyngeal Nasopharyngeal Swab     Status: None   Collection Time: 12/12/19 12:02 AM   Specimen: Nasopharyngeal Swab  Result Value Ref Range Status   SARS Coronavirus 2 NEGATIVE NEGATIVE Final    Comment: (NOTE) SARS-CoV-2 target nucleic acids are NOT DETECTED. The SARS-CoV-2 RNA is generally detectable in upper and lower respiratory specimens during the acute phase of infection. Negative results do not preclude SARS-CoV-2 infection, do not rule out co-infections with other pathogens, and should not be used as the sole basis for treatment or other patient management decisions. Negative results must be combined with clinical observations, patient history, and epidemiological information. The expected result is Negative. Fact Sheet for Patients: HairSlick.no Fact Sheet for Healthcare Providers: quierodirigir.com This test is not yet approved or cleared by the [place] FDA and  has been authorized for detection and/or diagnosis of SARS-CoV-2 by FDA under an  Emergency Use Authorization (EUA). This EUA will remain  in effect (meaning this test can be used) for the duration of the COVID-19 declaration under Section 56 4(b)(1) of the Act, 21 U.S.C. section 360bbb-3(b)(1), unless the authorization is terminated or revoked sooner. Performed at [facility], [address] [place], [place] [address]   CSF culture     Status: None   Collection Time: 12/12/19 12:02 AM   Specimen: CSF; Cerebrospinal Fluid  Result Value Ref Range Status   Specimen Description CSF  Final   Special Requests   Final    NONE Performed at [facility], [address], [place], [place] [address]    Gram Stain   Final    FEW WBC PRESENT, PREDOMINANTLY MONONUCLEAR NO ORGANISMS SEEN RESULT CALLED TO, READ BACK BY AND VERIFIED WITH: RN KATIE AT 0132 12/12/19 CRUICKSHANK A Performed at [facility], [address], [place], [place] [address]    Culture   Final    NO GROWTH 3 DAYS  Performed at [facility], [address], [place], [place] [address]    Report Status 12/15/2019 FINAL  Final  MRSA PCR Screening     Status: None   Collection Time: 12/12/19  9:52 AM   Specimen: Nasal Mucosa; Nasopharyngeal  Result Value Ref Range Status   MRSA by PCR NEGATIVE NEGATIVE Final    Comment:        The GeneXpert MRSA Assay (FDA approved for NASAL specimens only), is one component of a comprehensive MRSA colonization surveillance program. It is not intended to diagnose MRSA infection nor to guide or monitor treatment for MRSA infections. Performed at [facility], [address] [place], [place] [address]    Time coordinating discharge: 35 minutes  SIGNED:  Merlene Laughter, DO [facility] 12/17/2019, 1:39 PM Pager is on AMION  If 7PM-7AM, please contact night-coverage [email or web address] Password [email or web address]

## 2019-12-17 NOTE — Plan of Care (Signed)
  Problem: Consults Goal: RH GENERAL PATIENT EDUCATION Description: See Patient Education module for education specifics. Outcome: Progressing Goal: Skin Care Protocol Initiated - if Braden Score 18 or less Description: If consults are not indicated, leave blank or document N/A Outcome: Progressing Goal: Nutrition Consult-if indicated Outcome: Progressing   Problem: RH BOWEL ELIMINATION Goal: RH STG MANAGE BOWEL WITH ASSISTANCE Description: STG Manage Bowel with Assistance. Outcome: Progressing Goal: RH STG MANAGE BOWEL W/MEDICATION W/ASSISTANCE Description: STG Manage Bowel with Medication with Assistance. Outcome: Progressing   Problem: RH BLADDER ELIMINATION Goal: RH STG MANAGE BLADDER WITH ASSISTANCE Description: STG Manage Bladder With Assistance Outcome: Progressing Goal: RH STG MANAGE BLADDER WITH MEDICATION WITH ASSISTANCE Description: STG Manage Bladder With Medication With Assistance. Outcome: Progressing   Problem: RH SKIN INTEGRITY Goal: RH STG SKIN FREE OF INFECTION/BREAKDOWN Outcome: Progressing Goal: RH STG MAINTAIN SKIN INTEGRITY WITH ASSISTANCE Description: STG Maintain Skin Integrity With Assistance. Outcome: Progressing   Problem: RH SAFETY Goal: RH STG ADHERE TO SAFETY PRECAUTIONS W/ASSISTANCE/DEVICE Description: STG Adhere to Safety Precautions With Assistance/Device. Outcome: Progressing Goal: RH STG DECREASED RISK OF FALL WITH ASSISTANCE Description: STG Decreased Risk of Fall With Assistance. Outcome: Progressing   Problem: RH PAIN MANAGEMENT Goal: RH STG PAIN MANAGED AT OR BELOW PT'S PAIN GOAL Outcome: Progressing   Problem: RH KNOWLEDGE DEFICIT GENERAL Goal: RH STG INCREASE KNOWLEDGE OF SELF CARE AFTER HOSPITALIZATION Outcome: Progressing

## 2019-12-17 NOTE — Progress Notes (Signed)
Inpatient Rehab Admissions Coordinator:   Spoke with pt's mother, [name].  She is agreeable to CIR for pt and has good family support for 24/7 assist at discharge if needed.  Plan to admit pt today, Dr. Marland Mcalpine in agreement.  Will let CM and pt know.   Estill Dooms, PT, DPT Admissions Coordinator [phone] 12/17/19  1:26 PM

## 2019-12-18 ENCOUNTER — Inpatient Hospital Stay (HOSPITAL_COMMUNITY): Payer: Medicaid Other

## 2019-12-18 ENCOUNTER — Inpatient Hospital Stay (HOSPITAL_COMMUNITY): Payer: Self-pay

## 2019-12-18 ENCOUNTER — Inpatient Hospital Stay (HOSPITAL_COMMUNITY): Payer: Self-pay | Admitting: Occupational Therapy

## 2019-12-18 DIAGNOSIS — M7989 Other specified soft tissue disorders: Secondary | ICD-10-CM

## 2019-12-18 LAB — CBC WITH DIFFERENTIAL/PLATELET
Abs Immature Granulocytes: 0.46 10*3/uL — ABNORMAL HIGH (ref 0.00–0.07)
Basophils Absolute: 0.1 10*3/uL (ref 0.0–0.1)
Basophils Relative: 1 %
Eosinophils Absolute: 0.1 10*3/uL (ref 0.0–0.5)
Eosinophils Relative: 1 %
HCT: 43.8 % (ref 39.0–52.0)
Hemoglobin: 15 g/dL (ref 13.0–17.0)
Immature Granulocytes: 3 %
Lymphocytes Relative: 23 %
Lymphs Abs: 3.1 10*3/uL (ref 0.7–4.0)
MCH: 31.7 pg (ref 26.0–34.0)
MCHC: 34.2 g/dL (ref 30.0–36.0)
MCV: 92.6 fL (ref 80.0–100.0)
Monocytes Absolute: 1.1 10*3/uL — ABNORMAL HIGH (ref 0.1–1.0)
Monocytes Relative: 8 %
Neutro Abs: 8.5 10*3/uL — ABNORMAL HIGH (ref 1.7–7.7)
Neutrophils Relative %: 64 %
Platelets: 216 10*3/uL (ref 150–400)
RBC: 4.73 MIL/uL (ref 4.22–5.81)
RDW: 13.3 % (ref 11.5–15.5)
WBC: 13.4 10*3/uL — ABNORMAL HIGH (ref 4.0–10.5)
nRBC: 0 % (ref 0.0–0.2)

## 2019-12-18 LAB — COMPREHENSIVE METABOLIC PANEL
ALT: 18 U/L (ref 0–44)
AST: 17 U/L (ref 15–41)
Albumin: 3.3 g/dL — ABNORMAL LOW (ref 3.5–5.0)
Alkaline Phosphatase: 33 U/L — ABNORMAL LOW (ref 38–126)
Anion gap: 9 (ref 5–15)
BUN: 16 mg/dL (ref 6–20)
CO2: 28 mmol/L (ref 22–32)
Calcium: 8.7 mg/dL — ABNORMAL LOW (ref 8.9–10.3)
Chloride: 102 mmol/L (ref 98–111)
Creatinine, Ser: 1 mg/dL (ref 0.61–1.24)
GFR calc Af Amer: >60 mL/min (ref >60)
GFR calc non Af Amer: >60 mL/min (ref >60)
Glucose, Bld: 96 mg/dL (ref 70–99)
Potassium: 3.9 mmol/L (ref 3.5–5.1)
Sodium: 139 mmol/L (ref 135–145)
Total Bilirubin: 0.8 mg/dL (ref 0.3–1.2)
Total Protein: 6 g/dL — ABNORMAL LOW (ref 6.5–8.1)

## 2019-12-18 NOTE — Progress Notes (Signed)
This nurse notified by Loraine Leriche- nurse tech that patient scored a 2 on MEWS. This nurse rechecked vital and patient scored 4. Patient denies pain or feeling dizzy at this time.  Jesusita Oka PA notified no new orders at this time except to monitor patient. Charge nurse [employer] notified and aware. Will continue to monitor.

## 2019-12-18 NOTE — Evaluation (Signed)
Occupational Therapy Assessment and Plan  Patient Details  Name: Jaime [name] MRN: [id number] Date of Birth: 1995/11/11  OT Diagnosis: ataxia, disturbance of vision and muscle weakness (generalized) Rehab Potential: Rehab Potential (ACUTE ONLY): Good ELOS: 7-10 days   Today's Date: 12/18/2019 OT Individual Time: 1000-1100  &  1255-1330 OT Individual Time Calculation (min): 60 min   & 35 min  Problem List:  Patient Active Problem List   Diagnosis Date Noted  . Multiple sclerosis exacerbation ([place]) 12/17/2019  . Lower extremity weakness 12/12/2019  . 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 MS (multiple sclerosis) ([place]) 12/12/2019    Past Medical History:  Past Medical History:  Diagnosis Date  . Autism   . Depression    Past Surgical History: History reviewed. No pertinent surgical history.  Assessment & Plan Clinical Impression: Patient is a 24 y.o. year old male with history of depression well-documented high functioning autism. Per chart review lives with his mother and 3 younger siblings. Second floor apartment. Reportedly independent prior to admission had been volunteering at [employer]. Presented 12/12/2019 with weakness, blurred vision and falls x3 months. On 1 occasion he did strike his head without loss of consciousness. Denies any bowel or bladder disturbances. MRI showed patchy signal abnormality within the visualized distal cord consistent with demyelinating disease. No abnormal enhancement to suggest active demyelination. Tiny central disc protrusion at L5-S1 without stenosis or impingement. Admission chemistries unremarkable except potassium 3.4, SARS coronavirus negative. LP completed CSF no growth glucose 57 protein 33. Neurology follow-up suspect multiple sclerosis placed on IV Solu-Medrol x5 days with plan to follow-up Dr. Felecia Shelling as   Patient transferred [facility] 12/17/2019 .    Patient currently requires min with basic self-care  skills and IADL secondary to decreased cardiorespiratoy endurance, motor apraxia, decreased coordination and decreased motor planning, decreased visual motor skills, central origin and decreased standing balance and decreased postural control.  Prior to hospitalization, patient could complete adl with independent .  Patient will benefit from skilled intervention to decrease level of assist with basic self-care skills, increase independence with basic self-care skills and increase level of independence with iADL prior to discharge home with care partner.  Anticipate patient will require intermittent supervision and follow up outpatient.  OT - End of Session Activity Tolerance: Tolerates 30+ min activity with multiple rests Endurance Deficit: Yes Endurance Deficit Description: fatigue with adl tasks OT Assessment Rehab Potential (ACUTE ONLY): Good OT Patient demonstrates impairments in the following area(s): Balance;Endurance;Motor;Vision OT Basic ADL's Functional Problem(s): Grooming;Bathing;Toileting;Dressing OT Advanced ADL's Functional Problem(s): Simple Meal Preparation OT Transfers Functional Problem(s): Toilet;Tub/Shower OT Additional Impairment(s): Fuctional Use of Upper Extremity OT Plan OT Intensity: Minimum of 1-2 x/day, 45 to 90 minutes OT Frequency: 5 out of 7 days OT Duration/Estimated Length of Stay: 7-10 days OT Treatment/Interventions: Balance/vestibular training;Neuromuscular re-education;Self Care/advanced ADL retraining;Therapeutic Exercise;DME/adaptive equipment instruction;Community reintegration;Patient/family education;UE/LE Coordination activities;Discharge planning;Functional mobility training;Therapeutic Activities;Visual/perceptual remediation/compensation OT Self Feeding Anticipated Outcome(s): independent OT Basic Self-Care Anticipated Outcome(s): mod I OT Toileting Anticipated Outcome(s): mod I OT Bathroom Transfers Anticipated Outcome(s): supervision OT  Recommendation Patient destination: Home Follow Up Recommendations: Outpatient OT Equipment Recommended: Tub/shower seat   Skilled Therapeutic Intervention AM session:   Patient seated in recliner, alert and cooperative.  Evaluation completed as documented below.  He presents with visual motor deficit, motor/coordination impairment, ataxia, balance impairment limiting all aspects of mobility and self care.  Reviewed role of OT, plan of care, safety in hospital environment, goals for  therapy.  He is slow to respond at times but demonstrates good recall and carryover of information provided.  He participated in functional ambulation in room with CGA/min A without AD, cues for safety.  Toilet and shower transfer with min A.  Bathing at shower level with min A.  toileting with min A.  Dressing seated in arm chair with min A.  He returned to recliner at close of session, seat belt alarm set and call bell in reach.    PM session:  Patient seated in recliner, finishing lunch.  He is able to recall information from am session.  Completed visual motor activities and seated coordination activities.  He reports moderate dizziness with visual motor activity that resolves with rest.  Depth perception WNL.  He remained seated in recliner at close of session, seat alarm set and call bell in reach.      OT Evaluation Precautions/Restrictions  Precautions Precautions: Fall Restrictions Weight Bearing Restrictions: No General   Vital Signs Therapy Vitals Temp: 98.2 F (36.8 C) Pulse Rate: (!) 103 Resp: 19 BP: (!) 90/48 Patient Position (if appropriate): Lying Oxygen Therapy SpO2: 97 % O2 Device: Room Air Pain Pain Assessment Pain Scale: 0-10 Pain Score: 0-No pain Home Living/Prior Functioning Home Living Available Help at Discharge: Family Type of Home: Apartment Home Access: Stairs to enter Technical brewer of Steps: flight Entrance Stairs-Rails: Right Home Layout: One level Bathroom  Shower/Tub: Optometrist: Yes  Lives With: Family Prior Function  Able to Take Stairs?: Reciprically Driving: No Vocation: Psychologist, 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, also enjoys music and plays guitar ADL ADL Eating: Independent Where Assessed-Eating: Chair Grooming: Supervision/safety, Contact guard Where Assessed-Grooming: Standing at sink Upper Body Bathing: Supervision/safety Where Assessed-Upper Body Bathing: Shower Lower Body Bathing: Minimal assistance Where Assessed-Lower Body Bathing: Shower Upper Body Dressing: Minimal assistance Where Assessed-Upper Body Dressing: Chair Lower Body Dressing: Minimal assistance Where Assessed-Lower Body Dressing: Chair Toileting: Minimal assistance Where Assessed-Toileting: Glass blower/designer: Psychiatric nurse Method: Counselling psychologist: Emergency planning/management officer Transfer: Environmental education officer Method: Heritage manager: Grab bars, Radio broadcast assistant Vision Baseline Vision/History: Wears glasses Wears Glasses: At all times Patient Visual Report: Blurring of vision(patient notes blurred vision starting in left eye and progressing to right prior to admission but resolved at this time) Vision Assessment?: Yes Eye Alignment: Within Functional Limits Ocular Range of Motion: Within Functional Limits Alignment/Gaze Preference: Within Defined Limits Tracking/Visual Pursuits: Decreased smoothness of vertical tracking;Decreased smoothness of horizontal tracking;Requires cues, head turns, or add eye shifts to track Saccades: Other (comment);Decreased speed of saccadic movement(dizziness with head & eye movement in horizontal plane) Convergence: Within functional limits Visual Fields: No apparent deficits Additional Comments: brock string- depth assessment WNL, pursuits ant/post  with moderate dizziness, acuity OS, OD, OU: distant at least 20/30, near at least 20/23 with glasses on, VOR able to complete at slow rate with occ loss of focus on object Perception  Perception: Within Functional Limits Praxis Praxis: Intact Cognition Overall Cognitive Status: Within Functional Limits for tasks assessed Arousal/Alertness: Awake/alert Orientation Level: Person;Place;Situation Person: Oriented Place: Oriented Situation: Oriented Year: 2021 Month: March Day of Week: Correct Memory: Appears intact Immediate Memory Recall: Sock;Blue;Bed Memory Recall Sock: Without Cue Memory Recall Blue: Without Cue Memory Recall Bed: Without Cue Attention: Focused;Sustained Focused Attention: Appears intact Sustained Attention: Appears intact Awareness: Appears intact Problem Solving: Appears intact Safety/Judgment: Appears intact Comments: Requires increased  time to respond and difficulty with duel tasking activities (appears to be baseline for him) Sensation Sensation Light Touch: Appears Intact Proprioception: Impaired Detail Proprioception Impaired Details: Impaired RLE;Impaired LLE Coordination Gross Motor Movements are Fluid and Coordinated: No Fine Motor Movements are Fluid and Coordinated: No Coordination and Movement Description: mild-moderate ataxia LE>UE Finger Nose Finger Test: slow rate bilaterally Heel Shin Test: decreased speed 9 Hole Peg Test: R = 1 min, 13 sec.    L = 58 sec Motor  Motor Motor: Ataxia Mobility  Bed Mobility Bed Mobility: Rolling Right;Rolling Left;Supine to Sit;Sit to Supine Rolling Right: Independent Rolling Left: Independent Supine to Sit: Supervision/Verbal cueing Sit to Supine: Supervision/Verbal cueing Transfers Sit to Stand: Contact Guard/Touching assist Stand to Sit: Contact Guard/Touching assist  Trunk/Postural Assessment  Cervical Assessment Cervical Assessment: Within Functional Limits Thoracic Assessment Thoracic  Assessment: Within Functional Limits Lumbar Assessment Lumbar Assessment: Within Functional Limits Postural Control Postural Control: Deficits on evaluation Righting Reactions: decreased/delayed Protective Responses: decreased/delayed Postural Limitations: decreased  Balance Static Sitting Balance Static Sitting - Level of Assistance: 5: Stand by assistance Dynamic Sitting Balance Dynamic Sitting - Level of Assistance: 5: Stand by assistance Static Standing Balance Static Standing - Level of Assistance: 4: Min assist Dynamic Standing Balance Dynamic Standing - Level of Assistance: 4: Min assist Extremity/Trunk Assessment RUE Assessment RUE Assessment: Within Functional Limits General Strength Comments: 4+ / 5 LUE Assessment LUE Assessment: Within Functional Limits General Strength Comments: 4+ / 5     Refer to Care Plan for Long Term Goals  Recommendations for other services: None    Discharge Criteria: Patient will be discharged from OT if patient refuses treatment 3 consecutive times without medical reason, if treatment goals not met, if there is a change in medical status, if patient makes no progress towards goals or if patient is discharged from hospital.  The above assessment, treatment plan, treatment alternatives and goals were discussed and mutually agreed upon: by patient  Carlos Levering 12/18/2019, 3:34 PM

## 2019-12-18 NOTE — Evaluation (Signed)
Physical Therapy Assessment and Plan  Patient Details  Name: Jaime [name] MRN: [id number] Date of Birth: July 16, 1996  PT Diagnosis: Abnormality of gait, Ataxia, Coordination disorder, Difficulty walking, Dizziness and giddiness and Vertigo of central origin Rehab Potential: Good ELOS: 7-10 days   Today's Date: 12/18/2019 PT Individual Time: 0800-0905 PT Individual Time Calculation (min): 65 min    Problem List:  Patient Active Problem List   Diagnosis Date Noted  . Multiple sclerosis exacerbation ([place]) 12/17/2019  . Lower extremity weakness 12/12/2019  . Demyelinating disease of central nervous system ([place]) 12/12/2019  . MS (multiple sclerosis) ([place]) 12/12/2019    Past Medical History:  Past Medical History:  Diagnosis Date  . Autism   . Depression    Past Surgical History: History reviewed. No pertinent surgical history.  Assessment & Plan Clinical Impression: Patient is a 24 y.o. year old right-handed male history of depression well-documented high functioning autism. Per chart review lives with his mother and 3 younger siblings. Second floor apartment. Reportedly independent prior to admission had been volunteering at [employer]. Presented 12/12/2019 with weakness, blurred vision and falls x3 months. On 1 occasion he did strike his head without loss of consciousness. Denies any bowel or bladder disturbances. MRI showed patchy signal abnormality within the visualized distal cord consistent with demyelinating disease. No abnormal enhancement to suggest active demyelination. Tiny central disc protrusion at L5-S1 without stenosis or impingement. Admission chemistries unremarkable except potassium 3.4, SARS coronavirus negative. LP completed CSF no growth glucose 57 protein 33. Neurology follow-up suspect multiple sclerosis placed on IV Solu-Medrol x5 days with plan to follow-up Dr. Felecia Shelling as an outpatient. Lovenox for DVT prophylaxis. Tolerating a regular consistency diet.  Therapy evaluations completed and patient was admitted for a comprehensive rehab program.Patient transferred to [facility] on 12/17/2019 .   Patient currently requires min with mobility secondary to muscle weakness, decreased cardiorespiratoy endurance, unbalanced muscle activation, ataxia and decreased coordination, central origin and decreased sitting balance, decreased standing balance, decreased postural control and decreased balance strategies.  Prior to hospitalization, patient was independent  with mobility and lived with   in a Apartment(on second floor) home.  Home access is flightStairs to enter.  Patient will benefit from skilled PT intervention to maximize safe functional mobility, minimize fall risk and decrease caregiver burden for planned discharge home with intermittent assist.  Anticipate patient will benefit from follow up OP at discharge.  PT - End of Session Activity Tolerance: Tolerates 30+ min activity without fatigue Endurance Deficit: Yes Endurance Deficit Description: reported fatigue at end of session and provided short rest breaks throughout evaluation PT Assessment Rehab Potential (ACUTE/IP ONLY): Good PT Barriers to Discharge: Home environment access/layout;Decreased caregiver support PT Barriers to Discharge Comments: Has a full flight of stairs with R rail to enter second floor apartment, lives with his mother who will be his primary caregiver and 2 younger siblings PT Patient demonstrates impairments in the following area(s): Balance;Endurance;Motor;Nutrition;Perception;Safety PT Transfers Functional Problem(s): Bed Mobility;Bed to Chair;Car;Furniture;Floor PT Locomotion Functional Problem(s): Ambulation;Wheelchair Mobility;Stairs PT Plan PT Intensity: Minimum of 1-2 x/day ,45 to 90 minutes PT Frequency: 5 out of 7 days PT Duration Estimated Length of Stay: 7-10 days PT Treatment/Interventions: Ambulation/gait training;Discharge planning;Functional mobility  training;Psychosocial support;Therapeutic Activities;Visual/perceptual remediation/compensation;Balance/vestibular training;Disease management/prevention;Neuromuscular re-education;Skin care/wound management;Therapeutic Exercise;DME/adaptive equipment instruction;Pain management;Splinting/orthotics;UE/LE Strength taining/ROM;Community reintegration;Functional electrical stimulation;Patient/family education;Stair training;UE/LE Coordination activities PT Transfers Anticipated Outcome(s): Mod I PT Locomotion Anticipated Outcome(s): Mod I household, Supervision community >500 ft PT Recommendation Recommendations for Other Services: Neuropsych consult Follow  Up Recommendations: Outpatient PT Patient destination: Home Equipment Recommended: To be determined Equipment Details: Patient has no DME at home, will determine equipment needs based on patient progress  Skilled Therapeutic Intervention Session 1: In addition to the PT evaluation below, the patient performed the following skilled PT interventions: Patient in bed with MD in room upon PT arrival. Patient alert and agreeable to PT session. Patient denied pain during session.  Therapeutic Activity: Bed Mobility: Patient performed rolling R/L on a mat table independently and supine to/from sit with supervision due to intermittent dizziness in a flat bed and on a mat table without AD. Patient reported brief dizziness when going from lying to sitting both times. Transfers: Patient performed sit to/from stand x10 with CGA progressing to supervision with and without UE use. Provided verbal cues for forward weight shift, reaching back to sit for safety, and not using UEs for strengthening. Patient performed a simulated sedan height car transfer with min A for balance without an AD. Performed step-in technique holding car frame as opposed to sitting prior to bringing LEs into the car. Educated on increased safety with alternate technique.   Gait Training:   Patient ambulated 155 feet, 177 feet x2, and 132 feet x2 with without an AD with min A-CGA. Ambulated with wide BOS, variable foot placement R>L, increased knee flexion in swing on R, LOB and reports of dizziness during turns 2-4 times per trial, worse with sudden turns versus planned turns, recovered with min A and dizziness resolved with 3-5 sec stand before continuing.  Patient ambulated up/down a ramp, over 10 feet of mulch (unlevel surface), and up/down a curb to simulate community ambulation over unlevel surfaces with min A-CGA with intermittent use of 1 rail for steadying support.  Patient ascended/descended 12 steps using B rails with CGA-min A. Performed reciprocal gait pattern with intermittent step-to gait during descent.   Neuromuscular Re-ed: Patient performed the Berg Balance Test: Patient demonstrates increased fall risk as noted by score of  35/56 on Berg Balance Scale.  (<36= high risk for falls, close to 100%; 37-45 significant >80%; 46-51 moderate >50%; 52-55 lower >25%)  Patient in recliner in the room at end of session with breaks locked, seat belt alarm set, and all needs within reach.   Vitals:  Supine: 107/64 HR 68 Sitting: 111/76 HR 124 HR remained between 92-117 with activity throughout session. Only reports of dizziness occurred with change in position and turns.   Instructed pt in results of PT evaluation as detailed below, PT POC, rehab potential, rehab goals, and discharge recommendations. Additionally discussed CIR's policies regarding fall safety and use of chair alarm and/or quick release belt. Pt verbalized understanding and in agreement. Will update pt's family members as they become available.   Patient in recliner in room upon PT arrival. Patient alert and agreeable to PT session. Patient denied pain during session.  Therapeutic Activity: Transfers: Patient performed sit to/from stand x1 with supervision. Provided verbal cues for pushing up from arm rest and  reaching back to sit for safety.  Gait Training:  Patient ambulated 20 feet and 10 feet without an AD with min A-CGA for balance. Ambulated as above with decreased gait speed this afternoon. Provided verbal cues for consistent foot placement on floor tiles. After ambulating the first 20 feet, RN asked what his HR was in the hallway, reported that is was elevated in sitting prior to session. HR 161 bpm,  immediately returned patient to sitting EOB.  Bed Mobility: Patient performed  supine to/from sit with supervision for safety in a flat bed without use of bed rail x2. Patient reported same  brief dizziness, 1-2 sec, when going from lying to sitting both times. HR 118-128 bpm in sitting ~5 min x2, HR 98-109 bpm in lying ~5 min x2, RN and MD made aware.  Patient in bed with RN in room at end of session with breaks locked, bed alarm set, and all needs within reach.   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, also enjoys music and plays guitar Vision/Perception  Geologist, engineering: Within Advertising copywriter Praxis Praxis: Intact  Cognition Overall Cognitive Status: Within Functional Limits for tasks assessed(hx of autism, pt high functioning at baseline) Arousal/Alertness: Awake/alert Orientation Level: Oriented X4 Attention: Focused;Sustained Focused Attention: Appears intact Sustained Attention: Appears intact Memory: Appears intact Awareness: Appears  intact Problem Solving: Appears intact Safety/Judgment: Appears intact Comments: Requires increased time to respond and difficulty with duel tasking activities (appears to be baseline for him) Sensation Sensation Light Touch: Appears Intact Proprioception: Impaired Detail Proprioception Impaired Details: Impaired RLE;Impaired LLE Coordination Gross Motor Movements are Fluid and Coordinated: No Fine Motor Movements are Fluid and Coordinated: No Coordination and Movement Description: mild-moderate ataxia LE>UE Heel Shin Test: decreased speed Motor  Motor Motor: Ataxia  Mobility Bed Mobility Bed Mobility: Rolling Right;Rolling Left;Supine to Sit;Sit to Supine Rolling Right: Independent Rolling Left: Independent Supine to Sit: Supervision/Verbal cueing Sit to Supine: Supervision/Verbal cueing Transfers Transfers: Sit to Stand;Stand to Sit;Stand Pivot Transfers Sit to Stand: Contact Guard/Touching assist Stand to Sit: Contact Guard/Touching assist Stand Pivot Transfers: Contact Guard/Touching assist Transfer (Assistive device): None Locomotion  Gait Ambulation: Yes Gait Assistance: Minimal Assistance - Patient > 75% Gait Distance (Feet): 155 Feet Assistive device: None Gait Gait: Yes Gait Pattern: Ataxic;Decreased trunk rotation;Decreased stride length;Wide base of support Gait velocity: decreased Stairs / Additional Locomotion Stairs: Yes Stairs Assistance: Contact Guard/Touching assist Stair Management Technique: Two rails Number of Stairs: 12 Height of Stairs: 6 Ramp: Contact Guard/touching assist Curb: Contact Guard/Touching assist(with 1 rail) Wheelchair Mobility Wheelchair Mobility: No  Trunk/Postural Assessment  Cervical Assessment Cervical Assessment: Within Functional Limits Thoracic Assessment Thoracic Assessment: Within Functional Limits Lumbar Assessment Lumbar Assessment: Within Functional Limits Postural Control Postural Control: Deficits on  evaluation Righting Reactions: decreased/delayed Protective Responses: decreased/delayed Postural Limitations: decreased  Balance Balance Balance Assessed: Yes Standardized Balance Assessment Standardized Balance Assessment: Berg Balance Test Berg Balance Test Sit to Stand: Able to stand without using hands and stabilize independently Standing Unsupported: Able to stand 2 minutes with supervision Sitting with Back Unsupported but Feet Supported on Floor or Stool: Able to sit safely and securely 2 minutes Stand to Sit: Sits safely with minimal use of hands Transfers: Able to transfer safely, minor use of hands Standing Unsupported with Eyes Closed: Able to stand 10 seconds with supervision Standing Ubsupported with Feet Together: Able to place feet together independently but unable to hold for 30 seconds From Standing, Reach Forward with Outstretched Arm: Reaches forward but needs supervision From Standing Position, Pick up Object from Floor: Able to pick up shoe, needs supervision From Standing Position, Turn to Look  Behind Over each Shoulder: Needs supervision when turning Turn 360 Degrees: Needs close supervision or verbal cueing Standing Unsupported, Alternately Place Feet on Step/Stool: Able to complete >2 steps/needs minimal assist Standing Unsupported, One Foot in Front: Able to plae foot ahead of the other independently and hold 30 seconds Standing on One Leg: Tries to lift leg/unable to hold 3 seconds but remains standing independently Total Score: 35 Static Sitting Balance Static Sitting - Balance Support: No upper extremity supported;Feet unsupported Static Sitting - Level of Assistance: 5: Stand by assistance Dynamic Sitting Balance Dynamic Sitting - Balance Support: No upper extremity supported;During functional activity;Feet supported Dynamic Sitting - Level of Assistance: 5: Stand by assistance Dynamic Sitting - Balance Activities: Lateral lean/weight shifting;Forward  lean/weight shifting;Reaching for objects Static Standing Balance Static Standing - Balance Support: No upper extremity supported;During functional activity Static Standing - Level of Assistance: 4: Min assist Dynamic Standing Balance Dynamic Standing - Balance Support: No upper extremity supported;During functional activity Dynamic Standing - Level of Assistance: 4: Min assist Dynamic Standing - Balance Activities: Lateral lean/weight shifting;Forward lean/weight shifting;Reaching for objects Extremity Assessment  RUE Assessment RUE Assessment: Within Functional Limits General Strength Comments: 4+ / 5 LUE Assessment LUE Assessment: Within Functional Limits General Strength Comments: 4+ / 5 RLE Assessment RLE Assessment: Within Functional Limits Active Range of Motion (AROM) Comments: WFL for all functional mobility General Strength Comments: Grossly 5/5 in sitting LLE Assessment LLE Assessment: Within Functional Limits Active Range of Motion (AROM) Comments: WFL for all functional mobility General Strength Comments: Grossly 5/5 in sitting    Refer to Care Plan for Long Term Goals  Recommendations for other services: Neuropsych  Discharge Criteria: Patient will be discharged from PT if patient refuses treatment 3 consecutive times without medical reason, if treatment goals not met, if there is a change in medical status, if patient makes no progress towards goals or if patient is discharged from hospital.  The above assessment, treatment plan, treatment alternatives and goals were discussed and mutually agreed upon: by patient  Doreene Burke PT, DPT  12/18/2019, 12:32 PM

## 2019-12-18 NOTE — Plan of Care (Signed)
  Problem: Consults Goal: RH GENERAL PATIENT EDUCATION Description: See Patient Education module for education specifics. Outcome: Progressing   Problem: RH BLADDER ELIMINATION Goal: RH STG MANAGE BLADDER WITH ASSISTANCE Description: STG Manage Bladder With Assistance Outcome: Progressing   Problem: RH SKIN INTEGRITY Goal: RH STG SKIN FREE OF INFECTION/BREAKDOWN Outcome: Progressing   Problem: RH SAFETY Goal: RH STG DECREASED RISK OF FALL WITH ASSISTANCE Description: STG Decreased Risk of Fall With Assistance. Outcome: Progressing   Problem: RH PAIN MANAGEMENT Goal: RH STG PAIN MANAGED AT OR BELOW PT'S PAIN GOAL Outcome: Progressing

## 2019-12-18 NOTE — Patient Care Conference (Signed)
Inpatient RehabilitationTeam Conference and Plan of Care Update Date: 12/18/2019   Time: 11:35 AM    Patient Name: Jaime [name]      Medical Record Number: [id number]  Date of Birth: 1995-11-21 Sex: Male         Room/Bed: 4M11C/4M11C-01 Payor Info: Payor: MEDICAID [place] / Plan: MEDICAID [place] ACCESS / Product Type: *No Product type* /    Admit Date/Time:  12/17/2019  4:42 PM  Primary Diagnosis:  MS (multiple sclerosis) ([facility])  Patient Active Problem List   Diagnosis Date Noted  . Tachycardia   . Dysautonomia orthostatic hypotension syndrome ([facility])   . Multiple sclerosis exacerbation ([facility]) 12/17/2019  . Lower extremity weakness 12/12/2019  . Demyelinating disease of central nervous system ([facility]) 12/12/2019  . MS (multiple sclerosis) ([facility]) 12/12/2019    Expected Discharge Date: Expected Discharge Date: 12/26/19  Team Members Present: Physician leading conference: Dr. Genice Rouge Care Coodinator Present: Cecile Sheerer, LCSWA;Genie Mili Piltz, RN, MSN Nurse Present: Other (comment)(Marie Poynter, RN) PT Present: Serina Cowper, PT OT Present: Towanda Malkin, OT SLP Present: Suzzette Righter, CF-SLP PPS Coordinator present : Edson Snowball, [place] Breed, SLP     Current Status/Progress Goal Weekly Team Focus  Bowel/Bladder   Continent of b/b, last documented BM 3/11 however pt states he does not know when LBM was  Remain continent of b/b  Assess q shift and prn   Swallow/Nutrition/ Hydration             ADL's   min A  CS/mod I  adl and transfer training, balance, visual motor re-education, family education, GMC/FMC   Mobility   Supervision bed mobility, CGA-min A transfers and gait >150' without an AD, CGA 12 steps B rails  Mod I household mobiliyt, supervision community mobility  Balance, vestibular exercises/activities, functional mobility, activity tolerance, gait and stair training, patient/caregiver education   Communication             Safety/Cognition/ Behavioral  Observations            Pain   Denies pain  Remain pain free  Assess pain q shift and prn   Skin   Old skin tear to left elbow covered in foam  No new skin impairments  Assess skin q shift and prn    Rehab Goals Patient on target to meet rehab goals: Yes *See Care Plan and progress notes for long and short-term goals.     Barriers to Discharge  Current Status/Progress Possible Resolutions Date Resolved   Nursing                  PT  Home environment access/layout;Decreased caregiver support;Other (comments);Medical stability  1 flight of stairs with 1 rail to enter apartment, patient's mother to be primary caregiver and has 2 other children in the home, patient with intermittnet elevated HR, and limited by dizziness with mobility              OT                  SLP                SW                Discharge Planning/Teaching Needs:  D/c to home with 24/7 care from patient mother  Family education as recommended.   Team Discussion: MD is autistic, not on meds, developing MS, on 7 days solumedrol, bladder problems, inc accidents, balance issues.  RN delayed responses.  OT eval today slow to respond, impulsive, amb to bathroom, shower close S, dressing CGA/S, dizzy with head turns, mod I/S goals.  PT eval today bed S, transfers CGA/S, amb 150' no device CGA/min A, LOB, moderate ataxia, dizzy on turns, needs cues, 12 steps CGA 2 rails, has a full flight to apt at home, high fall risk, mod I/S goals.  Lives with mom.   Revisions to Treatment Plan: N/A     Medical Summary Current Status: need flu vaccine- delayed responses; continent B/B- Berg 35/56 Weekly Focus/Goal: OT_ was continent; shower close SBA; CGA for dressing with cues; a long time for ADLs  Barriers to Discharge: Behavior;Other (comments);Decreased family/caregiver support;Home enviroment access/layout;Neurogenic Bowel & Bladder;Incontinence  Barriers to Discharge Comments: autistic- slightly impulsive; also dizzy with  nystagmus Possible Resolutions to Barriers: PT_ dizzy with head movement; moves well- bed supervison; transfers CGA-Supervision; walked 150 ft no AD/HHA- CGA- ataxic somewhat; dizzy on turns   Continued Need for Acute Rehabilitation Level of Care: The patient requires daily medical management by a physician with specialized training in physical medicine and rehabilitation for the following reasons: Direction of a multidisciplinary physical rehabilitation program to maximize functional independence : Yes Medical management of patient stability for increased activity during participation in an intensive rehabilitation regime.: Yes Analysis of laboratory values and/or radiology reports with any subsequent need for medication adjustment and/or medical intervention. : Yes   I attest that I was present, lead the team conference, and concur with the assessment and plan of the team.   Jodell Cipro M 12/20/2019, 3:02 PM   Team conference was held via web/ teleconference due to [place] - 19

## 2019-12-18 NOTE — Progress Notes (Signed)
Physical Therapy Session Note  Patient Details  Name: Jaime [name] MRN: [id number] Date of Birth: May 30, 1996  Today's Date: 12/18/2019 PT Individual Time: 1415-1451 PT Individual Time Calculation (min): 36 min   Short Term Goals: Week 1:  PT Short Term Goal 1 (Week 1): STG=LTG due to short ELOS.  Patient in recliner in room upon PT arrival. Patient alert and agreeable to PT session. Patient denied pain during session.  Therapeutic Activity: Transfers: Patient performed sit to/from stand x1 with supervision. Provided verbal cues for pushing up from arm rest and reaching back to sit for safety.  Gait Training:  Patient ambulated 20 feet and 10 feet without an AD with min A-CGA for balance. Ambulated as above with decreased gait speed this afternoon. Provided verbal cues for consistent foot placement on floor tiles. After ambulating the first 20 feet, RN asked what his HR was in the hallway, reported that is was elevated in sitting prior to session. HR 161 bpm, immediately returned patient to sitting EOB.  Bed Mobility: Patient performed supine to/from sit with supervision for safety in a flat bed without use of bed rail x2. Patient reported same  brief dizziness, 1-2 sec, when going from lying to sitting both times.  HR 118-128 bpm in sitting ~5 min x2, HR 98-109 bpm in lying ~5 min x2, RN and MD made aware.  Discussed potential stressors with patient. He reports that his brother came down to see him from [place] and was unable to come to the hospital due to the visitation restrictions and that he is returning to [place] today. Stated that this was causing some distress today. Otherwise, he reported that he does not feel anxious and displayed no outward signs of distress during session.  Patient in bed with RN in room at end of session with breaks locked, bed alarm set, and all needs within reach.   Precautions/Restrictions Precautions Precautions: Fall Restrictions Weight Bearing  Restrictions: No  Therapy/Group: Individual Therapy  Jaime [name] PT, DPT  12/18/2019, 3:52 PM

## 2019-12-18 NOTE — Progress Notes (Signed)
Inpatient Rehabilitation  Patient information reviewed and entered into eRehab system by Bonnee Zertuche M. Dayven Linsley, M.A., CCC/SLP, PPS Coordinator.  Information including medical coding, functional ability and quality indicators will be reviewed and updated through discharge.    

## 2019-12-18 NOTE — Progress Notes (Signed)
[facility] PHYSICAL MEDICINE & REHABILITATION PROGRESS NOTE   Subjective/Complaints:  Pt reports passing gas, but no BM in last 24-48 hours.  Doesn't feel constipated.  Voiding well however cannot hold it- having accidents still.   No pain; slept OK.   Per PT, Sharlene Motts is 35/56 Also had Pulse up to 160s with walking ~20-40 ft this afternoon- walked further this AM without incident.  Will monitor.   ROS:  Pt denies SOB, abd pain, CP, N/V/C/D, and vision changes  Objective:   MR BRAIN W WO CONTRAST  Result Date: 12/16/2019 CLINICAL DATA:  Demyelinating disease. Abnormal MRI of the head 12/11/2019. Interval treatment with IV steroid. EXAM: MRI HEAD WITHOUT AND WITH CONTRAST TECHNIQUE: Multiplanar, multiecho pulse sequences of the brain and surrounding structures were obtained without and with intravenous contrast. CONTRAST:  5.43mL GADAVIST GADOBUTROL 1 MMOL/ML IV SOLN COMPARISON:  MR the head without and with contrast 12/11/2019 FINDINGS: Brain: Numerous periventricular and subcortical T2 hyperintensities bilaterally are slightly less well defined than on the prior exam. No new lesions or expansion are seen. Diffusion signal is mostly secondary to shine through. No significant restricted diffusion is present. Postcontrast imaging demonstrates no residual enhancement of the numerous T2 hyperintense lesions. The ventricles are of normal size. No significant extraaxial fluid collection is present. The internal auditory canals are within normal limits. Vascular: Flow is present in the major intracranial arteries. Skull and upper cervical spine: The craniocervical junction is normal. Upper cervical spine is within normal limits. Marrow signal is unremarkable. Sinuses/Orbits: The paranasal sinuses and mastoid air cells are clear. The globes and orbits are within normal limits. IMPRESSION: 1. Innumerable scattered white matter lesions are similar in size and number to the prior exam. The lesions are  slightly less well-defined than on the prior study. 2. Previously seen enhancement is no longer present. This likely represents some response to the interval therapy. 3. No new lesions or evidence for ongoing active demyelination. Electronically Signed   By: Marin Roberts M.D.   On: 12/16/2019 19:33   MR THORACIC SPINE W WO CONTRAST  Result Date: 12/16/2019 CLINICAL DATA:  Diffuse demyelination. Follow-up after steroid therapy. EXAM: MRI THORACIC WITHOUT AND WITH CONTRAST TECHNIQUE: Multiplanar and multiecho pulse sequences of the thoracic spine were obtained without and with intravenous contrast. CONTRAST:  5.71mL GADAVIST GADOBUTROL 1 MMOL/ML IV SOLN COMPARISON:  MRI of the thoracic spine 12/11/19 FINDINGS: MRI THORACIC SPINE FINDINGS Alignment:  No significant listhesis is present. Vertebrae: Marrow signal and vertebral body heights are normal. Cord: Faint enhancement on the right at T1-2 is near completely resolved. No enhancement remains in the lower thoracic spine. Numerous T2 hyperintensities within the cord slightly less well defined than on the prior exam. No new lesions are present. The cord is not expanded. Paraspinal and other soft tissues: Paraspinous soft tissues are within limits. Lungs are clear. Disc levels: Focal disc disease at T4-5 and T6-7 is again noted. The central protrusion at T6-7 contacts and slightly distorts the ventral surface the cord. Foramina are patent bilaterally. IMPRESSION: 1. Extensive T2 lesions throughout the thoracic spinal cord is slightly less well-defined than on the prior exam. 2. Previously noted enhancement at the T1-2 level is near completely resolved. This likely reflects response to steroid treatment. 3. No evidence for disease progression. Electronically Signed   By: Marin Roberts M.D.   On: 12/16/2019 19:39   Recent Labs    12/17/19 0420 12/18/19 0409  WBC 14.6* 13.4*  HGB 14.2 15.0  HCT  40.8 43.8  PLT 211 216   Recent Labs     12/17/19 0420 12/18/19 0409  NA 139 139  K 3.9 3.9  CL 100 102  CO2 27 28  GLUCOSE 94 96  BUN 15 16  CREATININE 1.00 1.00  CALCIUM 8.7* 8.7*    Intake/Output Summary (Last 24 hours) at 12/18/2019 1609 Last data filed at 12/18/2019 1300 Gross per 24 hour  Intake 440 ml  Output 1750 ml  Net -1310 ml     Physical Exam: Vital Signs Blood pressure (!) 90/48, pulse (!) 103, temperature 98.2 F (36.8 C), resp. rate 19, SpO2 97 %.  General: awake, but sleepy- just woke up ; sat up in bed with assistance; NAD Psychiatric: appropriate, slowed to process/answer and ask questions Heart: borderline tachycardia when listened- 90-100; regular rhythm Lungs: CTA b/l Abdomen: soft, NT; ND; (+)  hypoactive BS Extremities: No clubbing, cyanosis, or edema Skin: No evidence of breakdown, no evidence of rash Neurologic:EOMI B/L however has horizontal nystagmus B/L , motor strength is 4/5 in bilateral deltoid, bicep, tricep, grip, hip flexor, knee extensors, ankle dorsiflexor and plantar flexor Sensory exam normal sensation to light touch  in bilateral upper and lower extremities Cerebellar exam minimal dysmetria finger to nose to finger as well as heel to shin in bilateral upper and lower extremities Musculoskeletal: Full range of motion in all 4 extremities. No joint swelling     Assessment/Plan: 1. Functional deficits secondary to  Likely initial MS exacerbation which require 3+ hours per day of interdisciplinary therapy in a comprehensive inpatient rehab setting.  Physiatrist is providing close team supervision and 24 hour management of active medical problems listed below.  Physiatrist and rehab team continue to assess barriers to discharge/monitor patient progress toward functional and medical goals  Care Tool:  Bathing    Body parts bathed by patient: Right arm, Left arm, Chest, Abdomen, Front perineal area, Buttocks, Right upper leg, Left upper leg, Right lower leg, Left lower  leg, Face         Bathing assist Assist Level: Minimal Assistance - Patient > 75%     Upper Body Dressing/Undressing Upper body dressing   What is the patient wearing?: Pull over shirt    Upper body assist Assist Level: Minimal Assistance - Patient > 75%    Lower Body Dressing/Undressing Lower body dressing      What is the patient wearing?: Underwear/pull up, Pants     Lower body assist Assist for lower body dressing: Minimal Assistance - Patient > 75%     Toileting Toileting    Toileting assist Assist for toileting: Minimal Assistance - Patient > 75%     Transfers Chair/bed transfer  Transfers assist     Chair/bed transfer assist level: Contact Guard/Touching assist     Locomotion Ambulation   Ambulation assist      Assist level: Minimal Assistance - Patient > 75% Assistive device: No Device Max distance: 155'   Walk 10 feet activity   Assist     Assist level: Minimal Assistance - Patient > 75% Assistive device: No Device   Walk 50 feet activity   Assist    Assist level: Minimal Assistance - Patient > 75% Assistive device: No Device    Walk 150 feet activity   Assist    Assist level: Minimal Assistance - Patient > 75% Assistive device: No Device    Walk 10 feet on uneven surface  activity   Assist     Assist level:  Minimal Assistance - Patient > 75% Assistive device: Hand held assist   Wheelchair     Assist Will patient use wheelchair at discharge?: No(Patient is a functional ambulator)   Wheelchair activity did not occur: N/A         Wheelchair 50 feet with 2 turns activity    Assist    Wheelchair 50 feet with 2 turns activity did not occur: N/A       Wheelchair 150 feet activity     Assist  Wheelchair 150 feet activity did not occur: N/A       Blood pressure (!) 90/48, pulse (!) 103, temperature 98.2 F (36.8 C), resp. rate 19, SpO2 97 %.  Medical Problem List and Plan: 1.Decreased  functional ability with diffuse weaknesssecondary to multiple sclerosis. IV Solu-Medrol completed. Plan follow-up outpatient Dr. Epimenio Foot. -patient may  shower -ELOS/Goals: 9-12d 2. Antithrombotics: -DVT/anticoagulation:Lovenox.Check vascular study -antiplatelet therapy: N/A 3. Pain Management:Tylenol as needed 4. Mood:Provide emotional support -antipsychotic agents: N/A 5. Neuropsych: This patientiscapable of making decisions on hisown behalf. 6. Skin/Wound Care:Routine skin checks 7. Fluids/Electrolytes/Nutrition:Routine in and outs with follow-up chemistries 8. High-level functioning autism. Patient appears to be at baseline. Will discuss with mother and family. 9. Tachycardia  3/16- running 100-160s depending on exertion- could just be due to deconditioning and Solumedrol- will monitor closely- might need low dose B blocker 10. Leukocytosis  3/16- WBC down to 13.4 from 14.6- due to solumedrol- will monitor for getting worse or Sx's of illness.  11. Hypotension  3/16- BP 90/48- is 23 and says eats a low salt diet- not sure if true- has a lot of snacks in room- however BP usually lower in young pt's- asymptomatic- so again, will monitor 12. Dizziness- due to nystagmus- working with OT/PT on vestibular eval.        LOS: 1 days A FACE TO FACE EVALUATION WAS PERFORMED  Skyah Hannon 12/18/2019, 4:09 PM

## 2019-12-18 NOTE — Progress Notes (Signed)
This nurse spoke to patient regarding receiving flu vaccine. Patient stated he would like his mother to know. Attempted to reach patient's mother regarding administration of flu vaccine. Unable to reach or leave message at this time. Will attempt later in day.

## 2019-12-18 NOTE — Progress Notes (Signed)
Bilateral lower extremity venous duplex complete.  Please see CV proc tab for preliminary results. Levin Bacon- RDMS, RVT 5:15 PM  12/18/2019

## 2019-12-19 ENCOUNTER — Inpatient Hospital Stay (HOSPITAL_COMMUNITY): Payer: Medicaid Other

## 2019-12-19 ENCOUNTER — Inpatient Hospital Stay (HOSPITAL_COMMUNITY): Payer: Medicaid Other | Admitting: Occupational Therapy

## 2019-12-19 NOTE — Progress Notes (Signed)
Social Work Patient ID: Jaime [name], male   DOB: 1996/07/09, 24 y.o.   MRN: [id number]   SW left message for [name]/Financial counselor [phone]) to verify pt Medicaid status as the [facility] acct shows Medicaid information as 'entered.' SW waiting on follow-up.   SW met with pt and pt mother in room to provide updates from team conference, and anticipated d/c date 12/26/2019. SW informed pt mother on above with regard to Medicaid. She states she has been waiting on updates from pt assigned Medicaid Worker at [facility] to determine if Medicaid is active.    Loralee Pacas, MSW, [place] Office: [phone] Cell: [phone] Fax: [phone]

## 2019-12-19 NOTE — Progress Notes (Signed)
Physical Therapy Session Note  Patient Details  Name: Jaime [name] MRN: [id number] Date of Birth: 09-19-1996  Today's Date: 12/19/2019 PT Individual Time: 4944-9675 PT Individual Time Calculation (min): 45 min   Short Term Goals: Week 1:  PT Short Term Goal 1 (Week 1): STG=LTG due to short ELOS.  Skilled Therapeutic Interventions/Progress Updates:     0800 Patient in bed asleep upon PT arrival. Patient reported that he was very tired and that he usually does not get up until 8 am at home. Discussed starting therapy schedule at 9am, patient agreed that this would be better, scheduling made aware. Set patient up to eat breakfast sitting in the bed with HOB elevated. Patient in bed with all needs in reach upon PT departure.  [phone] Patient in bed finishing breakfast upon PT arrival. Discussed questions about yesterday's therapies and patient reported mild dizziness x1 this morning prior to PT. Patient denied pain during session. HR 71 bpm at beginning of session.  Therapeutic Activity: Bed Mobility: Patient performed supine to/from sit with supervision for safety, reported brief dizziness when sitting up.  Transfers: Patient performed sit to/from stand from the bed, toilet with use of grab bar, and w/c with supervision for safety. Provided verbal cues for hand placement to push-up and reach back. Patient was continent of bowl and bladder on the toilet and performed peri-care and LB dressing independently.   Gait Training:  Patient ambulated 10 feet to/from the bathroom and 55 feet without an AD with min A for balance. Ambulated with ataxic gait with wide BOS and variable foot placement. Provided verbal cues for forward propulsion and consistent foot placement using tiles. Patient's HR 161 bpm after walking back from the bathroom. Second gait trial with pulse-ox on throughout, HR variable ranging from 152 dropping to 109, and increasing to 138. Manual pulse with patient in sitting 101 bpm  with variable rhythm. Patient returned to the room via w/c and transferred back to bed. Vitals in supine: HR 115, BP 89/64.   Patient in bed handed off toStephanie, OT at end of session with breaks locked and all needs within reach. RN made aware of vitals after session.   Therapy Documentation Precautions:  Precautions Precautions: Fall Restrictions Weight Bearing Restrictions: No General: PT Amount of Missed Time (min): 15 Minutes PT Missed Treatment Reason: Patient fatigue    Therapy/Group: Individual Therapy  Jaleesa Cervi L Iridessa Harrow PT, DPT  12/19/2019, 12:16 PM

## 2019-12-19 NOTE — Progress Notes (Signed)
Occupational Therapy Session Note  Patient Details  Name: Jaime [name] MRN: [id number] Date of Birth: 1995-12-16  Today's Date: 12/19/2019 OT Individual Time: 1304-1400 OT Individual Time Calculation (min): 56 min    Short Term Goals: Week 1:  OT Short Term Goal 1 (Week 1): STG = LTG  Skilled Therapeutic Interventions/Progress Updates:    Pt received in the recliner. Pt navigated around the room with min guard throughout session. Pt reports some dizziness with initial standing. Pt showered sit to stand with contact guard- completing all parts. Pt dressed sit to stand with setup A. Pt did require A to don TEDS. Pt ambulated from room to ortho gym at a slow pace . Pt encouraged to turn head walking down the hallway to locate items to continue to work on head/eye coordination. Pt with HR in session 110-135bpm. Continued focus on eye/ head coordination with catching and throwing different sized ball. Pt with difficulty with maintaining eye stabilization with head movements; especially with head shifts to the right. (No difficulty noted with use of small electronic devices).  Return to the room and left resting in the recliner.  Therapy Documentation Precautions:  Precautions Precautions: Fall Restrictions Weight Bearing Restrictions: No General:   Vital Signs: Therapy Vitals Temp: 98 F (36.7 C) Temp Source: Oral Pulse Rate: 99 Resp: 16 BP: 99/62 Patient Position (if appropriate): Sitting Oxygen Therapy SpO2: 99 % O2 Device: Room Air Pain:  no indications of pain   Therapy/Group: Individual Therapy  Roney Mans [facility] 12/19/2019, 8:53 PM

## 2019-12-19 NOTE — Care Management (Signed)
[facility] Individual Statement of Services  Patient Name:  Jaime [name]  Date:  12/19/2019  Welcome to the [facility].  Our goal is to provide you with an individualized program based on your diagnosis and situation, designed to meet your specific needs.  With this comprehensive rehabilitation program, you will be expected to participate in at least 3 hours of rehabilitation therapies Monday-Friday, with modified therapy programming on the weekends.  Your rehabilitation program will include the following services:  Physical Therapy (PT), Occupational Therapy (OT), Speech Therapy (ST), 24 hour per day rehabilitation nursing, Therapeutic Recreaction (TR), Neuropsychology, Case Management (Social Worker), Rehabilitation Medicine, Nutrition Services, Pharmacy Services and Other  Weekly team conferences will be held on Tuesdays to discuss your progress.  Your Social Worker will talk with you frequently to get your input and to update you on team discussions.  Team conferences with you and your family in attendance may also be held.  Expected length of stay: 7-10 days    Overall anticipated outcome: Independent  Depending on your progress and recovery, your program may change. Your Social Worker will coordinate services and will keep you informed of any changes. Your Social Worker's name and contact numbers are listed  below.  The following services may also be recommended but are not provided by the [facility]:  Driving Evaluations Home Health Rehabiltiation Services Outpatient Rehabilitation Services Vocational Rehabilitation   Arrangements will be made to provide these services after discharge if needed.  Arrangements include referral to agencies that provide these services.  Your insurance has been verified to be:  Medicaid   Your primary doctor is:  No PCP  Pertinent information will be shared with your doctor and your  insurance company.  Social Worker:  Cecile Sheerer, LCSWA  Information discussed with and copy given to patient by: Gretchen Short, 12/19/2019, 12:01 PM

## 2019-12-19 NOTE — Progress Notes (Signed)
[facility] PHYSICAL MEDICINE & REHABILITATION PROGRESS NOTE   Subjective/Complaints:  Pt reports still passing gas- no BM- per nursing LBM was 3/11- so 6 days ago.   Nursing bringing him miralax AND Sorbitol this AM- pt reports willing to take both, but very sleepy this AM.     ROS:   Pt denies SOB, abd pain, CP, N/V/C/D, and vision changes   Objective:   VAS [place] LOWER EXTREMITY VENOUS (DVT)  Result Date: 12/18/2019  Lower Venous DVTStudy Indications: Fall, and Swelling.  Performing Technologist: Levada Schilling RDMS, RVT  Examination Guidelines: A complete evaluation includes B-mode imaging, spectral Doppler, color Doppler, and power Doppler as needed of all accessible portions of each vessel. Bilateral testing is considered an integral part of a complete examination. Limited examinations for reoccurring indications may be performed as noted. The reflux portion of the exam is performed with the patient in reverse Trendelenburg.  +---------+---------------+---------+-----------+----------+--------------+ RIGHT    CompressibilityPhasicitySpontaneityPropertiesThrombus Aging +---------+---------------+---------+-----------+----------+--------------+ CFV      Full           Yes      Yes                                 +---------+---------------+---------+-----------+----------+--------------+ SFJ      Full                                                        +---------+---------------+---------+-----------+----------+--------------+ FV Prox  Full                                                        +---------+---------------+---------+-----------+----------+--------------+ FV Mid   Full                                                        +---------+---------------+---------+-----------+----------+--------------+ FV DistalFull                                                         +---------+---------------+---------+-----------+----------+--------------+ PFV      Full                                                        +---------+---------------+---------+-----------+----------+--------------+ POP      Full           Yes      Yes                                 +---------+---------------+---------+-----------+----------+--------------+ PTV      Full                                                        +---------+---------------+---------+-----------+----------+--------------+  PERO     Full                                                        +---------+---------------+---------+-----------+----------+--------------+ GSV      Full                                                        +---------+---------------+---------+-----------+----------+--------------+   +---------+---------------+---------+-----------+----------+--------------+ LEFT     CompressibilityPhasicitySpontaneityPropertiesThrombus Aging +---------+---------------+---------+-----------+----------+--------------+ CFV      Full           Yes      Yes                                 +---------+---------------+---------+-----------+----------+--------------+ SFJ      Full                                                        +---------+---------------+---------+-----------+----------+--------------+ FV Prox  Full                                                        +---------+---------------+---------+-----------+----------+--------------+ FV Mid   Full                                                        +---------+---------------+---------+-----------+----------+--------------+ FV DistalFull                                                        +---------+---------------+---------+-----------+----------+--------------+ PFV      Full                                                         +---------+---------------+---------+-----------+----------+--------------+ POP      Full           Yes      Yes                                 +---------+---------------+---------+-----------+----------+--------------+ PTV      Full                                                        +---------+---------------+---------+-----------+----------+--------------+  PERO     Full                                                        +---------+---------------+---------+-----------+----------+--------------+ GSV      Full                                                        +---------+---------------+---------+-----------+----------+--------------+     Summary: BILATERAL: - No evidence of deep vein thrombosis seen in the lower extremities, bilaterally.  RIGHT: - No cystic structure found in the popliteal fossa.  LEFT: - No cystic structure found in the popliteal fossa.  *See table(s) above for measurements and observations. Electronically signed by Harold Barban MD on 12/18/2019 at 9:41:53 PM.    Final    Recent Labs    12/17/19 0420 12/18/19 0409  WBC 14.6* 13.4*  HGB 14.2 15.0  HCT 40.8 43.8  PLT 211 216   Recent Labs    12/17/19 0420 12/18/19 0409  NA 139 139  K 3.9 3.9  CL 100 102  CO2 27 28  GLUCOSE 94 96  BUN 15 16  CREATININE 1.00 1.00  CALCIUM 8.7* 8.7*    Intake/Output Summary (Last 24 hours) at 12/19/2019 1006 Last data filed at 12/19/2019 0845 Gross per 24 hour  Intake 120 ml  Output 1825 ml  Net -1705 ml     Physical Exam: Vital Signs Blood pressure 101/71, pulse 71, temperature 98.4 F (36.9 C), resp. rate 16, SpO2 98 %.  General: initially asleep- woke to verbal stimuli but very sedated/lethargic, NAD Psychiatric:very slowed processing- even more than normal, since just awake Heart: RRR this AM at rest- rate 80s when I listened Lungs: CTA B/L- no accessory muscle use Abdomen: Soft, NT, ND, (+)BS  Extremities: No clubbing, cyanosis, or  edema Skin: No evidence of breakdown, no evidence of rash Neurologic: Saw nystagmus again this AM when opened eyes , motor strength is 4/5 in bilateral deltoid, bicep, tricep, grip, hip flexor, knee extensors, ankle dorsiflexor and plantar flexor Sensory exam normal sensation to light touch  in bilateral upper and lower extremities Cerebellar exam minimal dysmetria finger to nose to finger as well as heel to shin in bilateral upper and lower extremities Musculoskeletal: Full range of motion in all 4 extremities. No joint swelling     Assessment/Plan: 1. Functional deficits secondary to  Likely initial MS exacerbation which require 3+ hours per day of interdisciplinary therapy in a comprehensive inpatient rehab setting.  Physiatrist is providing close team supervision and 24 hour management of active medical problems listed below.  Physiatrist and rehab team continue to assess barriers to discharge/monitor patient progress toward functional and medical goals  Care Tool:  Bathing    Body parts bathed by patient: Right arm, Left arm, Chest, Abdomen, Front perineal area, Buttocks, Right upper leg, Left upper leg, Right lower leg, Left lower leg, Face         Bathing assist Assist Level: Minimal Assistance - Patient > 75%     Upper Body Dressing/Undressing Upper body dressing   What is the patient wearing?: Pull over shirt    Upper body assist  Assist Level: Minimal Assistance - Patient > 75%    Lower Body Dressing/Undressing Lower body dressing      What is the patient wearing?: Underwear/pull up, Pants     Lower body assist Assist for lower body dressing: Minimal Assistance - Patient > 75%     Toileting Toileting    Toileting assist Assist for toileting: Minimal Assistance - Patient > 75%     Transfers Chair/bed transfer  Transfers assist     Chair/bed transfer assist level: Contact Guard/Touching assist     Locomotion Ambulation   Ambulation assist       Assist level: Minimal Assistance - Patient > 75% Assistive device: No Device Max distance: 155'   Walk 10 feet activity   Assist     Assist level: Minimal Assistance - Patient > 75% Assistive device: No Device   Walk 50 feet activity   Assist    Assist level: Minimal Assistance - Patient > 75% Assistive device: No Device    Walk 150 feet activity   Assist    Assist level: Minimal Assistance - Patient > 75% Assistive device: No Device    Walk 10 feet on uneven surface  activity   Assist     Assist level: Minimal Assistance - Patient > 75% Assistive device: Hand held assist   Wheelchair     Assist Will patient use wheelchair at discharge?: No(Patient is a functional ambulator)   Wheelchair activity did not occur: N/A         Wheelchair 50 feet with 2 turns activity    Assist    Wheelchair 50 feet with 2 turns activity did not occur: N/A       Wheelchair 150 feet activity     Assist  Wheelchair 150 feet activity did not occur: N/A       Blood pressure 101/71, pulse 71, temperature 98.4 F (36.9 C), resp. rate 16, SpO2 98 %.  Medical Problem List and Plan: 1.Decreased functional ability with diffuse weaknesssecondary to multiple sclerosis. IV Solu-Medrol completed. Plan follow-up outpatient Dr. Epimenio Foot. -patient may  shower -ELOS/Goals: 9-12d 2. Antithrombotics: -DVT/anticoagulation:Lovenox.Check vascular study 3/17- Dopplers (-) -antiplatelet therapy: N/A 3. Pain Management:Tylenol as needed 4. Mood:Provide emotional support -antipsychotic agents: N/A 5. Neuropsych: This patientiscapable of making decisions on hisown behalf. 6. Skin/Wound Care:Routine skin checks 7. Fluids/Electrolytes/Nutrition:Routine in and outs with follow-up chemistries 8. High-level functioning autism. Patient appears to be at baseline. Will discuss with mother and family. 9.  Tachycardia  3/16- running 100-160s depending on exertion- could just be due to deconditioning and Solumedrol- will monitor closely- might need low dose B blocker  3/17- much improved at rest this AM- 70s-80s- will con't to monitor 10. Leukocytosis  3/16- WBC down to 13.4 from 14.6- due to solumedrol- will monitor for getting worse or Sx's of illness.  11. Hypotension  3/16- BP 90/48- is 23 and says eats a low salt diet- not sure if true- has a lot of snacks in room- however BP usually lower in young pt's- asymptomatic- so again, will monitor  3/17- BP 101/71- better- con't to monitor- asymptomatic.  12. Dizziness- due to nystagmus- working with OT/PT on vestibular eval.  13. Constipation  3/17- LBM 6 days ago- doesn't feel constipated- will give Sorbitol and Miralax- if doesn't work, will need Mg citrate.         LOS: 2 days A FACE TO FACE EVALUATION WAS PERFORMED  Lecil Tapp 12/19/2019, 10:06 AM

## 2019-12-19 NOTE — Plan of Care (Signed)
  Problem: Consults Goal: RH GENERAL PATIENT EDUCATION Description: See Patient Education module for education specifics. Outcome: Progressing Goal: Skin Care Protocol Initiated - if Braden Score 18 or less Description: If consults are not indicated, leave blank or document N/A Outcome: Progressing Goal: Nutrition Consult-if indicated Outcome: Progressing   Problem: RH BOWEL ELIMINATION Goal: RH STG MANAGE BOWEL WITH ASSISTANCE Description: STG Manage Bowel with mod I  Outcome: Progressing Goal: RH STG MANAGE BOWEL W/MEDICATION W/ASSISTANCE Description: STG Manage Bowel with Medication with mod Assistance. Outcome: Progressing   Problem: RH BLADDER ELIMINATION Goal: RH STG MANAGE BLADDER WITH ASSISTANCE Description: STG Manage Bladder With Assistance Outcome: Progressing Goal: RH STG MANAGE BLADDER WITH MEDICATION WITH ASSISTANCE Description: STG Manage Bladder With Medication With Assistance. Outcome: Progressing   Problem: RH SKIN INTEGRITY Goal: RH STG SKIN FREE OF INFECTION/BREAKDOWN Outcome: Progressing Goal: RH STG MAINTAIN SKIN INTEGRITY WITH ASSISTANCE Description: STG Maintain Skin Integrity With Assistance. Outcome: Progressing   Problem: RH SAFETY Goal: RH STG ADHERE TO SAFETY PRECAUTIONS W/ASSISTANCE/DEVICE Description: STG Adhere to Safety Precautions With Assistance/Device. Outcome: Progressing Goal: RH STG DECREASED RISK OF FALL WITH ASSISTANCE Description: STG Decreased Risk of Fall With Assistance. Outcome: Progressing   Problem: RH PAIN MANAGEMENT Goal: RH STG PAIN MANAGED AT OR BELOW PT'S PAIN GOAL Outcome: Progressing   Problem: RH KNOWLEDGE DEFICIT GENERAL Goal: RH STG INCREASE KNOWLEDGE OF SELF CARE AFTER HOSPITALIZATION Outcome: Progressing   Problem: RH Vision Goal: RH LTG Vision (Specify) Outcome: Progressing   

## 2019-12-19 NOTE — Progress Notes (Signed)
Occupational Therapy Session Note  Patient Details  Name: Jaime [name] MRN: [id number] Date of Birth: [birth year]  Today's Date: 12/19/2019 OT Individual Time: 0945-1100 OT Individual Time Calculation (min): 75 min    Short Term Goals: Week 1:  OT Short Term Goal 1 (Week 1): STG = LTG  Skilled Therapeutic Interventions/Progress Updates:    1:1. Pt received in w/c with PT present reporting erratic heart rate with ambulation this morning. Pt agreeable to ADL from seated level for energy conservaiton and HR monitoring. Pt completes oral care with set up seated in w/c HR after: 132 BPM, and OT elects to delay further ADL d/t high HR. Seated vision tracking/gaze stabilization exercises completed for habiliation with R nystagmus with quick head turns. Pt endorses dizzy symptoms ~50% of trials. No nystagmus or symptoms with vertical head movement. Vitals after visual scanning: 94/64 and HR 126 seated and O2 98z% Pt completes tabletop coordination/FMC activity with coins working palm<>finger translation of coins tabletop>palm>bank with frequent dropping with RUE>LUE during palm>finger. Pt required to scan on table top to locate all coins R/L reaching with BUE.Pt complete stand pivot transfer and 10' functional mobility from toilet to recliner with MIN HHA. HR 120 after mobility. Exited session with pt seated in recliner, exit alarm on and call lighti nreach  Therapy Documentation Precautions:  Precautions Precautions: Fall Restrictions Weight Bearing Restrictions: No General:   Vital Signs: Therapy Vitals Temp: 98.4 F (36.9 C) Pulse Rate: 71 Resp: 16 BP: 101/71 Patient Position (if appropriate): Lying Oxygen Therapy SpO2: 98 % O2 Device: Room Air Pain:   ADL: ADL Eating: Independent Where Assessed-Eating: Chair Grooming: Supervision/safety, Contact guard Where Assessed-Grooming: Standing at sink Upper Body Bathing: Supervision/safety Where Assessed-Upper Body Bathing:  Shower Lower Body Bathing: Minimal assistance Where Assessed-Lower Body Bathing: Shower Upper Body Dressing: Minimal assistance Where Assessed-Upper Body Dressing: Chair Lower Body Dressing: Minimal assistance Where Assessed-Lower Body Dressing: Chair Toileting: Minimal assistance Where Assessed-Toileting: Teacher, adult education: Curator Method: Proofreader: Acupuncturist: Insurance underwriter Method: Designer, industrial/product: Grab bars, Manufacturing systems engineer    Praxis   Exercises:   Other Treatments:     Therapy/Group: Individual Therapy  Shon Hale 12/19/2019, 9:55 AM

## 2019-12-20 ENCOUNTER — Inpatient Hospital Stay (HOSPITAL_COMMUNITY): Payer: Medicaid Other | Admitting: Occupational Therapy

## 2019-12-20 ENCOUNTER — Inpatient Hospital Stay (HOSPITAL_COMMUNITY): Payer: Medicaid Other

## 2019-12-20 DIAGNOSIS — I951 Orthostatic hypotension: Secondary | ICD-10-CM

## 2019-12-20 DIAGNOSIS — G903 Multi-system degeneration of the autonomic nervous system: Secondary | ICD-10-CM

## 2019-12-20 DIAGNOSIS — R000 Tachycardia, unspecified: Secondary | ICD-10-CM

## 2019-12-20 DIAGNOSIS — I313 Pericardial effusion (noninflammatory): Secondary | ICD-10-CM

## 2019-12-20 LAB — ECHOCARDIOGRAM COMPLETE

## 2019-12-20 NOTE — Consult Note (Signed)
Cardiology Consultation:   Patient ID: Jaime [name]; [id number]; May 22, 1996   Admit date: 12/17/2019 Date of Consult: 12/20/2019  Primary Care Provider: Patient, No Pcp Per Primary Cardiologist: New to [facility]; Dr. Mayford Knife Primary Electrophysiologist:  None   Patient Profile:   Jaime [name] is a 24 y.o. male with a PMH of autism and depression, who was recently admitted to [facility] for new diagnosis of MS, who is being seen today for the evaluation of tachycardia and hypotension at the request of Dr. Berline Chough.  History of Present Illness:   Jaime [name] was admitted to [facility] from 12/11/19-12/17/19 for the evaluation of progressive weakness and falls. He underwent an extensive neurologic work-up and was found to have an MS exacerbation in a patient with previously undiagnosed MS. He was treated with IV steroids with plans to follow-up outpatient for initiation of disease modifying therapy. He was discharged to [facility] 3/15. While working with PT over the past several days he has been noted to have a HR in the 150s-170s with ambulation. BP's have been soft in the 90s/50s limiting management. Cardiology was consulted for tachycardia and hypotension.  Patient has not prior cardiac history and has never seen a cardiologist before. He has had no prior cardiac work up.   At the time of this evaluation he feels fine. He does note his heart racing when he is up walking around which was happening prior to admission. He has occasional lightheadedness with position changes. No complaints of chest pain, SOB, LE edema, palpitations, dizziness, or syncope. He does not know his family history.    Past Medical History:  Diagnosis Date  . Autism   . Depression     History reviewed. No pertinent surgical history.   Home Medications:  Prior to Admission medications   Not on File    Inpatient Medications: Scheduled Meds: . enoxaparin (LOVENOX) injection  40 mg Subcutaneous Q24H  .  pantoprazole  40 mg Oral Daily   Continuous Infusions:  PRN Meds: acetaminophen **OR** acetaminophen, ondansetron **OR** ondansetron (ZOFRAN) IV, sorbitol  Allergies:   No Known Allergies  Social History:   Social History   Socioeconomic History  . Marital status: Single    Spouse name: Not on file  . Number of children: Not on file  . Years of education: Not on file  . Highest education level: Not on file  Occupational History  . Not on file  Tobacco Use  . Smoking status: Never Smoker  . Smokeless tobacco: Never Used  Substance and Sexual Activity  . Alcohol use: No  . Drug use: Never  . Sexual activity: Not Currently  Other Topics Concern  . Not on file  Social History Narrative  . Not on file   Social Determinants of Health   Financial Resource Strain:   . Difficulty of Paying Living Expenses:   Food Insecurity:   . Worried About Programme researcher, broadcasting/film/video in the Last Year:   . Barista in the Last Year:   Transportation Needs:   . Freight forwarder (Medical):   Marland Kitchen Lack of Transportation (Non-Medical):   Physical Activity:   . Days of Exercise per Week:   . Minutes of Exercise per Session:   Stress:   . Feeling of Stress :   Social Connections:   . Frequency of Communication with Friends and Family:   . Frequency of Social Gatherings with Friends and Family:   . Attends Religious Services:   .  Active Member of Clubs or Organizations:   . Attends Archivist Meetings:   Marland Kitchen Marital Status:   Intimate Partner Violence:   . Fear of Current or Ex-Partner:   . Emotionally Abused:   Marland Kitchen Physically Abused:   . Sexually Abused:     Family History:    Family History  Problem Relation Age of Onset  . Clotting disorder Mother      ROS:  Please see the history of present illness.  ROS  All other ROS reviewed and negative.     Physical Exam/Data:   Vitals:   12/19/19 1045 12/19/19 1438 12/19/19 2038 12/20/19 0509  BP: 100/71 102/66 99/62 (!)  98/56  Pulse: (!) 105 (!) 104 99 69  Resp:  18 16 17   Temp:  98.7 F (37.1 C) 98 F (36.7 C) 97.8 F (36.6 C)  TempSrc:   Oral Oral  SpO2:  98% 99% 98%    Intake/Output Summary (Last 24 hours) at 12/20/2019 1357 Last data filed at 12/20/2019 1313 Gross per 24 hour  Intake 600 ml  Output 1300 ml  Net -700 ml   There were no vitals filed for this visit. There is no height or weight on file to calculate BMI.  General:  Well nourished, well developed, in no acute distress HEENT: sclera anicteric  Lymph: no adenopathy Neck: no JVD Endocrine:  No thryomegaly Vascular: No carotid bruits; distal pulses 2+ bilaterally Cardiac:  normal S1, S2; RRR; no murmur, gallops, or rubs Lungs:  clear to auscultation bilaterally, no wheezing, rhonchi or rales  Abd: NABS, soft, nontender, no hepatomegaly Ext: no edema; compression stockings on Musculoskeletal:  No deformities, BUE and BLE strength normal and equal Skin: warm and dry  Neuro:  CNs 2-12 intact, no focal abnormalities noted Psych:  Flat affect   EKG:  The EKG was personally reviewed and demonstrates:  Sinus tachycardia, rate 108, early repolarization, isolated TWI in aVL.  Telemetry:  Telemetry was personally reviewed and demonstrates:  Not on telemetry  Relevant CV Studies: None  Laboratory Data:  Chemistry Recent Labs  Lab 12/16/19 0540 12/17/19 0420 12/18/19 0409  NA 138 139 139  K 4.0 3.9 3.9  CL 106 100 102  CO2 27 27 28   GLUCOSE 112* 94 96  BUN 14 15 16   CREATININE 0.87 1.00 1.00  CALCIUM 8.8* 8.7* 8.7*  GFRNONAA >60 >60 >60  GFRAA >60 >60 >60  ANIONGAP 5 12 9     Recent Labs  Lab 12/16/19 0540 12/17/19 0420 12/18/19 0409  PROT 5.8* 5.9* 6.0*  ALBUMIN 3.3* 3.3* 3.3*  AST 19 18 17   ALT 14 15 18   ALKPHOS 34* 37* 33*  BILITOT 0.5 0.4 0.8   Hematology Recent Labs  Lab 12/16/19 0540 12/17/19 0420 12/18/19 0409  WBC 19.9* 14.6* 13.4*  RBC 4.29 4.50 4.73  HGB 13.5 14.2 15.0  HCT 39.2 40.8 43.8  MCV  91.4 90.7 92.6  MCH 31.5 31.6 31.7  MCHC 34.4 34.8 34.2  RDW 13.4 13.3 13.3  PLT 201 211 216   Cardiac EnzymesNo results for input(s): TROPONINI in the last 168 hours. No results for input(s): TROPIPOC in the last 168 hours.  BNPNo results for input(s): BNP, PROBNP in the last 168 hours.  DDimer No results for input(s): DDIMER in the last 168 hours.  Radiology/Studies:  MR BRAIN W WO CONTRAST  Result Date: 12/16/2019 CLINICAL DATA:  Demyelinating disease. Abnormal MRI of the head 12/11/2019. Interval treatment with IV steroid. EXAM: MRI  HEAD WITHOUT AND WITH CONTRAST TECHNIQUE: Multiplanar, multiecho pulse sequences of the brain and surrounding structures were obtained without and with intravenous contrast. CONTRAST:  5.22mL GADAVIST GADOBUTROL 1 MMOL/ML IV SOLN COMPARISON:  MR the head without and with contrast 12/11/2019 FINDINGS: Brain: Numerous periventricular and subcortical T2 hyperintensities bilaterally are slightly less well defined than on the prior exam. No new lesions or expansion are seen. Diffusion signal is mostly secondary to shine through. No significant restricted diffusion is present. Postcontrast imaging demonstrates no residual enhancement of the numerous T2 hyperintense lesions. The ventricles are of normal size. No significant extraaxial fluid collection is present. The internal auditory canals are within normal limits. Vascular: Flow is present in the major intracranial arteries. Skull and upper cervical spine: The craniocervical junction is normal. Upper cervical spine is within normal limits. Marrow signal is unremarkable. Sinuses/Orbits: The paranasal sinuses and mastoid air cells are clear. The globes and orbits are within normal limits. IMPRESSION: 1. Innumerable scattered white matter lesions are similar in size and number to the prior exam. The lesions are slightly less well-defined than on the prior study. 2. Previously seen enhancement is no longer present. This likely  represents some response to the interval therapy. 3. No new lesions or evidence for ongoing active demyelination. Electronically Signed   By: Marin Roberts M.D.   On: 12/16/2019 19:33   MR THORACIC SPINE W WO CONTRAST  Result Date: 12/16/2019 CLINICAL DATA:  Diffuse demyelination. Follow-up after steroid therapy. EXAM: MRI THORACIC WITHOUT AND WITH CONTRAST TECHNIQUE: Multiplanar and multiecho pulse sequences of the thoracic spine were obtained without and with intravenous contrast. CONTRAST:  5.51mL GADAVIST GADOBUTROL 1 MMOL/ML IV SOLN COMPARISON:  MRI of the thoracic spine 12/11/19 FINDINGS: MRI THORACIC SPINE FINDINGS Alignment:  No significant listhesis is present. Vertebrae: Marrow signal and vertebral body heights are normal. Cord: Faint enhancement on the right at T1-2 is near completely resolved. No enhancement remains in the lower thoracic spine. Numerous T2 hyperintensities within the cord slightly less well defined than on the prior exam. No new lesions are present. The cord is not expanded. Paraspinal and other soft tissues: Paraspinous soft tissues are within limits. Lungs are clear. Disc levels: Focal disc disease at T4-5 and T6-7 is again noted. The central protrusion at T6-7 contacts and slightly distorts the ventral surface the cord. Foramina are patent bilaterally. IMPRESSION: 1. Extensive T2 lesions throughout the thoracic spinal cord is slightly less well-defined than on the prior exam. 2. Previously noted enhancement at the T1-2 level is near completely resolved. This likely reflects response to steroid treatment. 3. No evidence for disease progression. Electronically Signed   By: Marin Roberts M.D.   On: 12/16/2019 19:39   VAS [place] LOWER EXTREMITY VENOUS (DVT)  Result Date: 12/18/2019  Lower Venous DVTStudy Indications: Fall, and Swelling.  Performing Technologist: Levada Schilling RDMS, RVT  Examination Guidelines: A complete evaluation includes B-mode imaging, spectral  Doppler, color Doppler, and power Doppler as needed of all accessible portions of each vessel. Bilateral testing is considered an integral part of a complete examination. Limited examinations for reoccurring indications may be performed as noted. The reflux portion of the exam is performed with the patient in reverse Trendelenburg.  +---------+---------------+---------+-----------+----------+--------------+ RIGHT    CompressibilityPhasicitySpontaneityPropertiesThrombus Aging +---------+---------------+---------+-----------+----------+--------------+ CFV      Full           Yes      Yes                                 +---------+---------------+---------+-----------+----------+--------------+  SFJ      Full                                                        +---------+---------------+---------+-----------+----------+--------------+ FV Prox  Full                                                        +---------+---------------+---------+-----------+----------+--------------+ FV Mid   Full                                                        +---------+---------------+---------+-----------+----------+--------------+ FV DistalFull                                                        +---------+---------------+---------+-----------+----------+--------------+ PFV      Full                                                        +---------+---------------+---------+-----------+----------+--------------+ POP      Full           Yes      Yes                                 +---------+---------------+---------+-----------+----------+--------------+ PTV      Full                                                        +---------+---------------+---------+-----------+----------+--------------+ PERO     Full                                                        +---------+---------------+---------+-----------+----------+--------------+ GSV      Full                                                         +---------+---------------+---------+-----------+----------+--------------+   +---------+---------------+---------+-----------+----------+--------------+ LEFT     CompressibilityPhasicitySpontaneityPropertiesThrombus Aging +---------+---------------+---------+-----------+----------+--------------+ CFV      Full           Yes      Yes                                 +---------+---------------+---------+-----------+----------+--------------+  SFJ      Full                                                        +---------+---------------+---------+-----------+----------+--------------+ FV Prox  Full                                                        +---------+---------------+---------+-----------+----------+--------------+ FV Mid   Full                                                        +---------+---------------+---------+-----------+----------+--------------+ FV DistalFull                                                        +---------+---------------+---------+-----------+----------+--------------+ PFV      Full                                                        +---------+---------------+---------+-----------+----------+--------------+ POP      Full           Yes      Yes                                 +---------+---------------+---------+-----------+----------+--------------+ PTV      Full                                                        +---------+---------------+---------+-----------+----------+--------------+ PERO     Full                                                        +---------+---------------+---------+-----------+----------+--------------+ GSV      Full                                                        +---------+---------------+---------+-----------+----------+--------------+     Summary: BILATERAL: - No evidence of deep vein thrombosis seen  in the lower extremities, bilaterally.  RIGHT: - No cystic structure found in the popliteal fossa.  LEFT: - No cystic structure found in the popliteal fossa.  *See table(s) above for measurements and observations.  Electronically signed by Coral Else MD on 12/18/2019 at 9:41:53 PM.    Final     Assessment and Plan:   1. Tachycardia: presumed sinus tachycardia though no rhythm strips to confirm. Possible he has autonomic dysfunction given recent MS diagnosis. BP limits AV nodal blocking agents.  - Will place a ZIO patch monitor at this time for live telemetry monitoring - Will check an echocardiogram   2. Hypotension: he describes orthostatic symptoms.  - Will check orthostatic vitals, if positive would consider addition of midodrine - Continue compression stocking use.   3. MS: recently diagnosed. Currently admitted to [facility]. - Plan to follow-up with neurology outpatient   For questions or updates, please contact [facility] Please consult [email or web address] for contact info under Cardiology/STEMI.   Signed, Beatriz Stallion, PA-C  12/20/2019 1:57 PM [phone]

## 2019-12-20 NOTE — Progress Notes (Signed)
Social Work Patient ID: Georgann Housekeeper, male   DOB: 06-09-1996, 24 y.o.   MRN: [id number]   SW spoke with Shanda Bumps Wright/Financial counselor [phone]) to get updates on Medicaid status. Reports that his Medicaid is active.   *SW called pt mother to provide above updates.   Cecile Sheerer, MSW, LCSWA Office: [phone] Cell: [phone] Fax: [phone]

## 2019-12-20 NOTE — Progress Notes (Signed)
  Echocardiogram 2D Echocardiogram has been performed.  Jaime [name] 12/20/2019, 4:17 PM

## 2019-12-20 NOTE — Progress Notes (Signed)
EKG CRITICAL VALUE     12 lead EKG performed.  Critical value noted. Donna Bernard, RN notified.   Kaytelynn Scripter A, CCT 12/20/2019 11:47 AM

## 2019-12-20 NOTE — Progress Notes (Signed)
Occupational Therapy Session Note  Patient Details  Name: Jaime [name] MRN: [id number] Date of Birth: [birth year]  Today's Date: 12/20/2019 OT Individual Time: 1000-1100 OT Individual Time Calculation (min): 60 min    Short Term Goals: Week 1:  OT Short Term Goal 1 (Week 1): STG = LTG  Skilled Therapeutic Interventions/Progress Updates:  Pt handed off from PT seated in recliner. Pt requesting to shower this session. Pt completed functional mobility from recliner to shower with CGA as pt noted to "furniture walk" throughout room. Pt reports mild dizziness once seated in shower seat with no nystagmus noted, HR 127 bpm post mobility. Pt completed UB/LB bathing with close supervision seated in shower seat. Pt reports no dizziness during shower with HR 132 bpm post shower. Pt completed functional mobility from shower to toilet needing CGA for balance during standing toileting. Pt completed seated dressing with set- up assist overall. Pt reports dizziness upon standing for grooming needing seated rest break for dizziness to subside, however no nystagmus noted. Pt transitioned to recliner with CGA with continued "furniture walking" noted. Pt left seated in recliner with HR 118 bpm  with safety belt activated and all needs within reach.   Therapy Documentation Precautions:  Precautions Precautions: Fall Restrictions Weight Bearing Restrictions: No General:   Vital Signs:  Pain: Pt reports no pain during session.   Therapy/Group: Individual Therapy  Angelina Pih 12/20/2019, 11:33 AM

## 2019-12-20 NOTE — Progress Notes (Addendum)
[place] PHYSICAL MEDICINE & REHABILITATION PROGRESS NOTE   Subjective/Complaints:  No BM still- per pt- however per nursing, had BM with therapy yesterday. Denies feeling of constipation  Decreased blurry vision-   ETA- calling cards since found out from PT pt's HR went up to 170s with gait- asymptomatic. But can't start B blocker since BP soft.   ROS:  Pt denies SOB, abd pain, CP, N/V/C/D, and vision changes  Objective:   VAS [place] LOWER EXTREMITY VENOUS (DVT)  Result Date: 12/18/2019  Lower Venous DVTStudy Indications: Fall, and Swelling.  Performing Technologist: Levada Schilling RDMS, RVT  Examination Guidelines: A complete evaluation includes B-mode imaging, spectral Doppler, color Doppler, and power Doppler as needed of all accessible portions of each vessel. Bilateral testing is considered an integral part of a complete examination. Limited examinations for reoccurring indications may be performed as noted. The reflux portion of the exam is performed with the patient in reverse Trendelenburg.  +---------+---------------+---------+-----------+----------+--------------+ RIGHT    CompressibilityPhasicitySpontaneityPropertiesThrombus Aging +---------+---------------+---------+-----------+----------+--------------+ CFV      Full           Yes      Yes                                 +---------+---------------+---------+-----------+----------+--------------+ SFJ      Full                                                        +---------+---------------+---------+-----------+----------+--------------+ FV Prox  Full                                                        +---------+---------------+---------+-----------+----------+--------------+ FV Mid   Full                                                        +---------+---------------+---------+-----------+----------+--------------+ FV DistalFull                                                         +---------+---------------+---------+-----------+----------+--------------+ PFV      Full                                                        +---------+---------------+---------+-----------+----------+--------------+ POP      Full           Yes      Yes                                 +---------+---------------+---------+-----------+----------+--------------+ PTV      Full                                                        +---------+---------------+---------+-----------+----------+--------------+  PERO     Full                                                        +---------+---------------+---------+-----------+----------+--------------+ GSV      Full                                                        +---------+---------------+---------+-----------+----------+--------------+   +---------+---------------+---------+-----------+----------+--------------+ LEFT     CompressibilityPhasicitySpontaneityPropertiesThrombus Aging +---------+---------------+---------+-----------+----------+--------------+ CFV      Full           Yes      Yes                                 +---------+---------------+---------+-----------+----------+--------------+ SFJ      Full                                                        +---------+---------------+---------+-----------+----------+--------------+ FV Prox  Full                                                        +---------+---------------+---------+-----------+----------+--------------+ FV Mid   Full                                                        +---------+---------------+---------+-----------+----------+--------------+ FV DistalFull                                                        +---------+---------------+---------+-----------+----------+--------------+ PFV      Full                                                         +---------+---------------+---------+-----------+----------+--------------+ POP      Full           Yes      Yes                                 +---------+---------------+---------+-----------+----------+--------------+ PTV      Full                                                        +---------+---------------+---------+-----------+----------+--------------+  PERO     Full                                                        +---------+---------------+---------+-----------+----------+--------------+ GSV      Full                                                        +---------+---------------+---------+-----------+----------+--------------+     Summary: BILATERAL: - No evidence of deep vein thrombosis seen in the lower extremities, bilaterally.  RIGHT: - No cystic structure found in the popliteal fossa.  LEFT: - No cystic structure found in the popliteal fossa.  *See table(s) above for measurements and observations. Electronically signed by Harold Barban MD on 12/18/2019 at 9:41:53 PM.    Final    Recent Labs    12/18/19 0409  WBC 13.4*  HGB 15.0  HCT 43.8  PLT 216   Recent Labs    12/18/19 0409  NA 139  K 3.9  CL 102  CO2 28  GLUCOSE 96  BUN 16  CREATININE 1.00  CALCIUM 8.7*    Intake/Output Summary (Last 24 hours) at 12/20/2019 0926 Last data filed at 12/20/2019 0826 Gross per 24 hour  Intake 480 ml  Output 900 ml  Net -420 ml     Physical Exam: Vital Signs Blood pressure (!) 98/56, pulse 69, temperature 97.8 F (36.6 C), temperature source Oral, resp. rate 17, SpO2 98 %.  General: awake, appropriate, cordial, sitting up in bed with nursing giving meds, NAD Psychiatric:normal slowed processing; cordial Heart: RRR; no M/R/G Lungs: CTA B/L- no W/R/R- good air movement Abdomen: Soft, NT, ND, (+)BS  Extremities: No clubbing, cyanosis, or edema Skin: No evidence of breakdown, no evidence of rash Neurologic: Nystagmus improved greatly- saw a  little to right only , motor strength is 4/5 in bilateral deltoid, bicep, tricep, grip, hip flexor, knee extensors, ankle dorsiflexor and plantar flexor Sensory exam normal sensation to light touch  in bilateral upper and lower extremities Cerebellar exam minimal dysmetria finger to nose to finger as well as heel to shin in bilateral upper and lower extremities Musculoskeletal: Full range of motion in all 4 extremities. No joint swelling     Assessment/Plan: 1. Functional deficits secondary to  Likely initial MS exacerbation which require 3+ hours per day of interdisciplinary therapy in a comprehensive inpatient rehab setting.  Physiatrist is providing close team supervision and 24 hour management of active medical problems listed below.  Physiatrist and rehab team continue to assess barriers to discharge/monitor patient progress toward functional and medical goals  Care Tool:  Bathing    Body parts bathed by patient: Right arm, Left arm, Chest, Abdomen, Front perineal area, Buttocks, Right upper leg, Left upper leg, Right lower leg, Left lower leg, Face         Bathing assist Assist Level: Contact Guard/Touching assist     Upper Body Dressing/Undressing Upper body dressing   What is the patient wearing?: Pull over shirt    Upper body assist Assist Level: Set up assist    Lower Body Dressing/Undressing Lower body dressing      What is the patient wearing?:  Underwear/pull up, Pants     Lower body assist Assist for lower body dressing: Contact Guard/Touching assist     Toileting Toileting    Toileting assist Assist for toileting: Minimal Assistance - Patient > 75%     Transfers Chair/bed transfer  Transfers assist     Chair/bed transfer assist level: Supervision/Verbal cueing     Locomotion Ambulation   Ambulation assist      Assist level: Minimal Assistance - Patient > 75% Assistive device: No Device Max distance: 55'   Walk 10 feet  activity   Assist     Assist level: Minimal Assistance - Patient > 75% Assistive device: No Device   Walk 50 feet activity   Assist    Assist level: Minimal Assistance - Patient > 75% Assistive device: No Device    Walk 150 feet activity   Assist    Assist level: Minimal Assistance - Patient > 75% Assistive device: No Device    Walk 10 feet on uneven surface  activity   Assist     Assist level: Minimal Assistance - Patient > 75% Assistive device: Hand held assist   Wheelchair     Assist Will patient use wheelchair at discharge?: No(Patient is a functional ambulator)   Wheelchair activity did not occur: N/A         Wheelchair 50 feet with 2 turns activity    Assist    Wheelchair 50 feet with 2 turns activity did not occur: N/A       Wheelchair 150 feet activity     Assist  Wheelchair 150 feet activity did not occur: N/A       Blood pressure (!) 98/56, pulse 69, temperature 97.8 F (36.6 C), temperature source Oral, resp. rate 17, SpO2 98 %.  Medical Problem List and Plan: 1.Decreased functional ability with diffuse weaknesssecondary to multiple sclerosis. IV Solu-Medrol completed. Plan follow-up outpatient Dr. Epimenio Foot. -patient may  shower -ELOS/Goals: 9-12d 2. Antithrombotics: -DVT/anticoagulation:Lovenox.Check vascular study 3/17- Dopplers (-) -antiplatelet therapy: N/A 3. Pain Management:Tylenol as needed 4. Mood:Provide emotional support -antipsychotic agents: N/A 5. Neuropsych: This patientiscapable of making decisions on hisown behalf. 6. Skin/Wound Care:Routine skin checks 7. Fluids/Electrolytes/Nutrition:Routine in and outs with follow-up chemistries 8. High-level functioning autism. Patient appears to be at baseline. Will discuss with mother and family. 9. Tachycardia  3/16- running 100-160s depending on exertion- could just be due to deconditioning and  Solumedrol- will monitor closely- might need low dose B blocker  3/17- much improved at rest this AM- 70s-80s- will con't to monitor  3/18- rate 69 this AM- doing well 10. Leukocytosis  3/16- WBC down to 13.4 from 14.6- due to solumedrol- will monitor for getting worse or Sx's of illness.   3/18- will recheck in AM 11. Hypotension  3/16- BP 90/48- is 23 and says eats a low salt diet- not sure if true- has a lot of snacks in room- however BP usually lower in young pt's- asymptomatic- so again, will monitor  3/17- BP 101/71- better- con't to monitor- asymptomatic.  12. Dizziness- due to nystagmus- working with OT/PT on vestibular eval.   3/18- blurry vision and dizziness improved- nystagmus is improving.  13. Constipation  3/17- LBM 6 days ago- doesn't feel constipated- will give Sorbitol and Miralax- if doesn't work, will need Mg citrate.   3/18- LBM yesterday- con't meds        LOS: 3 days A FACE TO FACE EVALUATION WAS PERFORMED  Jaime [name] 12/20/2019, 9:26 AM

## 2019-12-20 NOTE — Progress Notes (Signed)
Order received to apply zio patch for cardiac monitoring. EKG tech reported zio patch to be placed only at discharge. Dr. Berline Chough notified approximately 1530. No orders received. On call amion for cardiology notified. Patient in no distress at this time. No orders received. Continue with plan of care.  Cleotilde Neer

## 2019-12-20 NOTE — Progress Notes (Signed)
Occupational Therapy Session Note  Patient Details  Name: Jaime [name] MRN: [id number] Date of Birth: 13-Sep-1996  Today's Date: 12/20/2019 OT Individual Time: 1100-1155 OT Individual Time Calculation (min): 55 min    Short Term Goals: Week 1:  OT Short Term Goal 1 (Week 1): STG = LTG  Skilled Therapeutic Interventions/Progress Updates:    Upon entering the room, pt seated in recliner chair with no c/o pain this session. Pt is agreeable to OT intervention. Pt verbalized having just taken bath prior to OT arrival. Pt verbalized playing guitar as being a leisure interest of him. Pt given standard guitar and pt trying different chords and turning guitar further with some difficulty. Pt reports it taking him longer to place fingers where they should be. Pt trying several other chords and then sitting guitar down. Pt ambulating with min A 100' without use of AD but with very ataxic gait this session. Pt needing min cuing for forward gaze and to attempt to naturally swing arms by side which did improve his gait but minimally. Pt taking seated rest break and then standing for three minutes at dynavision and able to hit 76 targets with 2 LOB forward in which pt steadied himself on dynavision. Pt taking seated rest break and then standing on airex foam. Pt able to improve speed and hitting 84 targets with B UEs without LOB. Supervision for balance during tasks and decreasing reaction time in each quadrant by .3 - .5 seconds. Pt very excited by these results. Pt ambulating back to room in same manner as above. Pt returning to recliner chair with chair alarm belt donned and activated. Call bell and all needed items within reach upon exiting the room.   [email or web address] Therapy Documentation Precautions:  Precautions Precautions: Fall Restrictions Weight Bearing Restrictions: No General:   Vital Signs: Therapy Vitals Temp: 97.9 F (36.6 C) Temp Source:  Oral Pulse Rate: (!) 106 Resp: (!) 21 BP: 113/78 Patient Position (if appropriate): Sitting Oxygen Therapy SpO2: 97 % O2 Device: Room Air Pain:   ADL: ADL Eating: Independent Where Assessed-Eating: Chair Grooming: Supervision/safety, Contact guard Where Assessed-Grooming: Standing at sink Upper Body Bathing: Supervision/safety Where Assessed-Upper Body Bathing: Shower Lower Body Bathing: Minimal assistance Where Assessed-Lower Body Bathing: Shower Upper Body Dressing: Minimal assistance Where Assessed-Upper Body Dressing: Chair Lower Body Dressing: Minimal assistance Where Assessed-Lower Body Dressing: Chair Toileting: Minimal assistance Where Assessed-Toileting: Teacher, adult education: Curator Method: Proofreader: Acupuncturist: Insurance underwriter Method: Designer, industrial/product: Grab bars, Transfer tub bench   Therapy/Group: Individual Therapy  Alen Bleacher 12/20/2019, 2:53 PM

## 2019-12-20 NOTE — Progress Notes (Signed)
Physical Therapy Session Note  Patient Details  Name: Jaime [name] MRN: [id number] Date of Birth: 10/27/1995  Today's Date: 12/20/2019 PT Individual Time: 0900-1000 PT Individual Time Calculation (min): 60 min   Short Term Goals: Week 1:  PT Short Term Goal 1 (Week 1): STG=LTG due to short ELOS.  Skilled Therapeutic Interventions/Progress Updates:     Patient in bed finishing breakfast upon PT arrival. Patient alert and agreeable to PT session. Patient denied pain during session. Patient with B knee high TED hose donned overnight pulled down around ankles, educated patient about removing TED hose at night, patient stated understanding and pulled TEDs up in long sitting in bed.  Therapeutic Activity: Bed Mobility: Patient performed supine to/from sit with independently.  Transfers: Patient performed sit to/from stand with supervision for safety/balance. Provided verbal cues for use of UEs for safety. He performed a toilet transfer in the bathroom with supervision with use of the grab bar for safety. Performed peri-care and LB dressing independently. He then stood at the sink to wash his hands with supervision, leaning on the sink for balance.   Orthostatic vitals:  Supine: BP 98/51, HR 103 (asymptomatic) Sitting: BP 110/89, HR 125 (asymptomatic) Standing: BP 97/43, HR 138 (asymptomatic) Standing x3 min: BP 102/70, HR 158 (asymptomatic) Dr. Berline Chough, MD consulted and encouraged to progress with mobility and have patient rest based on symptoms.   Gait Training:  Patient ambulated 174 feet x2 without an AD with CGA-min A. Ambulated with improved gait pattern today and increased gait speed with intermittent variable foot placement with wide BOS and mild LOB x2-3 per gait trial. Provided verbal cues for consistent step length and width and increased visual scanning. HR between 110 and 171 with ambulation, recovered to 130's in sitting.  Neuromuscular Re-ed: Patient performed the following  standing balance and VOR activities: -standing on foam x2 min with increased ankle strategies with fatigue with supervision with no LOB, no reports of dizziness in standing -standing on foam with eyes closed x10 sec with supervision and no LOB -standing on foam performing slow speed VOR x1 x2 min with moderate dizziness that resolved <10 sec in sitting -standing on foam performing moderate speed VOR x1 x2 min with moderate dizziness that resolved <10 sec in sitting -standing on foam performing VOR x1 at 180 bpm on metronome x2 min with moderate dizziness that resolved <10 sec in sitting. Noted improved eye focus on target this trial. HR between 101-142 after activities  Patient in recliner in room at end of session with breaks locked, seat belt alarm set, and all needs within reach. Educated patient on purpose of activities performed today, patient appreciative of education.    Therapy Documentation Precautions:  Precautions Precautions: Fall Restrictions Weight Bearing Restrictions: No    Therapy/Group: Individual Therapy  Jaime [name] [name] PT, DPT  12/20/2019, 4:17 PM

## 2019-12-20 NOTE — IPOC Note (Signed)
Overall Plan of Care [facility]) Patient Details Name: Jaime [name] MRN: [id number] DOB: 1996/02/14  Admitting Diagnosis: MS (multiple sclerosis) [facility])  Hospital Problems: Principal Problem:   MS (multiple sclerosis) (HCC) Active Problems:   Lower extremity weakness   Multiple sclerosis exacerbation (HCC)     Functional Problem List: Nursing Behavior, Bladder, Bowel, Endurance, Medication Management, Nutrition, Pain, Perception, Safety, Skin Integrity  PT Balance, Endurance, Motor, Nutrition, Perception, Safety  OT Balance, Endurance, Motor, Vision  SLP    TR         Basic ADL's: OT Grooming, Bathing, Toileting, Dressing     Advanced  ADL's: OT Simple Meal Preparation     Transfers: PT Bed Mobility, Bed to Chair, Car, Furniture, Floor  OT Toilet, Tub/Shower     Locomotion: PT Ambulation, Psychologist, prison and probation services, Stairs     Additional Impairments: OT Fuctional Use of Upper Extremity  SLP        TR      Anticipated Outcomes Item Anticipated Outcome  Self Feeding independent  Swallowing      Basic self-care  mod I  Toileting  mod I   Bathroom Transfers supervision  Bowel/Bladder  patient sill be continent of bowel and bladder with min assist  Transfers  Mod I  Locomotion  Mod I household, Supervision community >500 ft  Communication     Cognition     Pain  pain will be less than or equal to 4/10 with min assist  Safety/Judgment  paptient will be free from falls/injury and making appropriate safety decisions with min assist   Therapy Plan: PT Intensity: Minimum of 1-2 x/day ,45 to 90 minutes PT Frequency: 5 out of 7 days PT Duration Estimated Length of Stay: 7-10 days OT Intensity: Minimum of 1-2 x/day, 45 to 90 minutes OT Frequency: 5 out of 7 days OT Duration/Estimated Length of Stay: 7-10 days     Due to the current state of emergency, patients may not be receiving their 3-hours of Medicare-mandated therapy.   Team Interventions: Nursing  Interventions Patient/Family Education, Bladder Management, Bowel Management, Disease Management/Prevention, Pain Management, Medication Management, Skin Care/Wound Management, Cognitive Remediation/Compensation, Discharge Planning  PT interventions Ambulation/gait training, Discharge planning, Functional mobility training, Psychosocial support, Therapeutic Activities, Visual/perceptual remediation/compensation, Balance/vestibular training, Disease management/prevention, Neuromuscular re-education, Skin care/wound management, Therapeutic Exercise, DME/adaptive equipment instruction, Pain management, Splinting/orthotics, UE/LE Strength taining/ROM, Community reintegration, Development worker, international aid stimulation, Patient/family education, Museum/gallery curator, UE/LE Coordination activities  OT Interventions Warden/ranger, Neuromuscular re-education, Self Care/advanced ADL retraining, Therapeutic Exercise, DME/adaptive equipment instruction, Firefighter, Equities trader education, UE/LE Coordination activities, Discharge planning, Functional mobility training, Therapeutic Activities, Visual/perceptual remediation/compensation  SLP Interventions    TR Interventions    SW/CM Interventions Discharge Planning, Psychosocial Support, Patient/Family Education   Barriers to Discharge MD  Medical stability, Home enviroment access/loayout, Incontinence, Neurogenic bowel and bladder, Lack of/limited family support, Behavior and Austism and tachcyardia- up to 160s+ with gait- cards consult called  Nursing      PT Home environment access/layout, Decreased caregiver support, Other (comments), Medical stability 1 flight of stairs with 1 rail to enter apartment, patient's mother to be primary caregiver and has 2 other children in the home, patient with intermittnet elevated HR, and limited by dizziness with mobility  OT      SLP      SW       Team Discharge Planning: Destination: PT-Home ,OT- Home ,  SLP-  Projected Follow-up: PT-Outpatient PT, OT-  Outpatient OT, SLP-  Projected Equipment Needs: PT-To  be determined, OT- Tub/shower seat, SLP-  Equipment Details: PT-Patient has no DME at home, will determine equipment needs based on patient progress, OT-  Patient/family involved in discharge planning: PT- Patient,  OT-Patient, SLP-   MD ELOS: 7-10 days Medical Rehab Prognosis:  Good Assessment: Pt is a 24 yr old male with hx of autism here for likely initial MS exacerbation with weakness, blurry vision, balance issues and impaired function - s/p 7 days of Solumedrol.   Goals Mod I to supervision.      See Team Conference Notes for weekly updates to the plan of care

## 2019-12-21 ENCOUNTER — Encounter (HOSPITAL_COMMUNITY): Payer: Medicaid Other | Admitting: Psychology

## 2019-12-21 ENCOUNTER — Inpatient Hospital Stay (HOSPITAL_COMMUNITY): Payer: Medicaid Other | Admitting: Physical Therapy

## 2019-12-21 ENCOUNTER — Inpatient Hospital Stay (HOSPITAL_COMMUNITY): Payer: Medicaid Other | Admitting: Occupational Therapy

## 2019-12-21 DIAGNOSIS — F840 Autistic disorder: Secondary | ICD-10-CM

## 2019-12-21 LAB — CBC WITH DIFFERENTIAL/PLATELET
Abs Immature Granulocytes: 0 10*3/uL (ref 0.00–0.07)
Basophils Absolute: 0 10*3/uL (ref 0.0–0.1)
Basophils Relative: 0 %
Eosinophils Absolute: 0.4 10*3/uL (ref 0.0–0.5)
Eosinophils Relative: 3 %
HCT: 41.6 % (ref 39.0–52.0)
Hemoglobin: 14.3 g/dL (ref 13.0–17.0)
Lymphocytes Relative: 27 %
Lymphs Abs: 3.4 10*3/uL (ref 0.7–4.0)
MCH: 31.4 pg (ref 26.0–34.0)
MCHC: 34.4 g/dL (ref 30.0–36.0)
MCV: 91.2 fL (ref 80.0–100.0)
Monocytes Absolute: 0.6 10*3/uL (ref 0.1–1.0)
Monocytes Relative: 5 %
Neutro Abs: 8.3 10*3/uL — ABNORMAL HIGH (ref 1.7–7.7)
Neutrophils Relative %: 65 %
Platelets: 196 10*3/uL (ref 150–400)
RBC: 4.56 MIL/uL (ref 4.22–5.81)
RDW: 13.7 % (ref 11.5–15.5)
WBC: 12.7 10*3/uL — ABNORMAL HIGH (ref 4.0–10.5)
nRBC: 0 % (ref 0.0–0.2)
nRBC: 0 /100 WBC

## 2019-12-21 LAB — BASIC METABOLIC PANEL
Anion gap: 11 (ref 5–15)
BUN: 16 mg/dL (ref 6–20)
CO2: 26 mmol/L (ref 22–32)
Calcium: 9 mg/dL (ref 8.9–10.3)
Chloride: 102 mmol/L (ref 98–111)
Creatinine, Ser: 0.89 mg/dL (ref 0.61–1.24)
GFR calc Af Amer: >60 mL/min (ref >60)
GFR calc non Af Amer: >60 mL/min (ref >60)
Glucose, Bld: 92 mg/dL (ref 70–99)
Potassium: 4.3 mmol/L (ref 3.5–5.1)
Sodium: 139 mmol/L (ref 135–145)

## 2019-12-21 LAB — SODIUM, URINE, RANDOM: Sodium, Ur: 64 mmol/L

## 2019-12-21 LAB — OSMOLALITY, URINE: Osmolality, Ur: 332 mOsm/kg (ref 300–900)

## 2019-12-21 MED ORDER — MIDODRINE HCL 5 MG PO TABS
2.5000 mg | ORAL_TABLET | Freq: Three times a day (TID) | ORAL | Status: DC
  Administered 2019-12-21 – 2019-12-22 (×4): 2.5 mg via ORAL
  Filled 2019-12-21 (×4): qty 1

## 2019-12-21 NOTE — Progress Notes (Addendum)
Progress Note  Patient Name: Jaime [name] Date of Encounter: 12/21/2019  Primary Cardiologist: No primary care provider on file.   Subjective   Slept well last night.  Still some dizziness with standing up.  BP remains in the 85-61mmHg range systolic  Inpatient Medications    Scheduled Meds: . enoxaparin (LOVENOX) injection  40 mg Subcutaneous Q24H  . pantoprazole  40 mg Oral Daily   Continuous Infusions:  PRN Meds: acetaminophen **OR** acetaminophen, ondansetron **OR** ondansetron (ZOFRAN) IV, sorbitol   Vital Signs    Vitals:   12/20/19 1700 12/20/19 1951 12/21/19 0538 12/21/19 0601  BP:  95/61 (!) 87/60 (!) 85/56  Pulse:  82 (!) 58 64  Resp: 18 18 16    Temp:  97.8 F (36.6 C) 97.7 F (36.5 C)   TempSrc:  Oral Oral   SpO2:  99% 100%     Intake/Output Summary (Last 24 hours) at 12/21/2019 0801 Last data filed at 12/21/2019 0534 Gross per 24 hour  Intake 840 ml  Output 2100 ml  Net -1260 ml   There were no vitals filed for this visit.  Telemetry    No tele - Personally Reviewed  ECG    No new EKG to review- Personally Reviewed  Physical Exam   GEN: No acute distress.   Neck: No JVD Cardiac: RRR, no murmurs, rubs, or gallops.  Respiratory: Clear to auscultation bilaterally. GI: Soft, nontender, non-distended  MS: No edema; No deformity. Neuro:  Nonfocal  Psych: Normal affect   Labs    Chemistry Recent Labs  Lab 12/16/19 0540 12/16/19 0540 12/17/19 0420 12/18/19 0409 12/21/19 0506  NA 138   < > 139 139 139  K 4.0   < > 3.9 3.9 4.3  CL 106   < > 100 102 102  CO2 27   < > 27 28 26   GLUCOSE 112*   < > 94 96 92  BUN 14   < > 15 16 16   CREATININE 0.87   < > 1.00 1.00 0.89  CALCIUM 8.8*   < > 8.7* 8.7* 9.0  PROT 5.8*  --  5.9* 6.0*  --   ALBUMIN 3.3*  --  3.3* 3.3*  --   AST 19  --  18 17  --   ALT 14  --  15 18  --   ALKPHOS 34*  --  37* 33*  --   BILITOT 0.5  --  0.4 0.8  --   GFRNONAA >60   < > >60 >60 >60  GFRAA >60   < > >60  >60 >60  ANIONGAP 5   < > 12 9 11    < > = values in this interval not displayed.     Hematology Recent Labs  Lab 12/17/19 0420 12/18/19 0409 12/21/19 0506  WBC 14.6* 13.4* 12.7*  RBC 4.50 4.73 4.56  HGB 14.2 15.0 14.3  HCT 40.8 43.8 41.6  MCV 90.7 92.6 91.2  MCH 31.6 31.7 31.4  MCHC 34.8 34.2 34.4  RDW 13.3 13.3 13.7  PLT 211 216 196    Cardiac EnzymesNo results for input(s): TROPONINI in the last 168 hours. No results for input(s): TROPIPOC in the last 168 hours.   BNPNo results for input(s): BNP, PROBNP in the last 168 hours.   DDimer No results for input(s): DDIMER in the last 168 hours.   Radiology    ECHOCARDIOGRAM COMPLETE  Result Date: 12/20/2019    ECHOCARDIOGRAM REPORT   Patient Name:  Georgann Housekeeper Date of Exam: 12/20/2019 Medical Rec #:  [id number]         Height:       73.0 in Accession #:    [id number]        Weight:       120.0 lb Date of Birth:  Jul 03, 1996         BSA:          1.732 m Patient Age:    24 years          BP:           113/78 mmHg Patient Gender: M                 HR:           106 bpm. Exam Location:  Inpatient Procedure: 2D Echo                            MODIFIED REPORT: This report was modified by Lennie Odor MD on 12/20/2019 due to error.  Indications:     Tachycardia.  History:         Patient has no prior history of Echocardiogram examinations.                  Multiple sclerosis.  Sonographer:     Delcie Roch Referring Phys:  [id number] Beatriz Stallion Diagnosing Phys: Lennie Odor MD  Sonographer Comments: Image acquisition challenging due to respiratory motion. IMPRESSIONS  1. Left ventricular ejection fraction, by estimation, is 60 to 65%. The left ventricle has normal function. The left ventricle has no regional wall motion abnormalities. Left ventricular diastolic parameters were normal.  2. Right ventricular systolic function is normal. The right ventricular size is normal. Tricuspid regurgitation signal is inadequate for assessing  PA pressure.  3. Moderate pericardial effusion. The pericardial effusion is anterior to the right ventricle and posterior to the left ventricle. There is no evidence of cardiac tamponade.  4. The mitral valve is grossly normal. No evidence of mitral valve regurgitation. No evidence of mitral stenosis.  5. The aortic valve is tricuspid. Aortic valve regurgitation is not visualized. No aortic stenosis is present.  6. The inferior vena cava is normal in size with greater than 50% respiratory variability, suggesting right atrial pressure of 3 mmHg. FINDINGS  Left Ventricle: Left ventricular ejection fraction, by estimation, is 60 to 65%. The left ventricle has normal function. The left ventricle has no regional wall motion abnormalities. The left ventricular internal cavity size was normal in size. There is  no left ventricular hypertrophy. Left ventricular diastolic parameters were normal. Right Ventricle: The right ventricular size is normal. No increase in right ventricular wall thickness. Right ventricular systolic function is normal. Tricuspid regurgitation signal is inadequate for assessing PA pressure. Left Atrium: Left atrial size was normal in size. Right Atrium: Right atrial size was normal in size. Pericardium: A moderately sized pericardial effusion is present. The pericardial effusion is anterior to the right ventricle and posterior to the left ventricle. There is no evidence of cardiac tamponade. Presence of pericardial fat pad. Mitral Valve: The mitral valve is grossly normal. No evidence of mitral valve regurgitation. No evidence of mitral valve stenosis. Tricuspid Valve: The tricuspid valve is grossly normal. Tricuspid valve regurgitation is not demonstrated. No evidence of tricuspid stenosis. Aortic Valve: The aortic valve is tricuspid. Aortic valve regurgitation is not visualized. No aortic stenosis is present. Pulmonic Valve: The  pulmonic valve was grossly normal. Pulmonic valve regurgitation is not  visualized. No evidence of pulmonic stenosis. Aorta: The aortic root is normal in size and structure. Venous: The inferior vena cava is normal in size with greater than 50% respiratory variability, suggesting right atrial pressure of 3 mmHg. IAS/Shunts: No atrial level shunt detected by color flow Doppler.  LEFT VENTRICLE PLAX 2D LVIDd:         3.50 cm  Diastology LVIDs:         2.30 cm  LV e' lateral: 10.20 cm/s LV PW:         1.00 cm  LV e' medial:  9.79 cm/s LV IVS:        0.90 cm LVOT diam:     2.00 cm LV SV:         37 LV SV Index:   21 LVOT Area:     3.14 cm  RIGHT VENTRICLE TAPSE (M-mode): 1.7 cm LEFT ATRIUM           Index       RIGHT ATRIUM          Index LA diam:      2.10 cm 1.21 cm/m  RA Area:     7.62 cm LA Vol (A4C): 22.9 ml 13.22 ml/m RA Volume:   13.40 ml 7.74 ml/m  AORTIC VALVE LVOT Vmax:   80.90 cm/s LVOT Vmean:  51.600 cm/s LVOT VTI:    0.118 m  AORTA Ao Root diam: 2.70 cm  SHUNTS Systemic VTI:  0.12 m Systemic Diam: 2.00 cm Lennie Odor MD Electronically signed by Lennie Odor MD Signature Date/Time: 12/20/2019/5:41:45 PM    Final (Updated)     Cardiac Studies   2D echo 12/20/19 IMPRESSIONS   1. Left ventricular ejection fraction, by estimation, is 60 to 65%. The  left ventricle has normal function. The left ventricle has no regional  wall motion abnormalities. Left ventricular diastolic parameters were  normal.  2. Right ventricular systolic function is normal. The right ventricular  size is normal. Tricuspid regurgitation signal is inadequate for assessing  PA pressure.  3. Moderate pericardial effusion. The pericardial effusion is anterior to  the right ventricle and posterior to the left ventricle. There is no  evidence of cardiac tamponade.  4. The mitral valve is grossly normal. No evidence of mitral valve  regurgitation. No evidence of mitral stenosis.  5. The aortic valve is tricuspid. Aortic valve regurgitation is not  visualized. No aortic stenosis is  present.  6. The inferior vena cava is normal in size with greater than 50%  respiratory variability, suggesting right atrial pressure of 3 mmHg.   Patient Profile     24 y.o. male with a hx of autism and depression who was admitted 3/9 with progressive weakness and falling.  He was diagnosed with MS and started on IV steroids and discharged to [facility] on 3/15.  Since then PT has been working with him and has noticed that his HR will jump up to the 150-170's when PT is working with him.  He also has had soft BPs with SBP in the 90's.    Assessment & Plan    1. Sinus Tachycardia/Autonomic dysfunction:  -sinus tachycardia by EKG in the low 100's -Orthostatic BPs positive with drop in BP and increase in HR with standing c/w autonomic dysfunction -autonomic dysfunction likely related to MS -2D echo with normal LV and RVF -TSH pending  2. Hypotension:  -he describes orthostatic symptoms.  -orthostatic  BPs abnormal with drop in SBP into the 80's and increased in HR in to the 115 range with standing consistent with autonomic dysfunction -changed to thigh high compression hose yesterday -will add abdominal binder -add Proamatine 2.5mg  TID and titrate as needed -Specific gravity on UA normal  3. MS: recently diagnosed. Currently admitted to [facility] to follow-up with neurology outpatient     For questions or updates, please contact [place] Please consult [email or web address] for contact info under Cardiology/STEMI.      Signed, Fransico Him, MD  12/21/2019, 8:01 AM

## 2019-12-21 NOTE — Progress Notes (Signed)
[facility] PHYSICAL MEDICINE & REHABILITATION PROGRESS NOTE   Subjective/Complaints:  Pt reports doing well- doesn't think had BM yesterday.  Dizzy still when stands; not sits up.  HR also goes up when stands- was up to 160-170- ECHO OK.   Spoke to Cards- pt has severe autonomic dysfunction and will add abd binder, TEDS thigh high and Midodrine,   ROS:   Pt denies SOB, abd pain, CP, N/V/C/D, and vision changes   Objective:   ECHOCARDIOGRAM COMPLETE  Result Date: 12/20/2019    ECHOCARDIOGRAM REPORT   Patient Name:   Jaime [name] Date of Exam: 12/20/2019 Medical Rec #:  [id number]         Height:       73.0 in Accession #:    [id number]        Weight:       120.0 lb Date of Birth:  03-02-1996         BSA:          1.732 m Patient Age:    23 years          BP:           113/78 mmHg Patient Gender: M                 HR:           106 bpm. Exam Location:  Inpatient Procedure: 2D Echo                            MODIFIED REPORT: This report was modified by Lennie Odor MD on 12/20/2019 due to error.  Indications:     Tachycardia.  History:         Patient has no prior history of Echocardiogram examinations.                  Multiple sclerosis.  Sonographer:     Delcie Roch Referring Phys:  [id number] Beatriz Stallion Diagnosing Phys: Lennie Odor MD  Sonographer Comments: Image acquisition challenging due to respiratory motion. IMPRESSIONS  1. Left ventricular ejection fraction, by estimation, is 60 to 65%. The left ventricle has normal function. The left ventricle has no regional wall motion abnormalities. Left ventricular diastolic parameters were normal.  2. Right ventricular systolic function is normal. The right ventricular size is normal. Tricuspid regurgitation signal is inadequate for assessing PA pressure.  3. Moderate pericardial effusion. The pericardial effusion is anterior to the right ventricle and posterior to the left ventricle. There is no evidence of cardiac tamponade.  4.  The mitral valve is grossly normal. No evidence of mitral valve regurgitation. No evidence of mitral stenosis.  5. The aortic valve is tricuspid. Aortic valve regurgitation is not visualized. No aortic stenosis is present.  6. The inferior vena cava is normal in size with greater than 50% respiratory variability, suggesting right atrial pressure of 3 mmHg. FINDINGS  Left Ventricle: Left ventricular ejection fraction, by estimation, is 60 to 65%. The left ventricle has normal function. The left ventricle has no regional wall motion abnormalities. The left ventricular internal cavity size was normal in size. There is  no left ventricular hypertrophy. Left ventricular diastolic parameters were normal. Right Ventricle: The right ventricular size is normal. No increase in right ventricular wall thickness. Right ventricular systolic function is normal. Tricuspid regurgitation signal is inadequate for assessing PA pressure. Left Atrium: Left atrial size was normal in size. Right Atrium: Right atrial size  was normal in size. Pericardium: A moderately sized pericardial effusion is present. The pericardial effusion is anterior to the right ventricle and posterior to the left ventricle. There is no evidence of cardiac tamponade. Presence of pericardial fat pad. Mitral Valve: The mitral valve is grossly normal. No evidence of mitral valve regurgitation. No evidence of mitral valve stenosis. Tricuspid Valve: The tricuspid valve is grossly normal. Tricuspid valve regurgitation is not demonstrated. No evidence of tricuspid stenosis. Aortic Valve: The aortic valve is tricuspid. Aortic valve regurgitation is not visualized. No aortic stenosis is present. Pulmonic Valve: The pulmonic valve was grossly normal. Pulmonic valve regurgitation is not visualized. No evidence of pulmonic stenosis. Aorta: The aortic root is normal in size and structure. Venous: The inferior vena cava is normal in size with greater than 50% respiratory  variability, suggesting right atrial pressure of 3 mmHg. IAS/Shunts: No atrial level shunt detected by color flow Doppler.  LEFT VENTRICLE PLAX 2D LVIDd:         3.50 cm  Diastology LVIDs:         2.30 cm  LV e' lateral: 10.20 cm/s LV PW:         1.00 cm  LV e' medial:  9.79 cm/s LV IVS:        0.90 cm LVOT diam:     2.00 cm LV SV:         37 LV SV Index:   21 LVOT Area:     3.14 cm  RIGHT VENTRICLE TAPSE (M-mode): 1.7 cm LEFT ATRIUM           Index       RIGHT ATRIUM          Index LA diam:      2.10 cm 1.21 cm/m  RA Area:     7.62 cm LA Vol (A4C): 22.9 ml 13.22 ml/m RA Volume:   13.40 ml 7.74 ml/m  AORTIC VALVE LVOT Vmax:   80.90 cm/s LVOT Vmean:  51.600 cm/s LVOT VTI:    0.118 m  AORTA Ao Root diam: 2.70 cm  SHUNTS Systemic VTI:  0.12 m Systemic Diam: 2.00 cm Eleonore Chiquito MD Electronically signed by Eleonore Chiquito MD Signature Date/Time: 12/20/2019/5:41:45 PM    Final (Updated)    Recent Labs    12/21/19 0506  WBC 12.7*  HGB 14.3  HCT 41.6  PLT 196   Recent Labs    12/21/19 0506  NA 139  K 4.3  CL 102  CO2 26  GLUCOSE 92  BUN 16  CREATININE 0.89  CALCIUM 9.0    Intake/Output Summary (Last 24 hours) at 12/21/2019 1935 Last data filed at 12/21/2019 1845 Gross per 24 hour  Intake 960 ml  Output 1800 ml  Net -840 ml     Physical Exam: Vital Signs Blood pressure 93/62, pulse 86, temperature 97.6 F (36.4 C), temperature source Oral, resp. rate 18, SpO2 98 %.  General: sitting up getting meds again today; appropriate, NAD Psychiatric:chronic slowed processing Heart: borderline tachycardia; regular rhythm Lungs: CTA B/L- no W/R/R Abdomen: Soft, NT, ND, (+)BS  Extremities: No clubbing, cyanosis, or edema Skin: No evidence of breakdown, no evidence of rash Neurologic: No nystagmus today.  , motor strength is 4/5 in bilateral deltoid, bicep, tricep, grip, hip flexor, knee extensors, ankle dorsiflexor and plantar flexor Sensory exam normal sensation to light touch  in  bilateral upper and lower extremities Cerebellar exam minimal dysmetria finger to nose to finger as well as heel to shin  in bilateral upper and lower extremities Musculoskeletal: Full range of motion in all 4 extremities. No joint swelling     Assessment/Plan: 1. Functional deficits secondary to  Likely initial MS exacerbation which require 3+ hours per day of interdisciplinary therapy in a comprehensive inpatient rehab setting.  Physiatrist is providing close team supervision and 24 hour management of active medical problems listed below.  Physiatrist and rehab team continue to assess barriers to discharge/monitor patient progress toward functional and medical goals  Care Tool:  Bathing    Body parts bathed by patient: Right arm, Left arm, Chest, Abdomen, Front perineal area, Buttocks, Right upper leg, Left upper leg, Right lower leg, Left lower leg, Face         Bathing assist Assist Level: Contact Guard/Touching assist     Upper Body Dressing/Undressing Upper body dressing   What is the patient wearing?: Pull over shirt    Upper body assist Assist Level: Set up assist    Lower Body Dressing/Undressing Lower body dressing      What is the patient wearing?: Underwear/pull up, Pants     Lower body assist Assist for lower body dressing: Contact Guard/Touching assist     Toileting Toileting    Toileting assist Assist for toileting: Contact Guard/Touching assist     Transfers Chair/bed transfer  Transfers assist     Chair/bed transfer assist level: Contact Guard/Touching assist     Locomotion Ambulation   Ambulation assist      Assist level: Minimal Assistance - Patient > 75% Assistive device: No Device Max distance: 150'   Walk 10 feet activity   Assist     Assist level: Minimal Assistance - Patient > 75% Assistive device: No Device   Walk 50 feet activity   Assist    Assist level: Minimal Assistance - Patient > 75% Assistive device:  No Device    Walk 150 feet activity   Assist    Assist level: Minimal Assistance - Patient > 75% Assistive device: No Device    Walk 10 feet on uneven surface  activity   Assist     Assist level: Minimal Assistance - Patient > 75% Assistive device: Hand held assist   Wheelchair     Assist Will patient use wheelchair at discharge?: No(Patient is a functional ambulator)   Wheelchair activity did not occur: N/A         Wheelchair 50 feet with 2 turns activity    Assist    Wheelchair 50 feet with 2 turns activity did not occur: N/A       Wheelchair 150 feet activity     Assist  Wheelchair 150 feet activity did not occur: N/A       Blood pressure 93/62, pulse 86, temperature 97.6 F (36.4 C), temperature source Oral, resp. rate 18, SpO2 98 %.  Medical Problem List and Plan: 1.Decreased functional ability with diffuse weaknesssecondary to multiple sclerosis. IV Solu-Medrol completed. Plan follow-up outpatient Dr. Epimenio Foot. -patient may  shower -ELOS/Goals: 9-12d 2. Antithrombotics: -DVT/anticoagulation:Lovenox.Check vascular study 3/17- Dopplers (-) -antiplatelet therapy: N/A 3. Pain Management:Tylenol as needed 4. Mood:Provide emotional support -antipsychotic agents: N/A 5. Neuropsych: This patientiscapable of making decisions on hisown behalf. 6. Skin/Wound Care:Routine skin checks 7. Fluids/Electrolytes/Nutrition:Routine in and outs with follow-up chemistries 8. High-level functioning autism. Patient appears to be at baseline. Will discuss with mother and family. 9. Tachycardia  3/16- running 100-160s depending on exertion- could just be due to deconditioning and Solumedrol- will monitor closely- might need low dose  B blocker  3/17- much improved at rest this AM- 70s-80s- will con't to monitor  3/18- rate 69 this AM- doing well  3/19- ended up calling Cards consult yesterday-  pt's HR up to 160-170 with standing and BP low- had ECHO- Cards feel its autonomic dysfunction and needs TEDs, thigh high, Abd binder and Midodrine 2.5 mg TID- is ordered 10. Leukocytosis  3/16- WBC down to 13.4 from 14.6- due to solumedrol- will monitor for getting worse or Sx's of illness.   3/18- will recheck in AM  3/19- down to 11.6- doing better- con't regimen 11. Hypotension  3/16- BP 90/48- is 23 and says eats a low salt diet- not sure if true- has a lot of snacks in room- however BP usually lower in young pt's- asymptomatic- so again, will monitor  3/17- BP 101/71- better- con't to monitor- asymptomatic.   3/19- added midodrine as above 12. Dizziness- due to nystagmus- working with OT/PT on vestibular eval.   3/18- blurry vision and dizziness improved- nystagmus is improving.  3/19- blurry vision bette;r dizziness due to #11- working on it as above.   13. Constipation  3/17- LBM 6 days ago- doesn't feel constipated- will give Sorbitol and Miralax- if doesn't work, will need Mg citrate.   3/18- LBM yesterday- con't meds        LOS: 4 days A FACE TO FACE EVALUATION WAS PERFORMED  Kristol Almanzar 12/21/2019, 7:35 PM

## 2019-12-21 NOTE — Progress Notes (Signed)
Occupational Therapy Session Note  Patient Details  Name: Jaime [name] MRN: [id number] Date of Birth: [birth year]  Today's Date: 12/21/2019 OT Individual Time: 3818-2993 OT Individual Time Calculation (min): 73 min   Short Term Goals: Week 1:  OT Short Term Goal 1 (Week 1): STG = LTG  Skilled Therapeutic Interventions/Progress Updates:    Pt greeted in his recliner with no c/o pain. Requesting to shower. Min A for ambulatory transfer to toilet and then TTB. Pt with a few anterior LOBs needing A to correct, ataxic with postural control deficits during mobility. CGA for dynamic standing tasks during toileting and then transitioned to TTB. Pt once again needing CGA for dynamic standing. He dressed sit<stand from [facility] outside of shower afterwards. Taught him an adaptive technique to don his thigh high Teds with pt needing min-mod cuing for carryover. Setup for overhead shirt and gripper socks. He then ambulated back to his recliner. Pt used adhesive remover wipes to remove adhesive glue from his skin with supervision assist. Pt remained in the recliner with all needs within reach and safety belt fastened.    Therapy Documentation Precautions:  Precautions Precautions: Fall Restrictions Weight Bearing Restrictions: No ADL: ADL Eating: Independent Where Assessed-Eating: Chair Grooming: Supervision/safety, Contact guard Where Assessed-Grooming: Standing at sink Upper Body Bathing: Supervision/safety Where Assessed-Upper Body Bathing: Shower Lower Body Bathing: Minimal assistance Where Assessed-Lower Body Bathing: Shower Upper Body Dressing: Minimal assistance Where Assessed-Upper Body Dressing: Chair Lower Body Dressing: Minimal assistance Where Assessed-Lower Body Dressing: Chair Toileting: Minimal assistance Where Assessed-Toileting: Teacher, adult education: Curator Method: Proofreader: Acupuncturist: Tour manager Method: Designer, industrial/product: Grab bars, Transfer tub bench      Therapy/Group: Individual Therapy  [name] A Adonis Yim 12/21/2019, 4:31 PM

## 2019-12-21 NOTE — Progress Notes (Signed)
Patient slept most of shift. Orthostatic vital done as ordered this shift, patient denies dizziness or distress. Patient slept throughout shift without any complicates or distress. Will continue to monitor.

## 2019-12-21 NOTE — Progress Notes (Signed)
Orthopedic Tech Progress Note Patient Details:  Jaime [name] Feb 21, 1996 [id number]  Ortho Devices Type of Ortho Device: Abdominal binder Ortho Device/Splint Interventions: Application   Post Interventions Patient Tolerated: Well Instructions Provided: Care of device   Saul Fordyce 12/21/2019, 11:28 AM

## 2019-12-21 NOTE — Plan of Care (Signed)
  Problem: Consults Goal: RH GENERAL PATIENT EDUCATION Description: See Patient Education module for education specifics. Outcome: Progressing Goal: Skin Care Protocol Initiated - if Braden Score 18 or less Description: If consults are not indicated, leave blank or document N/A Outcome: Progressing Goal: Nutrition Consult-if indicated Outcome: Progressing   Problem: RH BOWEL ELIMINATION Goal: RH STG MANAGE BOWEL WITH ASSISTANCE Description: STG Manage Bowel with mod I  Outcome: Progressing Goal: RH STG MANAGE BOWEL W/MEDICATION W/ASSISTANCE Description: STG Manage Bowel with Medication with mod Assistance. Outcome: Progressing   Problem: RH BLADDER ELIMINATION Goal: RH STG MANAGE BLADDER WITH ASSISTANCE Description: STG Manage Bladder With Assistance Outcome: Progressing Goal: RH STG MANAGE BLADDER WITH MEDICATION WITH ASSISTANCE Description: STG Manage Bladder With Medication With Assistance. Outcome: Progressing   Problem: RH SKIN INTEGRITY Goal: RH STG SKIN FREE OF INFECTION/BREAKDOWN Outcome: Progressing Goal: RH STG MAINTAIN SKIN INTEGRITY WITH ASSISTANCE Description: STG Maintain Skin Integrity With Assistance. Outcome: Progressing   Problem: RH SAFETY Goal: RH STG ADHERE TO SAFETY PRECAUTIONS W/ASSISTANCE/DEVICE Description: STG Adhere to Safety Precautions With Assistance/Device. Outcome: Progressing Goal: RH STG DECREASED RISK OF FALL WITH ASSISTANCE Description: STG Decreased Risk of Fall With Assistance. Outcome: Progressing   Problem: RH PAIN MANAGEMENT Goal: RH STG PAIN MANAGED AT OR BELOW PT'S PAIN GOAL Outcome: Progressing   Problem: RH KNOWLEDGE DEFICIT GENERAL Goal: RH STG INCREASE KNOWLEDGE OF SELF CARE AFTER HOSPITALIZATION Outcome: Progressing   Problem: RH Vision Goal: RH LTG Vision (Specify) Outcome: Progressing

## 2019-12-21 NOTE — Progress Notes (Signed)
Physical Therapy Session Note  Patient Details  Name: Jaime [name] MRN: [id number] Date of Birth: [birth year]  Today's Date: 12/21/2019 PT Individual Time: 0902-0959 and 1102-1200 PT Individual Time Calculation (min): 57 min and 58 min  Short Term Goals: Week 1:  PT Short Term Goal 1 (Week 1): STG=LTG due to short ELOS.  Skilled Therapeutic Interventions/Progress Updates:  First session:  Pt presents supine in bed and agreeable to therapy.  Pt transfers sup to sit on EOB w/ independence.  Pt required CGA to close supervision for sit to stand and SPT to w/c.  Noted urine on bed and on pants.  Performed sit to stand to doff pants and underpants and clean peri area w/ CGA for balance only.  Pt then sat and donned underwear and underpants w/ min A and stood to continue clothing management.  Pt amb multiple trials w/o AD and min to CGA.  Pt amb up to 150' w/ occasional scissoring, especially w/ turns.  Pt states need for BR, so amb back to room for min A for standing at toilet.  Urinal given to NT for sample and monitoring.  Pt required CGA to pull up pants.  Pt performed car transfer w/ CGA and verbal cues for sequencing as pt put foot in and sat.  Pt negotiated 12 6" steps (4 steps x 3) w/ right railing and CGA, reciprocal gait pattern w/o LOB.   Pt left in recliner w/ chair alarm on and all needs in place.  Second session: Pt presents seated in recliner and agreeable to participate in therapy.  Pt amb into BR for use w/ CGA and cues for safety, continent of urine. Pt negotiated w/c out of room and to gym using bilateral LEs.   BP monitored during sitting at 100/68.  Pt transferred sit to stand w/ supervision.  Pt performed standing on cushioned surface x 2' w/ sway noted.  Pt BP measured after standing at 92/56, but no c/o dizziness and returned to 108/53 withing 1'.  Pt performed standing on cushioned surface w/ eyes closed as well as performing peg board x 6-8' per 2 trials w/o c/o.  Pt performed  short obstacle courses w/ cones to improve turns, w/ scissoring noted.  Pt returned to room and amb w/o AD around bed and left in recliner w/ all needs in place and chair alarm on.       Therapy Documentation Precautions:  Precautions Precautions: Fall Restrictions Weight Bearing Restrictions: No General:   Vital Signs:98/65 sitting after completed short gait distance (w/ c/o dizziness) and increased to 111/68 w/o c/o dizziness approx. 1' later.  Pain:  0/10    Therapy/Group: Individual Therapy  [name] 12/21/2019, 10:20 AM

## 2019-12-21 NOTE — Progress Notes (Signed)
Patient alert and oriented and voices no distress this shift; Urine collected and sent to lab as ordered. Will continue to monitor

## 2019-12-22 ENCOUNTER — Inpatient Hospital Stay (HOSPITAL_COMMUNITY): Payer: Medicaid Other | Admitting: Occupational Therapy

## 2019-12-22 DIAGNOSIS — F840 Autistic disorder: Secondary | ICD-10-CM

## 2019-12-22 LAB — URINALYSIS, ROUTINE W REFLEX MICROSCOPIC
Bilirubin Urine: NEGATIVE
Glucose, UA: NEGATIVE mg/dL
Hgb urine dipstick: NEGATIVE
Ketones, ur: NEGATIVE mg/dL
Leukocytes,Ua: NEGATIVE
Nitrite: POSITIVE — AB
Protein, ur: NEGATIVE mg/dL
Specific Gravity, Urine: 1.01 (ref 1.005–1.030)
pH: 7 (ref 5.0–8.0)

## 2019-12-22 LAB — CORTISOL: Cortisol, Plasma: 11.8 ug/dL

## 2019-12-22 MED ORDER — MIDODRINE HCL 5 MG PO TABS
2.5000 mg | ORAL_TABLET | ORAL | Status: DC
  Administered 2019-12-22 – 2019-12-23 (×4): 2.5 mg via ORAL
  Filled 2019-12-22 (×4): qty 1

## 2019-12-22 NOTE — Progress Notes (Signed)
This nurse notified Dr. Wynn Banker regarding patient's heart rate with therapy. No new orders.  Per Dr. Wynn Banker contact cardiology.  Cardiology paged at [phone]. Will continue to monitor. This nurse spoke to Theodore Demark with cardiology . Per Bjorn Loser she feels that patient is holding his arm tight , and she doesn't believe the heart rate numbers are correct. No new orders or medications changes at this time. Will continue to monitor.

## 2019-12-22 NOTE — Progress Notes (Signed)
[facility] PHYSICAL MEDICINE & REHABILITATION PROGRESS NOTE   Subjective/Complaints:    No palpitations in bed, no pains today , using urinal ok   ROS:   Pt denies SOB, abd pain, CP, N/V/C/D, and vision changes   Objective:   ECHOCARDIOGRAM COMPLETE  Result Date: 12/20/2019    ECHOCARDIOGRAM REPORT   Patient Name:   Jaime [name] Date of Exam: 12/20/2019 Medical Rec #:  [id number]         Height:       73.0 in Accession #:    [id number]        Weight:       120.0 lb Date of Birth:  09-30-1996         BSA:          1.732 m Patient Age:    24 years          BP:           113/78 mmHg Patient Gender: M                 HR:           106 bpm. Exam Location:  Inpatient Procedure: 2D Echo                            MODIFIED REPORT: This report was modified by Eleonore Chiquito MD on 12/20/2019 due to error.  Indications:     Tachycardia.  History:         Patient has no prior history of Echocardiogram examinations.                  Multiple sclerosis.  Sonographer:     Johny Chess Referring Phys:  [id number] Abigail Butts Diagnosing Phys: Eleonore Chiquito MD  Sonographer Comments: Image acquisition challenging due to respiratory motion. IMPRESSIONS  1. Left ventricular ejection fraction, by estimation, is 60 to 65%. The left ventricle has normal function. The left ventricle has no regional wall motion abnormalities. Left ventricular diastolic parameters were normal.  2. Right ventricular systolic function is normal. The right ventricular size is normal. Tricuspid regurgitation signal is inadequate for assessing PA pressure.  3. Moderate pericardial effusion. The pericardial effusion is anterior to the right ventricle and posterior to the left ventricle. There is no evidence of cardiac tamponade.  4. The mitral valve is grossly normal. No evidence of mitral valve regurgitation. No evidence of mitral stenosis.  5. The aortic valve is tricuspid. Aortic valve regurgitation is not visualized. No aortic  stenosis is present.  6. The inferior vena cava is normal in size with greater than 50% respiratory variability, suggesting right atrial pressure of 3 mmHg. FINDINGS  Left Ventricle: Left ventricular ejection fraction, by estimation, is 60 to 65%. The left ventricle has normal function. The left ventricle has no regional wall motion abnormalities. The left ventricular internal cavity size was normal in size. There is  no left ventricular hypertrophy. Left ventricular diastolic parameters were normal. Right Ventricle: The right ventricular size is normal. No increase in right ventricular wall thickness. Right ventricular systolic function is normal. Tricuspid regurgitation signal is inadequate for assessing PA pressure. Left Atrium: Left atrial size was normal in size. Right Atrium: Right atrial size was normal in size. Pericardium: A moderately sized pericardial effusion is present. The pericardial effusion is anterior to the right ventricle and posterior to the left ventricle. There is no evidence of cardiac tamponade. Presence of pericardial  fat pad. Mitral Valve: The mitral valve is grossly normal. No evidence of mitral valve regurgitation. No evidence of mitral valve stenosis. Tricuspid Valve: The tricuspid valve is grossly normal. Tricuspid valve regurgitation is not demonstrated. No evidence of tricuspid stenosis. Aortic Valve: The aortic valve is tricuspid. Aortic valve regurgitation is not visualized. No aortic stenosis is present. Pulmonic Valve: The pulmonic valve was grossly normal. Pulmonic valve regurgitation is not visualized. No evidence of pulmonic stenosis. Aorta: The aortic root is normal in size and structure. Venous: The inferior vena cava is normal in size with greater than 50% respiratory variability, suggesting right atrial pressure of 3 mmHg. IAS/Shunts: No atrial level shunt detected by color flow Doppler.  LEFT VENTRICLE PLAX 2D LVIDd:         3.50 cm  Diastology LVIDs:         2.30 cm  LV  e' lateral: 10.20 cm/s LV PW:         1.00 cm  LV e' medial:  9.79 cm/s LV IVS:        0.90 cm LVOT diam:     2.00 cm LV SV:         37 LV SV Index:   21 LVOT Area:     3.14 cm  RIGHT VENTRICLE TAPSE (M-mode): 1.7 cm LEFT ATRIUM           Index       RIGHT ATRIUM          Index LA diam:      2.10 cm 1.21 cm/m  RA Area:     7.62 cm LA Vol (A4C): 22.9 ml 13.22 ml/m RA Volume:   13.40 ml 7.74 ml/m  AORTIC VALVE LVOT Vmax:   80.90 cm/s LVOT Vmean:  51.600 cm/s LVOT VTI:    0.118 m  AORTA Ao Root diam: 2.70 cm  SHUNTS Systemic VTI:  0.12 m Systemic Diam: 2.00 cm Lennie Odor MD Electronically signed by Lennie Odor MD Signature Date/Time: 12/20/2019/5:41:45 PM    Final (Updated)    Recent Labs    12/21/19 0506  WBC 12.7*  HGB 14.3  HCT 41.6  PLT 196   Recent Labs    12/21/19 0506  NA 139  K 4.3  CL 102  CO2 26  GLUCOSE 92  BUN 16  CREATININE 0.89  CALCIUM 9.0    Intake/Output Summary (Last 24 hours) at 12/22/2019 0844 Last data filed at 12/21/2019 2025 Gross per 24 hour  Intake 600 ml  Output 1350 ml  Net -750 ml     Physical Exam: Vital Signs Blood pressure (!) 97/49, pulse 66, temperature 98.2 F (36.8 C), resp. rate 16, SpO2 97 %.  General: sitting up getting meds again today; appropriate, NAD Psychiatric:chronic slowed processing Heart: borderline tachycardia; regular rhythm Lungs: CTA B/L- no W/R/R Abdomen: Soft, NT, ND, (+)BS  Extremities: No clubbing, cyanosis, or edema Skin: No evidence of breakdown, no evidence of rash Neurologic: No nystagmus today.  , motor strength is 4/5 in bilateral deltoid, bicep, tricep, grip, hip flexor, knee extensors, ankle dorsiflexor and plantar flexor Sensory exam normal sensation to light touch  in bilateral upper and lower extremities Cerebellar exam minimal dysmetria finger to nose to finger as well as heel to shin in bilateral upper and lower extremities Musculoskeletal: Full range of motion in all 4 extremities. No joint  swelling     Assessment/Plan: 1. Functional deficits secondary to  Likely initial MS exacerbation which require 3+ hours per day  of interdisciplinary therapy in a comprehensive inpatient rehab setting.  Physiatrist is providing close team supervision and 24 hour management of active medical problems listed below.  Physiatrist and rehab team continue to assess barriers to discharge/monitor patient progress toward functional and medical goals  Care Tool:  Bathing    Body parts bathed by patient: Right arm, Left arm, Chest, Abdomen, Front perineal area, Buttocks, Right upper leg, Left upper leg, Right lower leg, Left lower leg, Face         Bathing assist Assist Level: Contact Guard/Touching assist     Upper Body Dressing/Undressing Upper body dressing   What is the patient wearing?: Pull over shirt    Upper body assist Assist Level: Set up assist    Lower Body Dressing/Undressing Lower body dressing      What is the patient wearing?: Underwear/pull up, Pants     Lower body assist Assist for lower body dressing: Contact Guard/Touching assist     Toileting Toileting    Toileting assist Assist for toileting: Contact Guard/Touching assist     Transfers Chair/bed transfer  Transfers assist     Chair/bed transfer assist level: Contact Guard/Touching assist     Locomotion Ambulation   Ambulation assist      Assist level: Minimal Assistance - Patient > 75% Assistive device: No Device Max distance: 150'   Walk 10 feet activity   Assist     Assist level: Minimal Assistance - Patient > 75% Assistive device: No Device   Walk 50 feet activity   Assist    Assist level: Minimal Assistance - Patient > 75% Assistive device: No Device    Walk 150 feet activity   Assist    Assist level: Minimal Assistance - Patient > 75% Assistive device: No Device    Walk 10 feet on uneven surface  activity   Assist     Assist level: Minimal Assistance  - Patient > 75% Assistive device: Hand held assist   Wheelchair     Assist Will patient use wheelchair at discharge?: No(Patient is a functional ambulator)   Wheelchair activity did not occur: N/A         Wheelchair 50 feet with 2 turns activity    Assist    Wheelchair 50 feet with 2 turns activity did not occur: N/A       Wheelchair 150 feet activity     Assist  Wheelchair 150 feet activity did not occur: N/A       Blood pressure (!) 97/49, pulse 66, temperature 98.2 F (36.8 C), resp. rate 16, SpO2 97 %.  Medical Problem List and Plan: 1.Decreased functional ability with diffuse weaknesssecondary to multiple sclerosis. IV Solu-Medrol completed. Plan follow-up outpatient Dr. Epimenio Foot. -patient may  shower -ELOS/Goals: 9-12d 2. Antithrombotics: -DVT/anticoagulation:Lovenox.Check vascular study 3/17- Dopplers (-) -antiplatelet therapy: N/A 3. Pain Management:Tylenol as needed 4. Mood:Provide emotional support -antipsychotic agents: N/A 5. Neuropsych: This patientiscapable of making decisions on hisown behalf. 6. Skin/Wound Care:Routine skin checks 7. Fluids/Electrolytes/Nutrition:Routine in and outs with follow-up chemistries 8. High-level functioning autism. Patient appears to be at baseline. Will discuss with mother and family. 9. Tachycardia     Cards consulting  pt's HR up to 160-170 with standing and BP low- had ECHO- Cards feel its autonomic dysfunction and needs TEDs, thigh high, Abd binder and Midodrine 2.5 mg TID- is ordered 10. Leukocytosis  3/16- WBC down to 13.4 from 14.6- due to solumedrol- will monitor for getting worse or Sx's of illness.   3/18-  will recheck in AM  3/19- down to 11.6- doing better- con't regimen 11. Hypotension  Autonomic dysfunction d/t MS- =cardiology consulting  13. Constipation  3/17- LBM 6 days ago- doesn't feel constipated- will give Sorbitol and  Miralax- if doesn't work, will need Mg citrate.   3/18- LBM yesterday- con't meds        LOS: 5 days A FACE TO FACE EVALUATION WAS PERFORMED  Erick Colace 12/22/2019, 8:44 AM

## 2019-12-22 NOTE — Consult Note (Signed)
Neuropsychological Consultation   Patient:   Jaime [name]   DOB:   30-Apr-1996  MR Number:  [id number]  Location:  [facility] [place] [id number] [place] [address] Dept: [place]: [phone]           Date of Service:   12/21/2019  Start Time:   10 AM End Time:   11 AM  Provider/Observer:  Ilean Skill, Psy.D.       Clinical Neuropsychologist       Billing Code/Service: [phone]  Chief Complaint:    Jaime [name] is a 24 year old male with a history of depression and well-documented high functioning autism.  The patient presented on 12/12/2019 with weakness, blurred vision and falls over the prior 3 months.  MRI showed patchy signal abnormalities within visualized distal cord consistent with demyelinating disease.  Patient will have follow-up neurological work-up due to suspected multiple sclerosis and the patient was placed on IV Solu-Medrol for 5 days with plan to follow-up with Dr. Felecia Shelling as an outpatient.  The patient also has a history of depression but denies any acute worsening of his depression.  There are also several tics noted today and there may be some Tourette's-like symptoms as well but the patient described them as his "stutter".  Reason for Service:  The patient was referred for neuropsychological consultation due to adjustment and coping issues with recent preliminary diagnosis of multiple sclerosis with indications of demyelination in the distal cord regions.  Below is the HPI for the current admission.  [id number] E. Jaime [name] is a 24 year old right-handed male history of depression well-documented high functioning autism. Per chart review lives with his mother and 3 younger siblings. Second floor apartment. Reportedly independent prior to admission had been volunteering at [employer]. Presented 12/12/2019 with weakness, blurred vision and falls x3 months. On 1 occasion  he did strike his head without loss of consciousness. Denies any bowel or bladder disturbances. MRI showed patchy signal abnormality within the visualized distal cord consistent with demyelinating disease. No abnormal enhancement to suggest active demyelination. Tiny central disc protrusion at L5-S1 without stenosis or impingement. Admission chemistries unremarkable except potassium 3.4, SARS coronavirus negative. LP completed CSF no growth glucose 57 protein 33. Neurology follow-up suspect multiple sclerosis placed on IV Solu-Medrol x5 days with plan to follow-up Dr. Felecia Shelling as an outpatient. Lovenox for DVT prophylaxis. Tolerating a regular consistency diet. Therapy evaluations completed and patient was admitted for a comprehensive rehab program.  Current Status:  The patient was alert and oriented today and appeared to be well aware of current diagnostic considerations.  The patient was referred to the comprehensive inpatient rehabilitation unit due to multiple falls and debility-like symptoms in the area of depression.  Today there were also several occasions with tic-like presentations although the patient described them as his "stutter" I did not spend enough time with him to differentiate.  Behavioral Observation: [name]  presents as a 24 y.o.-year-old Right African American Male who appeared his stated age. his dress was Appropriate and he was Well Groomed and his manners were Appropriate to the situation.  his participation was indicative of Appropriate and Attentive behaviors.  There were any physical disabilities noted.  he displayed an appropriate level of cooperation and motivation.     Interactions:    Active Appropriate and Attentive  Attention:   within normal limits and attention span and concentration were age appropriate  Memory:  within normal limits; recent and remote memory intact  Visuo-spatial:  not examined  Speech (Volume):  normal  Speech:   normal;  there were some indications of stutter and/or tic-like symptoms.  Thought Process:  Coherent and Relevant  Though Content:  WNL; not suicidal and not homicidal  Orientation:   person, place, time/date and situation  Judgment:   Good  Planning:   Good  Affect:    Appropriate  Mood:    Dysphoric  Insight:   Good  Intelligence:   normal  Medical History:   Past Medical History:  Diagnosis Date  . Autism   . Depression    Psychiatric History:  The patient does have a past history of depressive events along with a diagnosis of autism spectrum disorder  Family Med/Psych History:  Family History  Problem Relation Age of Onset  . Clotting disorder Mother     Impression/DX:  Jaime [name] is a 24 year old male with a history of depression and well-documented high functioning autism.  The patient presented on 12/12/2019 with weakness, blurred vision and falls over the prior 3 months.  MRI showed patchy signal abnormalities within visualized distal cord consistent with demyelinating disease.  Patient will have follow-up neurological work-up due to suspected multiple sclerosis and the patient was placed on IV Solu-Medrol for 5 days with plan to follow-up with Dr. Epimenio Foot as an outpatient.  The patient also has a history of depression but denies any acute worsening of his depression.  There are also several tics noted today and there may be some Tourette's-like symptoms as well but the patient described them as his "stutter".  The patient was alert and oriented today and appeared to be well aware of current diagnostic considerations.  The patient was referred to the comprehensive inpatient rehabilitation unit due to multiple falls and debility-like symptoms in the area of depression.  Today there were also several occasions with tic-like presentations although the patient described them as his "stutter" I did not spend enough time with him to differentiate.  Disposition/Plan:  Today we  worked primarily on issues of adjusting to his recent diagnosis of multiple sclerosis apparently in his distal spinal cord region.  The patient has had multiple falls.  The patient appeared to be well aware of this preliminary diagnosis but is dealing with the complexities of an autistic spectrum disorder and depressive symptoms in conjunction with his recent diagnosis.  I will follow-up the patient first of next week.  Diagnosis:    Multiple sclerosis, mild autistic spectrum disorder, possible Tourette's, depressive disorder        Electronically Signed   _______________________ Arley Phenix, Psy.D.

## 2019-12-22 NOTE — Progress Notes (Signed)
Occupational Therapy Session Note  Patient Details  Name: Jaime [name] MRN: [id number] Date of Birth: 1996/03/24  Today's Date: 12/22/2019 OT Individual Time: 1000-1054 OT Individual Time Calculation (min): 54 min    Short Term Goals: Week 1:  OT Short Term Goal 1 (Week 1): STG = LTG  Skilled Therapeutic Interventions/Progress Updates:    1:1 Monitor BP during treatment today throughout ADL performance.   Orthostatic BP  Supine 111/66 HR 108 Sitting  105/ 66 HR118 Standing   85/68  HR 220  Pt reports feeling okay to precede because he needed to use the bathroom. Pt toileting in standing with supervision. Pt able to doffing clothing and transition into shower and shower sit to stand with supervision. Ambulated back out the bed for dressing with contact guard. Pt BP after shower still without TED was 105/80 HR 118 in sitting. After dressing in sitting with TEDS and ACE wraps on pt 111/ 74 in sitting. In standing was 96/ 73 (HR 155) with TEDS and ACES donned. REports he feels okay. Pt abl eto perform dressing with out hands on A. Pt left resting in the recliner on the phone with family.   Therapy Documentation Precautions:  Precautions Precautions: Fall Restrictions Weight Bearing Restrictions: No General:   Vital Signs: Therapy Vitals Pulse Rate: (!) 155 BP: 96/73 Patient Position (if appropriate): Standing(with TEDS and ACE wraps donned) Pain: Pain Assessment Pain Scale: 0-10 Pain Score: 0-No pain   Therapy/Group: Individual Therapy  Roney Mans [facility] 12/22/2019, 12:24 PM

## 2019-12-22 NOTE — Progress Notes (Signed)
Progress Note  Patient Name: Jaime [name] Date of Encounter: 12/22/2019  Primary Cardiologist: No primary care provider on file.   Subjective   Pt denies dizziness  No SOB    Inpatient Medications    Scheduled Meds: . enoxaparin (LOVENOX) injection  40 mg Subcutaneous Q24H  . midodrine  2.5 mg Oral TID WC  . pantoprazole  40 mg Oral Daily   Continuous Infusions:  PRN Meds: acetaminophen **OR** acetaminophen, ondansetron **OR** ondansetron (ZOFRAN) IV, sorbitol   Vital Signs    Vitals:   12/21/19 1727 12/21/19 2016 12/22/19 0518 12/22/19 0911  BP: 93/62 111/65 (!) 97/49 104/65  Pulse: 86 93 66 95  Resp: 18 16 16    Temp: 97.6 F (36.4 C) 98.6 F (37 C) 98.2 F (36.8 C)   TempSrc: Oral Oral    SpO2: 98% 98% 97%     Intake/Output Summary (Last 24 hours) at 12/22/2019 1204 Last data filed at 12/22/2019 0955 Gross per 24 hour  Intake 600 ml  Output 1950 ml  Net -1350 ml   There were no vitals filed for this visit.  Telemetry    No tele - Personally Reviewed  ECG    No EKG   Personally Reviewed  Physical Exam   GEN: Thin 24 yo in  No acute distress.   Neck: No JVD Cardiac: RRR, no murmurs, rubs, or gallops.  Respiratory: Clear to auscultation bilaterally. GI: Soft, nontender, non-distended  MS: No edema; No deformity. Neuro:  Nonfocal  Psych: Normal affect   Labs    Chemistry Recent Labs  Lab 12/16/19 0540 12/16/19 0540 12/17/19 0420 12/18/19 0409 12/21/19 0506  NA 138   < > 139 139 139  K 4.0   < > 3.9 3.9 4.3  CL 106   < > 100 102 102  CO2 27   < > 27 28 26   GLUCOSE 112*   < > 94 96 92  BUN 14   < > 15 16 16   CREATININE 0.87   < > 1.00 1.00 0.89  CALCIUM 8.8*   < > 8.7* 8.7* 9.0  PROT 5.8*  --  5.9* 6.0*  --   ALBUMIN 3.3*  --  3.3* 3.3*  --   AST 19  --  18 17  --   ALT 14  --  15 18  --   ALKPHOS 34*  --  37* 33*  --   BILITOT 0.5  --  0.4 0.8  --   GFRNONAA >60   < > >60 >60 >60  GFRAA >60   < > >60 >60 >60  ANIONGAP 5    < > 12 9 11    < > = values in this interval not displayed.     Hematology Recent Labs  Lab 12/17/19 0420 12/18/19 0409 12/21/19 0506  WBC 14.6* 13.4* 12.7*  RBC 4.50 4.73 4.56  HGB 14.2 15.0 14.3  HCT 40.8 43.8 41.6  MCV 90.7 92.6 91.2  MCH 31.6 31.7 31.4  MCHC 34.8 34.2 34.4  RDW 13.3 13.3 13.7  PLT 211 216 196    Cardiac EnzymesNo results for input(s): TROPONINI in the last 168 hours. No results for input(s): TROPIPOC in the last 168 hours.   BNPNo results for input(s): BNP, PROBNP in the last 168 hours.   DDimer No results for input(s): DDIMER in the last 168 hours.   Radiology    ECHOCARDIOGRAM COMPLETE  Result Date: 12/20/2019    ECHOCARDIOGRAM REPORT  Patient Name:   Jaime [name] Date of Exam: 12/20/2019 Medical Rec #:  [id number]         Height:       73.0 in Accession #:    [id number]        Weight:       120.0 lb Date of Birth:  October 30, 1995         BSA:          1.732 m Patient Age:    24 years          BP:           113/78 mmHg Patient Gender: M                 HR:           106 bpm. Exam Location:  Inpatient Procedure: 2D Echo                            MODIFIED REPORT: This report was modified by Lennie Odor MD on 12/20/2019 due to error.  Indications:     Tachycardia.  History:         Patient has no prior history of Echocardiogram examinations.                  Multiple sclerosis.  Sonographer:     Delcie Roch Referring Phys:  [id number] Beatriz Stallion Diagnosing Phys: Lennie Odor MD  Sonographer Comments: Image acquisition challenging due to respiratory motion. IMPRESSIONS  1. Left ventricular ejection fraction, by estimation, is 60 to 65%. The left ventricle has normal function. The left ventricle has no regional wall motion abnormalities. Left ventricular diastolic parameters were normal.  2. Right ventricular systolic function is normal. The right ventricular size is normal. Tricuspid regurgitation signal is inadequate for assessing PA pressure.  3.  Moderate pericardial effusion. The pericardial effusion is anterior to the right ventricle and posterior to the left ventricle. There is no evidence of cardiac tamponade.  4. The mitral valve is grossly normal. No evidence of mitral valve regurgitation. No evidence of mitral stenosis.  5. The aortic valve is tricuspid. Aortic valve regurgitation is not visualized. No aortic stenosis is present.  6. The inferior vena cava is normal in size with greater than 50% respiratory variability, suggesting right atrial pressure of 3 mmHg. FINDINGS  Left Ventricle: Left ventricular ejection fraction, by estimation, is 60 to 65%. The left ventricle has normal function. The left ventricle has no regional wall motion abnormalities. The left ventricular internal cavity size was normal in size. There is  no left ventricular hypertrophy. Left ventricular diastolic parameters were normal. Right Ventricle: The right ventricular size is normal. No increase in right ventricular wall thickness. Right ventricular systolic function is normal. Tricuspid regurgitation signal is inadequate for assessing PA pressure. Left Atrium: Left atrial size was normal in size. Right Atrium: Right atrial size was normal in size. Pericardium: A moderately sized pericardial effusion is present. The pericardial effusion is anterior to the right ventricle and posterior to the left ventricle. There is no evidence of cardiac tamponade. Presence of pericardial fat pad. Mitral Valve: The mitral valve is grossly normal. No evidence of mitral valve regurgitation. No evidence of mitral valve stenosis. Tricuspid Valve: The tricuspid valve is grossly normal. Tricuspid valve regurgitation is not demonstrated. No evidence of tricuspid stenosis. Aortic Valve: The aortic valve is tricuspid. Aortic valve regurgitation is not visualized. No aortic stenosis is  present. Pulmonic Valve: The pulmonic valve was grossly normal. Pulmonic valve regurgitation is not visualized. No  evidence of pulmonic stenosis. Aorta: The aortic root is normal in size and structure. Venous: The inferior vena cava is normal in size with greater than 50% respiratory variability, suggesting right atrial pressure of 3 mmHg. IAS/Shunts: No atrial level shunt detected by color flow Doppler.  LEFT VENTRICLE PLAX 2D LVIDd:         3.50 cm  Diastology LVIDs:         2.30 cm  LV e' lateral: 10.20 cm/s LV PW:         1.00 cm  LV e' medial:  9.79 cm/s LV IVS:        0.90 cm LVOT diam:     2.00 cm LV SV:         37 LV SV Index:   21 LVOT Area:     3.14 cm  RIGHT VENTRICLE TAPSE (M-mode): 1.7 cm LEFT ATRIUM           Index       RIGHT ATRIUM          Index LA diam:      2.10 cm 1.21 cm/m  RA Area:     7.62 cm LA Vol (A4C): 22.9 ml 13.22 ml/m RA Volume:   13.40 ml 7.74 ml/m  AORTIC VALVE LVOT Vmax:   80.90 cm/s LVOT Vmean:  51.600 cm/s LVOT VTI:    0.118 m  AORTA Ao Root diam: 2.70 cm  SHUNTS Systemic VTI:  0.12 m Systemic Diam: 2.00 cm Lennie Odor MD Electronically signed by Lennie Odor MD Signature Date/Time: 12/20/2019/5:41:45 PM    Final (Updated)     Cardiac Studies   2D echo 12/20/19 IMPRESSIONS   1. Left ventricular ejection fraction, by estimation, is 60 to 65%. The  left ventricle has normal function. The left ventricle has no regional  wall motion abnormalities. Left ventricular diastolic parameters were  normal.  2. Right ventricular systolic function is normal. The right ventricular  size is normal. Tricuspid regurgitation signal is inadequate for assessing  PA pressure.  3. Moderate pericardial effusion. The pericardial effusion is anterior to  the right ventricle and posterior to the left ventricle. There is no  evidence of cardiac tamponade.  4. The mitral valve is grossly normal. No evidence of mitral valve  regurgitation. No evidence of mitral stenosis.  5. The aortic valve is tricuspid. Aortic valve regurgitation is not  visualized. No aortic stenosis is present.  6. The  inferior vena cava is normal in size with greater than 50%  respiratory variability, suggesting right atrial pressure of 3 mmHg.   Patient Profile     24 y.o. male with a hx of autism and depression who was admitted 3/9 with progressive weakness and falling.  He was diagnosed with MS and started on IV steroids and discharged to CIR on 3/15.  Since then PT has been working with him and has noticed that his HR will jump up to the 150-170's when PT is working with him.  He also has had soft BPs with SBP in the 90's.    Assessment & Plan    1. Autonomic dysfunction   Pt with evidence of POTS on exam   Orthostatics today HR increased in 118 to 155  BP drop less than 10 pts   Currently on 2.5 mg midodine   WOuld:  Continue proamatine   Dosing is usually early AM, late  AM   Mid afternoon  Can increase dose but would follow for now  COnfirm that cortisol normal High salt diet. Elevate Head of bed  Abdominal binder     2  . MS: recently diagnosed. Currently admitted to CIR. - Plan to follow-up with neurology outpatient     For questions or updates, please contact [facility] Please consult [email or web address] for contact info under Cardiology/STEMI.      Signed, Dietrich Pates, MD  12/22/2019, 12:04 PM

## 2019-12-23 ENCOUNTER — Inpatient Hospital Stay (HOSPITAL_COMMUNITY): Payer: Medicaid Other

## 2019-12-23 ENCOUNTER — Inpatient Hospital Stay (HOSPITAL_COMMUNITY): Payer: Medicaid Other | Admitting: Physical Therapy

## 2019-12-23 MED ORDER — MIDODRINE HCL 5 MG PO TABS
5.0000 mg | ORAL_TABLET | ORAL | Status: DC
  Administered 2019-12-24 – 2019-12-26 (×8): 5 mg via ORAL
  Filled 2019-12-23 (×8): qty 1

## 2019-12-23 NOTE — Progress Notes (Signed)
Physical Therapy Session Note  Patient Details  Name: Jaime [name] MRN: [id number] Date of Birth: 01-19-1996  Today's Date: 12/23/2019 PT Individual Time: 6606-3016 PT Individual Time Calculation (min): 72 min   Short Term Goals: Week 1:  PT Short Term Goal 1 (Week 1): STG=LTG due to short ELOS.  Skilled Therapeutic Interventions/Progress Updates:     Patient in recliner in room on the phone with his sister upon PT arrival. Patient alert and agreeable to PT session. Patient denied pain during session. [name] called during session. Discussed patient's progress, PT goals, follow-up OPPT, and DME needs. She had questions about cardiac concerns and asked to speak with the rehab doctor and cardiologist. PT will reach out to [name] to have this requests met. Also discussed patient having 24/7 supervision upon d/c due to increased fall risk secondary to dizziness and gait deviations and elevated HR, patient's mother in agreement. Finally, discussed MS diagnosis and support groups and organizations to obtain more information. [name] very appreciative of conversation and education provided. Patient present and participated in conversation as well.  Donned abdominal binder in sitting with total A prior to mobility. Patient with B thigh high TED hose donned prior to and throughout session.   Orthostatic vitals:  Sitting: 97/65, HR 87 (asymptomatic) Standing: 120/56, HR 108 (asymptomatic) Standing x3 min: 104/73, HR 124 (asymptomatic)  Therapeutic Activity: Transfers: Patient performed sit to/from stand x6 with supervision for safety. Patient performed a simulated sedan height car transfer with supervision using car frame for stability performing step-in technique. Provided cues for safe technique. Patient ambulated to the bathroom, as described below, stood to void in the toilet with supervision for safety, and performed LB clothing management independently during session.  After he ambulated to the sink to wash his hands with supervision for safety.   Gait Training:  Patient ambulated 255 feet, 128 feet, and 142 feet without an AD with CGA for safety/balance. Ambulated with decreased gait speed compared to previous session, improved foot placement with only intermittent variability, and no LOB. Noted decreased speed on turns to prevent dizziness without cues this session. Provided verbal cues for scanning environment, increased arm swing, and increased gait speed for reduced SLS time for safety. Patient ascended/descended 22 steps (2 flights in the stairwell) using 1 rail with CGA-close supervision for safety. Performed reciprocal gait pattern throughout with decreased gait speed. Patient ambulated up/down a ramp, over 10 feet of mulch (unlevel surface), and up/down a curb to simulate community ambulation over unlevel surfaces with CGA without an AD. Provided cues for technique and use of AD. Patient with 1 LOB on mulch, but able to self-correct with only CGA.   Neuromuscular Re-ed: Patient performed the following dynamic gait activities: -Stepping over 6 canes x1 with CGA -weaving between 6 cones, ambulating over tri-folded blue mat x2, and stepping over 6 cones: first tiral 1 min, second trial 46 sec -ambulating with head turns with increasing speed ~50 feet x2, noted mild dizziness with activity that improved on second trial with increased speed of head turns  Patient in recliner in room at end of session with breaks locked, seat belt alarm set, and all needs within reach. Vitals at end of session: BP 123/78, HR 69 (asymptomatic). HR remained between 98-110 bpm and patient was asymptomatic with activity during session.    Therapy Documentation Precautions:  Precautions Precautions: Fall Restrictions Weight Bearing Restrictions: No    Therapy/Group: Individual Therapy  Jaime [name] [name] PT, DPT  12/23/2019, 4:56  PM  

## 2019-12-23 NOTE — Progress Notes (Signed)
[place] PHYSICAL MEDICINE & REHABILITATION PROGRESS NOTE   Subjective/Complaints:  Appreciate cardiology and Neuropsych note Standing on foam playing checkers with PT this am  ROS:   Pt denies SOB, abd pain, CP, N/V/D   Objective:   No results found. Recent Labs    12/21/19 0506  WBC 12.7*  HGB 14.3  HCT 41.6  PLT 196   Recent Labs    12/21/19 0506  NA 139  K 4.3  CL 102  CO2 26  GLUCOSE 92  BUN 16  CREATININE 0.89  CALCIUM 9.0    Intake/Output Summary (Last 24 hours) at 12/23/2019 0928 Last data filed at 12/23/2019 0915 Gross per 24 hour  Intake --  Output 1850 ml  Net -1850 ml     Physical Exam: Vital Signs Blood pressure (!) 91/58, pulse (!) 55, temperature 98.6 F (37 C), resp. rate 16, SpO2 99 %.   General: No acute distress Mood and affect are appropriate Heart: Regular rate and rhythm no rubs murmurs or extra sounds Lungs: Clear to auscultation, breathing unlabored, no rales or wheezes Abdomen: Positive bowel sounds, soft nontender to palpation, nondistended Extremities: No clubbing, cyanosis, or edema Skin: No evidence of breakdown, no evidence of rash Neurologic: Cranial nerves II through XII intact, motor strength is 4/5 in bilateral deltoid, bicep, tricep, grip, hip flexor, knee extensors, ankle dorsiflexor and plantar flexor  Cerebellar exam normal finger to nose to finger  in bilateral upper  Musculoskeletal: Full range of motion in all 4 extremities. No joint swelling     Assessment/Plan: 1. Functional deficits secondary to  Likely initial MS exacerbation which require 3+ hours per day of interdisciplinary therapy in a comprehensive inpatient rehab setting.  Physiatrist is providing close team supervision and 24 hour management of active medical problems listed below.  Physiatrist and rehab team continue to assess barriers to discharge/monitor patient progress toward functional and medical goals  Care Tool:  Bathing     Body parts bathed by patient: Right arm, Left arm, Chest, Abdomen, Front perineal area, Buttocks, Right upper leg, Left upper leg, Right lower leg, Left lower leg, Face         Bathing assist Assist Level: Contact Guard/Touching assist     Upper Body Dressing/Undressing Upper body dressing   What is the patient wearing?: Pull over shirt    Upper body assist Assist Level: Set up assist    Lower Body Dressing/Undressing Lower body dressing      What is the patient wearing?: Underwear/pull up, Pants     Lower body assist Assist for lower body dressing: Contact Guard/Touching assist     Toileting Toileting    Toileting assist Assist for toileting: Contact Guard/Touching assist     Transfers Chair/bed transfer  Transfers assist     Chair/bed transfer assist level: Contact Guard/Touching assist     Locomotion Ambulation   Ambulation assist      Assist level: Minimal Assistance - Patient > 75% Assistive device: No Device Max distance: 150'   Walk 10 feet activity   Assist     Assist level: Minimal Assistance - Patient > 75% Assistive device: No Device   Walk 50 feet activity   Assist    Assist level: Minimal Assistance - Patient > 75% Assistive device: No Device    Walk 150 feet activity   Assist    Assist level: Minimal Assistance - Patient > 75% Assistive device: No Device    Walk 10 feet on uneven surface  activity   Assist     Assist level: Minimal Assistance - Patient > 75% Assistive device: Hand held assist   Wheelchair     Assist Will patient use wheelchair at discharge?: No(Patient is a functional ambulator)   Wheelchair activity did not occur: N/A         Wheelchair 50 feet with 2 turns activity    Assist    Wheelchair 50 feet with 2 turns activity did not occur: N/A       Wheelchair 150 feet activity     Assist  Wheelchair 150 feet activity did not occur: N/A       Blood pressure (!) 91/58,  pulse (!) 55, temperature 98.6 F (37 C), resp. rate 16, SpO2 99 %.  Medical Problem List and Plan: 1.Decreased functional ability with diffuse weaknesssecondary to multiple sclerosis. IV Solu-Medrol completed. Plan follow-up outpatient Dr. Epimenio Foot. -patient may  shower -ELOS/Goals: 9-12d 2. Antithrombotics: -DVT/anticoagulation:Lovenox.Check vascular study 3/17- Dopplers (-) -antiplatelet therapy: N/A 3. Pain Management:Tylenol as needed 4. Mood:Provide emotional support -antipsychotic agents: N/A 5. Neuropsych: This patientiscapable of making decisions on hisown behalf. 6. Skin/Wound Care:Routine skin checks 7. Fluids/Electrolytes/Nutrition:Routine in and outs with follow-up chemistries 8. High-level functioning autism. Patient appears to be at baseline. Will discuss with mother and family. 9. Tachycardia     Cards consulting  pt's HR up to 160-170 with standing and BP low- had ECHO- Cards feel its autonomic dysfunction and needs TEDs, thigh high, Abd binder and Midodrine 2.5 mg TID- is ordered 10. Leukocytosis  3/16- WBC down to 13.4 from 14.6- due to solumedrol- will monitor for getting worse or Sx's of illness.   3/18- will recheck in AM  3/19- down to 11.6- doing better- con't regimen 11. Hypotension  Autonomic dysfunction d/t MS- cardiology consulting , appreciate note from Dr Tenny Craw 13. Constipation  3/17- LBM 6 days ago- doesn't feel constipated- will give Sorbitol and Miralax- if doesn't work, will need Mg citrate.   LBM 3/20- no laxatives        LOS: 6 days A FACE TO FACE EVALUATION WAS PERFORMED  Erick Colace 12/23/2019, 9:28 AM

## 2019-12-23 NOTE — Progress Notes (Signed)
Progress Note  Patient Name: Jaime [name] Date of Encounter: 12/23/2019  Primary Cardiologist: No primary care provider on file.   Subjective   Pt says he was dizzy at therapy  Not now   In chair    Inpatient Medications    Scheduled Meds: . enoxaparin (LOVENOX) injection  40 mg Subcutaneous Q24H  . midodrine  2.5 mg Oral 3 times per day  . pantoprazole  40 mg Oral Daily   Continuous Infusions:  PRN Meds: acetaminophen **OR** acetaminophen, ondansetron **OR** ondansetron (ZOFRAN) IV, sorbitol   Vital Signs    Vitals:   12/22/19 1517 12/22/19 2009 12/22/19 2040 12/23/19 0531  BP: 118/66 95/62  (!) 91/58  Pulse: (!) 107 (!) 102 93 (!) 55  Resp: 16 16  16   Temp: 98.2 F (36.8 C) 98.7 F (37.1 C)  98.6 F (37 C)  TempSrc:  Oral    SpO2: 98% 98% 97% 99%    Intake/Output Summary (Last 24 hours) at 12/23/2019 2703 Last data filed at 12/22/2019 2343 Gross per 24 hour  Intake --  Output 2200 ml  Net -2200 ml   There were no vitals filed for this visit.  Telemetry    No tele - Personally Reviewed  ECG    No EKG   Personally Reviewed  Physical Exam   GEN: Thin 24 yo in  No acute distress.   Neck  JVP is normal   Cardiac: RRR, no murmurs, rubs, or gallops.  Respiratory: Clear to auscultation bilaterally. GI: Soft, nontender, non-distended  MS: No edema; No deformity. Neuro:  Nonfocal  Psych: Normal affect   Labs    Chemistry Recent Labs  Lab 12/17/19 0420 12/18/19 0409 12/21/19 0506  NA 139 139 139  K 3.9 3.9 4.3  CL 100 102 102  CO2 27 28 26   GLUCOSE 94 96 92  BUN 15 16 16   CREATININE 1.00 1.00 0.89  CALCIUM 8.7* 8.7* 9.0  PROT 5.9* 6.0*  --   ALBUMIN 3.3* 3.3*  --   AST 18 17  --   ALT 15 18  --   ALKPHOS 37* 33*  --   BILITOT 0.4 0.8  --   GFRNONAA >60 >60 >60  GFRAA >60 >60 >60  ANIONGAP 12 9 11      Hematology Recent Labs  Lab 12/17/19 0420 12/18/19 0409 12/21/19 0506  WBC 14.6* 13.4* 12.7*  RBC 4.50 4.73 4.56  HGB  14.2 15.0 14.3  HCT 40.8 43.8 41.6  MCV 90.7 92.6 91.2  MCH 31.6 31.7 31.4  MCHC 34.8 34.2 34.4  RDW 13.3 13.3 13.7  PLT 211 216 196    Cardiac EnzymesNo results for input(s): TROPONINI in the last 168 hours. No results for input(s): TROPIPOC in the last 168 hours.   BNPNo results for input(s): BNP, PROBNP in the last 168 hours.   DDimer No results for input(s): DDIMER in the last 168 hours.   Radiology    No results found.  Cardiac Studies   2D echo 12/20/19 IMPRESSIONS   1. Left ventricular ejection fraction, by estimation, is 60 to 65%. The  left ventricle has normal function. The left ventricle has no regional  wall motion abnormalities. Left ventricular diastolic parameters were  normal.  2. Right ventricular systolic function is normal. The right ventricular  size is normal. Tricuspid regurgitation signal is inadequate for assessing  PA pressure.  3. Moderate pericardial effusion. The pericardial effusion is anterior to  the right ventricle and posterior  to the left ventricle. There is no  evidence of cardiac tamponade.  4. The mitral valve is grossly normal. No evidence of mitral valve  regurgitation. No evidence of mitral stenosis.  5. The aortic valve is tricuspid. Aortic valve regurgitation is not  visualized. No aortic stenosis is present.  6. The inferior vena cava is normal in size with greater than 50%  respiratory variability, suggesting right atrial pressure of 3 mmHg.   Patient Profile     24 y.o. male with a hx of autism and depression who was admitted 3/9 with progressive weakness and falling.  He was diagnosed with MS and started on IV steroids and discharged to CIR on 3/15.  Since then PT has been working with him and has noticed that his HR will jump up to the 150-170's when PT is working with him.  He also has had soft BPs with SBP in the 90's.    Assessment & Plan    1. Autonomic dysfunction  Pt with POTS  Midodrine started as well as  maneuvers to increase BP  Still BP is marginal  Will increase proamatine to 5 3x per day  (7/11/2) Again encouraged fluids (at least a few liters per day ) as well as increased salt.    UA on 12/21/19 was relatively   Concentrated   SHould be dilute      2  . MS: recently diagnosed. Currently admitted to CIR. - Plan to follow-up with neurology outpatient     For questions or updates, please contact [facility] Please consult [email or web address] for contact info under Cardiology/STEMI.      Signed, Dietrich Pates, MD  12/23/2019, 7:11 AM

## 2019-12-23 NOTE — Plan of Care (Signed)
  Problem: Consults Goal: RH GENERAL PATIENT EDUCATION Description: See Patient Education module for education specifics. Outcome: Progressing   Problem: RH BOWEL ELIMINATION Goal: RH STG MANAGE BOWEL WITH ASSISTANCE Description: STG Manage Bowel with mod I  Outcome: Progressing   Problem: RH BLADDER ELIMINATION Goal: RH STG MANAGE BLADDER WITH MEDICATION WITH ASSISTANCE Description: STG Manage Bladder With Medication With Assistance. Outcome: Progressing   Problem: RH SKIN INTEGRITY Goal: RH STG SKIN FREE OF INFECTION/BREAKDOWN Outcome: Progressing   Problem: RH SAFETY Goal: RH STG ADHERE TO SAFETY PRECAUTIONS W/ASSISTANCE/DEVICE Description: STG Adhere to Safety Precautions With Assistance/Device. Outcome: Progressing

## 2019-12-23 NOTE — Progress Notes (Signed)
Physical Therapy Session Note  Patient Details  Name: Jaime [name] MRN: [id number] Date of Birth: 09/30/1996  Today's Date: 12/23/2019 PT Individual Time: 0901-0959 PT Individual Time Calculation (min): 58 min   Short Term Goals: Week 1:  PT Short Term Goal 1 (Week 1): STG=LTG due to short ELOS.  Skilled Therapeutic Interventions/Progress Updates: Pt presents supine in bed and agreeable to therapy.  Pt wishes to use BR.  Thigh-high TED hose applied in supine and pt dons slipper socks Independently.  BP taken in supine at 114/71 and pt transfers sup to sit Independently.  BP taken at 98/54 w/o c/o dizziness.  Pt transfers sit to stand w/ supervision and stood for BP at 100/64.  Pt amb to BR and uses toilet standing w/ unsteadiness noted, 1 episode of grabbing wall w/ LOB.  Pt returned to w/c w/ min A and BP at 104/64.  HR increased in standing to 149 bpm and then to 106 seated, as well as tested manually.  Pt amb multiple trials w/o AD up to 150' and min A.  Pt amb w/ increased hesitation, but no LOB.  Pt stood on cushioned surface to perform "checkers" > 10' w/o c/o.  Pt amb into room and around bed to recliner chair.  Chair alarm on and all needs within reach.     Therapy Documentation Precautions:  Precautions Precautions: Fall Restrictions Weight Bearing Restrictions: No General:   Vital Signs:  Pain:  0/10 pain reported.   Therapy/Group: Individual Therapy  [name] 12/23/2019, 11:01 AM

## 2019-12-24 ENCOUNTER — Inpatient Hospital Stay (HOSPITAL_COMMUNITY): Payer: Medicaid Other

## 2019-12-24 ENCOUNTER — Inpatient Hospital Stay (HOSPITAL_COMMUNITY): Payer: Medicaid Other | Admitting: Occupational Therapy

## 2019-12-24 DIAGNOSIS — R000 Tachycardia, unspecified: Secondary | ICD-10-CM

## 2019-12-24 DIAGNOSIS — G903 Multi-system degeneration of the autonomic nervous system: Secondary | ICD-10-CM

## 2019-12-24 NOTE — Progress Notes (Signed)
Physical Therapy Session Note  Patient Details  Name: Jaime [name] MRN: [id number] Date of Birth: 03-05-1996  Today's Date: 12/24/2019 PT Individual Time: 1135-1205 and 1415-1540 PT Individual Time Calculation (min): 30 min and 85 min   Short Term Goals: Week 1:  PT Short Term Goal 1 (Week 1): STG=LTG due to short ELOS.  Skilled Therapeutic Interventions/Progress Updates:     Session 1: Patient in recliner with B thigh high TEDs and abdominal binder donned upon PT arrival. Patient alert and agreeable to PT session. Patient denied pain during session.  Orthostatic Vitals: Sitting: BP 100/71, HR 96 Standing: BP 103/62, HR 127 Standing x3 min: 126/75, HR 137 HR 125-154 while standing x3 min, patient denied symptoms  Therapeutic Activity: Transfers: Patient performed sit to/from stand x5 with supervision for safety. Provided verbal cues for reaching back to sit for safety.  Gait Training:  Patient ambulated 177 feet x2 without an AD with CGA-close supervision for safety. Ambulated with decreased gait speed, decreased B step length, variable foot placement R>L, and slow turns. Provided verbal cues for increased gait speed for reduced SLS time and increased propulsion, consistent foot placement, and scanning environment. HR 105-110 after and patient asymptomatic per report.  Neuromuscular Re-ed: Patient performed the following standing balance assessments: Berg Balance Scale: Patient demonstrates increased fall risk as noted by score of  49/56 on Berg Balance Scale.  (<36= high risk for falls, close to 100%; 37-45 significant >80%; 46-51 moderate >50%; 52-55 lower >25%)  Patient in recliner in room at end of session with breaks locked, seat belt alarm set, and all needs within reach. Discussed upcoming family education with his mother during session. Patient is excited to have his mother participate in therapy and to go outside this afternoon.  Session 2: Patient in recliner with B  thigh high TEDs and abdominal binder donned with his mother in the room present for family education upon PT arrival. Patient alert and agreeable to PT session. Patient denied pain during session. Focused session on family education on management of symptoms due to [facility] and tachycardia, home safety and fall risk/prevention, and home and community mobility.   Orthostatic Vitals: Sitting: BP 101/77, HR 101 (asymptomatic) Standing: BP 101/72, HR 107 (asymptomatic)  Therapeutic Activity: Transfers: Patient performed sit to/from stand from various surfaces with supervision-CGA. Provided verbal cues for reaching back to sit. Patient mother preferred HHA for transfers, patient in agreement that this technique was preferred to use of gait belt. PT educated on HHA with the other hand around his waste in standing for increased safety.  He performed a floor transfer x2. PT provided demonstration and education of signs/symptoms that would indicate calling emergency services. Patient initially perform a squat to sitting on the floor from standing then laid down and then performed a squat to stand from the floor. PT educated on increased risk of falling and initiating OH with this technique and instructed to perform lying to quadruped to crawl to furniture and perform quadruped to sitting transfer from the floor. Patient performed this with CGA-supervision and min cues for technique. Reinforced education about safety with this technique and reviewed critical signs/symptoms after a fall using teach-back method. Patient and his mother able to recall 6/6 together.  Gait Training:  Patient ambulated from his room to the elevators, went down to the second floor and descending 4 flights of stairs with CGA with a step-to gait pattern for safety using a L rail then took a standing rest break at bottom  of stairs during PT demonstration of sitting on stairs in the event of dizziness or unsteadiness. Patient with mild dizziness  when returning to ambulation and sat in a chair in the cafeteria to rest. Dizziness resolved <30 sec in sitting, HR 120s. He then ambulated to the [facility] entrance and outside over unlevel side walks, grass, stairs with 1 rail, and mulch to simulate community ambulation with CGA and min Ax2 for minor LOB. He took a seated rest break as PT educated on limited community mobility until BP and HR better controlled. Differed clearance for ambulating in a park to cardiologist and [place]. Patient then ambulated back to stair well where he ascended 22 steps (2 flights) with CGA and reciprocal gait pattern using a R rail, then he ambulated to the elevators and back to the gym.   PT demonstrated safe guarding technique for all mobility and patient's mother performed guarding/assist with all mobility with min cues and demonstrated safe technique throughout.   During session, educated patient's mother on providing CGA-supervision with all mobility at this time due to dizziness from [facility], elevated HR, and head turns as well as unsteady gait. [name] in agreement and has 24/7 assist arranged at this time. Discussed that patient performs car transfers at supervision level and bed mobility independently, however, did not demonstrate these activity for time management and energy conservation. Discussed monitoring vital signs at home when patient is symptomatic and recording occurrences of symptoms and vitals in a journal to be taken to therapy and doctor appointments. Also, educated patient on fall risk/prevention, home modifications to prevent falls, and activation of emergency services in the event of a fall during session. Patient and his mother receptive and appreciative of education provided.  Patient in recliner in the room at end of session with breaks locked, seat belt alarm set, and all needs within reach.    Therapy Documentation Precautions:  Precautions Precautions: Fall Restrictions Weight Bearing  Restrictions: No  Balance: Balance Balance Assessed: Yes Standardized Balance Assessment Standardized Balance Assessment: Berg Balance Test Berg Balance Test Sit to Stand: Able to stand without using hands and stabilize independently Standing Unsupported: Able to stand safely 2 minutes Sitting with Back Unsupported but Feet Supported on Floor or Stool: Able to sit safely and securely 2 minutes Stand to Sit: Sits safely with minimal use of hands Transfers: Able to transfer safely, minor use of hands Standing Unsupported with Eyes Closed: Able to stand 10 seconds safely Standing Ubsupported with Feet Together: Able to place feet together independently and stand 1 minute safely From Standing, Reach Forward with Outstretched Arm: Can reach forward >12 cm safely (5") From Standing Position, Pick up Object from Floor: Able to pick up shoe, needs supervision From Standing Position, Turn to Look Behind Over each Shoulder: Looks behind from both sides and weight shifts well Turn 360 Degrees: Able to turn 360 degrees safely but slowly Standing Unsupported, Alternately Place Feet on Step/Stool: Able to stand independently and safely and complete 8 steps in 20 seconds Standing Unsupported, One Foot in Front: Able to plae foot ahead of the other independently and hold 30 seconds Standing on One Leg: Able to lift leg independently and hold equal to or more than 3 seconds Total Score: 49  Therapy/Group: Individual Therapy  Yigit Norkus L Min Tunnell PT, DPT  12/24/2019, 5:36 PM

## 2019-12-24 NOTE — Progress Notes (Signed)
Occupational Therapy Session Note  Patient Details  Name: ARMOND CUTHRELL MRN: [id number] Date of Birth: Apr 22, 1996  Today's Date: 12/24/2019 OT Individual Time: 1000-1100 OT Individual Time Calculation (min): 60 min    Short Term Goals: Week 1:  OT Short Term Goal 1 (Week 1): STG = LTG  Skilled Therapeutic Interventions/Progress Updates:    Patient alert and ready for therapy session.  He is pleasant and cooperative, mild delay in response Vitals - sitting  BP 100/63  , HR  107             Standing   BP  73/57   , HR   153  (asymptomatic, abdominal binder on - no teds as he was hoping to get into the shower)            Return to sitting   BP  82/66  ,   HR 115   Sit to stand and SPT CG/CS.  Completed sponge bath seated at sink with set up and CS,   Dressing seated in arm chair:  UB Mod I,  LB  Mod I, CM CS    Grooming  Mod I (seated)  CS (in stance)         Ambulation in room to toilet CGA.    toileting CS Donned abdominal binder, teds (thigh high)           Sitting BP  109/72  HR  115         Standing  BP  107/78    HR  149   - nursing aware Ambulated to recliner with CGA at close of session, seat belt alarm set and call bell in reach.       Therapy Documentation Precautions:  Precautions Precautions: Fall Restrictions Weight Bearing Restrictions: No     Therapy/Group: Individual Therapy  Barrie Lyme 12/24/2019, 10:14 AM

## 2019-12-24 NOTE — Progress Notes (Signed)
Social Work Assessment and Plan   Patient Details  Name: Jaime [name] MRN: [id number] Date of Birth: 06/05/1996  Today's Date: 12/24/2019  Problem List:  Patient Active Problem List   Diagnosis Date Noted  . Autism spectrum disorder   . Tachycardia   . 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 Multiple sclerosis exacerbation ([place]) 12/17/2019  . Lower extremity weakness 12/12/2019  . 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 MS (multiple sclerosis) ([place]) 12/12/2019   Past Medical History:  Past Medical History:  Diagnosis Date  . Autism   . Depression    Past Surgical History: History reviewed. No pertinent surgical history. Social History:  reports that he has never smoked. He has never used smokeless tobacco. He reports that he does not drink alcohol or use drugs.  Family / Support Systems Marital Status: Single Spouse/Significant Other: N/A Children: N/A Other Supports: Mother- Dawn/mother [phone]) Anticipated Caregiver: Pt mother to be his priamry caregiver Ability/Limitations of Caregiver: Pt is responsible for 2 other children Caregiver Availability: 24/7 Family Dynamics: Pt lives in the home with his mother and two younger siblings: 64 y.o. sister (autistic) and 110 y.o. 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 Pt is good spirits. Recent Psychosocial Issues: Pt admits to a little  depression since being in the hospital and would like to speak with someone if possible. Psychiatric History: hx of depression Substance Abuse History: Denies  Patient / Family Perceptions, Expectations & Goals Pt/Family understanding of illness & functional limitations: Pt mother has a clear understanding of pt condition Premorbid pt/family roles/activities: Pt was independent with care needs; pt continued to have supervision despite being high functioning Anticipated changes in roles/activities/participation: Pt will continue to requrie 24/7 care Pt/family expectations/goals: "To regain a sense of balance."  [employer]: None Premorbid Home Care/DME Agencies: None Transportation available at discharge: Pt mother will d/c to home. Resource referrals recommended: Neuropsychology  Discharge Planning Living Arrangements: Parent, Other relatives Support Systems: Parent, Other relatives Type of Residence: Private residence Insurance Resources: Medicaid (specify county)([place]) Financial Resources: [employer] Financial Screen Referred: No Living Expenses: Education officer, community Management: Other (Comment)(mother) Does the patient have any problems obtaining your medications?: No Social Work Anticipated Follow Up Needs: HH/OP  Clinical Impression SW met with pt and pt mother in the room to introduce self, explain role, and discuss discharge process.   Rana Snare 12/24/2019, 5:45 PM

## 2019-12-24 NOTE — Plan of Care (Signed)
  Problem: Consults Goal: RH GENERAL PATIENT EDUCATION Description: See Patient Education module for education specifics. Outcome: Progressing   Problem: RH BOWEL ELIMINATION Goal: RH STG MANAGE BOWEL WITH ASSISTANCE Description: STG Manage Bowel with mod I  Outcome: Progressing Goal: RH STG MANAGE BOWEL W/MEDICATION W/ASSISTANCE Description: STG Manage Bowel with Medication with mod Assistance. Outcome: Progressing   Problem: RH SKIN INTEGRITY Goal: RH STG SKIN FREE OF INFECTION/BREAKDOWN Outcome: Progressing Goal: RH STG MAINTAIN SKIN INTEGRITY WITH ASSISTANCE Description: STG Maintain Skin Integrity With Assistance. Outcome: Progressing   Problem: RH SAFETY Goal: RH STG ADHERE TO SAFETY PRECAUTIONS W/ASSISTANCE/DEVICE Description: STG Adhere to Safety Precautions With Assistance/Device. Outcome: Progressing

## 2019-12-24 NOTE — Discharge Summary (Signed)
Physician Discharge Summary  Patient ID: Jaime [name] MRN: [id number] DOB/AGE: 26-Jan-1996 24 y.o.  Admit date: 12/17/2019 Discharge date: 12/26/2019  Discharge Diagnoses:  Principal Problem:   MS (multiple sclerosis) (HCC) Active Problems:   Lower extremity weakness   Multiple sclerosis exacerbation (HCC)   Tachycardia   Dysautonomia orthostatic hypotension syndrome (HCC)   Autism spectrum disorder DVT prophylaxis Constipation  Discharged Condition: Stable  Significant Diagnostic Studies: MR BRAIN W WO CONTRAST  Result Date: 12/16/2019 CLINICAL DATA:  Demyelinating disease. Abnormal MRI of the head 12/11/2019. Interval treatment with IV steroid. EXAM: MRI HEAD WITHOUT AND WITH CONTRAST TECHNIQUE: Multiplanar, multiecho pulse sequences of the brain and surrounding structures were obtained without and with intravenous contrast. CONTRAST:  5.58mL GADAVIST GADOBUTROL 1 MMOL/ML IV SOLN COMPARISON:  MR the head without and with contrast 12/11/2019 FINDINGS: Brain: Numerous periventricular and subcortical T2 hyperintensities bilaterally are slightly less well defined than on the prior exam. No new lesions or expansion are seen. Diffusion signal is mostly secondary to shine through. No significant restricted diffusion is present. Postcontrast imaging demonstrates no residual enhancement of the numerous T2 hyperintense lesions. The ventricles are of normal size. No significant extraaxial fluid collection is present. The internal auditory canals are within normal limits. Vascular: Flow is present in the major intracranial arteries. Skull and upper cervical spine: The craniocervical junction is normal. Upper cervical spine is within normal limits. Marrow signal is unremarkable. Sinuses/Orbits: The paranasal sinuses and mastoid air cells are clear. The globes and orbits are within normal limits. IMPRESSION: 1. Innumerable scattered white matter lesions are similar in size and number to the prior exam.  The lesions are slightly less well-defined than on the prior study. 2. Previously seen enhancement is no longer present. This likely represents some response to the interval therapy. 3. No new lesions or evidence for ongoing active demyelination. Electronically Signed   By: Marin Roberts M.D.   On: 12/16/2019 19:33   MR Brain W and Wo Contrast  Result Date: 12/11/2019 CLINICAL DATA:  Initial evaluation for progressive ataxia, concern for stroke, multiple sclerosis. EXAM: MRI HEAD WITHOUT AND WITH CONTRAST MRI CERVICAL, THORACIC, AND LUMBAR SPINE WITHOUT AND WITH CONTRAST TECHNIQUE: Multisequence MR imaging of the spine from the cervical spine to the sacrum was performed prior to and following IV contrast administration for evaluation of spinal metastatic disease. CONTRAST:  5mL GADAVIST GADOBUTROL 1 MMOL/ML IV SOLN COMPARISON:  None available. FINDINGS: MRI HEAD SPINE FINDINGS Brain: Cerebral volume within normal limits for age. Innumerable scattered foci of T2/FLAIR hyperintensity seen involving the periventricular, deep, and juxta cortical white matter of both cerebral hemispheres. Many of these foci are oriented perpendicular to the lateral ventricles. Extensive involvement of the deep gray nuclei, brainstem, and cerebellar peduncles. Associated multiple scattered T1 black holes. For reference purposes, largest focus seen at the posterior left centrum semi ovale and measures approximately 2 cm. 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 Extensive enhancement seen about multiple lesions following contrast administration, compatible with active demyelination. No evidence for acute or subacute infarct. No encephalomalacia to suggest chronic infarction. No evidence for acute or chronic intracranial hemorrhage. No mass lesion or mass effect. No hydrocephalus. No extra-axial fluid collection. Pituitary gland suprasellar region normal. Midline structures intact. Vascular: Major  intracranial vascular flow voids are maintained. Skull and upper cervical spine: Craniocervical junction normal. Bone marrow signal intensity within normal limits. No scalp soft tissue abnormality. Sinuses/Orbits: Globes and orbital soft tissues within normal limits. Scattered mucosal thickening noted throughout the  paranasal sinuses, with small volume layering fluid within the lateral recess of the left sphenoid sinus. No mastoid effusion. Inner ear structures grossly normal. Other: None. MRI CERVICAL SPINE FINDINGS Alignment: Straightening of the normal cervical lordosis. No listhesis. Vertebrae: Vertebral body height maintained without evidence for acute or chronic fracture. Bone marrow signal intensity normal. No discrete or worrisome osseous lesions. No abnormal marrow edema or enhancement. Cord: Extensive signal abnormality seen involving the majority of the cervical spine, extending from the cervicomedullary junction through the upper thoracic spine, compatible with demyelinating disease/multiple sclerosis. For reference purposes, most prominent of these lesions involves the right dorsal cord at the level of C2 (series 7, image 3). No definite associated enhancement to suggest active demyelination, although evaluation somewhat limited by motion artifact. Underlying cord is somewhat atrophic in appearance. Posterior Fossa, vertebral arteries, paraspinal tissues: Unremarkable. Disc levels: No significant disc pathology seen within the cervical spine. No significant facet degeneration. No canal or neural foraminal stenosis. No impingement. MRI THORACIC SPINE FINDINGS Alignment: Physiologic with preservation of the normal thoracic kyphosis. No listhesis. Vertebrae: Vertebral body height maintained without evidence for acute or chronic fracture. Bone marrow signal intensity within normal limits. No discrete or worrisome osseous lesions. No abnormal marrow edema or enhancement. Cord: Extensive signal abnormality  seen throughout the thoracic spinal cord, essentially involving nearly all levels. Finding consistent with demyelinating disease. Patchy postcontrast enhancement involving the cord at the level of T1-2 consistent with active demyelination (series 32, image 8). Suspected additional faint patchy post-contrast enhancement at the level of T10-11 (series 32, image 8). Underlying cord is mildly atrophic in appearance. Paraspinal and other soft tissues: Unremarkable. Disc levels: T6-7: Small central disc protrusion indents the ventral thecal sac, contacting and minimally flattening the ventral cord. No significant stenosis. No other significant disc pathology seen within the thoracic spine. No other stenosis or neural impingement. MRI LUMBAR SPINE FINDINGS Segmentation:  Standard. Alignment:  Physiologic. Vertebrae: Vertebral body height maintained without evidence for acute or chronic fracture. Bone marrow signal intensity normal. No discrete or worrisome osseous lesions. No abnormal marrow edema or enhancement. Conus medullaris: Extends to the L1-2 level. Patchy signal abnormality seen within the distal cord, consistent with demyelinating disease. Finding better evaluated on concomitant thoracic spine MRI. No appreciable abnormal enhancement. Nerve roots of the cauda equina within normal limits. Paraspinal and other soft tissues: Unremarkable. Disc levels: L1-2:  Unremarkable. L2-3:  Unremarkable. L3-4:  Unremarkable. L4-5:  Unremarkable. L5-S1: Tiny central disc protrusion with slight inferior migration. No significant spinal stenosis or neural impingement. Foramina remain patent. IMPRESSION: MRI HEAD IMPRESSION: Extensive T2/FLAIR signal abnormality scattered throughout the supratentorial and infratentorial brain, consistent with demyelinating disease/multiple sclerosis. Associated enhancement about numerous lesions, consistent with active demyelination. MRI CERVICAL SPINE IMPRESSION: Extensive signal abnormality  involving essentially the majority of the cervical spinal cord, consistent with demyelinating disease. No abnormal enhancement to suggest active demyelination. MRI THORACIC SPINE IMPRESSION: 1. Extensive signal abnormality involving essentially the entirety of the thoracic spinal cord, consistent with demyelinating disease. Patchy postcontrast enhancement at the level of T1-2 and T10-11 consistent with active demyelination. 2. Small central disc protrusion at T6-7 without significant stenosis. MRI LUMBAR SPINE IMPRESSION: 1. Patchy signal abnormality within the visualized distal cord, consistent with demyelinating disease. No abnormal enhancement to suggest active demyelination. 2. Tiny central disc protrusion at L5-S1 without stenosis or impingement. Electronically Signed   By: Jeannine Boga M.D.   On: 12/11/2019 22:35   MR CERVICAL SPINE W WO CONTRAST  Result  Date: 12/11/2019 CLINICAL DATA:  Initial evaluation for progressive ataxia, concern for stroke, multiple sclerosis. EXAM: MRI HEAD WITHOUT AND WITH CONTRAST MRI CERVICAL, THORACIC, AND LUMBAR SPINE WITHOUT AND WITH CONTRAST TECHNIQUE: Multisequence MR imaging of the spine from the cervical spine to the sacrum was performed prior to and following IV contrast administration for evaluation of spinal metastatic disease. CONTRAST:  5mL GADAVIST GADOBUTROL 1 MMOL/ML IV SOLN COMPARISON:  None available. FINDINGS: MRI HEAD SPINE FINDINGS Brain: Cerebral volume within normal limits for age. Innumerable scattered foci of T2/FLAIR hyperintensity seen involving the periventricular, deep, and juxta cortical white matter of both cerebral hemispheres. Many of these foci are oriented perpendicular to the lateral ventricles. Extensive involvement of the deep gray nuclei, brainstem, and cerebellar peduncles. Associated multiple scattered T1 black holes. For reference purposes, largest focus seen at the posterior left centrum semi ovale and measures approximately 2  cm. 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 Extensive enhancement seen about multiple lesions following contrast administration, compatible with active demyelination. No evidence for acute or subacute infarct. No encephalomalacia to suggest chronic infarction. No evidence for acute or chronic intracranial hemorrhage. No mass lesion or mass effect. No hydrocephalus. No extra-axial fluid collection. Pituitary gland suprasellar region normal. Midline structures intact. Vascular: Major intracranial vascular flow voids are maintained. Skull and upper cervical spine: Craniocervical junction normal. Bone marrow signal intensity within normal limits. No scalp soft tissue abnormality. Sinuses/Orbits: Globes and orbital soft tissues within normal limits. Scattered mucosal thickening noted throughout the paranasal sinuses, with small volume layering fluid within the lateral recess of the left sphenoid sinus. No mastoid effusion. Inner ear structures grossly normal. Other: None. MRI CERVICAL SPINE FINDINGS Alignment: Straightening of the normal cervical lordosis. No listhesis. Vertebrae: Vertebral body height maintained without evidence for acute or chronic fracture. Bone marrow signal intensity normal. No discrete or worrisome osseous lesions. No abnormal marrow edema or enhancement. Cord: Extensive signal abnormality seen involving the majority of the cervical spine, extending from the cervicomedullary junction through the upper thoracic spine, compatible with demyelinating disease/multiple sclerosis. For reference purposes, most prominent of these lesions involves the right dorsal cord at the level of C2 (series 7, image 3). No definite associated enhancement to suggest active demyelination, although evaluation somewhat limited by motion artifact. Underlying cord is somewhat atrophic in appearance. Posterior Fossa, vertebral arteries, paraspinal tissues: Unremarkable. Disc levels: No significant  disc pathology seen within the cervical spine. No significant facet degeneration. No canal or neural foraminal stenosis. No impingement. MRI THORACIC SPINE FINDINGS Alignment: Physiologic with preservation of the normal thoracic kyphosis. No listhesis. Vertebrae: Vertebral body height maintained without evidence for acute or chronic fracture. Bone marrow signal intensity within normal limits. No discrete or worrisome osseous lesions. No abnormal marrow edema or enhancement. Cord: Extensive signal abnormality seen throughout the thoracic spinal cord, essentially involving nearly all levels. Finding consistent with demyelinating disease. Patchy postcontrast enhancement involving the cord at the level of T1-2 consistent with active demyelination (series 32, image 8). Suspected additional faint patchy post-contrast enhancement at the level of T10-11 (series 32, image 8). Underlying cord is mildly atrophic in appearance. Paraspinal and other soft tissues: Unremarkable. Disc levels: T6-7: Small central disc protrusion indents the ventral thecal sac, contacting and minimally flattening the ventral cord. No significant stenosis. No other significant disc pathology seen within the thoracic spine. No other stenosis or neural impingement. MRI LUMBAR SPINE FINDINGS Segmentation:  Standard. Alignment:  Physiologic. Vertebrae: Vertebral body height maintained without evidence for acute or chronic fracture.  Bone marrow signal intensity normal. No discrete or worrisome osseous lesions. No abnormal marrow edema or enhancement. Conus medullaris: Extends to the L1-2 level. Patchy signal abnormality seen within the distal cord, consistent with demyelinating disease. Finding better evaluated on concomitant thoracic spine MRI. No appreciable abnormal enhancement. Nerve roots of the cauda equina within normal limits. Paraspinal and other soft tissues: Unremarkable. Disc levels: L1-2:  Unremarkable. L2-3:  Unremarkable. L3-4:   Unremarkable. L4-5:  Unremarkable. L5-S1: Tiny central disc protrusion with slight inferior migration. No significant spinal stenosis or neural impingement. Foramina remain patent. IMPRESSION: MRI HEAD IMPRESSION: Extensive T2/FLAIR signal abnormality scattered throughout the supratentorial and infratentorial brain, consistent with demyelinating disease/multiple sclerosis. Associated enhancement about numerous lesions, consistent with active demyelination. MRI CERVICAL SPINE IMPRESSION: Extensive signal abnormality involving essentially the majority of the cervical spinal cord, consistent with demyelinating disease. No abnormal enhancement to suggest active demyelination. MRI THORACIC SPINE IMPRESSION: 1. Extensive signal abnormality involving essentially the entirety of the thoracic spinal cord, consistent with demyelinating disease. Patchy postcontrast enhancement at the level of T1-2 and T10-11 consistent with active demyelination. 2. Small central disc protrusion at T6-7 without significant stenosis. MRI LUMBAR SPINE IMPRESSION: 1. Patchy signal abnormality within the visualized distal cord, consistent with demyelinating disease. No abnormal enhancement to suggest active demyelination. 2. Tiny central disc protrusion at L5-S1 without stenosis or impingement. Electronically Signed   By: Rise Mu M.D.   On: 12/11/2019 22:35   MR THORACIC SPINE W WO CONTRAST  Result Date: 12/16/2019 CLINICAL DATA:  Diffuse demyelination. Follow-up after steroid therapy. EXAM: MRI THORACIC WITHOUT AND WITH CONTRAST TECHNIQUE: Multiplanar and multiecho pulse sequences of the thoracic spine were obtained without and with intravenous contrast. CONTRAST:  5.69mL GADAVIST GADOBUTROL 1 MMOL/ML IV SOLN COMPARISON:  MRI of the thoracic spine 12/11/19 FINDINGS: MRI THORACIC SPINE FINDINGS Alignment:  No significant listhesis is present. Vertebrae: Marrow signal and vertebral body heights are normal. Cord: Faint enhancement on  the right at T1-2 is near completely resolved. No enhancement remains in the lower thoracic spine. Numerous T2 hyperintensities within the cord slightly less well defined than on the prior exam. No new lesions are present. The cord is not expanded. Paraspinal and other soft tissues: Paraspinous soft tissues are within limits. Lungs are clear. Disc levels: Focal disc disease at T4-5 and T6-7 is again noted. The central protrusion at T6-7 contacts and slightly distorts the ventral surface the cord. Foramina are patent bilaterally. IMPRESSION: 1. Extensive T2 lesions throughout the thoracic spinal cord is slightly less well-defined than on the prior exam. 2. Previously noted enhancement at the T1-2 level is near completely resolved. This likely reflects response to steroid treatment. 3. No evidence for disease progression. Electronically Signed   By: Marin Roberts M.D.   On: 12/16/2019 19:39   MR THORACIC SPINE W WO CONTRAST  Result Date: 12/11/2019 CLINICAL DATA:  Initial evaluation for progressive ataxia, concern for stroke, multiple sclerosis. EXAM: MRI HEAD WITHOUT AND WITH CONTRAST MRI CERVICAL, THORACIC, AND LUMBAR SPINE WITHOUT AND WITH CONTRAST TECHNIQUE: Multisequence MR imaging of the spine from the cervical spine to the sacrum was performed prior to and following IV contrast administration for evaluation of spinal metastatic disease. CONTRAST:  5mL GADAVIST GADOBUTROL 1 MMOL/ML IV SOLN COMPARISON:  None available. FINDINGS: MRI HEAD SPINE FINDINGS Brain: Cerebral volume within normal limits for age. Innumerable scattered foci of T2/FLAIR hyperintensity seen involving the periventricular, deep, and juxta cortical white matter of both cerebral hemispheres. Many of these foci are  oriented perpendicular to the lateral ventricles. Extensive involvement of the deep gray nuclei, brainstem, and cerebellar peduncles. Associated multiple scattered T1 black holes. For reference purposes, largest focus seen at  the posterior left centrum semi ovale and measures approximately 2 cm. 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 Extensive enhancement seen about multiple lesions following contrast administration, compatible with active demyelination. No evidence for acute or subacute infarct. No encephalomalacia to suggest chronic infarction. No evidence for acute or chronic intracranial hemorrhage. No mass lesion or mass effect. No hydrocephalus. No extra-axial fluid collection. Pituitary gland suprasellar region normal. Midline structures intact. Vascular: Major intracranial vascular flow voids are maintained. Skull and upper cervical spine: Craniocervical junction normal. Bone marrow signal intensity within normal limits. No scalp soft tissue abnormality. Sinuses/Orbits: Globes and orbital soft tissues within normal limits. Scattered mucosal thickening noted throughout the paranasal sinuses, with small volume layering fluid within the lateral recess of the left sphenoid sinus. No mastoid effusion. Inner ear structures grossly normal. Other: None. MRI CERVICAL SPINE FINDINGS Alignment: Straightening of the normal cervical lordosis. No listhesis. Vertebrae: Vertebral body height maintained without evidence for acute or chronic fracture. Bone marrow signal intensity normal. No discrete or worrisome osseous lesions. No abnormal marrow edema or enhancement. Cord: Extensive signal abnormality seen involving the majority of the cervical spine, extending from the cervicomedullary junction through the upper thoracic spine, compatible with demyelinating disease/multiple sclerosis. For reference purposes, most prominent of these lesions involves the right dorsal cord at the level of C2 (series 7, image 3). No definite associated enhancement to suggest active demyelination, although evaluation somewhat limited by motion artifact. Underlying cord is somewhat atrophic in appearance. Posterior Fossa, vertebral  arteries, paraspinal tissues: Unremarkable. Disc levels: No significant disc pathology seen within the cervical spine. No significant facet degeneration. No canal or neural foraminal stenosis. No impingement. MRI THORACIC SPINE FINDINGS Alignment: Physiologic with preservation of the normal thoracic kyphosis. No listhesis. Vertebrae: Vertebral body height maintained without evidence for acute or chronic fracture. Bone marrow signal intensity within normal limits. No discrete or worrisome osseous lesions. No abnormal marrow edema or enhancement. Cord: Extensive signal abnormality seen throughout the thoracic spinal cord, essentially involving nearly all levels. Finding consistent with demyelinating disease. Patchy postcontrast enhancement involving the cord at the level of T1-2 consistent with active demyelination (series 32, image 8). Suspected additional faint patchy post-contrast enhancement at the level of T10-11 (series 32, image 8). Underlying cord is mildly atrophic in appearance. Paraspinal and other soft tissues: Unremarkable. Disc levels: T6-7: Small central disc protrusion indents the ventral thecal sac, contacting and minimally flattening the ventral cord. No significant stenosis. No other significant disc pathology seen within the thoracic spine. No other stenosis or neural impingement. MRI LUMBAR SPINE FINDINGS Segmentation:  Standard. Alignment:  Physiologic. Vertebrae: Vertebral body height maintained without evidence for acute or chronic fracture. Bone marrow signal intensity normal. No discrete or worrisome osseous lesions. No abnormal marrow edema or enhancement. Conus medullaris: Extends to the L1-2 level. Patchy signal abnormality seen within the distal cord, consistent with demyelinating disease. Finding better evaluated on concomitant thoracic spine MRI. No appreciable abnormal enhancement. Nerve roots of the cauda equina within normal limits. Paraspinal and other soft tissues: Unremarkable.  Disc levels: L1-2:  Unremarkable. L2-3:  Unremarkable. L3-4:  Unremarkable. L4-5:  Unremarkable. L5-S1: Tiny central disc protrusion with slight inferior migration. No significant spinal stenosis or neural impingement. Foramina remain patent. IMPRESSION: MRI HEAD IMPRESSION: Extensive T2/FLAIR signal abnormality scattered throughout the supratentorial and infratentorial brain,  consistent with demyelinating disease/multiple sclerosis. Associated enhancement about numerous lesions, consistent with active demyelination. MRI CERVICAL SPINE IMPRESSION: Extensive signal abnormality involving essentially the majority of the cervical spinal cord, consistent with demyelinating disease. No abnormal enhancement to suggest active demyelination. MRI THORACIC SPINE IMPRESSION: 1. Extensive signal abnormality involving essentially the entirety of the thoracic spinal cord, consistent with demyelinating disease. Patchy postcontrast enhancement at the level of T1-2 and T10-11 consistent with active demyelination. 2. Small central disc protrusion at T6-7 without significant stenosis. MRI LUMBAR SPINE IMPRESSION: 1. Patchy signal abnormality within the visualized distal cord, consistent with demyelinating disease. No abnormal enhancement to suggest active demyelination. 2. Tiny central disc protrusion at L5-S1 without stenosis or impingement. Electronically Signed   By: Rise Mu M.D.   On: 12/11/2019 22:35   MR Lumbar Spine W Wo Contrast  Result Date: 12/11/2019 CLINICAL DATA:  Initial evaluation for progressive ataxia, concern for stroke, multiple sclerosis. EXAM: MRI HEAD WITHOUT AND WITH CONTRAST MRI CERVICAL, THORACIC, AND LUMBAR SPINE WITHOUT AND WITH CONTRAST TECHNIQUE: Multisequence MR imaging of the spine from the cervical spine to the sacrum was performed prior to and following IV contrast administration for evaluation of spinal metastatic disease. CONTRAST:  5mL GADAVIST GADOBUTROL 1 MMOL/ML IV SOLN COMPARISON:   None available. FINDINGS: MRI HEAD SPINE FINDINGS Brain: Cerebral volume within normal limits for age. Innumerable scattered foci of T2/FLAIR hyperintensity seen involving the periventricular, deep, and juxta cortical white matter of both cerebral hemispheres. Many of these foci are oriented perpendicular to the lateral ventricles. Extensive involvement of the deep gray nuclei, brainstem, and cerebellar peduncles. Associated multiple scattered T1 black holes. For reference purposes, largest focus seen at the posterior left centrum semi ovale and measures approximately 2 cm. 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 Extensive enhancement seen about multiple lesions following contrast administration, compatible with active demyelination. No evidence for acute or subacute infarct. No encephalomalacia to suggest chronic infarction. No evidence for acute or chronic intracranial hemorrhage. No mass lesion or mass effect. No hydrocephalus. No extra-axial fluid collection. Pituitary gland suprasellar region normal. Midline structures intact. Vascular: Major intracranial vascular flow voids are maintained. Skull and upper cervical spine: Craniocervical junction normal. Bone marrow signal intensity within normal limits. No scalp soft tissue abnormality. Sinuses/Orbits: Globes and orbital soft tissues within normal limits. Scattered mucosal thickening noted throughout the paranasal sinuses, with small volume layering fluid within the lateral recess of the left sphenoid sinus. No mastoid effusion. Inner ear structures grossly normal. Other: None. MRI CERVICAL SPINE FINDINGS Alignment: Straightening of the normal cervical lordosis. No listhesis. Vertebrae: Vertebral body height maintained without evidence for acute or chronic fracture. Bone marrow signal intensity normal. No discrete or worrisome osseous lesions. No abnormal marrow edema or enhancement. Cord: Extensive signal abnormality seen involving  the majority of the cervical spine, extending from the cervicomedullary junction through the upper thoracic spine, compatible with demyelinating disease/multiple sclerosis. For reference purposes, most prominent of these lesions involves the right dorsal cord at the level of C2 (series 7, image 3). No definite associated enhancement to suggest active demyelination, although evaluation somewhat limited by motion artifact. Underlying cord is somewhat atrophic in appearance. Posterior Fossa, vertebral arteries, paraspinal tissues: Unremarkable. Disc levels: No significant disc pathology seen within the cervical spine. No significant facet degeneration. No canal or neural foraminal stenosis. No impingement. MRI THORACIC SPINE FINDINGS Alignment: Physiologic with preservation of the normal thoracic kyphosis. No listhesis. Vertebrae: Vertebral body height maintained without evidence for acute or chronic fracture. Bone  marrow signal intensity within normal limits. No discrete or worrisome osseous lesions. No abnormal marrow edema or enhancement. Cord: Extensive signal abnormality seen throughout the thoracic spinal cord, essentially involving nearly all levels. Finding consistent with demyelinating disease. Patchy postcontrast enhancement involving the cord at the level of T1-2 consistent with active demyelination (series 32, image 8). Suspected additional faint patchy post-contrast enhancement at the level of T10-11 (series 32, image 8). Underlying cord is mildly atrophic in appearance. Paraspinal and other soft tissues: Unremarkable. Disc levels: T6-7: Small central disc protrusion indents the ventral thecal sac, contacting and minimally flattening the ventral cord. No significant stenosis. No other significant disc pathology seen within the thoracic spine. No other stenosis or neural impingement. MRI LUMBAR SPINE FINDINGS Segmentation:  Standard. Alignment:  Physiologic. Vertebrae: Vertebral body height maintained  without evidence for acute or chronic fracture. Bone marrow signal intensity normal. No discrete or worrisome osseous lesions. No abnormal marrow edema or enhancement. Conus medullaris: Extends to the L1-2 level. Patchy signal abnormality seen within the distal cord, consistent with demyelinating disease. Finding better evaluated on concomitant thoracic spine MRI. No appreciable abnormal enhancement. Nerve roots of the cauda equina within normal limits. Paraspinal and other soft tissues: Unremarkable. Disc levels: L1-2:  Unremarkable. L2-3:  Unremarkable. L3-4:  Unremarkable. L4-5:  Unremarkable. L5-S1: Tiny central disc protrusion with slight inferior migration. No significant spinal stenosis or neural impingement. Foramina remain patent. IMPRESSION: MRI HEAD IMPRESSION: Extensive T2/FLAIR signal abnormality scattered throughout the supratentorial and infratentorial brain, consistent with demyelinating disease/multiple sclerosis. Associated enhancement about numerous lesions, consistent with active demyelination. MRI CERVICAL SPINE IMPRESSION: Extensive signal abnormality involving essentially the majority of the cervical spinal cord, consistent with demyelinating disease. No abnormal enhancement to suggest active demyelination. MRI THORACIC SPINE IMPRESSION: 1. Extensive signal abnormality involving essentially the entirety of the thoracic spinal cord, consistent with demyelinating disease. Patchy postcontrast enhancement at the level of T1-2 and T10-11 consistent with active demyelination. 2. Small central disc protrusion at T6-7 without significant stenosis. MRI LUMBAR SPINE IMPRESSION: 1. Patchy signal abnormality within the visualized distal cord, consistent with demyelinating disease. No abnormal enhancement to suggest active demyelination. 2. Tiny central disc protrusion at L5-S1 without stenosis or impingement. Electronically Signed   By: Rise Mu M.D.   On: 12/11/2019 22:35   DG CHEST PORT 1  VIEW  Result Date: 12/12/2019 CLINICAL DATA:  Increasing weakness EXAM: PORTABLE CHEST 1 VIEW COMPARISON:  None. FINDINGS: The heart size and mediastinal contours are within normal limits. Both lungs are clear. The visualized skeletal structures are unremarkable. IMPRESSION: No active disease. Electronically Signed   By: Marnee Spring M.D.   On: 12/12/2019 06:22   ECHOCARDIOGRAM COMPLETE  Result Date: 12/20/2019    ECHOCARDIOGRAM REPORT   Patient Name:   ROSHAD HACK Date of Exam: 12/20/2019 Medical Rec #:  [id number]         Height:       73.0 in Accession #:    [id number]        Weight:       120.0 lb Date of Birth:  [birth year]         BSA:          1.732 m Patient Age:    23 years          BP:           113/78 mmHg Patient Gender: M                 HR:  106 bpm. Exam Location:  Inpatient Procedure: 2D Echo                            MODIFIED REPORT: This report was modified by Lennie Odor MD on 12/20/2019 due to error.  Indications:     Tachycardia.  History:         Patient has no prior history of Echocardiogram examinations.                  Multiple sclerosis.  Sonographer:     Delcie Roch Referring Phys:  [id number] Beatriz Stallion Diagnosing Phys: Lennie Odor MD  Sonographer Comments: Image acquisition challenging due to respiratory motion. IMPRESSIONS  1. Left ventricular ejection fraction, by estimation, is 60 to 65%. The left ventricle has normal function. The left ventricle has no regional wall motion abnormalities. Left ventricular diastolic parameters were normal.  2. Right ventricular systolic function is normal. The right ventricular size is normal. Tricuspid regurgitation signal is inadequate for assessing PA pressure.  3. Moderate pericardial effusion. The pericardial effusion is anterior to the right ventricle and posterior to the left ventricle. There is no evidence of cardiac tamponade.  4. The mitral valve is grossly normal. No evidence of mitral valve regurgitation.  No evidence of mitral stenosis.  5. The aortic valve is tricuspid. Aortic valve regurgitation is not visualized. No aortic stenosis is present.  6. The inferior vena cava is normal in size with greater than 50% respiratory variability, suggesting right atrial pressure of 3 mmHg. FINDINGS  Left Ventricle: Left ventricular ejection fraction, by estimation, is 60 to 65%. The left ventricle has normal function. The left ventricle has no regional wall motion abnormalities. The left ventricular internal cavity size was normal in size. There is  no left ventricular hypertrophy. Left ventricular diastolic parameters were normal. Right Ventricle: The right ventricular size is normal. No increase in right ventricular wall thickness. Right ventricular systolic function is normal. Tricuspid regurgitation signal is inadequate for assessing PA pressure. Left Atrium: Left atrial size was normal in size. Right Atrium: Right atrial size was normal in size. Pericardium: A moderately sized pericardial effusion is present. The pericardial effusion is anterior to the right ventricle and posterior to the left ventricle. There is no evidence of cardiac tamponade. Presence of pericardial fat pad. Mitral Valve: The mitral valve is grossly normal. No evidence of mitral valve regurgitation. No evidence of mitral valve stenosis. Tricuspid Valve: The tricuspid valve is grossly normal. Tricuspid valve regurgitation is not demonstrated. No evidence of tricuspid stenosis. Aortic Valve: The aortic valve is tricuspid. Aortic valve regurgitation is not visualized. No aortic stenosis is present. Pulmonic Valve: The pulmonic valve was grossly normal. Pulmonic valve regurgitation is not visualized. No evidence of pulmonic stenosis. Aorta: The aortic root is normal in size and structure. Venous: The inferior vena cava is normal in size with greater than 50% respiratory variability, suggesting right atrial pressure of 3 mmHg. IAS/Shunts: No atrial level  shunt detected by color flow Doppler.  LEFT VENTRICLE PLAX 2D LVIDd:         3.50 cm  Diastology LVIDs:         2.30 cm  LV e' lateral: 10.20 cm/s LV PW:         1.00 cm  LV e' medial:  9.79 cm/s LV IVS:        0.90 cm LVOT diam:     2.00 cm LV SV:  37 LV SV Index:   21 LVOT Area:     3.14 cm  RIGHT VENTRICLE TAPSE (M-mode): 1.7 cm LEFT ATRIUM           Index       RIGHT ATRIUM          Index LA diam:      2.10 cm 1.21 cm/m  RA Area:     7.62 cm LA Vol (A4C): 22.9 ml 13.22 ml/m RA Volume:   13.40 ml 7.74 ml/m  AORTIC VALVE LVOT Vmax:   80.90 cm/s LVOT Vmean:  51.600 cm/s LVOT VTI:    0.118 m  AORTA Ao Root diam: 2.70 cm  SHUNTS Systemic VTI:  0.12 m Systemic Diam: 2.00 cm Lennie Odor MD Electronically signed by Lennie Odor MD Signature Date/Time: 12/20/2019/5:41:45 PM    Final (Updated)    VAS [place] LOWER EXTREMITY VENOUS (DVT)  Result Date: 12/18/2019  Lower Venous DVTStudy Indications: Fall, and Swelling.  Performing Technologist: Levada Schilling RDMS, RVT  Examination Guidelines: A complete evaluation includes B-mode imaging, spectral Doppler, color Doppler, and power Doppler as needed of all accessible portions of each vessel. Bilateral testing is considered an integral part of a complete examination. Limited examinations for reoccurring indications may be performed as noted. The reflux portion of the exam is performed with the patient in reverse Trendelenburg.  +---------+---------------+---------+-----------+----------+--------------+ RIGHT    CompressibilityPhasicitySpontaneityPropertiesThrombus Aging +---------+---------------+---------+-----------+----------+--------------+ CFV      Full           Yes      Yes                                 +---------+---------------+---------+-----------+----------+--------------+ SFJ      Full                                                        +---------+---------------+---------+-----------+----------+--------------+ FV Prox   Full                                                        +---------+---------------+---------+-----------+----------+--------------+ FV Mid   Full                                                        +---------+---------------+---------+-----------+----------+--------------+ FV DistalFull                                                        +---------+---------------+---------+-----------+----------+--------------+ PFV      Full                                                        +---------+---------------+---------+-----------+----------+--------------+  POP      Full           Yes      Yes                                 +---------+---------------+---------+-----------+----------+--------------+ PTV      Full                                                        +---------+---------------+---------+-----------+----------+--------------+ PERO     Full                                                        +---------+---------------+---------+-----------+----------+--------------+ GSV      Full                                                        +---------+---------------+---------+-----------+----------+--------------+   +---------+---------------+---------+-----------+----------+--------------+ LEFT     CompressibilityPhasicitySpontaneityPropertiesThrombus Aging +---------+---------------+---------+-----------+----------+--------------+ CFV      Full           Yes      Yes                                 +---------+---------------+---------+-----------+----------+--------------+ SFJ      Full                                                        +---------+---------------+---------+-----------+----------+--------------+ FV Prox  Full                                                        +---------+---------------+---------+-----------+----------+--------------+ FV Mid   Full                                                         +---------+---------------+---------+-----------+----------+--------------+ FV DistalFull                                                        +---------+---------------+---------+-----------+----------+--------------+ PFV      Full                                                        +---------+---------------+---------+-----------+----------+--------------+  POP      Full           Yes      Yes                                 +---------+---------------+---------+-----------+----------+--------------+ PTV      Full                                                        +---------+---------------+---------+-----------+----------+--------------+ PERO     Full                                                        +---------+---------------+---------+-----------+----------+--------------+ GSV      Full                                                        +---------+---------------+---------+-----------+----------+--------------+     Summary: BILATERAL: - No evidence of deep vein thrombosis seen in the lower extremities, bilaterally.  RIGHT: - No cystic structure found in the popliteal fossa.  LEFT: - No cystic structure found in the popliteal fossa.  *See table(s) above for measurements and observations. Electronically signed by Coral Else MD on 12/18/2019 at 9:41:53 PM.    Final     Labs:  Basic Metabolic Panel: Recent Labs  Lab 12/21/19 0506  NA 139  K 4.3  CL 102  CO2 26  GLUCOSE 92  BUN 16  CREATININE 0.89  CALCIUM 9.0    CBC: Recent Labs  Lab 12/21/19 0506  WBC 12.7*  NEUTROABS 8.3*  HGB 14.3  HCT 41.6  MCV 91.2  PLT 196    CBG: No results for input(s): GLUCAP in the last 168 hours.  Family history.  Mother with clotting disorder.  Denies any diabetes mellitus colon cancer or rectal cancer  Brief HPI:   JAXAN MICHEL is a 24 y.o. right-handed male with history of depression, well-documented high functioning  autism.  Lives with mother and 3 younger siblings.  Second floor apartment.  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  Presented 12/12/2019 with weakness, blurred vision and falls x3 months.  On 1 occasion he did strike his head without loss of consciousness.  Denies any bowel or bladder disturbances.  MRI showed patchy signal abnormality within the visualized distal cord consistent with demyelinating disease.  No abnormal enhancement to suggest active demyelination.  Tiny central disc protrusion at L5-S1 without stenosis or impingement.  Admission chemistries unremarkable except potassium 3.4, SARS coronavirus negative.  Lumbar puncture completed CSF no growth glucose 57 protein 33.  Neurology follow-up suspect multiple sclerosis placed on IV Solu-Medrol x5 days and plan follow-up outpatient neurology services Dr. Epimenio Foot.  Lovenox for DVT prophylaxis.  Tolerating a regular diet.  Patient was admitted for a comprehensive rehab program   Hospital Course: KALA GASSMANN was admitted to rehab 12/17/2019 for inpatient therapies to consist of PT, ST  and OT at least three hours five days a week. Past admission physiatrist, therapy team and rehab RN have worked together to provide customized collaborative inpatient rehab.  Pertaining to patient's newly diagnosed suspect multiple sclerosis completed course of IV Solu-Medrol he would follow-up outpatient Dr. Epimenio Foot.  He remained on Lovenox throughout his hospital stay for DVT prophylaxis venous Doppler studies negative.  Pain managed with use of Tylenol.  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  Bouts of tachycardia cardiology service was consulted bouts of orthostasis echocardiogram completed ejection fraction 65% without emboli he was placed on low-dose midodrine and monitored.  Mild initial leukocytosis felt to  be induced by Solu-Medrol but did improve to 11.6.  His orthostasis tachycardia felt to be related to autonomic dysfunction due to his multiple sclerosis and monitored.  Bouts of constipation resolved with laxative assistance.   Blood pressures were monitored on TID basis and soft and monitored  He/ is continent of bowel and bladder.  He/ has made gains during rehab stay and is attending therapies  He/ will continue to receive follow up therapies   after discharge  Rehab course: During patient's stay in rehab weekly team conferences were held to monitor patient's progress, set goals and discuss barriers to discharge. At admission, patient required moderate assist 20 feet rolling walker, minimal assist sit to stand.  Set up for eating and minimal guard upper body bathing moderate assist lower body bathing, minimal guard upper body dressing moderate assist lower body dressing  Physical exam.  Blood pressure 113/58 pulse 50 temperature 98 respirations 18 oxygen saturation 99% room air Neurological.  Alert sitting up in bed mood was a bit flat but appropriate follows commands he does answer simple yes/no questions provides his name and age limited medical historian General.  No acute distress mood and affect appropriate Neck.  Supple nontender no JVD without thyromegaly Head.  Normocephalic and atraumatic Eyes.  Pupils round and reactive to light no discharge Cardiac regular rate rhythm without any extra sounds or murmur heard Abdomen.  Soft nontender positive bowel sounds without rebound Respiratory effort normal respiratory distress without wheeze Extremities.  No evidence of breakdown no evidence of rash Neurological/musculoskeletal.  Cranial nerves II through XII intact motor strength 4 out of 5 bilateral deltoid bicep tricep grip hip flexors knee extensors ankle dorsi flexion plantarflexion.  Sensory exam normal sensation to light touch in bilateral upper and lower extremities.  Cerebellar  exam minimal dysmetria finger-to-nose as well as heel-to-shin in bilateral upper and lower extremities.  He/  has had improvement in activity tolerance, balance, postural control as well as ability to compensate for deficits. He/ has had improvement in functional use RUE/LUE  and RLE/LLE as well as improvement in awareness.  Patient alert attending full therapies abdominal binder when out of bed.  Ambulating 255 feet without loss of balance.  Needed some verbal cues to scan the environment.  Up-and-down stairs contact-guard assist supervision.  Ambulates up and down ramp supervision contact-guard.  Gather his belongings for activities day living and homemaking.  Full family teaching completed plan discharged home       Disposition: Discharged home    Diet: Regular  Special Instructions: No driving smoking or alcohol  Follow-up Dr. Epimenio Foot neurology services  Medications at discharge. 1.  Tylenol as needed 2.  ProAmatine 5 mg 3 times daily   Discharge Instructions    Ambulatory referral to Physical  Medicine Rehab   Complete by: As directed    Moderate complexity follow-up 1 to 2 weeks newly diagnosed multiple sclerosis      Follow-up Information    Lovorn, Aundra Millet, MD Follow up.   Specialty: Physical Medicine and Rehabilitation Why: As directed Contact information: [address] [place] [address] [phone]        Asa Lente, MD Follow up.   Specialty: Neurology Why: Call for appointment Contact information: [address] [place] [address] [phone]        Quintella Reichert, MD Follow up.   Specialty: Cardiology Why: call for appointment Contact information: [address] [place] [address] [phone]           Signed: Mcarthur Rossetti Yolanda Dockendorf 12/26/2019, 5:20 AM

## 2019-12-24 NOTE — Progress Notes (Signed)
Occupational Therapy Session Note  Patient Details  Name: Jaime [name] MRN: [id number] Date of Birth: 09/21/1996  Today's Date: 12/24/2019 OT Individual Time: 1300-1345 OT Individual Time Calculation (min): 45 min    Short Term Goals: Week 1:  OT Short Term Goal 1 (Week 1): STG = LTG  Skilled Therapeutic Interventions/Progress Updates:    Patient seated in recliner, denies pain or issues at this time.   Vitals:  Seated - BP 104/69,  HR 88 Standing - BP 102/60, HR 112 Completed seated and standing coordination activities with CGA.  Ambulation in room without AD CGA.  Completed bed making task with CGA.  dynavision in stance, 2 minutes, whole screen:  Trial 1:  Bilateral UEs  1.62 sec, Trial 2:  Right hand  1.60 sec, Trial 3:  Left hand 1.79 sec Returned to room, ambulated to recliner with CS, seat belt alarm set and call bell/tray table in reach.  BP 111/79, HR 85 Mother, Dawn, arrived for family education session after OT time slot completed - I spoke with her at end of day and reviewed level of care achieved for adl tasks and toileting, recommended use of shower seat for bathing and supervision for all activities in stance - she verbalized understanding.  Therapy Documentation Precautions:  Precautions Precautions: Fall Restrictions Weight Bearing Restrictions: No General:   Vital Signs: Therapy Vitals Temp: 98.9 F (37.2 C) Temp Source: Oral Pulse Rate: 93 Resp: 18 BP: 104/66 Patient Position (if appropriate): Sitting Oxygen Therapy SpO2: 98 % O2 Device: Room Air Pain: Pain Assessment Pain Scale: 0-10 Pain Score: 0-No pain   Therapy/Group: Individual Therapy  Barrie Lyme 12/24/2019, 2:42 PM

## 2019-12-24 NOTE — Progress Notes (Signed)
Progress Note  Patient Name: Jaime [name] Date of Encounter: 12/24/2019  Primary Cardiologist:   Fransico Him, MD   Subjective   No dizziness.  No Pain  Inpatient Medications    Scheduled Meds: . enoxaparin (LOVENOX) injection  40 mg Subcutaneous Q24H  . midodrine  5 mg Oral 3 times per day  . pantoprazole  40 mg Oral Daily   Continuous Infusions:  PRN Meds: acetaminophen **OR** acetaminophen, ondansetron **OR** ondansetron (ZOFRAN) IV, sorbitol   Vital Signs    Vitals:   12/23/19 1138 12/23/19 1453 12/23/19 1954 12/24/19 0536  BP: 93/68 (!) 89/55 103/69 99/65  Pulse: (!) 101 97 98 71  Resp:  18 18 16   Temp:   97.7 F (36.5 C) 98.2 F (36.8 C)  TempSrc:   Oral   SpO2:  97% 98% 98%    Intake/Output Summary (Last 24 hours) at 12/24/2019 1212 Last data filed at 12/24/2019 0920 Gross per 24 hour  Intake 240 ml  Output 1000 ml  Net -760 ml   There were no vitals filed for this visit.  Telemetry    NA - Personally Reviewed  ECG    NA - Personally Reviewed  Physical Exam   GEN: No acute distress.   Neck: No  JVD Cardiac: RRR, no murmurs, rubs, or gallops.  Respiratory: Clear  to auscultation bilaterally. GI: Soft, nontender, non-distended  MS: No edema; No deformity. Neuro:  Nonfocal  Psych: Normal affect   Labs    Chemistry Recent Labs  Lab 12/18/19 0409 12/21/19 0506  NA 139 139  K 3.9 4.3  CL 102 102  CO2 28 26  GLUCOSE 96 92  BUN 16 16  CREATININE 1.00 0.89  CALCIUM 8.7* 9.0  PROT 6.0*  --   ALBUMIN 3.3*  --   AST 17  --   ALT 18  --   ALKPHOS 33*  --   BILITOT 0.8  --   GFRNONAA >60 >60  GFRAA >60 >60  ANIONGAP 9 11     Hematology Recent Labs  Lab 12/18/19 0409 12/21/19 0506  WBC 13.4* 12.7*  RBC 4.73 4.56  HGB 15.0 14.3  HCT 43.8 41.6  MCV 92.6 91.2  MCH 31.7 31.4  MCHC 34.2 34.4  RDW 13.3 13.7  PLT 216 196    Cardiac EnzymesNo results for input(s): TROPONINI in the last 168 hours. No results for  input(s): TROPIPOC in the last 168 hours.   BNPNo results for input(s): BNP, PROBNP in the last 168 hours.   DDimer No results for input(s): DDIMER in the last 168 hours.   Radiology    No results found.  Cardiac Studies   ECHO:  1. Left ventricular ejection fraction, by estimation, is 60 to 65%. The  left ventricle has normal function. The left ventricle has no regional  wall motion abnormalities. Left ventricular diastolic parameters were  normal.  2. Right ventricular systolic function is normal. The right ventricular  size is normal. Tricuspid regurgitation signal is inadequate for assessing  PA pressure.  3. Moderate pericardial effusion. The pericardial effusion is anterior to  the right ventricle and posterior to the left ventricle. There is no  evidence of cardiac tamponade.  4. The mitral valve is grossly normal. No evidence of mitral valve  regurgitation. No evidence of mitral stenosis.  5. The aortic valve is tricuspid. Aortic valve regurgitation is not  visualized. No aortic stenosis is present.  6. The inferior vena cava is normal in  size with greater than 50%  respiratory variability, suggesting right atrial pressure of 3 mmHg.   Patient Profile   24 y.o. male with a hx of autism and depression who was admitted 3/9 with progressive weakness and falling. He was diagnosed with MS and started on IV steroids and discharged to CIR on 3/15. Since then PT has been working with him and has noticed that his HR will jump up to the 150-170's when PT is working with him. He also has had soft BPs with SBP in the 90's.   Assessment & Plan    POTS:  BP is OK to day.  He is sitting in a chair and has no dizziness at present.  Midodrine dose increased yesterday.  He and I talked about increased salt and staying hydrated.  He and I talked about avoiding sudden changes in position and being aware of orthostatic symptoms.  No change in therapy. We will arrange follow up in  clinic with Dr. Mayford Knife.    For questions or updates, please contact [facility] Please consult [email or web address] for contact info under Cardiology/STEMI.   Signed, Rollene Rotunda, MD  12/24/2019, 12:12 PM

## 2019-12-24 NOTE — Progress Notes (Signed)
Social Work Patient ID: Jaime [name], male   DOB: Jun 19, 1996, 24 y.o.   MRN: [id number]    SW spoke pt mother Jaime [name] [phone]) to discus family education. Reports she spoke with therapy team yesterday and will be in today at 1pm when she brings his shoes. SW discussed recommended DME: shower chair. SW indicated there will be follow-up with final recommendations.   SW ordered DME: shower chair with [employer]. Item to be delivered to room.    Cecile Sheerer, MSW, LCSWA Office: [phone] Cell: [phone] Fax: [phone]

## 2019-12-24 NOTE — Progress Notes (Signed)
Jaime [name] PHYSICAL MEDICINE & REHABILITATION PROGRESS NOTE   Subjective/Complaints:  Pt reports doing well this AM- just woke up- walked minimum 255 ft with CGA no AD yesterday- Did wake up around 3:50am per pt and was awake for 10-15 minutes, but went back to sleep.  Had Midodrine increased to 5 mg TID yesterday -doing better.      ROS:  Pt denies SOB, abd pain, CP, N/V/C/D, and vision changes    Objective:   No results found. No results for input(s): WBC, HGB, HCT, PLT in the last 72 hours. No results for input(s): NA, K, CL, CO2, GLUCOSE, BUN, CREATININE, CALCIUM in the last 72 hours.  Intake/Output Summary (Last 24 hours) at 12/24/2019 0845 Last data filed at 12/23/2019 2002 Gross per 24 hour  Intake -  Output 750 ml  Net -750 ml     Physical Exam: Vital Signs Blood pressure 99/65, pulse 71, temperature 98.2 F (36.8 C), resp. rate 16, SpO2 98 %.   General: just woke up; appropriate, NAD Psychiatric: appropriate, stilted slightly- chronic Heart:RRR Lungs: CTA B/L Abdomen: soft, NT, ND, (+)BS Extremities: No clubbing, cyanosis, or edema Skin: No evidence of breakdown, no evidence of rash Neurologic: Cranial nerves II through XII intact, motor strength is 4/5 in bilateral deltoid, bicep, tricep, grip, hip flexor, knee extensors, ankle dorsiflexor and plantar flexor HEENT: no nystagmus; EOMI B/L Cerebellar exam normal finger to nose to finger  in bilateral upper  Musculoskeletal: Full range of motion in all 4 extremities. No joint swelling     Assessment/Plan: 1. Functional deficits secondary to  Likely initial MS exacerbation which require 3+ hours per day of interdisciplinary therapy in a comprehensive inpatient rehab setting.  Physiatrist is providing close team supervision and 24 hour management of active medical problems listed below.  Physiatrist and rehab team continue to assess barriers to discharge/monitor patient progress toward functional and  medical goals  Care Tool:  Bathing    Body parts bathed by patient: Right arm, Left arm, Chest, Abdomen, Front perineal area, Buttocks, Right upper leg, Left upper leg, Right lower leg, Left lower leg, Face         Bathing assist Assist Level: Contact Guard/Touching assist     Upper Body Dressing/Undressing Upper body dressing   What is the patient wearing?: Pull over shirt    Upper body assist Assist Level: Set up assist    Lower Body Dressing/Undressing Lower body dressing      What is the patient wearing?: Underwear/pull up, Pants     Lower body assist Assist for lower body dressing: Contact Guard/Touching assist     Toileting Toileting    Toileting assist Assist for toileting: Contact Guard/Touching assist     Transfers Chair/bed transfer  Transfers assist     Chair/bed transfer assist level: Supervision/Verbal cueing     Locomotion Ambulation   Ambulation assist      Assist level: Contact Guard/Touching assist Assistive device: No Device Max distance: 255'   Walk 10 feet activity   Assist     Assist level: Contact Guard/Touching assist Assistive device: No Device   Walk 50 feet activity   Assist    Assist level: Contact Guard/Touching assist Assistive device: No Device    Walk 150 feet activity   Assist    Assist level: Contact Guard/Touching assist Assistive device: No Device    Walk 10 feet on uneven surface  activity   Assist     Assist level: Contact Guard/Touching assist Assistive  device: Hand held assist   Wheelchair     Assist Will patient use wheelchair at discharge?: No(Patient is a functional ambulator)   Wheelchair activity did not occur: N/A         Wheelchair 50 feet with 2 turns activity    Assist    Wheelchair 50 feet with 2 turns activity did not occur: N/A       Wheelchair 150 feet activity     Assist  Wheelchair 150 feet activity did not occur: N/A       Blood  pressure 99/65, pulse 71, temperature 98.2 F (36.8 C), resp. rate 16, SpO2 98 %.  Medical Problem List and Plan: 1.Decreased functional ability with diffuse weaknesssecondary to multiple sclerosis. IV Solu-Medrol completed. Plan follow-up outpatient Dr. Epimenio Foot. -patient may  shower -ELOS/Goals: 9-12d 2. Antithrombotics: -DVT/anticoagulation:Lovenox.Check vascular study 3/17- Dopplers (-) -antiplatelet therapy: N/A 3. Pain Management:Tylenol as needed 4. Mood:Provide emotional support -antipsychotic agents: N/A 5. Neuropsych: This patientiscapable of making decisions on hisown behalf. 6. Skin/Wound Care:Routine skin checks 7. Fluids/Electrolytes/Nutrition:Routine in and outs with follow-up chemistries 8. High-level functioning autism. Patient appears to be at baseline. Will discuss with mother and family. 9. Tachycardia     Cards consulting  pt's HR up to 160-170 with standing and BP low- had ECHO- Cards feel its autonomic dysfunction and needs TEDs, thigh high, Abd binder and Midodrine 2.5 mg TID- is ordered  3/22- increased midodrine to 5 mg TID 10. Leukocytosis  3/16- WBC down to 13.4 from 14.6- due to solumedrol- will monitor for getting worse or Sx's of illness.   3/18- will recheck in AM  3/19- down to 11.6- doing better- con't regimen 11. Hypotension/POTS dx  Autonomic dysfunction d/t MS- cardiology consulting , appreciate note from Dr Tenny Craw  3/22- increased midodrine last evening- doing well/better 13. Constipation  3/17- LBM 6 days ago- doesn't feel constipated- will give Sorbitol and Miralax- if doesn't work, will need Mg citrate.   LBM 3/20- no laxatives 14. Dispo  3/22- called mother to discuss pt's new medical issues- POTS disease, need for Midodrine, TEDs and abd binder. D/C Wednesday Spent 20 minutes on phone with mother today- total 30 minutes on pt today.       LOS: 7 days A FACE TO FACE  EVALUATION WAS PERFORMED  Hymen Arnett 12/24/2019, 8:45 AM

## 2019-12-25 ENCOUNTER — Telehealth: Payer: Self-pay | Admitting: Cardiology

## 2019-12-25 ENCOUNTER — Inpatient Hospital Stay (HOSPITAL_COMMUNITY): Payer: Medicaid Other | Admitting: Occupational Therapy

## 2019-12-25 ENCOUNTER — Inpatient Hospital Stay (HOSPITAL_COMMUNITY): Payer: Medicaid Other

## 2019-12-25 MED ORDER — MIDODRINE HCL 5 MG PO TABS: 5.0000 mg | ORAL_TABLET | Freq: Three times a day (TID) | ORAL | 0 refills | Status: DC

## 2019-12-25 MED ORDER — ACETAMINOPHEN 325 MG PO TABS: 650.0000 mg | ORAL_TABLET | Freq: Four times a day (QID) | ORAL | Status: DC | PRN

## 2019-12-25 MED ORDER — PANTOPRAZOLE SODIUM 40 MG PO TBEC: 40.0000 mg | DELAYED_RELEASE_TABLET | Freq: Every day | ORAL | 0 refills | Status: DC

## 2019-12-25 NOTE — Progress Notes (Signed)
Physical Therapy Discharge Summary  Patient Details  Name: Jaime [name] MRN: [id number] Date of Birth: 04/10/1996  Today's Date: 12/25/2019 PT Individual Time: 1430-1540 PT Individual Time Calculation (min): 70 min    Patient has met 6 of 12 long term goals due to improved activity tolerance, improved balance, improved postural control, increased strength, ability to compensate for deficits and improved coordination.  Patient to discharge at an ambulatory level Supervision.   Patient's care partner is independent to provide the necessary physical assistance at discharge.  Reasons goals not met: Patient with new diagnosis of POTS with continued episodes of symptomatic orthostatic hypotension and tachycardia requiring patient to have supervision-CGA with all standing mobility for safety. [name] participated in family education and is able to provided the necessary assistance the patient will need at home.   Recommendation:  Patient will benefit from ongoing skilled PT services in outpatient setting to continue to advance safe functional mobility, address ongoing impairments in balance, functional mobility, dizziness, dynamic gait, patient/caregiver education, and minimize fall risk.  Equipment: No equipment provided  Reasons for discharge: treatment goals met  Patient/family agrees with progress made and goals achieved: Yes  Skilled Therapeutic Intervention: Patient in recliner on the phone with his mother with B thigh high TED hose and abdominal binder donned upon PT arrival. Patient alert and agreeable to PT session. Patient denied pain during session. Spoke with patient's mother over the phone about handout provided to patient about POTS and the national MS society. Also educated on patient needing to wear thigh high TEDs and the abdominal binder at home and discussed donning and doffing with the patient with her listening on the phone. Patient able to teach back signs and  symptoms of variable vital signs and strategies to manage them, such as sitting or lying, from yesterdays session. [name] very appreciative of all education provided yesterday and over the phone today and that she is preparing the home for him based on home modifications to reduce fall risk suggested during yesterdays session.  Therapeutic Activity: Transfers: Patient performed sit to/from stands with supervision for safety throughout session. He also performed a toilet transfer and a simulated car transfer both with supervison for safety as well.   Gait Training:  Patient ambulated around the unit and 1019 feet during the Six Minute Walk Test (HR 121 RPE 0/10 before, HR 154 RPE 2/10 after) without and AD with close supervision for safety. Ambulated with wide BOS, decreased gait speed, increased step height, intermittent variable foot placement, slow turns, and LOB x1 due to scissoring gait, patient able to self correct without asssit. Patient recalled that increased gait speed reduced SLS time and decreased fall risk from previous sessions and ambulated with increased speed today. Provided verbal cues for consistent foot placement and scanning the environment. HR recovered to 110's and remained stable with sitting rest breaks in <1 min.  Patient reported that he has a SPC at home and that he would like to use it at d/c. Patient ambulated ~50 feet using a SPC following PT's demonstration. He had difficulty with coordination and sequencing with the [facility] and required CGA due to decreased balance and increased variable foot placement. PT educated on the additional challenge of adding the [facility] and recommended that the patient not use the cane at home. Patient stated that he would like to continue practicing with it, as he thinks he will get better. PT suggested he bring the cane to OPPT, but continue to ambulate without it  at home with someone with him for safety, patient stated understanding. When asked,  patient did report that he has increased fear of falling. Educated on continuing to improved his confidence with mobility by ambulating at home with family and short distances in the community and to continue to progress with OPPT. Patient appreciative of education.  Neuromuscular Re-ed: Patient performed the following dynamic balance assessments: Berg Balance Test (see details below): Patient demonstrates increased fall risk as noted by score of  49/56 on Berg Balance Scale.  (<36= high risk for falls, close to 100%; 37-45 significant >80%; 46-51 moderate >50%; 52-55 lower >25%) Functional Gait Assessment: Patient demonstrates increased fall risk with dynamic gait activities as noted by score of 20/30 on the FGA.  Performed on set of the following activities as part of HEP provided during session: -tandem stance 2x5 30 sec-1 min standing in a corner for safety with supervision (3x/day 7days/week) -SLS with clock reach 2x5 with single UE support and supervision for safety (3x/day 7days/week) -VOR x1 in standing 4x2 min or until symptoms 5-6/10 with supervision for safety (3x/day 7days/week)  Patient in recliner in room at end of session with breaks locked, seat belt alarm set, and all needs within reach.   Patient denied symptoms of dizziness and light headedness throughout session.   PT Discharge Precautions/Restrictions Precautions Precautions: Fall Precaution Comments: OH and elevated HR secondary to POTS (watch vitals and symptoms) Restrictions Weight Bearing Restrictions: No Pain Pain Assessment Pain Scale: 0-10 Pain Score: 0-No pain Vision/Perception  Vision - Assessment Eye Alignment: Within Functional Limits Ocular Range of Motion: Within Functional Limits Alignment/Gaze Preference: Within Defined Limits Tracking/Visual Pursuits: Decreased smoothness of horizontal tracking;Decreased smoothness of vertical tracking Saccades: Decreased speed of saccadic movement Convergence:  Within functional limits Additional Comments: ongoing VOR deficit Perception Perception: Within Functional Limits Praxis Praxis: Intact  Cognition Overall Cognitive Status: Within Functional Limits for tasks assessed Arousal/Alertness: Awake/alert Orientation Level: Oriented X4 Safety/Judgment: Appears intact Sensation Sensation Light Touch: Appears Intact Proprioception: Impaired by gross assessment Additional Comments: LE proprioceptive deficits improving Coordination Gross Motor Movements are Fluid and Coordinated: No Fine Motor Movements are Fluid and Coordinated: No Coordination and Movement Description: mild ataxia of B LE's R>[name] Heel Shin Test: mild dysmetria R>[name] Motor  Motor Motor: Ataxia Motor - Discharge Observations: Supervision required for safety with mobility due to [facility] and elevated HR  Mobility Bed Mobility Rolling Right: Independent Rolling Left: Independent Supine to Sit: Independent Sit to Supine: Independent Transfers Sit to Stand: Supervision/Verbal cueing Stand to Sit: Supervision/Verbal cueing Stand Pivot Transfers: Supervision/Verbal cueing Transfer (Assistive device): None Locomotion  Gait Ambulation: Yes Gait Assistance: Contact Guard/Touching assist Gait Distance (Feet): 1019 Feet Assistive device: None Gait Gait: Yes Gait Pattern: Ataxic;Decreased stride length;Wide base of support;Decreased trunk rotation Gait velocity: decreased Stairs / Additional Locomotion Stairs: Yes Stairs Assistance: Contact Guard/Touching assist Stair Management Technique: One rail Right Number of Stairs: 22 Height of Stairs: 6 Ramp: Contact Guard/touching assist Curb: Contact Guard/Touching assist Wheelchair Mobility Wheelchair Mobility: No(patient is a Engineer, petroleum)  Trunk/Postural Assessment  Cervical Assessment Cervical Assessment: Within Functional Limits Thoracic Assessment Thoracic Assessment: Within Functional Limits Lumbar  Assessment Lumbar Assessment: Within Functional Limits Postural Control Righting Reactions: decreased/delayed Protective Responses: decreased/delayed  Balance Balance Balance Assessed: Yes Standardized Balance Assessment Standardized Balance Assessment: Berg Balance Test;Functional Gait Assessment Static Sitting Balance Static Sitting - Balance Support: No upper extremity supported;Feet unsupported Static Sitting - Level of Assistance: 7: Independent Dynamic Sitting Balance Dynamic Sitting - Balance  Support: During functional activity;No upper extremity supported Dynamic Sitting - Level of Assistance: 7: Independent Dynamic Sitting - Balance Activities: Lateral lean/weight shifting;Forward lean/weight shifting;Reaching across midline Static Standing Balance Static Standing - Balance Support: No upper extremity supported Static Standing - Level of Assistance: 5: Stand by assistance Dynamic Standing Balance Dynamic Standing - Balance Support: No upper extremity supported;During functional activity Dynamic Standing - Level of Assistance: 5: Stand by assistance Dynamic Standing - Balance Activities: Lateral lean/weight shifting;Forward lean/weight shifting;Reaching for objects Berg Balance Test Sit to Stand: Able to stand without using hands and stabilize independently Standing Unsupported: Able to stand safely 2 minutes Sitting with Back Unsupported but Feet Supported on Floor or Stool: Able to sit safely and securely 2 minutes Stand to Sit: Sits safely with minimal use of hands Transfers: Able to transfer safely, minor use of hands Standing Unsupported with Eyes Closed: Able to stand 10 seconds safely Standing Ubsupported with Feet Together: Able to place feet together independently and stand 1 minute safely From Standing, Reach Forward with Outstretched Arm: Can reach forward >12 cm safely (5") From Standing Position, Pick up Object from Floor: Able to pick up shoe, needs  supervision From Standing Position, Turn to Look Behind Over each Shoulder: Looks behind from both sides and weight shifts well Turn 360 Degrees: Able to turn 360 degrees safely but slowly Standing Unsupported, Alternately Place Feet on Step/Stool: Able to stand independently and safely and complete 8 steps in 20 seconds Standing Unsupported, One Foot in Front: Able to plae foot ahead of the other independently and hold 30 seconds Standing on One Leg: Able to lift leg independently and hold equal to or more than 3 seconds Total Score: 49/56 Functional Gait  Assessment Gait Level Surface: Walks 20 ft in less than 7 sec but greater than 5.5 sec, uses assistive device, slower speed, mild gait deviations, or deviates 6-10 in outside of the 12 in walkway width. Change in Gait Speed: Able to smoothly change walking speed without loss of balance or gait deviation. Deviate no more than 6 in outside of the 12 in walkway width. Gait with Horizontal Head Turns: Performs head turns smoothly with slight change in gait velocity (eg, minor disruption to smooth gait path), deviates 6-10 in outside 12 in walkway width, or uses an assistive device. Gait with Vertical Head Turns: Performs task with slight change in gait velocity (eg, minor disruption to smooth gait path), deviates 6 - 10 in outside 12 in walkway width or uses assistive device Gait and Pivot Turn: Pivot turns safely in greater than 3 sec and stops with no loss of balance, or pivot turns safely within 3 sec and stops with mild imbalance, requires small steps to catch balance. Step Over Obstacle: Is able to step over one shoe box (4.5 in total height) without changing gait speed. No evidence of imbalance. Gait with Narrow Base of Support: Ambulates 4-7 steps. Gait with Eyes Closed: Walks 20 ft, uses assistive device, slower speed, mild gait deviations, deviates 6-10 in outside 12 in walkway width. Ambulates 20 ft in less than 9 sec but greater than 7  sec. Ambulating Backwards: Walks 20 ft, uses assistive device, slower speed, mild gait deviations, deviates 6-10 in outside 12 in walkway width. Steps: Alternating feet, must use rail. Total Score: 20/30 Extremity Assessment  RUE Assessment RUE Assessment: Within Functional Limits General Strength Comments: 4+ / 5 LUE Assessment LUE Assessment: Within Functional Limits General Strength Comments: 4+ / 5 RLE Assessment RLE Assessment:  Within Functional Limits Active Range of Motion (AROM) Comments: WFL for all functional mobility General Strength Comments: Grossly 5/5 in sitting LLE Assessment LLE Assessment: Within Functional Limits Active Range of Motion (AROM) Comments: WFL for all functional mobility General Strength Comments: Grossly 5/5 in sitting    Jaime [name] Keyona Emrich PT, DPT  12/25/2019, 3:40 PM

## 2019-12-25 NOTE — Progress Notes (Signed)
[place] PHYSICAL MEDICINE & REHABILITATION PROGRESS NOTE   Subjective/Complaints:  Pt reports no dizziness this AM, however had some dizziness mild, with PT and OT-yesterday during day- plans for d/c tomorrow.   ROS:  Pt denies SOB, abd pain, CP, N/V/C/D, and vision changes    Objective:   No results found. No results for input(s): WBC, HGB, HCT, PLT in the last 72 hours. No results for input(s): NA, K, CL, CO2, GLUCOSE, BUN, CREATININE, CALCIUM in the last 72 hours.  Intake/Output Summary (Last 24 hours) at 12/25/2019 0959 Last data filed at 12/25/2019 4010 Gross per 24 hour  Intake 840 ml  Output 950 ml  Net -110 ml     Physical Exam: Vital Signs Blood pressure (!) 104/53, pulse 66, temperature (!) 97.4 F (36.3 C), temperature source Oral, resp. rate 18, SpO2 100 %.   General: sitting up in bed; on phone texting, NAD Psychiatric: appropriate Heart: RRR, no JVD Lungs: CTA B/L- good air movement Abdomen: soft, NT, ND;  (+)BS Extremities: No clubbing, cyanosis, or edema Skin: No evidence of breakdown, no evidence of rash Neurologic:  motor strength is 5-/5 in all muscles checked- documented below- UEs and LEs B/L  in bilateral deltoid, bicep, tricep, grip, hip flexor, knee extensors, ankle dorsiflexor and plantar flexor HEENT: no nystagmus still; EOMI B/L Cerebellar exam normal finger to nose to finger  in bilateral upper  Musculoskeletal: Full range of motion in all 4 extremities. No joint swelling     Assessment/Plan: 1. Functional deficits secondary to  Likely initial MS exacerbation which require 3+ hours per day of interdisciplinary therapy in a comprehensive inpatient rehab setting.  Physiatrist is providing close team supervision and 24 hour management of active medical problems listed below.  Physiatrist and rehab team continue to assess barriers to discharge/monitor patient progress toward functional and medical goals  Care Tool:  Bathing     Body parts bathed by patient: Right arm, Left arm, Chest, Abdomen, Front perineal area, Buttocks, Right upper leg, Left upper leg, Right lower leg, Left lower leg, Face         Bathing assist Assist Level: Contact Guard/Touching assist     Upper Body Dressing/Undressing Upper body dressing   What is the patient wearing?: Pull over shirt    Upper body assist Assist Level: Set up assist    Lower Body Dressing/Undressing Lower body dressing      What is the patient wearing?: Underwear/pull up, Pants     Lower body assist Assist for lower body dressing: Contact Guard/Touching assist     Toileting Toileting    Toileting assist Assist for toileting: Contact Guard/Touching assist     Transfers Chair/bed transfer  Transfers assist     Chair/bed transfer assist level: Supervision/Verbal cueing     Locomotion Ambulation   Ambulation assist      Assist level: Contact Guard/Touching assist Assistive device: No Device Max distance: >500 ft   Walk 10 feet activity   Assist     Assist level: Contact Guard/Touching assist Assistive device: No Device   Walk 50 feet activity   Assist    Assist level: Contact Guard/Touching assist Assistive device: No Device    Walk 150 feet activity   Assist    Assist level: Contact Guard/Touching assist Assistive device: No Device    Walk 10 feet on uneven surface  activity   Assist     Assist level: Minimal Assistance - Patient > 75% Assistive device: Hand held assist  Wheelchair     Assist Will patient use wheelchair at discharge?: No(Patient is a functional ambulator)   Wheelchair activity did not occur: N/A         Wheelchair 50 feet with 2 turns activity    Assist    Wheelchair 50 feet with 2 turns activity did not occur: N/A       Wheelchair 150 feet activity     Assist  Wheelchair 150 feet activity did not occur: N/A       Blood pressure (!) 104/53, pulse 66,  temperature (!) 97.4 F (36.3 C), temperature source Oral, resp. rate 18, SpO2 100 %.  Medical Problem List and Plan: 1.Decreased functional ability with diffuse weaknesssecondary to multiple sclerosis. IV Solu-Medrol completed. Plan follow-up outpatient Dr. Epimenio Foot. -patient may  shower -ELOS/Goals: 9-12d 2. Antithrombotics: -DVT/anticoagulation:Lovenox.Check vascular study 3/17- Dopplers (-) 3/23- will not need to go home on lovenox since walking so well -antiplatelet therapy: N/A 3. Pain Management:Tylenol as needed 4. Mood:Provide emotional support -antipsychotic agents: N/A 5. Neuropsych: This patientiscapable of making decisions on hisown behalf. 6. Skin/Wound Care:Routine skin checks 7. Fluids/Electrolytes/Nutrition:Routine in and outs with follow-up chemistries 8. High-level functioning autism. Patient appears to be at baseline. Will discuss with mother and family. 9. Tachycardia/POTS   Cards consulting  pt's HR up to 160-170 with standing and BP low- had ECHO- Cards feel its autonomic dysfunction and needs TEDs, thigh high, Abd binder and Midodrine 2.5 mg TID- is ordered  3/22- increased midodrine to 5 mg TID  3/23- tolerating well- less dizziness 10. Leukocytosis  3/16- WBC down to 13.4 from 14.6- due to solumedrol- will monitor for getting worse or Sx's of illness.   3/18- will recheck in AM  3/19- down to 11.6- doing better- con't regimen 11. Hypotension/POTS dx  Autonomic dysfunction d/t MS- cardiology consulting , appreciate note from Dr Tenny Craw  3/22- increased midodrine last evening- doing well/better  3/23- tolerating increase in Midodrine well- doing better 13. Constipation  3/17- LBM 6 days ago- doesn't feel constipated- will give Sorbitol and Miralax- if doesn't work, will need Mg citrate.   LBM 3/20- no laxatives 14. Dispo  3/22- called mother to discuss pt's new medical issues- POTS disease, need  for Midodrine, TEDs and abd binder. D/C Wednesday Spent 20 minutes on phone with mother today- total 30 minutes on pt today.  3/23- will need f/u with Dr Berline Chough and Neurology-       LOS: 8 days A FACE TO FACE EVALUATION WAS PERFORMED  Niko Jakel 12/25/2019, 9:59 AM

## 2019-12-25 NOTE — Plan of Care (Signed)
  Problem: Consults Goal: RH GENERAL PATIENT EDUCATION Description: See Patient Education module for education specifics. Outcome: Progressing Goal: Skin Care Protocol Initiated - if Braden Score 18 or less Description: If consults are not indicated, leave blank or document N/A Outcome: Progressing Goal: Nutrition Consult-if indicated Outcome: Progressing   Problem: RH BOWEL ELIMINATION Goal: RH STG MANAGE BOWEL WITH ASSISTANCE Description: STG Manage Bowel with mod I  Outcome: Progressing Goal: RH STG MANAGE BOWEL W/MEDICATION W/ASSISTANCE Description: STG Manage Bowel with Medication with mod Assistance. Outcome: Progressing   Problem: RH BLADDER ELIMINATION Goal: RH STG MANAGE BLADDER WITH ASSISTANCE Description: STG Manage Bladder With mod I Assistance Outcome: Progressing Goal: RH STG MANAGE BLADDER WITH MEDICATION WITH ASSISTANCE Description: STG Manage Bladder With Medication With mod I Assistance. Outcome: Progressing   Problem: RH SKIN INTEGRITY Goal: RH STG SKIN FREE OF INFECTION/BREAKDOWN Outcome: Progressing Goal: RH STG MAINTAIN SKIN INTEGRITY WITH ASSISTANCE Description: STG Maintain Skin Integrity With mod I Assistance. Outcome: Progressing   Problem: RH SAFETY Goal: RH STG ADHERE TO SAFETY PRECAUTIONS W/ASSISTANCE/DEVICE Description: STG Adhere to Safety Precautions With mod I Assistance/Device. Outcome: Progressing Goal: RH STG DECREASED RISK OF FALL WITH ASSISTANCE Description: STG Decreased Risk of Fall With Assistance. Outcome: Progressing   Problem: RH PAIN MANAGEMENT Goal: RH STG PAIN MANAGED AT OR BELOW PT'S PAIN GOAL Description: Less than 3 out of 10 Outcome: Progressing   Problem: RH KNOWLEDGE DEFICIT GENERAL Goal: RH STG INCREASE KNOWLEDGE OF SELF CARE AFTER HOSPITALIZATION Outcome: Progressing   Problem: RH Vision Goal: RH LTG Vision (Specify) Outcome: Progressing

## 2019-12-25 NOTE — Telephone Encounter (Signed)
Spoke to [employer] Rep monitor was enrolled by a member of hospital staff. She is going to look into and get back to me

## 2019-12-25 NOTE — Telephone Encounter (Signed)
   Jaime [name] from [place] called and she said, pt's zio AT is not activated, they tried calling the pt but the pt hasn't call them back.

## 2019-12-25 NOTE — Progress Notes (Signed)
Occupational Therapy Discharge Summary  Patient Details  Name: Jaime [name] MRN: [id number] Date of Birth: [birth year]  Today's Date: 12/25/2019 OT Individual Time: 0925-1030 OT Individual Time Calculation (min): 65 min    Patient in bed, alert and cooperative.   He denies pain.  He is able to donn thigh high teds seated at edge of bed with min A and complete LB dressing CS.  He obtained OH shirt from duffle bag and donns independently.  Reviewed s/s of low BP, increased HR and strategies to reduce fall risk.  He is able to recall information provided on prior days and demonstrates a good understanding.  Ambulation in room and on unit with CS, he completes toileting with CS.  Grooming tasks in stance with CS, when seated he is independent.  assessment for discharge completed as documented below.  He presents with ongoing motor impairment but improved within this past week.  Mother present yesterday for family education and demonstrates understanding of supervision recommendations.  Patient has met 7 of 10 long term goals due to improved activity tolerance, improved balance and improved coordination.  Patient to discharge at overall Supervision level.  Patient's care partner is independent to provide the necessary physical assistance at discharge.    Reasons goals not met: recommend CS for all activity in stance due to POTS and fall risk   Recommendation:  Patient will benefit from ongoing skilled OT services in outpatient setting to continue to advance functional skills in the area of BADL and iADL.  Equipment: shower seat with back  Reasons for discharge: treatment goals met  Patient/family agrees with progress made and goals achieved: Yes  OT Discharge Precautions/Restrictions  Precautions Precautions: Fall Precaution Comments: OH and elevated HR secondary to POTS (watch vitals and symptoms) Restrictions Weight Bearing Restrictions: No Other Position/Activity Restrictions:  abdominal binder and thigh high teds General   Vital Signs  Pain Pain Assessment Pain Scale: 0-10 Pain Score: 0-No pain ADL ADL Eating: Independent Where Assessed-Eating: Chair Grooming: Independent Where Assessed-Grooming: Sitting at sink, Other (comment)(supervision in stance) Upper Body Bathing: Modified independent Where Assessed-Upper Body Bathing: Shower Lower Body Bathing: Supervision/safety Where Assessed-Lower Body Bathing: Shower Upper Body Dressing: Independent Where Assessed-Upper Body Dressing: Chair Lower Body Dressing: Modified independent Where Assessed-Lower Body Dressing: Chair Toileting: Supervision/safety Where Assessed-Toileting: Glass blower/designer: Close supervision Armed forces technical officer Method: Counselling psychologist: Energy manager: Close supervision Social research officer, government Method: Heritage manager: Civil engineer, contracting with back Vision Baseline Vision/History: Wears glasses Wears Glasses: At all times Patient Visual Report: Other (comment)(patient reports no issues with reading/video games, no blurred vision) Vision Assessment?: Yes Eye Alignment: Within Functional Limits Ocular Range of Motion: Within Functional Limits Alignment/Gaze Preference: Within Defined Limits Tracking/Visual Pursuits: Decreased smoothness of vertical tracking;Decreased smoothness of horizontal tracking Saccades: Decreased speed of saccadic movement Convergence: Within functional limits Visual Fields: No apparent deficits Additional Comments: ongoing VOR deficit Perception  Perception: Within Functional Limits Praxis Praxis: Intact Cognition Overall Cognitive Status: Within Functional Limits for tasks assessed Arousal/Alertness: Awake/alert Orientation Level: Oriented X4 Safety/Judgment: Appears intact Sensation Sensation Light Touch: Appears Intact Coordination Finger Nose Finger Test: mild dysmetria R>L 9 Hole Peg  Test: R = 52 sec, L = 41 sec,    Box and blocks:   R = 30, L = 33 Motor  Motor Motor: Ataxia Motor - Discharge Observations: Supervision required for safety with mobility due to [facility] and elevated HR Mobility  Bed Mobility Rolling Right: Independent Rolling Left:  Independent Supine to Sit: Independent Sit to Supine: Independent Transfers Sit to Stand: Supervision/Verbal cueing Stand to Sit: Supervision/Verbal cueing  Trunk/Postural Assessment  Cervical Assessment Cervical Assessment: Within Functional Limits Thoracic Assessment Thoracic Assessment: Within Functional Limits Lumbar Assessment Lumbar Assessment: Within Functional Limits Postural Control Righting Reactions: decreased/delayed Protective Responses: decreased/delayed  Balance Static Sitting Balance Static Sitting - Level of Assistance: 7: Independent Dynamic Sitting Balance Dynamic Sitting - Level of Assistance: 7: Independent Static Standing Balance Static Standing - Level of Assistance: 6: Modified independent (Device/Increase time) Dynamic Standing Balance Dynamic Standing - Level of Assistance: 5: Stand by assistance Extremity/Trunk Assessment RUE Assessment RUE Assessment: Within Functional Limits General Strength Comments: 4+ / 5 LUE Assessment LUE Assessment: Within Functional Limits General Strength Comments: 4+ / 5   Wyonia Fontanella A Debrah Granderson 12/25/2019, 12:54 PM

## 2019-12-25 NOTE — Patient Care Conference (Signed)
Inpatient RehabilitationTeam Conference and Plan of Care Update Date: 12/25/2019   Time: 11:40 AM   Patient Name: Jaime [name]      Medical Record Number: [id number]  Date of Birth: [birth year] Sex: Male         Room/Bed: 4M11C/4M11C-01 Payor Info: Payor: MEDICAID [place] / Plan: MEDICAID [place] ACCESS / Product Type: *No Product type* /    Admit Date/Time:  12/17/2019  4:42 PM  Primary Diagnosis:  MS (multiple sclerosis) ([place])  Patient Active Problem List   Diagnosis Date Noted  . Autism spectrum disorder   . Tachycardia   . 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 Multiple sclerosis exacerbation ([place]) 12/17/2019  . Lower extremity weakness 12/12/2019  . Demyelinating disease of central nervous system ([place]) 12/12/2019  . MS (multiple sclerosis) ([place]) 12/12/2019    Expected Discharge Date: Expected Discharge Date: 12/26/19  Team Members Present: Physician leading conference: Dr. Courtney Heys Care Coodinator Present: Karene Fry, RN, MSN;Auria Barbra Sarks, [place] Nurse Present: Dwaine Gale, RN PT Present: Apolinar Junes, PT OT Present: Elisabeth Most, OT SLP Present: Stormy Fabian, SLP PPS Coordinator present : Gunnar Fusi, Novella Olive, PT     Current Status/Progress Goal Weekly Team Focus  Bowel/Bladder   Continent of B/B, lbm 3/22  Remain continent of b/b  Assess qshift and prn   Swallow/Nutrition/ Hydration             ADL's   CS/CG  CS  adl training, transfer training, family educ, balance and coordination, BP/HR monitoring   Mobility   Supervision-CGA overall due to [facility] and elevated HR with symptoms during mobility, gait >500 ft, 22 steps 1 rail  Mod I household mobility, CGA-supervision community mobility  D/c planning, balance, functional mobility, patient/caregiver education   Communication             Safety/Cognition/ Behavioral Observations            Pain   Denies any current pain  Remain pain free  Assess pain qshift and prn    Skin   Old skin tear to left elbow (scabbed)  No new skin impairments  Assess skin qshift and prn    Rehab Goals Patient on target to meet rehab goals: Yes *See Care Plan and progress notes for long and short-term goals.     Barriers to Discharge  Current Status/Progress Possible Resolutions Date Resolved   Nursing                  PT     No barriers at this time, patient's mother participated in family education this week and is able to provide the appropriate assistance at home              OT                  SLP                SW                Discharge Planning/Teaching Needs:  Pt will d/c to home with 24/7 care from his mother  Family education as recommended by therapy.   Team Discussion: MD strength improved, talked with mom yesterday.  RN cont B/B, dizzyness.  OT inc bladder today, close S self care.  PT fam ed with mom yesterday, needs S/CGA all mobility, has sister, neighbor and mom to help, amb 500', up/down 2 flights of stairs, HR up to 110-120 with activity.  Revisions to Treatment Plan: N/A     Medical Summary Current Status: continent; B/B; no dizziness complaints; no wounds Weekly Focus/Goal: OT- incontinent bladder this AM- didn't call; close SBA for self care  Barriers to Discharge: Incontinence;Neurogenic Bowel & Bladder;Behavior;Decreased family/caregiver support;Home enviroment access/layout  Barriers to Discharge Comments: mother aware needs supervison at home due to Autonmic dysfunction- d/c tomorrow Possible Resolutions to Barriers: PT- SBA for goal due to orthostatic hypotension- walked 51ft and 2 flights yesterday- HR ~ 110s with activity- jumps when first gets up   Continued Need for Acute Rehabilitation Level of Care: The patient requires daily medical management by a physician with specialized training in physical medicine and rehabilitation for the following reasons: Direction of a multidisciplinary physical rehabilitation program to  maximize functional independence : Yes Medical management of patient stability for increased activity during participation in an intensive rehabilitation regime.: Yes Analysis of laboratory values and/or radiology reports with any subsequent need for medication adjustment and/or medical intervention. : Yes   I attest that I was present, lead the team conference, and concur with the assessment and plan of the team.   Trish Mage 12/25/2019, 8:13 PM   Team conference was held via web/ teleconference due to COVID - 19

## 2019-12-25 NOTE — Progress Notes (Signed)
Occupational Therapy Session Note  Patient Details  Name: Jaime [name] MRN: [id number] Date of Birth: Jan 25, 1996  Today's Date: 12/25/2019 OT Individual Time: 1101-1143 OT Individual Time Calculation (min): 42 min    Short Term Goals: Week 1:  OT Short Term Goal 1 (Week 1): STG = LTG  Skilled Therapeutic Interventions/Progress Updates:    Upon entering the room, pt seated in recliner chair with no c/o pain and agreeable to OT intervention. Pt donning abdominal binder with assistance to fasten tightly. Pt ambulating in room and 100' to ADL apartment without use of AD at close supervision level. Pt continues to have ataxic gait but no significant LOB this session. Pt taking seated rest break in recliner chair , similar to home, and able to stand from independently. Pt verbalized bathroom set up and stepped over tub ledge with use of grab bars as he has these at home as well. OT discussed energy conservation at home and discharge recommendations with pt verbalizing understanding and being an active participant in discussion.   Therapy Documentation Precautions:  Precautions Precautions: Fall Precaution Comments: OH and elevated HR secondary to POTS (watch vitals and symptoms) Restrictions Weight Bearing Restrictions: No   Pain: Pain Assessment Pain Scale: 0-10 Pain Score: 0-No pain ADL: ADL Eating: Independent Where Assessed-Eating: Chair Grooming: Supervision/safety, Contact guard Where Assessed-Grooming: Standing at sink Upper Body Bathing: Supervision/safety Where Assessed-Upper Body Bathing: Shower Lower Body Bathing: Minimal assistance Where Assessed-Lower Body Bathing: Shower Upper Body Dressing: Minimal assistance Where Assessed-Upper Body Dressing: Chair Lower Body Dressing: Minimal assistance Where Assessed-Lower Body Dressing: Chair Toileting: Minimal assistance Where Assessed-Toileting: Teacher, adult education: Curator Method:  Proofreader: Acupuncturist: Insurance underwriter Method: Designer, industrial/product: Grab bars, Transfer tub bench   Therapy/Group: Individual Therapy  Alen Bleacher 12/25/2019, 12:46 PM

## 2019-12-26 NOTE — Plan of Care (Signed)
  Problem: Consults Goal: RH GENERAL PATIENT EDUCATION Description: See Patient Education module for education specifics. Outcome: Completed/Met Goal: Skin Care Protocol Initiated - if Braden Score 18 or less Description: If consults are not indicated, leave blank or document N/A Outcome: Completed/Met Goal: Nutrition Consult-if indicated Outcome: Completed/Met   Problem: RH BOWEL ELIMINATION Goal: RH STG MANAGE BOWEL WITH ASSISTANCE Description: STG Manage Bowel with mod I  Outcome: Completed/Met Goal: RH STG MANAGE BOWEL W/MEDICATION W/ASSISTANCE Description: STG Manage Bowel with Medication with mod Assistance. Outcome: Completed/Met   Problem: RH BLADDER ELIMINATION Goal: RH STG MANAGE BLADDER WITH ASSISTANCE Description: STG Manage Bladder With mod I Assistance Outcome: Completed/Met Goal: RH STG MANAGE BLADDER WITH MEDICATION WITH ASSISTANCE Description: STG Manage Bladder With Medication With mod I Assistance. Outcome: Completed/Met   Problem: RH SKIN INTEGRITY Goal: RH STG SKIN FREE OF INFECTION/BREAKDOWN Outcome: Completed/Met Goal: RH STG MAINTAIN SKIN INTEGRITY WITH ASSISTANCE Description: STG Maintain Skin Integrity With mod I Assistance. Outcome: Completed/Met   Problem: RH SAFETY Goal: RH STG ADHERE TO SAFETY PRECAUTIONS W/ASSISTANCE/DEVICE Description: STG Adhere to Safety Precautions With mod I Assistance/Device. Outcome: Completed/Met Goal: RH STG DECREASED RISK OF FALL WITH ASSISTANCE Description: STG Decreased Risk of Fall With Assistance. Outcome: Completed/Met   Problem: RH PAIN MANAGEMENT Goal: RH STG PAIN MANAGED AT OR BELOW PT'S PAIN GOAL Description: Less than 3 out of 10 Outcome: Completed/Met   Problem: RH KNOWLEDGE DEFICIT GENERAL Goal: RH STG INCREASE KNOWLEDGE OF SELF CARE AFTER HOSPITALIZATION Outcome: Completed/Met

## 2019-12-26 NOTE — Progress Notes (Signed)
Social Work Patient ID: Jaime [name], male   DOB: December 18, 1995, 24 y.o.   MRN: [id number]   SW spoke with pt mother to discuss d/c recs Outpatient PT/OT. Pt mother has no preference. SW informed will send referral to [facility]. Pt mother asked questions about a heart monitor for pt. SW indicated being unaware, however, will follow-up with medical team to get insight and ask that they follow-up. Pt mother reports she will be here around 11:30am for discharge.   SW met with pt in room to discuss above. SW confirms shower chair delivered to pt room.   SW faxed OPPT/OT referral to [facility] ([phone]/f:[phone]). *Confirms referral received and will follow-up with family.   Loralee Pacas, MSW, [place] Office: [phone] Cell: [phone] Fax: [phone]

## 2019-12-26 NOTE — Progress Notes (Signed)
Patient and his mom received discharged instructions before they left hospital.

## 2019-12-26 NOTE — Telephone Encounter (Signed)
Monitor order was cancelled per ordering NP

## 2019-12-26 NOTE — Progress Notes (Signed)
   Initial plan was to obtain a zio patch monitor on initial consultation. Subsequently diagnosed with POTS. Do not feel a zio patch is necessary at this time. Will cancel the order.   Beatriz Stallion, PA-C 12/26/19; 1:53 PM

## 2019-12-26 NOTE — Discharge Instructions (Signed)
Inpatient Rehab Discharge Instructions  MEGAN HAYDUK Discharge date and time: No discharge date for patient encounter.   Activities/Precautions/ Functional Status: Activity: activity as tolerated Diet: regular diet Wound Care: none needed Functional status:  ___ No restrictions     ___ Walk up steps independently ___ 24/7 supervision/assistance   ___ Walk up steps with assistance ___ Intermittent supervision/assistance  ___ Bathe/dress independently ___ Walk with walker     _x__ Bathe/dress with assistance ___ Walk Independently    ___ Shower independently ___ Walk with assistance    ___ Shower with assistance ___ No alcohol     ___ Return to work/school ________   COMMUNITY REFERRALS UPON DISCHARGE:     Outpatient: PT    OT             Agency: [facility] Phone: [phone]             Appointment Date/Time: *Please expect follow-up within 3-5 business days after discharge to schedule your appointment. If you have not received follow-up be sure to contact the agency.*  Medical Equipment/Items Ordered: shower chair                                                 Agency/Supplier: [facility] [phone]    Special Instructions: No driving smoking or alcohol   My questions have been answered and I understand these instructions. I will adhere to these goals and the provided educational materials after my discharge from the hospital.  Patient/Caregiver Signature _______________________________ Date __________  Clinician Signature _______________________________________ Date __________  Please bring this form and your medication list with you to all your follow-up doctor's appointments.

## 2019-12-27 NOTE — Progress Notes (Signed)
Social Work Discharge Note   The overall goal for the admission was met for:   Discharge location: Yes. D/c to home with his mother.   Length of Stay: Yes. 9 days.  Discharge activity level: Yes. 24/7 care due to cognition  Home/community participation: Yes. Limited  Services provided included: MD, RD, PT, OT, SLP, RN, CM, TR, Pharmacy, Neuropsych and SW  Financial Services: Medicaid  Follow-up services arranged: Outpatient: [facility] for PT/OT and DME: [facility]; shower chair  Comments (or additional information): contact pt mother [phone]  Patient/Family verbalized understanding of follow-up arrangements: Yes  Individual responsible for coordination of the follow-up plan: Pt mother to assist with coordinating care needs.   Confirmed correct DME delivered: Rana Snare 12/27/2019    Rana Snare

## 2019-12-28 ENCOUNTER — Ambulatory Visit: Payer: Self-pay | Admitting: Nurse Practitioner

## 2019-12-28 ENCOUNTER — Telehealth: Payer: Self-pay

## 2019-12-28 NOTE — Telephone Encounter (Signed)
Called Dawn-patients mother twice and no answer, no voicemail to leave a message.

## 2020-01-09 ENCOUNTER — Encounter
Payer: Medicaid Other | Attending: Physical Medicine and Rehabilitation | Admitting: Physical Medicine and Rehabilitation

## 2020-01-09 ENCOUNTER — Other Ambulatory Visit: Payer: Self-pay

## 2020-01-09 ENCOUNTER — Encounter: Payer: Self-pay | Admitting: Physical Medicine and Rehabilitation

## 2020-01-09 VITALS — BP 96/63 | HR 105 | Ht 68.0 in | Wt 133.6 lb

## 2020-01-09 DIAGNOSIS — F840 Autistic disorder: Secondary | ICD-10-CM | POA: Diagnosis present

## 2020-01-09 DIAGNOSIS — R2689 Other abnormalities of gait and mobility: Secondary | ICD-10-CM | POA: Insufficient documentation

## 2020-01-09 DIAGNOSIS — I951 Orthostatic hypotension: Secondary | ICD-10-CM

## 2020-01-09 DIAGNOSIS — G35 Multiple sclerosis: Secondary | ICD-10-CM | POA: Diagnosis present

## 2020-01-09 DIAGNOSIS — G903 Multi-system degeneration of the autonomic nervous system: Secondary | ICD-10-CM | POA: Diagnosis not present

## 2020-01-09 MED ORDER — MIDODRINE HCL 5 MG PO TABS: 5.0000 mg | ORAL_TABLET | Freq: Three times a day (TID) | ORAL | 11 refills | Status: DC

## 2020-01-09 NOTE — Patient Instructions (Signed)
Patient is 24 yr old with probable MS s/p 1st exacerbation- with improving symptoms, but balance still an issue; nystagmus resolved.   1. Discussed needing to walk outside more- so doesn't get deconditioned.   2. If numbness gets worse in R hand, suggest over the counter- wrist brace to to keep his wrist in neutral- most people wear mainly at night.   3. Don't think Numbness of R hand is due to probable MS.   4. Exercise ball - start with just sitting on the ball initially; then start to do balance activities on ball and home exercise program. Use near corner to have a back up.    5. From there, start walking on uneven ground.  6. Continue Midodrine 3x/day with meals- helps keep his BP up and not too tachycardic- fast heart rate. Refilled Midodrine.   7. 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  It can occur in MS, DM, SCI.   8. Can get COVID.   9. If you don't here from therapy within 1 more week, call me at [phone]- and I will order myself. However if car issue, work on therapy himself- at home until can go to outpatient- H/H is lower level than [place] can do.   10. F/U in 3 months

## 2020-01-09 NOTE — Progress Notes (Signed)
Subjective:    Patient ID: Jaime [name], male    DOB: 04-13-1996, 24 y.o.   MRN: [id number]  HPI   Patient is a 24 yr old male with new dx of MS- with generalized weakness here for hospital f/u  Walking is going well.  No falls since got out of hospital.   Sat outside for awhile /1 hour the other day.  Hasn't been walking outside so far, since got home.  Walks up and down stairs daily- no problems with that.    Cleaned out his room yesterday- found some clothes to wear and to get rid of. Can now walk around in room and not trip anymore.    No more dizziness, no more blurry vision- since was in hospital.  Used to have issues with bladder accidents- none since got home.  Mother and sisters help him to bathroom, etc.   Having new numbness/tingling of R hand. Can't feel it like L hand- not enough to hinder activity.  Not painful.    Denies Muscle spasms.   Hasn't started PT or OT- supposed to be outpatient.    Social Hx: Mother reports she's hoarder  Mother's car acting up   Pain Inventory Average Pain 0 Pain Right Now 0 My pain is no pain  In the last 24 hours, has pain interfered with the following? General activity 0 Relation with others 0 Enjoyment of life 0 What TIME of day is your pain at its worst? no pain Sleep (in general) Fair  Pain is worse with: no pain Pain improves with: no pain Relief from Meds: no pain  Mobility walk without assistance walk with assistance ability to climb steps?  yes do you drive?  no needs help with transfers  Function not employed: date last employed . I need assistance with the following:  dressing, bathing, meal prep, household duties and shopping  Neuro/Psych numbness trouble walking dizziness  Prior Studies Any changes since last visit?  no  Physicians involved in your care Any changes since last visit?  no   Family History  Problem Relation Age of Onset  . Clotting disorder Mother    Social  History   Socioeconomic History  . Marital status: Single    Spouse name: Not on file  . Number of children: Not on file  . Years of education: Not on file  . Highest education level: Not on file  Occupational History  . Not on file  Tobacco Use  . Smoking status: Never Smoker  . Smokeless tobacco: Never Used  Substance and Sexual Activity  . Alcohol use: No  . Drug use: Never  . Sexual activity: Not Currently  Other Topics Concern  . Not on file  Social History Narrative  . Not on file   Social Determinants of Health   Financial Resource Strain:   . Difficulty of Paying Living Expenses:   Food Insecurity:   . Worried About Charity fundraiser in the Last Year:   . Arboriculturist in the Last Year:   Transportation Needs:   . Film/video editor (Medical):   Marland Kitchen Lack of Transportation (Non-Medical):   Physical Activity:   . Days of Exercise per Week:   . Minutes of Exercise per Session:   Stress:   . Feeling of Stress :   Social Connections:   . Frequency of Communication with Friends and Family:   . Frequency of Social Gatherings with Friends and Family:   .  Attends Religious Services:   . Active Member of Clubs or Organizations:   . Attends Banker Meetings:   Marland Kitchen Marital Status:    No past surgical history on file. Past Medical History:  Diagnosis Date  . Autism   . Depression    BP 96/63   Pulse (!) 105   Ht 5\' 8"  (1.727 m) Comment: reported by pt  Wt 133 lb 9.6 oz (60.6 kg)   SpO2 97%   BMI 20.31 kg/m   Opioid Risk Score:   Fall Risk Score:  `1  Depression screen PHQ 2/9  Depression screen PHQ 2/9 01/09/2020  Decreased Interest 0  Down, Depressed, Hopeless 0  PHQ - 2 Score 0  Altered sleeping 0  Tired, decreased energy 0  Change in appetite 0  Feeling bad or failure about yourself  0  Trouble concentrating 0  Moving slowly or fidgety/restless 0  Suicidal thoughts 0  PHQ-9 Score 0    Review of Systems  Constitutional:  Negative.        Appetite improved  HENT: Negative.   Eyes: Negative.   Respiratory: Negative.   Cardiovascular: Negative.   Gastrointestinal: Positive for nausea.  Endocrine: Negative.   Genitourinary: Negative.   Musculoskeletal: Positive for gait problem.  Skin: Negative.   Allergic/Immunologic: Negative.   Neurological: Positive for dizziness and numbness.  Hematological: Negative.   Psychiatric/Behavioral: Negative.   All other systems reviewed and are negative.      Objective:   Physical Exam  Awake, alert, appropriate, accompanied by mother, NAD MS: 5/5 in UEs and LEs Tested deltoids, biceps, triceps, WE, grip and finger abd And HF, KE, KF, Df and PF- all 5/5 B/L  Neuro: Decreased sensation in /less sensation in R 5th digit than 1s/t3rd digits- Tinel's (+) at R wrist not at R elbow  DTRs 3+ to 4+ in knees B/L : 2+ in biceps B/L No hoffman's; no Clonus B/L EOMI B/L- no nystagmus Balance impaired with eyes closed >eyes open- swaying considerably with eyes closed, but better.  Can keep balance with feet together      Assessment & Plan:   Patient is 24 yr old with probable MS s/p 1st exacerbation- with improving symptoms, but balance still an issue; nystagmus resolved.   1. Discussed needing to walk outside more- so doesn't get deconditioned.   2. If numbness gets worse in R hand, suggest over the counter- wrist brace to to keep his wrist in neutral- most people wear mainly at night.   3. Don't think Numbness of R hand is due to probable MS.   4. Exercise ball - start with just sitting on the ball initially; then start to do balance activities on ball and home exercise program. Use near corner to have a back up.    5. From there, start walking on uneven ground.  6. Continue Midodrine 3x/day with meals- helps keep his BP up and not too tachycardic- fast heart rate. Refilled Midodrine.   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  It can occur in MS, DM, SCI.   8. Can get COVID.   9. If you don't here from therapy within 1 more week, call me at [phone]- and I will order myself. However if car issue, work on therapy himself- at home until can go to outpatient- H/H is lower level than [place] can do.   10. F/U in 3 months  I spent a  total of 30 minutes on appointment- more than 20 minutes discussing function as detailed above.

## 2020-04-16 ENCOUNTER — Ambulatory Visit: Payer: Medicaid Other | Admitting: Physical Medicine and Rehabilitation

## 2020-04-28 ENCOUNTER — Encounter: Payer: Medicaid Other | Admitting: Physical Medicine and Rehabilitation

## 2020-04-28 ENCOUNTER — Other Ambulatory Visit: Payer: Self-pay

## 2020-05-19 ENCOUNTER — Emergency Department (HOSPITAL_COMMUNITY): Payer: Medicaid Other

## 2020-05-19 ENCOUNTER — Encounter: Payer: Medicaid Other | Admitting: Physical Medicine and Rehabilitation

## 2020-05-19 ENCOUNTER — Inpatient Hospital Stay (HOSPITAL_COMMUNITY)
Admission: EM | Admit: 2020-05-19 | Discharge: 2020-05-24 | DRG: 059 | Disposition: A | Discharge status: Rehabilitation Facility | Payer: Medicaid Other | Attending: Internal Medicine | Admitting: Internal Medicine

## 2020-05-19 ENCOUNTER — Other Ambulatory Visit: Payer: Self-pay

## 2020-05-19 ENCOUNTER — Encounter (HOSPITAL_COMMUNITY): Payer: Self-pay

## 2020-05-19 DIAGNOSIS — X58XXXA Exposure to other specified factors, initial encounter: Secondary | ICD-10-CM | POA: Diagnosis present

## 2020-05-19 DIAGNOSIS — R29810 Facial weakness: Secondary | ICD-10-CM | POA: Diagnosis present

## 2020-05-19 DIAGNOSIS — I498 Other specified cardiac arrhythmias: Secondary | ICD-10-CM | POA: Diagnosis present

## 2020-05-19 DIAGNOSIS — R32 Unspecified urinary incontinence: Secondary | ICD-10-CM | POA: Diagnosis present

## 2020-05-19 DIAGNOSIS — F329 Major depressive disorder, single episode, unspecified: Secondary | ICD-10-CM | POA: Diagnosis present

## 2020-05-19 DIAGNOSIS — D72829 Elevated white blood cell count, unspecified: Secondary | ICD-10-CM | POA: Diagnosis not present

## 2020-05-19 DIAGNOSIS — R2689 Other abnormalities of gait and mobility: Secondary | ICD-10-CM | POA: Diagnosis present

## 2020-05-19 DIAGNOSIS — J90 Pleural effusion, not elsewhere classified: Secondary | ICD-10-CM | POA: Diagnosis present

## 2020-05-19 DIAGNOSIS — F32A Depression, unspecified: Secondary | ICD-10-CM

## 2020-05-19 DIAGNOSIS — G90A Postural orthostatic tachycardia syndrome (POTS): Secondary | ICD-10-CM | POA: Diagnosis present

## 2020-05-19 DIAGNOSIS — R1310 Dysphagia, unspecified: Secondary | ICD-10-CM | POA: Diagnosis present

## 2020-05-19 DIAGNOSIS — T380X5A Adverse effect of glucocorticoids and synthetic analogues, initial encounter: Secondary | ICD-10-CM | POA: Diagnosis not present

## 2020-05-19 DIAGNOSIS — Z79899 Other long term (current) drug therapy: Secondary | ICD-10-CM

## 2020-05-19 DIAGNOSIS — R29898 Other symptoms and signs involving the musculoskeletal system: Secondary | ICD-10-CM | POA: Diagnosis present

## 2020-05-19 DIAGNOSIS — F840 Autistic disorder: Secondary | ICD-10-CM | POA: Diagnosis present

## 2020-05-19 DIAGNOSIS — I951 Orthostatic hypotension: Secondary | ICD-10-CM | POA: Diagnosis present

## 2020-05-19 DIAGNOSIS — Z20822 Contact with and (suspected) exposure to covid-19: Secondary | ICD-10-CM | POA: Diagnosis present

## 2020-05-19 DIAGNOSIS — Y92239 Unspecified place in hospital as the place of occurrence of the external cause: Secondary | ICD-10-CM | POA: Diagnosis not present

## 2020-05-19 DIAGNOSIS — Z832 Family history of diseases of the blood and blood-forming organs and certain disorders involving the immune mechanism: Secondary | ICD-10-CM

## 2020-05-19 DIAGNOSIS — R296 Repeated falls: Secondary | ICD-10-CM | POA: Diagnosis present

## 2020-05-19 DIAGNOSIS — T17928A Food in respiratory tract, part unspecified causing other injury, initial encounter: Secondary | ICD-10-CM | POA: Diagnosis present

## 2020-05-19 DIAGNOSIS — G35 Multiple sclerosis: Secondary | ICD-10-CM | POA: Diagnosis present

## 2020-05-19 HISTORY — DX: Multiple sclerosis: G35

## 2020-05-19 HISTORY — DX: Multiple sclerosis, unspecified: G35.D

## 2020-05-19 LAB — BASIC METABOLIC PANEL
Anion gap: 11 (ref 5–15)
BUN: 14 mg/dL (ref 6–20)
CO2: 21 mmol/L — ABNORMAL LOW (ref 22–32)
Calcium: 9.4 mg/dL (ref 8.9–10.3)
Chloride: 107 mmol/L (ref 98–111)
Creatinine, Ser: 0.79 mg/dL (ref 0.61–1.24)
GFR calc Af Amer: >60 mL/min (ref >60)
GFR calc non Af Amer: >60 mL/min (ref >60)
Glucose, Bld: 111 mg/dL — ABNORMAL HIGH (ref 70–99)
Potassium: 3.6 mmol/L (ref 3.5–5.1)
Sodium: 139 mmol/L (ref 135–145)

## 2020-05-19 LAB — CBC
HCT: 45.5 % (ref 39.0–52.0)
Hemoglobin: 15.3 g/dL (ref 13.0–17.0)
MCH: 30.2 pg (ref 26.0–34.0)
MCHC: 33.6 g/dL (ref 30.0–36.0)
MCV: 89.7 fL (ref 80.0–100.0)
Platelets: 262 10*3/uL (ref 150–400)
RBC: 5.07 MIL/uL (ref 4.22–5.81)
RDW: 13.4 % (ref 11.5–15.5)
WBC: 9.1 10*3/uL (ref 4.0–10.5)
nRBC: 0 % (ref 0.0–0.2)

## 2020-05-19 MED ORDER — GADOBUTROL 1 MMOL/ML IV SOLN
6.0000 mL | Freq: Once | INTRAVENOUS | Status: AC | PRN
  Administered 2020-05-19: 6 mL via INTRAVENOUS

## 2020-05-19 NOTE — ED Triage Notes (Signed)
Pt accompanied by mother who reports pt has hx of MS and autism. For the past week pt has had increased extremity weakness and tremors. Pt also reports a syncopal episode yesterday. Pt a.o. denies any pain

## 2020-05-20 ENCOUNTER — Emergency Department (HOSPITAL_COMMUNITY): Payer: Medicaid Other

## 2020-05-20 DIAGNOSIS — F329 Major depressive disorder, single episode, unspecified: Secondary | ICD-10-CM | POA: Diagnosis present

## 2020-05-20 DIAGNOSIS — I951 Orthostatic hypotension: Secondary | ICD-10-CM | POA: Diagnosis present

## 2020-05-20 DIAGNOSIS — T380X5A Adverse effect of glucocorticoids and synthetic analogues, initial encounter: Secondary | ICD-10-CM | POA: Diagnosis not present

## 2020-05-20 DIAGNOSIS — G35 Multiple sclerosis: Secondary | ICD-10-CM | POA: Diagnosis present

## 2020-05-20 DIAGNOSIS — E876 Hypokalemia: Secondary | ICD-10-CM | POA: Diagnosis not present

## 2020-05-20 DIAGNOSIS — Z832 Family history of diseases of the blood and blood-forming organs and certain disorders involving the immune mechanism: Secondary | ICD-10-CM | POA: Diagnosis not present

## 2020-05-20 DIAGNOSIS — G4700 Insomnia, unspecified: Secondary | ICD-10-CM | POA: Diagnosis not present

## 2020-05-20 DIAGNOSIS — F840 Autistic disorder: Secondary | ICD-10-CM | POA: Diagnosis present

## 2020-05-20 DIAGNOSIS — R29810 Facial weakness: Secondary | ICD-10-CM | POA: Diagnosis present

## 2020-05-20 DIAGNOSIS — Z20822 Contact with and (suspected) exposure to covid-19: Secondary | ICD-10-CM | POA: Diagnosis present

## 2020-05-20 DIAGNOSIS — R2689 Other abnormalities of gait and mobility: Secondary | ICD-10-CM | POA: Diagnosis not present

## 2020-05-20 DIAGNOSIS — G90A Postural orthostatic tachycardia syndrome (POTS): Secondary | ICD-10-CM | POA: Diagnosis present

## 2020-05-20 DIAGNOSIS — R32 Unspecified urinary incontinence: Secondary | ICD-10-CM | POA: Diagnosis present

## 2020-05-20 DIAGNOSIS — R1310 Dysphagia, unspecified: Secondary | ICD-10-CM | POA: Diagnosis present

## 2020-05-20 DIAGNOSIS — T17928A Food in respiratory tract, part unspecified causing other injury, initial encounter: Secondary | ICD-10-CM | POA: Diagnosis present

## 2020-05-20 DIAGNOSIS — D72829 Elevated white blood cell count, unspecified: Secondary | ICD-10-CM | POA: Diagnosis not present

## 2020-05-20 DIAGNOSIS — I959 Hypotension, unspecified: Secondary | ICD-10-CM | POA: Diagnosis not present

## 2020-05-20 DIAGNOSIS — I498 Other specified cardiac arrhythmias: Secondary | ICD-10-CM

## 2020-05-20 DIAGNOSIS — Z79899 Other long term (current) drug therapy: Secondary | ICD-10-CM | POA: Diagnosis not present

## 2020-05-20 DIAGNOSIS — J90 Pleural effusion, not elsewhere classified: Secondary | ICD-10-CM | POA: Diagnosis present

## 2020-05-20 DIAGNOSIS — X58XXXA Exposure to other specified factors, initial encounter: Secondary | ICD-10-CM | POA: Diagnosis present

## 2020-05-20 DIAGNOSIS — K219 Gastro-esophageal reflux disease without esophagitis: Secondary | ICD-10-CM | POA: Diagnosis not present

## 2020-05-20 DIAGNOSIS — N390 Urinary tract infection, site not specified: Secondary | ICD-10-CM | POA: Diagnosis not present

## 2020-05-20 DIAGNOSIS — Y92239 Unspecified place in hospital as the place of occurrence of the external cause: Secondary | ICD-10-CM | POA: Diagnosis not present

## 2020-05-20 DIAGNOSIS — R29898 Other symptoms and signs involving the musculoskeletal system: Secondary | ICD-10-CM | POA: Diagnosis not present

## 2020-05-20 DIAGNOSIS — R296 Repeated falls: Secondary | ICD-10-CM | POA: Diagnosis present

## 2020-05-20 LAB — SARS CORONAVIRUS 2 BY RT PCR (HOSPITAL ORDER, PERFORMED IN ~~LOC~~ HOSPITAL LAB): SARS Coronavirus 2: NEGATIVE

## 2020-05-20 MED ORDER — ACETAMINOPHEN 650 MG RE SUPP: 650.0000 mg | Freq: Four times a day (QID) | RECTAL | Status: DC | PRN

## 2020-05-20 MED ORDER — ACETAMINOPHEN 325 MG PO TABS: 650.0000 mg | ORAL_TABLET | Freq: Four times a day (QID) | ORAL | Status: DC | PRN

## 2020-05-20 MED ORDER — PANTOPRAZOLE SODIUM 40 MG IV SOLR
40.0000 mg | INTRAVENOUS | Status: DC
  Administered 2020-05-20 – 2020-05-23 (×4): 40 mg via INTRAVENOUS
  Filled 2020-05-20 (×4): qty 40

## 2020-05-20 MED ORDER — MIDODRINE HCL 5 MG PO TABS
5.0000 mg | ORAL_TABLET | Freq: Three times a day (TID) | ORAL | Status: DC
  Administered 2020-05-20 – 2020-05-24 (×12): 5 mg via ORAL
  Filled 2020-05-20 (×12): qty 1

## 2020-05-20 MED ORDER — SODIUM CHLORIDE 0.9 % IV SOLN
1000.0000 mg | Freq: Every day | INTRAVENOUS | Status: AC
  Administered 2020-05-20 – 2020-05-24 (×5): 1000 mg via INTRAVENOUS
  Filled 2020-05-20 (×6): qty 8

## 2020-05-20 MED ORDER — PANTOPRAZOLE SODIUM 40 MG PO TBEC: 40.0000 mg | DELAYED_RELEASE_TABLET | Freq: Every day | ORAL | Status: DC

## 2020-05-20 MED ORDER — ENOXAPARIN SODIUM 40 MG/0.4ML ~~LOC~~ SOLN
40.0000 mg | SUBCUTANEOUS | Status: DC
  Administered 2020-05-20 – 2020-05-23 (×4): 40 mg via SUBCUTANEOUS
  Filled 2020-05-20 (×4): qty 0.4

## 2020-05-20 NOTE — ED Notes (Signed)
Dinner ordered 

## 2020-05-20 NOTE — ED Notes (Addendum)
Dawn (mom) Cell phone [phone]

## 2020-05-20 NOTE — H&P (Signed)
History and Physical    Jaime [name] DGL:[id number] DOB: 1995-10-31 DOA: 05/19/2020  Referring MD/NP/PA: Gwyneth Sprout, MD PCP: Patient, No Pcp Per  Patient coming from: home  Chief Complaint: Weakness  I have personally briefly reviewed patient's old medical records in [facility]   HPI: Jaime [name] is a 24 y.o. male with medical history significant of autism, depression, POTS, and multiple sclerosis diagnosed 12/2019 presents with worsening weakness over the last 2-3 weeks.  Notes that he has been having difficulty walking and had been dragging his right leg.  Previously able to walk unassisted, but was now requiring a walker.  Noted associated symptoms of difficulty talking/stuttering, difficulty swallowing food, blurry vision out of the right eye, intermittent urinary/stool incontinence, and blackouts.   He admits that he has intermittently gotten choked on his food.  His mother notes that he had a follow-up appointment with neurology after his hospitalization in March, but was just now able to get a follow-up appointment that was scheduled for yesterday.  ED Course: Upon admission to the emergency department patient was seen to be afebrile, pulse 79-106, and all other vital signs maintained.  Labs were relatively unremarkable. MRI of the brain showed severe white matter disease in a pattern consistent with multiple sclerosis showing approximately 20 lesions. Chest x-ray showed tiny bilateral pleural effusions with normal heart size.  Neurology had evaluated the patient and started him on on Solu-Medrol 1 g IV.  TRH called to admit.  Review of Systems  Constitutional: Positive for malaise/fatigue. Negative for fever.  HENT: Negative for ear discharge and nosebleeds.   Eyes: Positive for blurred vision. Negative for discharge.  Respiratory: Negative for cough and shortness of breath.   Cardiovascular: Negative for chest pain and leg swelling.  Gastrointestinal: Negative  for abdominal pain, nausea and vomiting.  Genitourinary: Negative for dysuria and urgency.       Positive for urinary incontine  Musculoskeletal: Positive for falls. Negative for myalgias.  Neurological: Positive for speech change, focal weakness and loss of consciousness.  Psychiatric/Behavioral: Negative for memory loss and suicidal ideas.    Past Medical History:  Diagnosis Date  . Autism   . Depression   . MS (multiple sclerosis) (HCC)     History reviewed. No pertinent surgical history.   reports that he has never smoked. He has never used smokeless tobacco. He reports that he does not drink alcohol and does not use drugs.  No Known Allergies  Family History  Problem Relation Age of Onset  . Clotting disorder Mother     Prior to Admission medications   Medication Sig Start Date End Date Taking? Authorizing Provider  midodrine (PROAMATINE) 5 MG tablet Take 1 tablet (5 mg total) by mouth 3 (three) times daily with meals. 01/09/20  Yes Lovorn, Aundra Millet, MD  pantoprazole (PROTONIX) 40 MG tablet Take 1 tablet (40 mg total) by mouth daily. 12/26/19  Yes Angiulli, Mcarthur Rossetti, PA-C  acetaminophen (TYLENOL) 325 MG tablet Take 2 tablets (650 mg total) by mouth every 6 (six) hours as needed for mild pain (or Fever >/= 101). Patient not taking: Reported on 05/20/2020 12/25/19   Charlton Amor, PA-C    Physical Exam:  Constitutional: Thin young male currently in no acute distress  Vitals:   05/20/20 0820 05/20/20 1030 05/20/20 1242 05/20/20 1307  BP: 100/78 119/87 101/66   Pulse: 84 (!) 106 79   Resp: 16 16 18    Temp:   98.4 F (36.9 C)  TempSrc:   Oral   SpO2: 98% 98% 100%   Weight:    60 kg  Height:    5\' 9"  (1.753 m)   Eyes: PERRL, lids and conjunctivae normal ENMT: Mucous membranes are moist. Posterior pharynx clear of any exudate or lesions.  Neck: normal, supple, no masses, no thyromegaly Respiratory: clear to auscultation bilaterally, no wheezing, no crackles. Normal  respiratory effort. No accessory muscle use.  Cardiovascular: Tachycardic, no murmurs / rubs / gallops. No extremity edema. 2+ pedal pulses. No carotid bruits.  Abdomen: no tenderness, no masses palpated. No hepatosplenomegaly. Bowel sounds positive.  Musculoskeletal: no clubbing / cyanosis. No joint deformity upper and lower extremities. Good ROM, no contractures. Normal muscle tone.  Skin: no rashes, lesions, ulcers. No induration Neurologic: CN 2-12 grossly intact.  Right-sided facial droop.  Muscle strength appears to be 4+/5 in all extremities.  Speech is slowed and intermittently slurred. Psychiatric: Autistic. Alert and oriented x 3.  Flat affect.    Labs on Admission: I have personally reviewed following labs and imaging studies  CBC: Recent Labs  Lab 05/19/20 2000  WBC 9.1  HGB 15.3  HCT 45.5  MCV 89.7  PLT 262   Basic Metabolic Panel: Recent Labs  Lab 05/19/20 2000  NA 139  K 3.6  CL 107  CO2 21*  GLUCOSE 111*  BUN 14  CREATININE 0.79  CALCIUM 9.4   GFR: Estimated Creatinine Clearance: 121.9 mL/min (by C-G formula based on SCr of 0.79 mg/dL). Liver Function Tests: No results for input(s): AST, ALT, ALKPHOS, BILITOT, PROT, ALBUMIN in the last 168 hours. No results for input(s): LIPASE, AMYLASE in the last 168 hours. No results for input(s): AMMONIA in the last 168 hours. Coagulation Profile: No results for input(s): INR, PROTIME in the last 168 hours. Cardiac Enzymes: No results for input(s): CKTOTAL, CKMB, CKMBINDEX, TROPONINI in the last 168 hours. BNP (last 3 results) No results for input(s): PROBNP in the last 8760 hours. HbA1C: No results for input(s): HGBA1C in the last 72 hours. CBG: No results for input(s): GLUCAP in the last 168 hours. Lipid Profile: No results for input(s): CHOL, HDL, LDLCALC, TRIG, CHOLHDL, LDLDIRECT in the last 72 hours. Thyroid Function Tests: No results for input(s): TSH, T4TOTAL, FREET4, T3FREE, THYROIDAB in the last 72  hours. Anemia Panel: No results for input(s): VITAMINB12, FOLATE, FERRITIN, TIBC, IRON, RETICCTPCT in the last 72 hours. Urine analysis:    Component Value Date/Time   COLORURINE YELLOW 12/21/2019 1817   APPEARANCEUR CLEAR 12/21/2019 1817   LABSPEC 1.010 12/21/2019 1817   PHURINE 7.0 12/21/2019 1817   GLUCOSEU NEGATIVE 12/21/2019 1817   HGBUR NEGATIVE 12/21/2019 1817   BILIRUBINUR NEGATIVE 12/21/2019 1817   KETONESUR NEGATIVE 12/21/2019 1817   PROTEINUR NEGATIVE 12/21/2019 1817   NITRITE POSITIVE (A) 12/21/2019 1817   LEUKOCYTESUR NEGATIVE 12/21/2019 1817   Sepsis Labs: No results found for this or any previous visit (from the past 240 hour(s)).   Radiological Exams on Admission: MR Brain W and Wo Contrast  Result Date: 05/19/2020 CLINICAL DATA:  Multiple sclerosis follow-up. Increased extremity weakness and tremors. EXAM: MRI HEAD WITHOUT AND WITH CONTRAST TECHNIQUE: Multiplanar, multiecho pulse sequences of the brain and surrounding structures were obtained without and with intravenous contrast. CONTRAST:  72mL GADAVIST GADOBUTROL 1 MMOL/ML IV SOLN COMPARISON:  12/16/2019 FINDINGS: Brain: Numerous bilateral white matter lesions in a pattern consistent with multiple sclerosis. Many of the lesions have decreased in size. Most of the lesions show corresponding low T1-weighted signal.  There are multiple contrast-enhancing lesions, including 4 cerebellar lesions, 2 of which are clustered in the left middle cerebellar peduncle. The largest supratentorial lesion is located in the right frontal white matter. In total, there are approximately 20 contrast-enhancing lesions. Vascular: Normal flow voids. Skull and upper cervical spine: There are multiple lesions noted at the cervicomedullary junction. Sinuses/Orbits: Negative. Other: None. IMPRESSION: 1. Severe white matter disease in a pattern consistent with multiple sclerosis. 2. Numerous (approximately 20) active demyelinating lesions throughout the  supra- and infratentorial brain. Electronically Signed   By: Deatra Robinson M.D.   On: 05/19/2020 21:23   DG Chest Port 1 View  Result Date: 05/20/2020 CLINICAL DATA:  Cough. EXAM: PORTABLE CHEST 1 VIEW COMPARISON:  12/12/2019. FINDINGS: Mediastinum and hilar structures normal. Lungs are clear. Tiny bilateral pleural effusions cannot be excluded. Heart size normal. No acute bony abnormality identified. IMPRESSION: Tiny bilateral pleural effusions cannot be excluded. No focal infiltrate identified. Electronically Signed   By: Maisie Fus  Register   On: 05/20/2020 13:44    EKG: Independently reviewed.  Sinus tachycardia 101 bpm  Assessment/Plan Multiple sclerosis exacerbation: Acute.  Patient presents with several complaints.  MRI of the brain significant for approximately 20 active demyelinating lesions.  Not placed on any immune modulating therapy as an outpatient. 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   GI prophylaxis: Protonix DVT prophylaxis: lovenox Code Status: Full  Family Communication: Mother updated over the phone Disposition Plan: TBD Consults called: Neurology  Admission status: Inpatient   Clydie Braun MD [facility] Pager [phone]   If 7PM-7AM, please contact night-coverage [email or web address] Password [facility]  05/20/2020, 2:28 PM

## 2020-05-20 NOTE — ED Provider Notes (Signed)
[facility] EMERGENCY DEPARTMENT Provider Note   CSN: [id number] Arrival date & time: 05/19/20  1809     History Chief Complaint  Patient presents with   Weakness    KORBAN SHEARER is a 24 y.o. male.  Patient is a 24 year old male with a history of autism, recent diagnosis of MS and March of this year and depression who is presenting today with his mom for weakness that has been worsening over the last 2 to 2-1/2 weeks.  Mom reports after his admission in March patient was doing relatively well.  He was ambulating without assistance and able to go up and down the stairs.  He was needing some mild assistance with dressing and getting into the bathtub but otherwise had been doing well.  Mom reports over the last 2-1/2 weeks he started to have worsening weakness in his right lower extremity.  Now he is unable to ambulate without a walker and assistance.  He is dragging his right leg and also feels some weakness in the right hand.  Mom is also noticed he has been having slurred speech has more difficulty eating and is eating very slowly and having intermittent incontinence of urine and stool.  He denies pain anywhere in his body.  He has had no fever, cough, congestion, shortness of breath or vomiting.  He is not vaccinated against Covid at this time.  Mom reports he also has a history of pots disease and he takes midodrine and yesterday he did have a syncopal event with standing.  However he denies any feelings of dizziness or near syncope while sitting and denies any palpitations at this time.  The history is provided by the patient and a parent.  Weakness Severity:  Severe Onset quality:  Gradual Duration:  2 weeks Timing:  Constant Progression:  Worsening Chronicity:  New      Past Medical History:  Diagnosis Date   Autism    Depression    MS (multiple sclerosis) (HCC)     Patient Active Problem List   Diagnosis Date Noted   Poor balance 01/09/2020    Autism spectrum disorder    Tachycardia    Dysautonomia orthostatic hypotension syndrome (HCC)    Multiple sclerosis exacerbation (HCC) 12/17/2019   Lower extremity weakness 12/12/2019   Demyelinating disease of central nervous system (HCC) 12/12/2019   MS (multiple sclerosis) (HCC) 12/12/2019    History reviewed. No pertinent surgical history.     Family History  Problem Relation Age of Onset   Clotting disorder Mother     Social History   Tobacco Use   Smoking status: Never Smoker   Smokeless tobacco: Never Used  Vaping Use   Vaping Use: Never used  Substance Use Topics   Alcohol use: No   Drug use: Never    Home Medications Prior to Admission medications   Medication Sig Start Date End Date Taking? Authorizing Provider  midodrine (PROAMATINE) 5 MG tablet Take 1 tablet (5 mg total) by mouth 3 (three) times daily with meals. 01/09/20  Yes Lovorn, Aundra Millet, MD  pantoprazole (PROTONIX) 40 MG tablet Take 1 tablet (40 mg total) by mouth daily. 12/26/19  Yes Angiulli, Mcarthur Rossetti, PA-C  acetaminophen (TYLENOL) 325 MG tablet Take 2 tablets (650 mg total) by mouth every 6 (six) hours as needed for mild pain (or Fever >/= 101). Patient not taking: Reported on 05/20/2020 12/25/19   Angiulli, Mcarthur Rossetti, PA-C    Allergies    Patient has no known  allergies.  Review of Systems   Review of Systems  Neurological: Positive for weakness.  All other systems reviewed and are negative.   Physical Exam Updated Vital Signs BP 101/66 (BP Location: Left Arm)    Pulse 79    Temp 98.4 F (36.9 C) (Oral)    Resp 18    Ht 5\' 9"  (1.753 m)    Wt 60 kg    SpO2 100%    BMI 19.53 kg/m   Physical Exam Vitals and nursing note reviewed.  Constitutional:      General: He is not in acute distress.    Appearance: Normal appearance. He is well-developed and normal weight.  HENT:     Head: Normocephalic and atraumatic.  Eyes:     Conjunctiva/sclera: Conjunctivae normal.     Pupils: Pupils are  equal, round, and reactive to light.  Cardiovascular:     Rate and Rhythm: Normal rate and regular rhythm.     Heart sounds: No murmur heard.   Pulmonary:     Effort: Pulmonary effort is normal. No respiratory distress.     Breath sounds: Normal breath sounds. No wheezing or rales.  Abdominal:     General: There is no distension.     Palpations: Abdomen is soft.     Tenderness: There is no abdominal tenderness. There is no guarding or rebound.  Musculoskeletal:        General: No tenderness. Normal range of motion.     Cervical back: Normal range of motion and neck supple.     Right lower leg: No edema.     Left lower leg: No edema.  Skin:    General: Skin is warm and dry.     Findings: No erythema or rash.  Neurological:     Mental Status: He is alert and oriented to person, place, and time.     Comments: Tremulousness in the right arm and leg with purposeful movement.  5 out of 5 strength in the bilateral upper and lower extremities.  Mild pronator drift noted in the right arm and leg.  Slowed speech but no specific slurred speech at this time.  Slowed response to questioning.  Psychiatric:     Comments: Poor eye contact, slowed response      ED Results / Procedures / Treatments   Labs (all labs ordered are listed, but only abnormal results are displayed) Labs Reviewed  BASIC METABOLIC PANEL - Abnormal; Notable for the following components:      Result Value   CO2 21 (*)    Glucose, Bld 111 (*)    All other components within normal limits  CBC    EKG None  Radiology MR Brain W and Wo Contrast  Result Date: 05/19/2020 CLINICAL DATA:  Multiple sclerosis follow-up. Increased extremity weakness and tremors. EXAM: MRI HEAD WITHOUT AND WITH CONTRAST TECHNIQUE: Multiplanar, multiecho pulse sequences of the brain and surrounding structures were obtained without and with intravenous contrast. CONTRAST:  20mL GADAVIST GADOBUTROL 1 MMOL/ML IV SOLN COMPARISON:  12/16/2019  FINDINGS: Brain: Numerous bilateral white matter lesions in a pattern consistent with multiple sclerosis. Many of the lesions have decreased in size. Most of the lesions show corresponding low T1-weighted signal. There are multiple contrast-enhancing lesions, including 4 cerebellar lesions, 2 of which are clustered in the left middle cerebellar peduncle. The largest supratentorial lesion is located in the right frontal white matter. In total, there are approximately 20 contrast-enhancing lesions. Vascular: Normal flow voids. Skull and upper cervical spine:  There are multiple lesions noted at the cervicomedullary junction. Sinuses/Orbits: Negative. Other: None. IMPRESSION: 1. Severe white matter disease in a pattern consistent with multiple sclerosis. 2. Numerous (approximately 20) active demyelinating lesions throughout the supra- and infratentorial brain. Electronically Signed   By: Deatra Robinson M.D.   On: 05/19/2020 21:23    Procedures Procedures (including critical care time)  Medications Ordered in ED Medications  gadobutrol (GADAVIST) 1 MMOL/ML injection 6 mL (6 mLs Intravenous Contrast Given 05/19/20 2050)    ED Course  I have reviewed the triage vital signs and the nursing notes.  Pertinent labs & imaging results that were available during my care of the patient were reviewed by me and considered in my medical decision making (see chart for details).    MDM Rules/Calculators/A&P                         24 year old male presenting today with worsening weakness.  Concern for MS exacerbation with MRI that showed multiple active lesions in various areas of the brain.  Patient has no infectious symptoms at this time or other reasons that could exacerbate his symptoms.  CBC, BMP within normal limits.  Covid test pending.  Chest x-ray to ensure no evidence of aspiration pneumonia.  Spoke with neurology who will come and evaluate the patient and start high-dose steroids for MS exacerbation.   Patient will require admission.  MDM Number of Diagnoses or Management Options   Amount and/or Complexity of Data Reviewed Clinical lab tests: reviewed and ordered Tests in the radiology section of CPT: ordered and reviewed Decide to obtain previous medical records or to obtain history from someone other than the patient: yes Obtain history from someone other than the patient: yes Review and summarize past medical records: yes Discuss the patient with other providers: yes Independent visualization of images, tracings, or specimens: yes  Risk of Complications, Morbidity, and/or Mortality Presenting problems: high Diagnostic procedures: low Management options: moderate  Patient Progress Patient progress: stable   Final Clinical Impression(s) / ED Diagnoses Final diagnoses:  Multiple sclerosis exacerbation (HCC)    Rx / DC Orders ED Discharge Orders    None       Gwyneth Sprout, MD 05/20/20 1335

## 2020-05-20 NOTE — Consult Note (Addendum)
Neurology Consultation  Reason for Consult: MS flare Referring Physician: Dr. Anitra Lauth  CC: Worsening gait instability, slow response, incontinence, pocketing and choking on food.  History is obtained from: Chart and patient  HPI: Jaime [name] is a 24 y.o. male with autism, depression and recent diagnosis of multiple sclerosis. This is a 24 year old male presenting back in March 2021 for evaluation of multitude of neurological complaints including difficulty walking, frequent falls, slurred speech and generalized weakness. At that time MRI of C-spine, T-spine, L-spine showed extensive T2 flair signal abnormality throughout the supratentorial and infratentorial cerebral hemispheres consistent with demyelinating disease with associated enhancement numerous lesions consistent with active demyelination. It also showed extensive signal abnormality involving essentially the entire thoracic spine cord consistent with demyelinating disease with patchy postcontrast enhancement at T1-2 and T10-11. It also showed extensive signal abnormality in the cervical spine involving essentially majority of the spinal cord consistent with demyelinating disease.  Patient returns the ED today secondary to over the past few weeks having worsening of gait to the point where he was able to walk by himself now needing to use a walker. Initially he needed help with dressing and bathroom however now he is a full assist. He is now dragging his right leg, having decreased response, noted to be pocketing his food and he admits to at times choking on his food. He states he feels he has a new stutter and now has had a few periods of incontinence to which she does not know he had urinated on himself. Patient was to have been seen by Dr. Epimenio Foot however after phone call he was not on his list to be seen. Mother states that he was to be seen by Dr. Terrace Arabia today however Dr. Terrace Arabia is on vacation. It is unclear when his follow-up was to be had. He  is not on any immune modulating medications at this time.  MRI was obtained while in the ED showing severe white matter disease in the pattern consistent with multiple sclerosis. Numerous approximately 20 active demyelinating lesions throughout the supra and infratentorial brain.   Family History  Problem Relation Age of Onset  . Clotting disorder Mother    Social History:   reports that he has never smoked. He has never used smokeless tobacco. He reports that he does not drink alcohol and does not use drugs.  Medications No current facility-administered medications for this encounter.  Current Outpatient Medications:  .  midodrine (PROAMATINE) 5 MG tablet, Take 1 tablet (5 mg total) by mouth 3 (three) times daily with meals., Disp: 90 tablet, Rfl: 11 .  pantoprazole (PROTONIX) 40 MG tablet, Take 1 tablet (40 mg total) by mouth daily., Disp: 30 tablet, Rfl: 0 .  acetaminophen (TYLENOL) 325 MG tablet, Take 2 tablets (650 mg total) by mouth every 6 (six) hours as needed for mild pain (or Fever >/= 101). (Patient not taking: Reported on 05/20/2020), Disp:  , Rfl:   ROS:    General ROS: negative for - chills, fatigue, fever, night sweats, weight gain or weight loss Psychological ROS: negative for - behavioral disorder, hallucinations, memory difficulties, mood swings or suicidal ideation Ophthalmic ROS: negative for - blurry vision, double vision, eye pain or loss of vision ENT ROS: negative for - epistaxis, nasal discharge, oral lesions, sore throat, tinnitus or vertigo Allergy and Immunology ROS: negative for - hives or itchy/watery eyes Hematological and Lymphatic ROS: negative for - bleeding problems, bruising or swollen lymph nodes Endocrine ROS: negative for - galactorrhea,  hair pattern changes, polydipsia/polyuria or temperature intolerance Respiratory ROS: negative for - cough, hemoptysis, shortness of breath or wheezing Cardiovascular ROS: negative for - chest pain, dyspnea on  exertion, edema or irregular heartbeat Gastrointestinal ROS: negative for - abdominal pain, diarrhea, hematemesis, nausea/vomiting or stool incontinence Genito-Urinary ROS: Positive for -urinary incontinence Musculoskeletal ROS: Positive for - jmuscular weakness Neurological ROS: as noted in HPI Dermatological ROS: negative for rash and skin lesion changes  Exam: Current vital signs: BP 101/66 (BP Location: Left Arm)   Pulse 79   Temp 98.4 F (36.9 C) (Oral)   Resp 18   Ht 5\' 9"  (1.753 m)   Wt 60 kg   SpO2 100%   BMI 19.53 kg/m  Vital signs in last 24 hours: Temp:  [97.3 F (36.3 C)-98.9 F (37.2 C)] 98.4 F (36.9 C) (08/17 1242) Pulse Rate:  [79-106] 79 (08/17 1242) Resp:  [16-18] 18 (08/17 1242) BP: (100-119)/(66-87) 101/66 (08/17 1242) SpO2:  [98 %-100 %] 100 % (08/17 1242) Weight:  [60 kg] 60 kg (08/17 1307)   Constitutional: Appears well-developed and well-nourished.  Psych: Affect appropriate to situation Eyes: No scleral injection HENT: No OP obstrucion Head: Normocephalic.  Cardiovascular: Normal rate and regular rhythm.  Respiratory: Effort normal, non-labored breathing GI: Soft.  No distension. There is no tenderness.  Skin: WDI  Neuro: Mental Status: Patient is alert and oriented to person place and month. Speech is clear with no dysarthria or aphasia. Patient is able to follow commands to the best of his ability. Cranial Nerves: II: Visual Fields are full.  III,IV, VI: EOMI without ptosis or diploplia. Pupils equal, round and reactive to light-he does have saccadic eye movements and nystagmus of far gaze both laterally and vertically V: Facial sensation is symmetric to temperature VII: Right facial droop VIII: hearing is intact to voice X: Palat elevates symmetrically XI: Shoulder shrug is symmetric. XII: tongue is midline without atrophy or fasciculations.  Motor: Tone is normal. Bulk is normal. 5/5 strength was present in all four extremities.   Sensory: Sensation is symmetric to light touch and temperature in the arms and legs. DSS Deep Tendon Reflexes: 3+ bilateral upper extremities, 3+ knee jerk bilaterally, 4+ on right ankle jerk with sustained clonus, 2+ left Achilles Plantars: Upgoing bilaterally Cerebellar: Bilateral finger-to-nose dysmetria. Also noted action tremor during finger-to-nose.    Labs I have reviewed labs in epic and the results pertinent to this consultation are:   CBC    Component Value Date/Time   WBC 9.1 05/19/2020 2000   RBC 5.07 05/19/2020 2000   HGB 15.3 05/19/2020 2000   HCT 45.5 05/19/2020 2000   PLT 262 05/19/2020 2000   MCV 89.7 05/19/2020 2000   MCH 30.2 05/19/2020 2000   MCHC 33.6 05/19/2020 2000   RDW 13.4 05/19/2020 2000   LYMPHSABS 3.4 12/21/2019 0506   MONOABS 0.6 12/21/2019 0506   EOSABS 0.4 12/21/2019 0506   BASOSABS 0.0 12/21/2019 0506    CMP     Component Value Date/Time   NA 139 05/19/2020 2000   K 3.6 05/19/2020 2000   CL 107 05/19/2020 2000   CO2 21 (L) 05/19/2020 2000   GLUCOSE 111 (H) 05/19/2020 2000   BUN 14 05/19/2020 2000   CREATININE 0.79 05/19/2020 2000   CALCIUM 9.4 05/19/2020 2000   PROT 6.0 (L) 12/18/2019 0409   ALBUMIN 3.3 (L) 12/18/2019 0409   ALBUMIN 4.6 12/12/2019 0002   AST 17 12/18/2019 0409   ALT 18 12/18/2019 0409  ALKPHOS 33 (L) 12/18/2019 0409   BILITOT 0.8 12/18/2019 0409   GFRNONAA >60 05/19/2020 2000   GFRAA >60 05/19/2020 2000    Lipid Panel  No results found for: CHOL, TRIG, HDL, CHOLHDL, VLDL, LDLCALC, LDLDIRECT   Imaging I have reviewed the images obtained:   MRI examination of the brain-severe white matter disease in the pattern consistent with multiple sclerosis. Numerous approximately 20 active demyelinating lesions throughout the supra-and infratentorial brain  Felicie Morn PA-C [facility] [phone]  M-F  (9:00 am- 5:00 PM)  05/20/2020, 2:17 PM   I have seen the patient reviewed the above  note.  Original work-up included anti-Jo one, ENA, ACE, RPR, and MO all of which were negative.  CSF cell count and differential in protein and glucose were normal.  IgG index was elevated.  Assessment:  This is a 24 year old male with MS diagnosed in March of this year who is not currently on any immune modulating therapy.  MRI shows multiple active demyelinating lesions, and he will need IV Solu-Medrol to treat his current acute exacerbation.  He will need aggressive immune modulating therapy as an outpatient, and we have already been in communication with an outpatient neurologist to try and keep him from falling through the cracks.  Impression: -Multiple active demyelinating lesions Recommendations: -IV Solu-Medrol 1000 mg daily for 5 days -Protonix -Physical therapy -Patient has been discussed with Dr. Epimenio Foot who will have office call patient and get patient in at some point next week.  Ritta Slot, MD [facility] [phone]  If 7pm- 7am, please page neurology on call as listed in AMION.

## 2020-05-21 DIAGNOSIS — R2689 Other abnormalities of gait and mobility: Secondary | ICD-10-CM

## 2020-05-21 DIAGNOSIS — R29898 Other symptoms and signs involving the musculoskeletal system: Secondary | ICD-10-CM

## 2020-05-21 NOTE — Evaluation (Signed)
Clinical/Bedside Swallow Evaluation Patient Details  Name: Jaime [name] MRN: [id number] Date of Birth: [birth year]  Today's Date: 05/21/2020 Time: SLP Start Time (ACUTE ONLY): 1445 SLP Stop Time (ACUTE ONLY): 1500 SLP Time Calculation (min) (ACUTE ONLY): 15 min  Past Medical History:  Past Medical History:  Diagnosis Date  . Autism   . Depression   . MS (multiple sclerosis) (HCC)    Past Surgical History: History reviewed. No pertinent surgical history. HPI:  Jaime [name] is a 24 y.o. male with medical history significant of autism, depression, POTS, and multiple sclerosis diagnosed 12/2019 presents with worsening weakness over the last 2-3 weeks.  Notes that he has been having difficulty walking and had been dragging his right leg.  Previously able to walk unassisted, but was now requiring a walker.  Noted associated symptoms of difficulty talking/stuttering, difficulty swallowing food, blurry vision out of the right eye, intermittent urinary/stool incontinence, and blackouts.   Assessment / Plan / Recommendation Clinical Impression  Mr. Schiff reports that his swallowing difficulty has now resolved. He reports he was having some difficulty chewing yesterday, but that he has had no trouble today after "getting the medicine." He is in with MS exacerbation. He also reports that his difficulty talking/stuttering has resolved since yesterday. Oral mech exam revealed ongoing mild R sided weakness/asymmetry of face, which did not appear to be impacting tongue. He was seen with graham cracker and thin liquids via cup and straw in sequential sips. Pt was noted to be very careful with mastication of the graham cracker, but had timely and adequate oral clearance with no need for liquid wash. He had no s/s aspiration with any consistency. Given that he has MS exacerbation, suspect he had true deficits prior to methylprednisolone administration, but that they have now improved. Given the  improvement with no difficulty seen at bedside, suspect further imaging would also show functional swallow. Should pt have another exacerbation with dysphagia, recommend imaging at that time for swallow recommendations. He was educated on risk of aspiration with MS and s/s dysphagia to monitor. He demonstrated understanding.  At this time, continue regular consistency solids and thin liquids with meds taken as tolerated. No further ST at this time. Recommend ST to complete MBSS should swallowing deficits re-appear, either as inpatient or outpatient.   SLP Visit Diagnosis: Dysphagia, unspecified (R13.10)    Aspiration Risk  Mild aspiration risk    Diet Recommendation Regular;Thin liquid   Liquid Administration via: Straw;Cup Medication Administration: Whole meds with liquid Supervision: Patient able to self feed Postural Changes: Seated upright at 90 degrees    Other  Recommendations Oral Care Recommendations: Oral care BID   Follow up Recommendations None        Swallow Study   General Date of Onset: 05/21/20 HPI: Jaime [name] is a 24 y.o. male with medical history significant of autism, depression, POTS, and multiple sclerosis diagnosed 12/2019 presents with worsening weakness over the last 2-3 weeks.  Notes that he has been having difficulty walking and had been dragging his right leg.  Previously able to walk unassisted, but was now requiring a walker.  Noted associated symptoms of difficulty talking/stuttering, difficulty swallowing food, blurry vision out of the right eye, intermittent urinary/stool incontinence, and blackouts. Type of Study: Bedside Swallow Evaluation Previous Swallow Assessment: n/a Diet Prior to this Study: Regular;Thin liquids Temperature Spikes Noted: No Respiratory Status: Room air History of Recent Intubation: No Behavior/Cognition: Alert;Cooperative;Pleasant mood Oral Cavity Assessment: Within Functional Limits Oral Care Completed  by SLP: No Oral  Cavity - Dentition: Adequate natural dentition Vision: Functional for self-feeding Self-Feeding Abilities: Able to feed self Patient Positioning: Upright in chair Baseline Vocal Quality: Normal Volitional Swallow: Able to elicit    Oral/Motor/Sensory Function Overall Oral Motor/Sensory Function: Mild impairment Facial ROM: Reduced right Facial Symmetry: Abnormal symmetry right Facial Strength: Reduced right Facial Sensation: Within Functional Limits Lingual ROM: Within Functional Limits Lingual Symmetry: Within Functional Limits Lingual Strength: Within Functional Limits Lingual Sensation: Within Functional Limits Velum: Within Functional Limits Mandible: Within Functional Limits   Ice Chips Ice chips: Not tested   Thin Liquid Thin Liquid: Within functional limits Presentation: Straw;Cup    Nectar Thick Nectar Thick Liquid: Not tested   Honey Thick Honey Thick Liquid: Not tested   Puree Puree: Not tested   Solid     Solid: Within functional limits Presentation: Self Fed     Devinn Voshell P. Javius Sylla, M.S., CCC-SLP Speech-Language Pathologist Acute Rehabilitation Services Pager: [phone]  Susanne Borders Ixchel Duck 05/21/2020,3:04 PM

## 2020-05-21 NOTE — Evaluation (Signed)
Physical Therapy Evaluation Patient Details Name: Jaime [name] MRN: [id number] DOB: 06-09-1996 Today's Date: 05/21/2020   History of Present Illness  Jaime [name] is a 24 y.o. male with medical history significant of autism, depression, POTS, and multiple sclerosis diagnosed 12/2019 presents with worsening weakness over the last 2-3 weeks.  Notes that he has been having difficulty walking and had been dragging his right leg.  Previously able to walk unassisted, but was now requiring a walker.  Noted associated symptoms of difficulty talking/stuttering, difficulty swallowing food, blurry vision out of the right eye, intermittent urinary/stool incontinence, and blackouts.  Clinical Impression  Pt presents to PT with deficits in functional mobility, gait, balance, power, strength, sensation, and endurance. Pt requires some physical assistance along with UE support to maintain balance during all functional mobility this session. PT notes pt with trembling of LE and core musculature during ambulation, possibly due to fatigue after recently completing session with OT. Pt is at a high falls risk currently due to sensation and strength deficits in R side and will benefit from continued acute PT POC to aide in a return to his prior level of function and to reduce falls risk. PT recommends CIR placement at this time to reduce falls risk and restore independence.    Follow Up Recommendations CIR;Supervision/Assistance - 24 hour    Equipment Recommendations  Rolling walker with 5" wheels    Recommendations for Other Services Rehab consult     Precautions / Restrictions Precautions Precautions: Fall Precaution Comments: hx of POTS Restrictions Weight Bearing Restrictions: No      Mobility  Bed Mobility Overal bed mobility: Needs Assistance Bed Mobility: Supine to Sit     Supine to sit: Min guard     General bed mobility comments: increased time and close guard for safety due to ataxic  movement patterns  Transfers Overall transfer level: Needs assistance Equipment used: Rolling walker (2 wheeled) Transfers: Sit to/from Stand Sit to Stand: Min assist         General transfer comment: assist to rise and steady and maintain balance; cues for safe hand placement on RW  Ambulation/Gait Ambulation/Gait assistance: Min assist;Min guard Gait Distance (Feet): 30 Feet Assistive device: Rolling walker (2 wheeled) Gait Pattern/deviations: Step-to pattern Gait velocity: reduced Gait velocity interpretation: <1.8 ft/sec, indicate of risk for recurrent falls General Gait Details: pt with short step to gait, increased lateral shift of pelvis during gait cycle with noted trembling of trunk and LEs during ambulation  Stairs            Wheelchair Mobility    Modified Rankin (Stroke Patients Only)       Balance Overall balance assessment: Needs assistance Sitting-balance support: Bilateral upper extremity supported;Feet unsupported Sitting balance-Leahy Scale: Fair     Standing balance support: Bilateral upper extremity supported;During functional activity Standing balance-Leahy Scale: Poor Standing balance comment: reliant on external support with RW for mobility; and BUE support on sink when standing to brush teeth                             Pertinent Vitals/Pain Pain Assessment: No/denies pain    Home Living Family/patient expects to be discharged to:: Private residence Living Arrangements: Parent;Other relatives Available Help at Discharge: Family (mom, 68 and 29 y.o.sisters) Type of Home: Apartment Home Access: Stairs to enter Entrance Stairs-Rails: Right Entrance Stairs-Number of Steps: 2 Home Layout: Two level;Bed/bath upstairs Home Equipment: None Additional Comments: per  pt report, lives in a two story apartment; reports recent falls at home. May need to clarify home setup details with family as conflicting reports given from chart  reviews    Prior Function Level of Independence: Needs assistance   Gait / Transfers Assistance Needed: reports 'furniture walking' around home  ADL's / Homemaking Assistance Needed: pt reports needing assistance for tub transfers recently, reports a recent fall in the bathroom  Comments: enjoys playing guitar, playing zelda, used to volunteer at [employer]   Dominant Hand: Right    Extremity/Trunk Assessment   Upper Extremity Assessment Upper Extremity Assessment: Defer to OT evaluation RUE Deficits / Details: decreased coordination; difficulty manipulating toothpaste in session RUE Coordination: decreased fine motor;decreased gross motor LUE Deficits / Details: decreased coordination; difficulty with in hand manipulation LUE Coordination: decreased fine motor;decreased gross motor    Lower Extremity Assessment Lower Extremity Assessment: RLE deficits/detail;LLE deficits/detail RLE Deficits / Details: grossly 4+/5 RLE Sensation: decreased light touch    Cervical / Trunk Assessment Cervical / Trunk Assessment: Normal  Communication   Communication: Expressive difficulties  Cognition Arousal/Alertness: Awake/alert Behavior During Therapy: WFL for tasks assessed/performed Overall Cognitive Status: History of cognitive impairments - at baseline                                 General Comments: pt with history of autism at baseline. Has delayed processing time and occasional difficulty with expressive language, may be close to baseline      General Comments General comments (skin integrity, edema, etc.): VSS on RA    Exercises     Assessment/Plan    PT Assessment Patient needs continued PT services  PT Problem List Decreased strength;Decreased activity tolerance;Decreased balance;Decreased mobility;Decreased knowledge of use of DME;Decreased knowledge of precautions;Impaired sensation       PT Treatment Interventions DME instruction;Gait  training;Stair training;Functional mobility training;Therapeutic activities;Therapeutic exercise;Balance training;Neuromuscular re-education;Cognitive remediation;Patient/family education    PT Goals (Current goals can be found in the Care Plan section)  Acute Rehab PT Goals Patient Stated Goal: reduce shakiness PT Goal Formulation: With patient Time For Goal Achievement: 06/04/20 Potential to Achieve Goals: Good Additional Goals Additional Goal #1: Pt will score >45/56 on BERG balance test to indicate a reduced falls risk    Frequency Min 3X/week   Barriers to discharge        Co-evaluation               AM-PAC PT "6 Clicks" Mobility  Outcome Measure Help needed turning from your back to your side while in a flat bed without using bedrails?: None Help needed moving from lying on your back to sitting on the side of a flat bed without using bedrails?: A Little Help needed moving to and from a bed to a chair (including a wheelchair)?: A Little Help needed standing up from a chair using your arms (e.g., wheelchair or bedside chair)?: A Little Help needed to walk in hospital room?: A Little Help needed climbing 3-5 steps with a railing? : A Lot 6 Click Score: 18    End of Session   Activity Tolerance: Patient tolerated treatment well Patient left: in chair;with call bell/phone within reach;with chair alarm set Nurse Communication: Mobility status PT Visit Diagnosis: Unsteadiness on feet (R26.81);Other abnormalities of gait and mobility (R26.89);Muscle weakness (generalized) (M62.81);History of falling (Z91.81);Other symptoms and signs involving the nervous system (R29.898)  Time: 0910-0929 PT Time Calculation (min) (ACUTE ONLY): 19 min   Charges:   PT Evaluation $PT Eval Moderate Complexity: 1 Mod          Arlyss Gandy, PT, DPT Acute Rehabilitation Pager: [phone]   Arlyss Gandy 05/21/2020, 9:55 AM

## 2020-05-21 NOTE — Progress Notes (Signed)
[facility]  PROGRESS NOTE  CATHAN GEARIN HRC:[id number] DOB: [birth year] DOA: 05/19/2020 PCP: Patient, No Pcp Per Admit date - 05/19/2020   Admitting Physician Clydie Braun, MD  Outpatient Primary MD for the patient is Patient, No Pcp Per  LOS - 1 Brief Narrative   Jaime [name] is a 24 y.o. year old male with medical history significant for multiple sclerosis recently diagnosed in March 2021 not chronic any immunomodulating therapy who presented with worsening weakness, gait abnormality, difficulty getting out of tub, dragging right leg, episodes of urine incontinence, choking on food and was found to have active demyelinating lesions on MRI in the ED concerning for severe multiple sclerosis exacerbation throughout the supra and infratentorial brain.  Neurology was consulted and recommended pulse Solu-Medrol dose therapy started on 8/17.    Subjective  Today he feels his right leg is improving in strength  A & P    Recently diagnosed MS with multiple active demyelinating lesions, consistent with acute exacerbation -Neurology recommends continuing pulse dose IV Solu-Medrol for total 5 days, today is day 2 -Prophylactic Protonix -Continue PT while in hospital -Neurology arranging close outpatient follow-up with Dr. Epimenio Foot  Postural orthostatic tachycardic syndrome -Continue home midodrine  High functioning autism, stable at baseline   Family Communication  :  will call mother  Code Status : Full  Disposition Plan  :  Patient is from home. Anticipated d/c date:  Greater than 3 days. Barriers to d/c or necessity for inpatient status:  Needs completion of pulse steroids for acute MS flare Consults  : Neurology  Procedures  : None DVT Prophylaxis  :  Lovenox   Lab Results  Component Value Date   PLT 262 05/19/2020    Diet :  Diet Order            DIET SOFT Room service appropriate? Yes; Fluid consistency: Thin  Diet effective now                   Inpatient Medications Scheduled Meds: . enoxaparin (LOVENOX) injection  40 mg Subcutaneous Q24H  . midodrine  5 mg Oral TID WC  . pantoprazole (PROTONIX) IV  40 mg Intravenous Q24H   Continuous Infusions: . methylPREDNISolone (SOLU-MEDROL) injection 1,000 mg (05/21/20 0947)   PRN Meds:.acetaminophen **OR** acetaminophen  Antibiotics  :   Anti-infectives (From admission, onward)   None       Objective   Vitals:   05/20/20 2119 05/21/20 0253 05/21/20 0616 05/21/20 1420  BP: 106/61 99/60 99/68  (!) 107/57  Pulse: 75 76 78 87  Resp: 18 15 16 16   Temp: (!) 97.5 F (36.4 C) 98.4 F (36.9 C) 97.9 F (36.6 C) 98.1 F (36.7 C)  TempSrc: Oral Oral Oral Oral  SpO2: 98% 98% 99% 100%  Weight:      Height:        SpO2: 100 %  Wt Readings from Last 3 Encounters:  05/20/20 60 kg  01/09/20 60.6 kg  12/11/19 54.4 kg     Intake/Output Summary (Last 24 hours) at 05/21/2020 1652 Last data filed at 05/21/2020 1029 Gross per 24 hour  Intake 318.3 ml  Output 300 ml  Net 18.3 ml    Physical Exam:     Awake Alert, Oriented X 3, Normal affect Right arm strength less than left arm strength, still able to move against gravity and some resistance, right lower extremity 4 out of 5 [place].AT, Normal respiratory effort on room air, CTAB RRR,No Gallops,Rubs  or new Murmurs,  +ve B.Sounds, Abd Soft, No tenderness, No rebound, guarding or rigidity. No Cyanosis, No new Rash or bruise     I have personally reviewed the following:   Data Reviewed:  CBC Recent Labs  Lab 05/19/20 2000  WBC 9.1  HGB 15.3  HCT 45.5  PLT 262  MCV 89.7  MCH 30.2  MCHC 33.6  RDW 13.4    Chemistries  Recent Labs  Lab 05/19/20 2000  NA 139  K 3.6  CL 107  CO2 21*  GLUCOSE 111*  BUN 14  CREATININE 0.79  CALCIUM 9.4   ------------------------------------------------------------------------------------------------------------------ No results for input(s): CHOL, HDL, LDLCALC, TRIG,  CHOLHDL, LDLDIRECT in the last 72 hours.  Lab Results  Component Value Date   HGBA1C 5.3 12/14/2019   ------------------------------------------------------------------------------------------------------------------ No results for input(s): TSH, T4TOTAL, T3FREE, THYROIDAB in the last 72 hours.  Invalid input(s): FREET3 ------------------------------------------------------------------------------------------------------------------ No results for input(s): VITAMINB12, FOLATE, FERRITIN, TIBC, IRON, RETICCTPCT in the last 72 hours.  Coagulation profile No results for input(s): INR, PROTIME in the last 168 hours.  No results for input(s): DDIMER in the last 72 hours.  Cardiac Enzymes No results for input(s): CKMB, TROPONINI, MYOGLOBIN in the last 168 hours.  Invalid input(s): CK ------------------------------------------------------------------------------------------------------------------ No results found for: BNP  Micro Results Recent Results (from the past 240 hour(s))  SARS Coronavirus 2 by RT PCR (hospital order, performed in [facility] hospital lab) Nasopharyngeal Nasopharyngeal Swab     Status: None   Collection Time: 05/20/20  1:20 PM   Specimen: Nasopharyngeal Swab  Result Value Ref Range Status   SARS Coronavirus 2 NEGATIVE NEGATIVE Final    Comment: (NOTE) SARS-CoV-2 target nucleic acids are NOT DETECTED.  The SARS-CoV-2 RNA is generally detectable in upper and lower respiratory specimens during the acute phase of infection. The lowest concentration of SARS-CoV-2 viral copies this assay can detect is 250 copies / mL. A negative result does not preclude SARS-CoV-2 infection and should not be used as the sole basis for treatment or other patient management decisions.  A negative result may occur with improper specimen collection / handling, submission of specimen other than nasopharyngeal swab, presence of viral mutation(s) within the areas targeted by this assay,  and inadequate number of viral copies (<250 copies / mL). A negative result must be combined with clinical observations, patient history, and epidemiological information.  Fact Sheet for Patients:   BoilerBrush.com.cy  Fact Sheet for Healthcare Providers: https://pope.com/  This test is not yet approved or  cleared by the [place] FDA and has been authorized for detection and/or diagnosis of SARS-CoV-2 by FDA under an Emergency Use Authorization (EUA).  This EUA will remain in effect (meaning this test can be used) for the duration of the COVID-19 declaration under Section 564(b)(1) of the Act, 21 U.S.C. section 360bbb-3(b)(1), unless the authorization is terminated or revoked sooner.  Performed at [facility], [address] [place], [place] [address]     Radiology Reports MR Brain W and Wo Contrast  Result Date: 05/19/2020 CLINICAL DATA:  Multiple sclerosis follow-up. Increased extremity weakness and tremors. EXAM: MRI HEAD WITHOUT AND WITH CONTRAST TECHNIQUE: Multiplanar, multiecho pulse sequences of the brain and surrounding structures were obtained without and with intravenous contrast. CONTRAST:  43mL GADAVIST GADOBUTROL 1 MMOL/ML IV SOLN COMPARISON:  12/16/2019 FINDINGS: Brain: Numerous bilateral white matter lesions in a pattern consistent with multiple sclerosis. Many of the lesions have decreased in size. Most of the lesions show corresponding low  T1-weighted signal. There are multiple contrast-enhancing lesions, including 4 cerebellar lesions, 2 of which are clustered in the left middle cerebellar peduncle. The largest supratentorial lesion is located in the right frontal white matter. In total, there are approximately 20 contrast-enhancing lesions. Vascular: Normal flow voids. Skull and upper cervical spine: There are multiple lesions noted at the cervicomedullary junction. Sinuses/Orbits: Negative. Other: None.  IMPRESSION: 1. Severe white matter disease in a pattern consistent with multiple sclerosis. 2. Numerous (approximately 20) active demyelinating lesions throughout the supra- and infratentorial brain. Electronically Signed   By: Deatra Robinson M.D.   On: 05/19/2020 21:23   DG Chest Port 1 View  Result Date: 05/20/2020 CLINICAL DATA:  Cough. EXAM: PORTABLE CHEST 1 VIEW COMPARISON:  12/12/2019. FINDINGS: Mediastinum and hilar structures normal. Lungs are clear. Tiny bilateral pleural effusions cannot be excluded. Heart size normal. No acute bony abnormality identified. IMPRESSION: Tiny bilateral pleural effusions cannot be excluded. No focal infiltrate identified. Electronically Signed   By: Maisie Fus  Register   On: 05/20/2020 13:44     Time Spent in minutes  30     Laverna Peace M.D on 05/21/2020 at 4:52 PM  To page go to [email or web address] - password [email or web address]

## 2020-05-21 NOTE — Progress Notes (Signed)
Inpatient Rehab Admissions Coordinator Note:   Per therapy recommendations, pt was screened for CIR candidacy by Estill Dooms, PT, DPT.  At this time pt on IV solumedrol for MS exac.  We will follow for progress with therapy, as pt may progress past needing CIR.  Please contact me with questions.   Estill Dooms, PT, DPT [phone] 05/21/20 11:52 AM

## 2020-05-21 NOTE — Progress Notes (Signed)
NEUROLOGY PROGRESS NOTE   Subjective: Patient is feeling stronger with his right leg.  No complaints at this time.  Exam: Vitals:   05/21/20 0616 05/21/20 1420  BP: 99/68 (!) 107/57  Pulse: 78 87  Resp: 16 16  Temp: 97.9 F (36.6 C) 98.1 F (36.7 C)  SpO2: 99% 100%   Neuro:  Mental Status: Alert, oriented, thought content appropriate.  Speech fluent without evidence of aphasia.  Able to follow 3 step commands without difficulty. Cranial Nerves: II:  Visual fields grossly normal,  III,IV, VI: ptosis not present, extra-ocular motions intact bilaterally pupils equal, round, reactive to light and accommodation, saccadic pursuit. V,VII: Right facial droop VIII: hearing normal bilaterally IX,X: Palate rises midline XI: bilateral shoulder shrug XII: midline tongue extension Motor: Right : Upper extremity   5/5    Left:     Upper extremity   5/5  Lower extremity   5/5     Lower extremity   5/5 Sensory: Intact throughout Deep Tendon Reflexes: 3+ bilateral upper extremities, 3+ knee jerk bilaterally, 4+ on the right ankle jerk with sustained clonus, 2+ left ankle jerk.  Plantars: Upgoing bilaterally Cerebellar: bilateral finger-nose dysmetria along with action tremor   Medications:  Scheduled: . enoxaparin (LOVENOX) injection  40 mg Subcutaneous Q24H  . midodrine  5 mg Oral TID WC  . pantoprazole (PROTONIX) IV  40 mg Intravenous Q24H   Continuous: . methylPREDNISolone (SOLU-MEDROL) injection 1,000 mg (05/21/20 0947)   OFB:PZWCHENIDPOEU **OR** acetaminophen  Pertinent Labs/Diagnostics:  MR Brain W and Wo Contrast  Result Date: 05/19/2020  IMPRESSION: 1. Severe white matter disease in a pattern consistent with multiple sclerosis. 2. Numerous (approximately 20) active demyelinating lesions throughout the supra- and infratentorial brain. Electronically Signed   By: Deatra Robinson M.D.   On: 05/19/2020 21:23       Felicie Morn PA-C [facility] [phone]  Assessment:  24 year old male with multiple sclerosis diagnosed in March of this year.  He currently is not on any immune modulating therapy.  MRI showed multiple active demyelinating lesions.  Patient has had 2 doses of methylprednisolone 1000 mg.  Tomorrow will be day 3.He will need aggressive immune modulating therapy as an outpatient, and we have already been in communication with an outpatient neurologist to try and keep him from falling through the cracks Impression: -Multiple active demyelinating lesions  Recommendations: -Continue Solu-Medrol 1000 mg daily for 3 more days -Protonix -Physical therapy -Patient has been discussed with Dr. Epimenio Foot who will have office call patient and get patient working at some point next week.    05/21/2020, 2:52 PM

## 2020-05-21 NOTE — Evaluation (Signed)
Occupational Therapy Evaluation Patient Details Name: Jaime [name] MRN: [id number] DOB: November 15, 1995 Today's Date: 05/21/2020    History of Present Illness Jaime [name] is a 24 y.o. male with medical history significant of autism, depression, POTS, and multiple sclerosis diagnosed 12/2019 presents with worsening weakness over the last 2-3 weeks.  Notes that he has been having difficulty walking and had been dragging his right leg.  Previously able to walk unassisted, but was now requiring a walker.  Noted associated symptoms of difficulty talking/stuttering, difficulty swallowing food, blurry vision out of the right eye, intermittent urinary/stool incontinence, and blackouts.   Clinical Impression   PTA pt living with mother and was functioning at mod I level until recently. Pt reports as of late, he has needed assist to get in and out of tub shower. He also endorses a recent fall in the bathroom. At time of eval, pt presents with ability to complete bed mobility at min guard level and sit <> stands at min A level with RW. Pt presents with ataxic like movement patterns, poor dynamic balance, poor UE coordination and [facility] skills. Pt is currently completing BADL at min- mod A level. Suspect his cognition is baseline. Pt does reports blurriness from R eye, but is able to read majority of signs presented to him in room with his usual corrective lenses. Given current status, recommend CIR to support safety, BADL engagement, independent PLOF. OT will continue to follow per POC listed below.    Follow Up Recommendations  CIR    Equipment Recommendations  Tub/shower seat    Recommendations for Other Services Rehab consult     Precautions / Restrictions Precautions Precautions: Fall Precaution Comments: hx of POTS Restrictions Weight Bearing Restrictions: No      Mobility Bed Mobility Overal bed mobility: Needs Assistance Bed Mobility: Supine to Sit     Supine to sit: Min guard      General bed mobility comments: increased time and close guard for safety due to ataxic movement patterns  Transfers Overall transfer level: Needs assistance Equipment used: Rolling walker (2 wheeled) Transfers: Sit to/from Stand Sit to Stand: Min assist         General transfer comment: assist to rise and steady and maintain balance; cues for safe hand placement on RW    Balance Overall balance assessment: Needs assistance Sitting-balance support: Bilateral upper extremity supported;Feet unsupported Sitting balance-Leahy Scale: Fair     Standing balance support: Bilateral upper extremity supported;During functional activity Standing balance-Leahy Scale: Poor Standing balance comment: reliant on external support with RW for mobility; and BUE support on sink when standing to brush teeth                           ADL either performed or assessed with clinical judgement   ADL Overall ADL's : Needs assistance/impaired Eating/Feeding: Set up;Sitting   Grooming: Minimal assistance;Standing Grooming Details (indicate cue type and reason): standing at sink to complete oral care- occasional min A needed to maintain balance. Difficulty with in hand manipulation noted during grooming Upper Body Bathing: Minimal assistance;Sitting   Lower Body Bathing: Moderate assistance;Sit to/from stand;Sitting/lateral leans   Upper Body Dressing : Minimal assistance;Sitting   Lower Body Dressing: Moderate assistance;Sit to/from stand;Sitting/lateral leans   Toilet Transfer: Minimal assistance;Ambulation;RW;Regular Teacher, adult education Details (indicate cue type and reason): assist to maintain balance and safety Toileting- Clothing Manipulation and Hygiene: Minimal assistance;Sit to/from stand Toileting - Architect Details (indicate cue  type and reason): has been having incontinence episodes Tub/ Shower Transfer: Moderate assistance;Rolling walker Tub/Shower Transfer  Details (indicate cue type and reason): has had falls in bathroom Functional mobility during ADLs: Minimal assistance;Rolling walker;Cueing for safety       Vision Baseline Vision/History: Wears glasses Wears Glasses: At all times Patient Visual Report: Blurring of vision Vision Assessment?: Vision impaired- to be further tested in functional context Additional Comments: reports R eye blurry vision; no diplopia. Able to read signs in environment     Perception     Praxis      Pertinent Vitals/Pain Pain Assessment: No/denies pain     Hand Dominance Right   Extremity/Trunk Assessment Upper Extremity Assessment Upper Extremity Assessment: Generalized weakness;RUE deficits/detail;LUE deficits/detail RUE Deficits / Details: decreased coordination; difficulty manipulating toothpaste in session RUE Coordination: decreased fine motor;decreased gross motor LUE Deficits / Details: decreased coordination; difficulty with in hand manipulation LUE Coordination: decreased fine motor;decreased gross motor   Lower Extremity Assessment Lower Extremity Assessment: Defer to PT evaluation       Communication Communication Communication: Expressive difficulties (occaisonally misspeaking of words, increased processing time)   Cognition Arousal/Alertness: Awake/alert Behavior During Therapy: WFL for tasks assessed/performed Overall Cognitive Status: History of cognitive impairments - at baseline                                 General Comments: pt with history of autism at baseline. Has delayed processing time and occasional difficulty with expressive language, suspect this to be baseline.   General Comments       Exercises     Shoulder Instructions      Home Living Family/patient expects to be discharged to:: Private residence Living Arrangements: Parent Available Help at Discharge: Family (mother/2 younger sister) Type of Home: Apartment Home Access: Stairs to  enter Secretary/administrator of Steps: 1-2   Home Layout: Two level;Bed/bath upstairs Alternate Level Stairs-Number of Steps: full flight   Bathroom Shower/Tub: Chief Strategy Officer: Standard Bathroom Accessibility: Yes How Accessible: Accessible via walker Home Equipment: None   Additional Comments: per pt report, lives in a two story apartment; reports recent falls at home      Prior Functioning/Environment Level of Independence: Needs assistance  Gait / Transfers Assistance Needed: reports 'furniture walking' around home ADL's / Homemaking Assistance Needed: lately has needed assist in and out of shower due to unsteadiness. Has had fall in bathroom   Comments: enjoys playing guitar, playing zelda, used to volunteer at [employer]        OT Problem List: Decreased strength;Impaired vision/perception;Decreased knowledge of use of DME or AE;Decreased coordination;Decreased activity tolerance;Impaired UE functional use;Decreased cognition;Impaired balance (sitting and/or standing);Decreased safety awareness;Impaired sensation      OT Treatment/Interventions: Self-care/ADL training;Therapeutic exercise;Patient/family education;Balance training;Energy conservation;Therapeutic activities;DME and/or AE instruction;Visual/perceptual remediation/compensation    OT Goals(Current goals can be found in the care plan section) Acute Rehab OT Goals Patient Stated Goal: reduce shakiness OT Goal Formulation: With patient Time For Goal Achievement: 06/04/20 Potential to Achieve Goals: Good  OT Frequency: Min 2X/week   Barriers to D/C:            Co-evaluation              AM-PAC OT "6 Clicks" Daily Activity     Outcome Measure Help from another person eating meals?: A Little Help from another person taking care of personal grooming?: A Little Help from another  person toileting, which includes using toliet, bedpan, or urinal?: A Lot Help from another person bathing  (including washing, rinsing, drying)?: A Little Help from another person to put on and taking off regular upper body clothing?: A Little Help from another person to put on and taking off regular lower body clothing?: A Lot 6 Click Score: 16   End of Session Equipment Utilized During Treatment: Gait belt;Rolling walker Nurse Communication: Mobility status  Activity Tolerance: Patient tolerated treatment well Patient left: Other (comment) (in hallway passed off to PT)  OT Visit Diagnosis: Unsteadiness on feet (R26.81);Other abnormalities of gait and mobility (R26.89);Other symptoms and signs involving the nervous system (R29.898);Muscle weakness (generalized) (M62.81);History of falling (Z91.81)                Time: 1540-0867 OT Time Calculation (min): 28 min Charges:  OT General Charges $OT Visit: 1 Visit OT Evaluation $OT Eval Moderate Complexity: 1 Mod OT Treatments $Self Care/Home Management : 8-22 mins  Dalphine Handing, MSOT, OTR/L Acute Rehabilitation Services [facility] Office Number: [phone] Pager: [phone]  Dalphine Handing 05/21/2020, 9:34 AM

## 2020-05-21 NOTE — Social Work (Signed)
Acknowledging consult for "Need assistance with making sure her Medicare card is transition from previous pediatric doctor to Dr. Earnest Bailey." Pt has Medicaid, he will have assigned PCP through Medicaid, no Medicare noted.   CIR is current recommended- will need consult to IP Rehab to be evaluated.   Octavio Graves, MSW, LCSW [facility] Clinical Social Work

## 2020-05-22 ENCOUNTER — Telehealth: Payer: Self-pay | Admitting: *Deleted

## 2020-05-22 LAB — CBC WITH DIFFERENTIAL/PLATELET
Abs Immature Granulocytes: 0.1 10*3/uL — ABNORMAL HIGH (ref 0.00–0.07)
Basophils Absolute: 0 10*3/uL (ref 0.0–0.1)
Basophils Relative: 0 %
Eosinophils Absolute: 0 10*3/uL (ref 0.0–0.5)
Eosinophils Relative: 0 %
HCT: 40.2 % (ref 39.0–52.0)
Hemoglobin: 13.7 g/dL (ref 13.0–17.0)
Immature Granulocytes: 1 %
Lymphocytes Relative: 3 %
Lymphs Abs: 0.7 10*3/uL (ref 0.7–4.0)
MCH: 30.7 pg (ref 26.0–34.0)
MCHC: 34.1 g/dL (ref 30.0–36.0)
MCV: 90.1 fL (ref 80.0–100.0)
Monocytes Absolute: 1.1 10*3/uL — ABNORMAL HIGH (ref 0.1–1.0)
Monocytes Relative: 5 %
Neutro Abs: 19.4 10*3/uL — ABNORMAL HIGH (ref 1.7–7.7)
Neutrophils Relative %: 91 %
Platelets: 254 10*3/uL (ref 150–400)
RBC: 4.46 MIL/uL (ref 4.22–5.81)
RDW: 13.8 % (ref 11.5–15.5)
WBC: 21.3 10*3/uL — ABNORMAL HIGH (ref 4.0–10.5)
nRBC: 0 % (ref 0.0–0.2)

## 2020-05-22 LAB — BASIC METABOLIC PANEL
Anion gap: 10 (ref 5–15)
BUN: 17 mg/dL (ref 6–20)
CO2: 22 mmol/L (ref 22–32)
Calcium: 9.7 mg/dL (ref 8.9–10.3)
Chloride: 108 mmol/L (ref 98–111)
Creatinine, Ser: 0.92 mg/dL (ref 0.61–1.24)
GFR calc Af Amer: >60 mL/min (ref >60)
GFR calc non Af Amer: >60 mL/min (ref >60)
Glucose, Bld: 145 mg/dL — ABNORMAL HIGH (ref 70–99)
Potassium: 4 mmol/L (ref 3.5–5.1)
Sodium: 140 mmol/L (ref 135–145)

## 2020-05-22 NOTE — Consult Note (Signed)
Physical Medicine and Rehabilitation Consult Reason for Consult: Decreased functional mobility Referring Physician: [facility]   HPI: Jaime [name] is a 24 y.o. right-handed male with history of documented mild autism, depression and recent diagnosis of multiple sclerosis March 2021 followed by Sula Soda and DrSater not on any immune modulating medications at this time.  Patient did require reexcision.  Patient lives in a two-level home bed and bath upstairs 2 steps to entry.  Mother provides assistance with bathing as well as meal preparation.  Lives with his mother and 54 and 39 year old siblings.  He presented on 05/19/2020 with progressive gait abnormality and needing more help with ADLs dragging his right leg and pocketing of his food as well as bouts of urinary incontinence.  MRI of the brain showed severe white matter disease in a pattern consistent with multiple sclerosis numerous active demyelinating lesions throughout the supra and infratentorial brain.  Admission chemistries unremarkable glucose 111, SARS coronavirus negative.  Neurology follow-up currently maintained on intravenous Solu-Medrol for 5 doses initiated on 05/20/2020 subcutaneous Lovenox for DVT prophylaxis.  Tolerating a regular diet.  Therapy evaluations completed with recommendations of physical medicine rehab consult.  Review of Systems  Constitutional: Negative for chills and fever.  HENT: Negative for hearing loss.   Eyes: Negative for blurred vision and double vision.  Respiratory: Negative for cough and shortness of breath.   Cardiovascular: Negative for chest pain, palpitations and leg swelling.  Gastrointestinal: Positive for constipation. Negative for heartburn and nausea.  Genitourinary: Negative for dysuria, flank pain and hematuria.       Bouts of of incontinence of bladder  Skin: Negative for rash.  Neurological: Positive for sensory change, speech change and weakness.  Psychiatric/Behavioral: Positive for  depression.       Autism  All other systems reviewed and are negative.  Past Medical History:  Diagnosis Date  . Autism   . Depression   . MS (multiple sclerosis) (HCC)    History reviewed. No pertinent surgical history. Family History  Problem Relation Age of Onset  . Clotting disorder Mother    Social History:  reports that he has never smoked. He has never used smokeless tobacco. He reports that he does not drink alcohol and does not use drugs. Allergies: No Known Allergies Medications Prior to Admission  Medication Sig Dispense Refill  . midodrine (PROAMATINE) 5 MG tablet Take 1 tablet (5 mg total) by mouth 3 (three) times daily with meals. 90 tablet 11  . pantoprazole (PROTONIX) 40 MG tablet Take 1 tablet (40 mg total) by mouth daily. 30 tablet 0  . acetaminophen (TYLENOL) 325 MG tablet Take 2 tablets (650 mg total) by mouth every 6 (six) hours as needed for mild pain (or Fever >/= 101). (Patient not taking: Reported on 05/20/2020)      Home: Home Living Family/patient expects to be discharged to:: Private residence Living Arrangements: Parent, Other relatives Available Help at Discharge: Family (mom, 72 and 61 y.o.sisters) Type of Home: Apartment Home Access: Stairs to enter [employer] of Steps: 2 Entrance Stairs-Rails: Right Home Layout: Two level, Bed/bath upstairs Alternate Level Stairs-Number of Steps: full flight Alternate Level Stairs-Rails: Right Bathroom Shower/Tub: Engineer, manufacturing systems: Standard Bathroom Accessibility: Yes Home Equipment: None Additional Comments: per pt report, lives in a two story apartment; reports recent falls at home. May need to clarify home setup details with family as conflicting reports given from chart reviews  Functional History: Prior Function Level of Independence: Needs assistance  Gait / Transfers Assistance Needed: reports 'furniture walking' around home ADL's / Homemaking Assistance Needed: pt reports  needing assistance for tub transfers recently, reports a recent fall in the bathroom Comments: enjoys playing guitar, playing zelda, used to volunteer at [employer] Functional Status:  Mobility: Bed Mobility Overal bed mobility: Needs Assistance Bed Mobility: Supine to Sit Supine to sit: Supervision General bed mobility comments: Pt self elevating bed height, progressing to edge of bed with supervision Transfers Overall transfer level: Needs assistance Equipment used: Rolling walker (2 wheeled) Transfers: Sit to/from Stand Sit to Stand: Min guard General transfer comment: Min guard to rise and steady multiple times from edge of bed, toilet, and recliner Ambulation/Gait Ambulation/Gait assistance: Min assist, Mod assist Gait Distance (Feet): 100 Feet (100", 100") Assistive device: Rolling walker (2 wheeled) Gait Pattern/deviations: Step-through pattern, Decreased stride length, Scissoring, Narrow base of support General Gait Details: Pt ambulating 100 ft then 100 ft with a seated rest break, min-modA for stability. Cues for wider BOS, walker proximity, activity pacing, equal step lengths.  Gait velocity: reduced Gait velocity interpretation: <1.8 ft/sec, indicate of risk for recurrent falls    ADL: ADL Overall ADL's : Needs assistance/impaired Eating/Feeding: Set up, Sitting Grooming: Minimal assistance, Standing Grooming Details (indicate cue type and reason): standing at sink to complete oral care- occasional min A needed to maintain balance. Difficulty with in hand manipulation noted during grooming Upper Body Bathing: Minimal assistance, Sitting Lower Body Bathing: Moderate assistance, Sit to/from stand, Sitting/lateral leans Upper Body Dressing : Minimal assistance, Sitting Lower Body Dressing: Moderate assistance, Sit to/from stand, Sitting/lateral leans Toilet Transfer: Minimal assistance, Ambulation, RW, Regular Toilet Toilet Transfer Details (indicate cue type and reason):  assist to maintain balance and safety Toileting- Clothing Manipulation and Hygiene: Minimal assistance, Sit to/from stand Toileting - Clothing Manipulation Details (indicate cue type and reason): has been having incontinence episodes Tub/ Shower Transfer: Moderate assistance, Rolling walker Tub/Shower Transfer Details (indicate cue type and reason): has had falls in bathroom Functional mobility during ADLs: Minimal assistance, Rolling walker, Cueing for safety  Cognition: Cognition Overall Cognitive Status: History of cognitive impairments - at baseline Orientation Level: Oriented X4 Cognition Arousal/Alertness: Awake/alert Behavior During Therapy: Flat affect Overall Cognitive Status: History of cognitive impairments - at baseline General Comments: Pt with delayed processing/response time, flat affect  Blood pressure 114/71, pulse (!) 50, temperature 97.9 F (36.6 C), temperature source Oral, resp. rate 16, height 5\' 9"  (1.753 m), weight 60 kg, SpO2 99 %. Physical Exam Vitals reviewed.  Constitutional:      General: He is not in acute distress.    Appearance: Normal appearance.  HENT:     Head: Normocephalic and atraumatic.     Right Ear: External ear normal.     Left Ear: External ear normal.     Nose: Nose normal.  Eyes:     General:        Right eye: No discharge.        Left eye: No discharge.     Extraocular Movements: Extraocular movements intact.  Cardiovascular:     Rate and Rhythm: Normal rate and regular rhythm.  Pulmonary:     Effort: Pulmonary effort is normal. No respiratory distress.     Breath sounds: Normal breath sounds. No stridor.  Abdominal:     General: Abdomen is flat. Bowel sounds are normal. There is no distension.  Musculoskeletal:     Cervical back: Normal range of motion and neck supple.     Comments: No  edema or tenderness in extremities  Skin:    General: Skin is warm and dry.  Neurological:     Mental Status: He is alert.     Comments:  Patient is alert cooperative with exam.   Provides name age and date of birth.   Follows simple commands.   No dysarthria noted Motor: 4/5 throughout Generalized ataxia Apraxic speech  Psychiatric:        Mood and Affect: Affect is flat.        Speech: Speech is delayed.     No results found for this or any previous visit (from the past 24 hour(s)). No results found.  Assessment/Plan: Diagnosis: MS exacerbation Labs independently reviewed.  Records reviewed and summated above.  1. Does the need for close, 24 hr/day medical supervision in concert with the patient's rehab needs make it unreasonable for this patient to be served in a less intensive setting? Potentially  2. Co-Morbidities requiring supervision/potential complications: autism, depression (ensure mood does not hinder progress of therapies), recent diagnosis of multiple sclerosis, leukocytosis (likely secondary to steroids, repeat labs, cont to monitor for signs and symptoms of infection, further workup if indicated) 3. Due to bladder management, bowel management, safety, disease management, medication administration and patient education, does the patient require 24 hr/day rehab nursing? Potentially 4. Does the patient require coordinated care of a physician, rehab nurse, therapy disciplines of PT/OT to address physical and functional deficits in the context of the above medical diagnosis(es)? Potentially Addressing deficits in the following areas: balance, endurance, locomotion, strength, transferring, bathing, dressing, toileting and psychosocial support 5. Can the patient actively participate in an intensive therapy program of at least 3 hrs of therapy per day at least 5 days per week? Yes 6. The potential for patient to make measurable gains while on inpatient rehab is good 7. Anticipated functional outcomes upon discharge from inpatient rehab are modified independent and supervision  with PT, modified independent and  supervision with OT, n/a with SLP. 8. Estimated rehab length of stay to reach the above functional goals is: 4-7 days. 9. Anticipated discharge destination: Home 10. Overall Rehab/Functional Prognosis: excellent  RECOMMENDATIONS: This patient's condition is appropriate for continued rehabilitative care in the following setting: CIR if patient does not progress to supervision level of functioning. Patient has agreed to participate in recommended program. Yes Note that insurance prior authorization may be required for reimbursement for recommended care.  Comment: Rehab Admissions Coordinator to follow up.  I have personally performed a face to face diagnostic evaluation, including, but not limited to relevant history and physical exam findings, of this patient and developed relevant assessment and plan.  Additionally, I have reviewed and concur with the physician assistant's documentation above.   Maryla Morrow, MD, ABPMR Mcarthur Rossetti Angiulli, PA-C 05/23/2020

## 2020-05-22 NOTE — Progress Notes (Signed)
Physical Therapy Treatment Patient Details Name: Jaime [name] MRN: [id number] DOB: [birth year] Today's Date: 05/22/2020    History of Present Illness Jaime [name] is a 24 y.o. male with medical history significant of autism, depression, POTS, and multiple sclerosis diagnosed 12/2019 presents with worsening weakness over the last 2-3 weeks.  Notes that he has been having difficulty walking and had been dragging his right leg.  Previously able to walk unassisted, but was now requiring a walker.  Noted associated symptoms of difficulty talking/stuttering, difficulty swallowing food, blurry vision out of the right eye, intermittent urinary/stool incontinence, and blackouts.    PT Comments    Pt progressing steadily towards his physical therapy goals. Session focused on coordination exercises, dynamic sitting balance, and gait training. Pt requiring min-mod assist for mobility. Pt ambulating 15 ft, 10 ft, then 40 ft with a walker and close chair follow. Continues with impaired coordination, weakness, balance deficits, and decreased activity tolerance. Presents as a high fall risk based on decreased gait speed, decreased safety awareness and history of falls. Highly recommend CIR to address deficits and maximize functional independence. Suspect excellent progress given age, motivation and PLOF.     Follow Up Recommendations  CIR;Supervision/Assistance - 24 hour     Equipment Recommendations  Rolling walker with 5" wheels    Recommendations for Other Services       Precautions / Restrictions Precautions Precautions: Fall Restrictions Weight Bearing Restrictions: No    Mobility  Bed Mobility Overal bed mobility: Needs Assistance Bed Mobility: Supine to Sit     Supine to sit: Supervision        Transfers Overall transfer level: Needs assistance Equipment used: Rolling walker (2 wheeled) Transfers: Sit to/from Stand Sit to Stand: Min assist         General transfer  comment: MinA to rise and steady from edge of bed and toilet  Ambulation/Gait Ambulation/Gait assistance: Min assist;Mod assist Gait Distance (Feet): 65 Feet (15", 10", 40") Assistive device: Rolling walker (2 wheeled) Gait Pattern/deviations: Step-through pattern;Decreased stride length;Scissoring;Narrow base of support Gait velocity: reduced Gait velocity interpretation: <1.8 ft/sec, indicate of risk for recurrent falls General Gait Details: Pt ambulating 15 ft to bathroom and back, 10 ft to hallway, and then 40 ft in hallway, requiring seated rest breaks and chair follow. Min-modA for stability, pt sometimes impulsive and attempting to sit before chair is behind him. Cues for wider BOS (pt tending to scissor at some points) and walker proximity. Decreased gross motor coordination   Stairs             Wheelchair Mobility    Modified Rankin (Stroke Patients Only)       Balance Overall balance assessment: Needs assistance Sitting-balance support: Feet supported Sitting balance-Leahy Scale: Fair     Standing balance support: Bilateral upper extremity supported;During functional activity Standing balance-Leahy Scale: Poor Standing balance comment: reliant on external support with RW for mobility; and BUE support on sink when standing to brush teeth                            Cognition Arousal/Alertness: Awake/alert Behavior During Therapy: Flat affect Overall Cognitive Status: History of cognitive impairments - at baseline                                 General Comments: Pt with delayed processing/response time, flat affect  Exercises General Exercises - Lower Extremity Long Arc Quad: Both;10 reps;Seated Hip Flexion/Marching: Both;10 reps;Seated Other Exercises Other Exercises: Seated: dynamic reaching in all planes with BUE'    General Comments        Pertinent Vitals/Pain Pain Assessment: Faces Faces Pain Scale: No hurt     Home Living                      Prior Function            PT Goals (current goals can now be found in the care plan section) Acute Rehab PT Goals Patient Stated Goal: "work on balance" Potential to Achieve Goals: Good Progress towards PT goals: Progressing toward goals    Frequency    Min 4X/week      PT Plan Frequency needs to be updated    Co-evaluation              AM-PAC PT "6 Clicks" Mobility   Outcome Measure  Help needed turning from your back to your side while in a flat bed without using bedrails?: None Help needed moving from lying on your back to sitting on the side of a flat bed without using bedrails?: None Help needed moving to and from a bed to a chair (including a wheelchair)?: A Little Help needed standing up from a chair using your arms (e.g., wheelchair or bedside chair)?: A Little Help needed to walk in hospital room?: A Lot Help needed climbing 3-5 steps with a railing? : A Lot 6 Click Score: 18    End of Session Equipment Utilized During Treatment: Gait belt Activity Tolerance: Patient tolerated treatment well Patient left: in chair;with call bell/phone within reach;with chair alarm set Nurse Communication: Mobility status PT Visit Diagnosis: Unsteadiness on feet (R26.81);Other abnormalities of gait and mobility (R26.89);Muscle weakness (generalized) (M62.81);History of falling (Z91.81);Other symptoms and signs involving the nervous system (W09.811)     Time: 9147-8295 PT Time Calculation (min) (ACUTE ONLY): 36 min  Charges:  $Gait Training: 8-22 mins $Therapeutic Exercise: 8-22 mins                       Lillia Pauls, PT, DPT Acute Rehabilitation Services Pager [phone] Office [phone]    Norval Morton 05/22/2020, 10:16 AM

## 2020-05-22 NOTE — Telephone Encounter (Signed)
Referrals called pt x2 to try and schedule NP appt per Dr. Epimenio Foot request. Per Gerarda Gunther L in referrals: "His mother declined to schedule at this time. She said that he is still in the hospital and she is unsure when he is going to be released. I told her to call us when he is released to schedule."  If they call to schedule, please place in MSNP slot for Dr. Epimenio Foot. Thank you

## 2020-05-22 NOTE — Progress Notes (Addendum)
[facility]  PROGRESS NOTE  Jaime [name] NLZ:[id number] DOB: 05-12-1996 DOA: 05/19/2020 PCP: Patient, No Pcp Per Admit date - 05/19/2020   Admitting Physician Clydie Braun, MD  Outpatient Primary MD for the patient is Patient, No Pcp Per  LOS - 2 Brief Narrative   Jaime [name] is a 24 y.o. year old male with medical history significant for multiple sclerosis recently diagnosed in March 2021 not chronic any immunomodulating therapy who presented with worsening weakness, gait abnormality, difficulty getting out of tub, dragging right leg, episodes of urine incontinence, choking on food and was found to have active demyelinating lesions on MRI in the ED concerning for severe multiple sclerosis exacerbation throughout the supra and infratentorial brain.  Neurology was consulted and recommended pulse Solu-Medrol dose therapy started on 8/17.    Subjective  Today he sitting in bedside chair, continues to note improvement in strength.  Work involved PT  [employer]    Recently diagnosed MS with multiple active demyelinating lesions found on MRI in the setting of worsening gait disturbances, weakness, urinary incontinence, dysphagia, consistent with acute exacerbation of MS, now improving -Neurology recommends continuing pulse dose IV Solu-Medrol for total 5 days, today is day 3 -Prophylactic Protonix -Continue PT while in hospital--recommending CIR versus 24-hour supervision -Neurology arranging close outpatient follow-up with Dr. Epimenio Foot  Postural orthostatic tachycardic syndrome -Continue home midodrine  High functioning autism, stable at baseline   Family Communication  :  will call mother, called on 8/18 but unable to leave voice message, will try again /19  Code Status : Full  Disposition Plan  :  Patient is from home. Anticipated d/c date:  Greater than 3 days. Barriers to d/c or necessity for inpatient status:  Needs completion of pulse steroids for acute MS flare, PT  currently recommending CIR to address current deficits and maximize functional independence, will consult CIR for their assessment Consults  : Neurology  Procedures  : None DVT Prophylaxis  :  Lovenox   Lab Results  Component Value Date   PLT 254 05/22/2020    Diet :  Diet Order            DIET SOFT Room service appropriate? Yes; Fluid consistency: Thin  Diet effective now                  Inpatient Medications Scheduled Meds: . enoxaparin (LOVENOX) injection  40 mg Subcutaneous Q24H  . midodrine  5 mg Oral TID WC  . pantoprazole (PROTONIX) IV  40 mg Intravenous Q24H   Continuous Infusions: . methylPREDNISolone (SOLU-MEDROL) injection 1,000 mg (05/21/20 0947)   PRN Meds:.acetaminophen **OR** acetaminophen  Antibiotics  :   Anti-infectives (From admission, onward)   None       Objective   Vitals:   05/21/20 0616 05/21/20 1420 05/21/20 2119 05/22/20 0436  BP: 99/68 (!) 107/57 120/67 100/62  Pulse: 78 87 92 88  Resp: 16 16 18 18   Temp: 97.9 F (36.6 C) 98.1 F (36.7 C) 98.2 F (36.8 C) 98 F (36.7 C)  TempSrc: Oral Oral Oral Oral  SpO2: 99% 100% 100% 98%  Weight:      Height:        SpO2: 98 %  Wt Readings from Last 3 Encounters:  05/20/20 60 kg  01/09/20 60.6 kg  12/11/19 54.4 kg     Intake/Output Summary (Last 24 hours) at 05/22/2020 1232 Last data filed at 05/21/2020 2120 Gross per 24 hour  Intake 360 ml  Output 750 ml  Net -390 ml    Physical Exam:     Awake Alert, Oriented X 3, Normal affect Right arm strength less than left arm strength, still able to move against gravity and some resistance, right lower extremity 4 out of 5, tremors with intention bilaterally [place].AT, Normal respiratory effort on room air, CTAB RRR,No Gallops,Rubs or new Murmurs,  +ve B.Sounds, Abd Soft, No tenderness, No rebound, guarding or rigidity. No Cyanosis, No new Rash or bruise     I have personally reviewed the following:   Data  Reviewed:  CBC Recent Labs  Lab 05/19/20 2000 05/22/20 0343  WBC 9.1 21.3*  HGB 15.3 13.7  HCT 45.5 40.2  PLT 262 254  MCV 89.7 90.1  MCH 30.2 30.7  MCHC 33.6 34.1  RDW 13.4 13.8  LYMPHSABS  --  0.7  MONOABS  --  1.1*  EOSABS  --  0.0  BASOSABS  --  0.0    Chemistries  Recent Labs  Lab 05/19/20 2000 05/22/20 0343  NA 139 140  K 3.6 4.0  CL 107 108  CO2 21* 22  GLUCOSE 111* 145*  BUN 14 17  CREATININE 0.79 0.92  CALCIUM 9.4 9.7   ------------------------------------------------------------------------------------------------------------------ No results for input(s): CHOL, HDL, LDLCALC, TRIG, CHOLHDL, LDLDIRECT in the last 72 hours.  Lab Results  Component Value Date   HGBA1C 5.3 12/14/2019   ------------------------------------------------------------------------------------------------------------------ No results for input(s): TSH, T4TOTAL, T3FREE, THYROIDAB in the last 72 hours.  Invalid input(s): FREET3 ------------------------------------------------------------------------------------------------------------------ No results for input(s): VITAMINB12, FOLATE, FERRITIN, TIBC, IRON, RETICCTPCT in the last 72 hours.  Coagulation profile No results for input(s): INR, PROTIME in the last 168 hours.  No results for input(s): DDIMER in the last 72 hours.  Cardiac Enzymes No results for input(s): CKMB, TROPONINI, MYOGLOBIN in the last 168 hours.  Invalid input(s): CK ------------------------------------------------------------------------------------------------------------------ No results found for: BNP  Micro Results Recent Results (from the past 240 hour(s))  SARS Coronavirus 2 by RT PCR (hospital order, performed in [facility] hospital lab) Nasopharyngeal Nasopharyngeal Swab     Status: None   Collection Time: 05/20/20  1:20 PM   Specimen: Nasopharyngeal Swab  Result Value Ref Range Status   SARS Coronavirus 2 NEGATIVE NEGATIVE Final    Comment:  (NOTE) SARS-CoV-2 target nucleic acids are NOT DETECTED.  The SARS-CoV-2 RNA is generally detectable in upper and lower respiratory specimens during the acute phase of infection. The lowest concentration of SARS-CoV-2 viral copies this assay can detect is 250 copies / mL. A negative result does not preclude SARS-CoV-2 infection and should not be used as the sole basis for treatment or other patient management decisions.  A negative result may occur with improper specimen collection / handling, submission of specimen other than nasopharyngeal swab, presence of viral mutation(s) within the areas targeted by this assay, and inadequate number of viral copies (<250 copies / mL). A negative result must be combined with clinical observations, patient history, and epidemiological information.  Fact Sheet for Patients:   BoilerBrush.com.cy  Fact Sheet for Healthcare Providers: https://pope.com/  This test is not yet approved or  cleared by the [place] FDA and has been authorized for detection and/or diagnosis of SARS-CoV-2 by FDA under an Emergency Use Authorization (EUA).  This EUA will remain in effect (meaning this test can be used) for the duration of the COVID-19 declaration under Section 564(b)(1) of the Act, 21 U.S.C. section 360bbb-3(b)(1), unless the authorization is terminated or revoked sooner.  Performed  at [facility], [address] [place], [place] [address]     Radiology Reports MR Brain W and Wo Contrast  Result Date: 05/19/2020 CLINICAL DATA:  Multiple sclerosis follow-up. Increased extremity weakness and tremors. EXAM: MRI HEAD WITHOUT AND WITH CONTRAST TECHNIQUE: Multiplanar, multiecho pulse sequences of the brain and surrounding structures were obtained without and with intravenous contrast. CONTRAST:  79mL GADAVIST GADOBUTROL 1 MMOL/ML IV SOLN COMPARISON:  12/16/2019 FINDINGS: Brain: Numerous bilateral white  matter lesions in a pattern consistent with multiple sclerosis. Many of the lesions have decreased in size. Most of the lesions show corresponding low T1-weighted signal. There are multiple contrast-enhancing lesions, including 4 cerebellar lesions, 2 of which are clustered in the left middle cerebellar peduncle. The largest supratentorial lesion is located in the right frontal white matter. In total, there are approximately 20 contrast-enhancing lesions. Vascular: Normal flow voids. Skull and upper cervical spine: There are multiple lesions noted at the cervicomedullary junction. Sinuses/Orbits: Negative. Other: None. IMPRESSION: 1. Severe white matter disease in a pattern consistent with multiple sclerosis. 2. Numerous (approximately 20) active demyelinating lesions throughout the supra- and infratentorial brain. Electronically Signed   By: Deatra Robinson M.D.   On: 05/19/2020 21:23   DG Chest Port 1 View  Result Date: 05/20/2020 CLINICAL DATA:  Cough. EXAM: PORTABLE CHEST 1 VIEW COMPARISON:  12/12/2019. FINDINGS: Mediastinum and hilar structures normal. Lungs are clear. Tiny bilateral pleural effusions cannot be excluded. Heart size normal. No acute bony abnormality identified. IMPRESSION: Tiny bilateral pleural effusions cannot be excluded. No focal infiltrate identified. Electronically Signed   By: Maisie Fus  Register   On: 05/20/2020 13:44     Time Spent in minutes  30     Laverna Peace M.D on 05/22/2020 at 12:32 PM  To page go to [email or web address] - password [facility]

## 2020-05-22 NOTE — Progress Notes (Signed)
He reports that he does feel that he is improving.  He still has significant ataxia bilaterally, but feels like his legs especially have been improving.  He is tolerating the steroids well.  Continue Solu-Medrol for total of 5 days.  Ritta Slot, MD [facility] [phone]  If 7pm- 7am, please page neurology on call as listed in AMION.

## 2020-05-22 NOTE — Plan of Care (Signed)
  Problem: Education: Goal: Knowledge of General Education information will improve Description Including pain rating scale, medication(s)/side effects and non-pharmacologic comfort measures Outcome: Progressing   

## 2020-05-23 DIAGNOSIS — D72829 Elevated white blood cell count, unspecified: Secondary | ICD-10-CM

## 2020-05-23 DIAGNOSIS — F32A Depression, unspecified: Secondary | ICD-10-CM

## 2020-05-23 DIAGNOSIS — F329 Major depressive disorder, single episode, unspecified: Secondary | ICD-10-CM

## 2020-05-23 DIAGNOSIS — G35 Multiple sclerosis: Principal | ICD-10-CM

## 2020-05-23 NOTE — Progress Notes (Signed)
Physical Therapy Treatment Patient Details Name: Jaime [name] MRN: [id number] DOB: [birth year] Today's Date: 05/23/2020    History of Present Illness Jaime [name] is a 24 y.o. male with medical history significant of autism, depression, POTS, and multiple sclerosis diagnosed 12/2019 presents with worsening weakness over the last 2-3 weeks.  Notes that he has been having difficulty walking and had been dragging his right leg.  Previously able to walk unassisted, but was now requiring a walker.  Noted associated symptoms of difficulty talking/stuttering, difficulty swallowing food, blurry vision out of the right eye, intermittent urinary/stool incontinence, and blackouts.    PT Comments    Pt progressing steadily towards their physical therapy goals, demonstrating improved activity tolerance and ambulation distance. Requiring min guard assist for transfers, ambulating 100 ft x 2 with up to moderate assist and close chair follow. Utilized rest breaks to work on [employer]. Pt continues with decreased gross motor coordination, balance deficits, weakness, and decreased endurance. Presents as a high fall risk based on above deficits and decreased gait speed. Remains highly appropriate for CIR based on PLOF, age and motivation.     Follow Up Recommendations  CIR;Supervision/Assistance - 24 hour     Equipment Recommendations  Rolling walker with 5" wheels    Recommendations for Other Services       Precautions / Restrictions Precautions Precautions: Fall Restrictions Weight Bearing Restrictions: No    Mobility  Bed Mobility Overal bed mobility: Needs Assistance Bed Mobility: Supine to Sit     Supine to sit: Supervision     General bed mobility comments: Pt self elevating bed height, progressing to edge of bed with supervision  Transfers Overall transfer level: Needs assistance Equipment used: Rolling walker (2 wheeled) Transfers: Sit to/from Stand Sit to Stand: Min  guard         General transfer comment: Min guard to rise and steady multiple times from edge of bed, toilet, and recliner  Ambulation/Gait Ambulation/Gait assistance: Min assist;Mod assist Gait Distance (Feet): 100 Feet (100", 100") Assistive device: Rolling walker (2 wheeled) Gait Pattern/deviations: Step-through pattern;Decreased stride length;Scissoring;Narrow base of support Gait velocity: reduced Gait velocity interpretation: <1.8 ft/sec, indicate of risk for recurrent falls General Gait Details: Pt ambulating 100 ft then 100 ft with a seated rest break, min-modA for stability. Cues for wider BOS, walker proximity, activity pacing, equal step lengths.    Stairs             Wheelchair Mobility    Modified Rankin (Stroke Patients Only)       Balance Overall balance assessment: Needs assistance Sitting-balance support: Feet supported Sitting balance-Leahy Scale: Good Sitting balance - Comments: Able to pull up sock on edge of bed   Standing balance support: Bilateral upper extremity supported;During functional activity Standing balance-Leahy Scale: Poor Standing balance comment: reliant on external support with RW for mobility; and BUE support on sink when standing to brush teeth                            Cognition Arousal/Alertness: Awake/alert Behavior During Therapy: Flat affect Overall Cognitive Status: History of cognitive impairments - at baseline                                 General Comments: Pt with delayed processing/response time, flat affect      Exercises General Exercises - Lower Extremity Long Arc  Quad: Both;10 reps;Seated Hip Flexion/Marching: Both;10 reps;Seated Heel Raises: Both;10 reps;Seated    General Comments        Pertinent Vitals/Pain Pain Assessment: Faces Faces Pain Scale: No hurt    Home Living                      Prior Function            PT Goals (current goals can now be  found in the care plan section) Acute Rehab PT Goals Patient Stated Goal: "work on balance" Potential to Achieve Goals: Good Progress towards PT goals: Progressing toward goals    Frequency    Min 4X/week      PT Plan Frequency needs to be updated    Co-evaluation              AM-PAC PT "6 Clicks" Mobility   Outcome Measure  Help needed turning from your back to your side while in a flat bed without using bedrails?: None Help needed moving from lying on your back to sitting on the side of a flat bed without using bedrails?: None Help needed moving to and from a bed to a chair (including a wheelchair)?: A Little Help needed standing up from a chair using your arms (e.g., wheelchair or bedside chair)?: A Little Help needed to walk in hospital room?: A Lot Help needed climbing 3-5 steps with a railing? : A Lot 6 Click Score: 18    End of Session Equipment Utilized During Treatment: Gait belt Activity Tolerance: Patient tolerated treatment well Patient left: in chair;with call bell/phone within reach;with chair alarm set Nurse Communication: Mobility status PT Visit Diagnosis: Unsteadiness on feet (R26.81);Other abnormalities of gait and mobility (R26.89);Muscle weakness (generalized) (M62.81);History of falling (Z91.81);Other symptoms and signs involving the nervous system (V61.607)     Time: 3710-6269 PT Time Calculation (min) (ACUTE ONLY): 29 min  Charges:  $Gait Training: 8-22 mins $Therapeutic Activity: 8-22 mins                       Lillia Pauls, PT, DPT Acute Rehabilitation Services Pager [phone] Office [phone]    Jaime [name] 05/23/2020, 10:59 AM

## 2020-05-23 NOTE — Progress Notes (Addendum)
[facility]  PROGRESS NOTE  MCCRAE SPECIALE DGL:[id number] DOB: 08/21/1996 DOA: 05/19/2020 PCP: Patient, No Pcp Per Admit date - 05/19/2020   Admitting Physician Clydie Braun, MD  Outpatient Primary MD for the patient is Patient, No Pcp Per  LOS - 3 Brief Narrative   Jaime [name] is a 24 y.o. year old male with medical history significant for multiple sclerosis recently diagnosed in March 2021 during hospitalization not on any chronic immunomodulating therapy who presented with worsening weakness, gait abnormality, difficulty getting out of tub, dragging right leg, episodes of urine incontinence, choking on food and was found to have active demyelinating lesions on MRI in the ED concerning for severe multiple sclerosis exacerbation throughout the supra and infratentorial brain.  Neurology was consulted and recommended pulse Solu-Medrol dose therapy started on 8/17.    Subjective  Today he sitting in bedside chair, continues to note improvement in strength particularly in right lower leg  A & P  Recently diagnosed MS with multiple active demyelinating lesions found on MRI in the setting of worsening gait disturbances, weakness, urinary incontinence, dysphagia, consistent with acute exacerbation of MS, now improving -Neurology recommends continuing pulse dose IV Solu-Medrol for total 5 days, today is day 4 -Prophylactic Protonix -Continue PT while in hospital--recommending CIR versus 24-hour supervision depending on how he progresses with physical therapy in the hospital -Neurology arranging close outpatient follow-up with Dr. 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 Worm on 8/20 Code Status : Full  Disposition Plan  :  Patient is from home. Anticipated d/c date:  Greater  than 3 days. Barriers to d/c or necessity for inpatient status:  Needs completion of pulse steroids for acute MS flare, PT currently recommending CIR to address current deficits and maximize functional independence, will consult CIR for their assessment Consults  : Neurology  Procedures  : None DVT Prophylaxis  :  Lovenox   Lab Results  Component Value Date   PLT 254 05/22/2020    Diet :  Diet Order            DIET SOFT Room service appropriate? Yes; Fluid consistency: Thin  Diet effective now                  Inpatient Medications Scheduled Meds: . enoxaparin (LOVENOX) injection  40 mg Subcutaneous Q24H  . midodrine  5 mg Oral TID WC  . pantoprazole (PROTONIX) IV  40 mg Intravenous Q24H   Continuous Infusions: . methylPREDNISolone (SOLU-MEDROL) injection 1,000 mg (05/23/20 1045)   PRN Meds:.acetaminophen **OR** acetaminophen  Antibiotics  :   Anti-infectives (From admission, onward)   None       Objective   Vitals:   05/22/20 0436 05/22/20 2050 05/23/20 0545 05/23/20 1340  BP: 100/62 110/61 114/71 103/74  Pulse: 88 80 (!) 50 65  Resp: 18 18 16 16   Temp: 98 F (36.7 C) 98 F (36.7 C) 97.9 F (36.6 C) 98.3 F (36.8 C)  TempSrc: Oral Oral Oral Oral  SpO2: 98% 100% 99% 100%  Weight:      Height:        SpO2: 100 %  Wt Readings from Last 3 Encounters:  05/20/20 60 kg  01/09/20 60.6 kg  12/11/19 54.4 kg     Intake/Output Summary (Last 24 hours) at 05/23/2020 1512  Last data filed at 05/23/2020 0400 Gross per 24 hour  Intake --  Output 300 ml  Net -300 ml    Physical Exam:     Awake Alert, Oriented X 3, Normal affect Right arm strength less than left arm strength, still able to move against gravity and some resistance, right lower extremity 4 out of 5, tremors with intention bilaterally significantly improved Jaime [name].AT, Normal respiratory effort on room air, CTAB RRR,No Gallops,Rubs or new Murmurs,  +ve B.Sounds, Abd Soft, No tenderness, No  rebound, guarding or rigidity. No Cyanosis, No new Rash or bruise     I have personally reviewed the following:   Data Reviewed:  CBC Recent Labs  Lab 05/19/20 2000 05/22/20 0343  WBC 9.1 21.3*  HGB 15.3 13.7  HCT 45.5 40.2  PLT 262 254  MCV 89.7 90.1  MCH 30.2 30.7  MCHC 33.6 34.1  RDW 13.4 13.8  LYMPHSABS  --  0.7  MONOABS  --  1.1*  EOSABS  --  0.0  BASOSABS  --  0.0    Chemistries  Recent Labs  Lab 05/19/20 2000 05/22/20 0343  NA 139 140  K 3.6 4.0  CL 107 108  CO2 21* 22  GLUCOSE 111* 145*  BUN 14 17  CREATININE 0.79 0.92  CALCIUM 9.4 9.7   ------------------------------------------------------------------------------------------------------------------ No results for input(s): CHOL, HDL, LDLCALC, TRIG, CHOLHDL, LDLDIRECT in the last 72 hours.  Lab Results  Component Value Date   HGBA1C 5.3 12/14/2019   ------------------------------------------------------------------------------------------------------------------ No results for input(s): TSH, T4TOTAL, T3FREE, THYROIDAB in the last 72 hours.  Invalid input(s): FREET3 ------------------------------------------------------------------------------------------------------------------ No results for input(s): VITAMINB12, FOLATE, FERRITIN, TIBC, IRON, RETICCTPCT in the last 72 hours.  Coagulation profile No results for input(s): INR, PROTIME in the last 168 hours.  No results for input(s): DDIMER in the last 72 hours.  Cardiac Enzymes No results for input(s): CKMB, TROPONINI, MYOGLOBIN in the last 168 hours.  Invalid input(s): CK ------------------------------------------------------------------------------------------------------------------ No results found for: BNP  Micro Results Recent Results (from the past 240 hour(s))  SARS Coronavirus 2 by RT PCR (hospital order, performed in [facility] hospital lab) Nasopharyngeal Nasopharyngeal Swab     Status: None   Collection Time: 05/20/20  1:20 PM    Specimen: Nasopharyngeal Swab  Result Value Ref Range Status   SARS Coronavirus 2 NEGATIVE NEGATIVE Final    Comment: (NOTE) SARS-CoV-2 target nucleic acids are NOT DETECTED.  The SARS-CoV-2 RNA is generally detectable in upper and lower respiratory specimens during the acute phase of infection. The lowest concentration of SARS-CoV-2 viral copies this assay can detect is 250 copies / mL. A negative result does not preclude SARS-CoV-2 infection and should not be used as the sole basis for treatment or other patient management decisions.  A negative result may occur with improper specimen collection / handling, submission of specimen other than nasopharyngeal swab, presence of viral mutation(s) within the areas targeted by this assay, and inadequate number of viral copies (<250 copies / mL). A negative result must be combined with clinical observations, patient history, and epidemiological information.  Fact Sheet for Patients:   BoilerBrush.com.cy  Fact Sheet for Healthcare Providers: https://pope.com/  This test is not yet approved or  cleared by the [place] FDA and has been authorized for detection and/or diagnosis of SARS-CoV-2 by FDA under an Emergency Use Authorization (EUA).  This EUA will remain in effect (meaning this test can be used) for the duration of the COVID-19 declaration under Section 564(b)(1) of  the Act, 21 U.S.C. section 360bbb-3(b)(1), unless the authorization is terminated or revoked sooner.  Performed at [facility], [address] [place], [place] [address]     Radiology Reports MR Brain W and Wo Contrast  Result Date: 05/19/2020 CLINICAL DATA:  Multiple sclerosis follow-up. Increased extremity weakness and tremors. EXAM: MRI HEAD WITHOUT AND WITH CONTRAST TECHNIQUE: Multiplanar, multiecho pulse sequences of the brain and surrounding structures were obtained without and with intravenous  contrast. CONTRAST:  36mL GADAVIST GADOBUTROL 1 MMOL/ML IV SOLN COMPARISON:  12/16/2019 FINDINGS: Brain: Numerous bilateral white matter lesions in a pattern consistent with multiple sclerosis. Many of the lesions have decreased in size. Most of the lesions show corresponding low T1-weighted signal. There are multiple contrast-enhancing lesions, including 4 cerebellar lesions, 2 of which are clustered in the left middle cerebellar peduncle. The largest supratentorial lesion is located in the right frontal white matter. In total, there are approximately 20 contrast-enhancing lesions. Vascular: Normal flow voids. Skull and upper cervical spine: There are multiple lesions noted at the cervicomedullary junction. Sinuses/Orbits: Negative. Other: None. IMPRESSION: 1. Severe white matter disease in a pattern consistent with multiple sclerosis. 2. Numerous (approximately 20) active demyelinating lesions throughout the supra- and infratentorial brain. Electronically Signed   By: Deatra Robinson M.D.   On: 05/19/2020 21:23   DG Chest Port 1 View  Result Date: 05/20/2020 CLINICAL DATA:  Cough. EXAM: PORTABLE CHEST 1 VIEW COMPARISON:  12/12/2019. FINDINGS: Mediastinum and hilar structures normal. Lungs are clear. Tiny bilateral pleural effusions cannot be excluded. Heart size normal. No acute bony abnormality identified. IMPRESSION: Tiny bilateral pleural effusions cannot be excluded. No focal infiltrate identified. Electronically Signed   By: Maisie Fus  Register   On: 05/20/2020 13:44     Time Spent in minutes  30     Laverna Peace M.D on 05/23/2020 at 3:12 PM  To page go to [email or web address] - password [facility]

## 2020-05-23 NOTE — Progress Notes (Signed)
Subjective: Feels he continues to improve  Exam: Vitals:   05/22/20 2050 05/23/20 0545  BP: 110/61 114/71  Pulse: 80 (!) 50  Resp: 18 16  Temp: 98 F (36.7 C) 97.9 F (36.6 C)  SpO2: 100% 99%   Gen: In bed, NAD Resp: non-labored breathing, no acute distress Abd: soft, nt  Neuro: MS: Awake, alert, interactive and appropriate CN:?  Mild left decreased nasolabial fold, mild nystagmus on end gaze bilaterally Motor: He has 4/5 strength in the left arm and leg capitalizing it  Sensory: Intact light touch and temperature  Impression: 24 year old male with severe MS with acute exacerbation.  He is undergoing IV Solu-Medrol therapy and will need an outpatient visit for disease modifying therapy.  Recommendations: 1) Solu-Medrol, today is day 4/5. 2) PT, OT, CIR if needed.  Ritta Slot, MD [facility] [phone]  If 7pm- 7am, please page neurology on call as listed in AMION.

## 2020-05-23 NOTE — H&P (Signed)
Physical Medicine and Rehabilitation Admission H&P    Chief Complaint  Patient presents with  . Weakness  : HPI: Jaime [name] is a 24 year old right-handed male with history of documented mild autism, hypotension and recent diagnosis of multiple sclerosis diagnosed March 2021 followed by Dr.Yan as well as Dr. Epimenio Foot not on any immune modulating medications at this time.  Well-known to rehab services from MS exacerbation 12/17/2019 to 12/26/2019.  Per chart review lives in a two-level home bed and bath upstairs 2 steps to entry.  Needed assistance for tub transfers recently.  Lives with his mother and 64 and 66 year old siblings.  Presented 05/19/2020 with increasing gait abnormality and needing more help with ADLs dragging his right leg and pocketing of his food as well as bouts of urinary incontinence.  MRI of the brain showed severe white matter disease in a pattern consistent with multiple sclerosis numerous active demyelinating lesions throughout the supra and infratentorial brain.  Admission chemistries unremarkable except glucose 111, SARS current virus negative.  Neurology follow-up placed on IV Solu-Medrol x5 doses initiated 05/20/2020.  Subcutaneous Lovenox for DVT prophylaxis.  Tolerating a regular diet.  Therapy evaluations completed and patient was admitted for a comprehensive rehab program  Review of Systems  Constitutional: Negative for chills and fever.  HENT: Negative for hearing loss.   Eyes: Negative for blurred vision and double vision.  Respiratory: Negative for cough and shortness of breath.   Cardiovascular: Negative for chest pain, palpitations and leg swelling.  Gastrointestinal: Negative for abdominal pain, heartburn, nausea and vomiting.  Genitourinary: Negative for dysuria and hematuria.       Bouts of urinary incontinence  Musculoskeletal: Positive for joint pain and myalgias.  Skin: Negative for rash.  Neurological: Positive for sensory change and weakness.    All other systems reviewed and are negative.  Past Medical History:  Diagnosis Date  . Autism   . Depression   . MS (multiple sclerosis) (HCC)    History reviewed. No pertinent surgical history. Family History  Problem Relation Age of Onset  . Clotting disorder Mother    Social History:  reports that he has never smoked. He has never used smokeless tobacco. He reports that he does not drink alcohol and does not use drugs. Allergies: No Known Allergies Medications Prior to Admission  Medication Sig Dispense Refill  . midodrine (PROAMATINE) 5 MG tablet Take 1 tablet (5 mg total) by mouth 3 (three) times daily with meals. 90 tablet 11  . pantoprazole (PROTONIX) 40 MG tablet Take 1 tablet (40 mg total) by mouth daily. 30 tablet 0  . acetaminophen (TYLENOL) 325 MG tablet Take 2 tablets (650 mg total) by mouth every 6 (six) hours as needed for mild pain (or Fever >/= 101). (Patient not taking: Reported on 05/20/2020)      Drug Regimen Review Drug regimen was reviewed and remains appropriate with no significant issues identified  Home: Home Living Family/patient expects to be discharged to:: Private residence Living Arrangements: Parent, Other relatives Available Help at Discharge: Family (mom, 72 and 9 y.o.sisters) Type of Home: Apartment Home Access: Stairs to enter [employer] of Steps: 2 Entrance Stairs-Rails: Right Home Layout: Two level, Bed/bath upstairs Alternate Level Stairs-Number of Steps: full flight Alternate Level Stairs-Rails: Right Bathroom Shower/Tub: Engineer, manufacturing systems: Standard Bathroom Accessibility: Yes Home Equipment: None Additional Comments: per pt report, lives in a two story apartment; reports recent falls at home. May need to clarify home setup details with family as  conflicting reports given from chart reviews   Functional History: Prior Function Level of Independence: Needs assistance Gait / Transfers Assistance Needed:  reports 'furniture walking' around home ADL's / Homemaking Assistance Needed: pt reports needing assistance for tub transfers recently, reports a recent fall in the bathroom Comments: enjoys playing guitar, playing zelda, used to volunteer at [employer]  Functional Status:  Mobility: Bed Mobility Overal bed mobility: Needs Assistance Bed Mobility: Supine to Sit Supine to sit: Supervision General bed mobility comments: increased time and close guard for safety due to ataxic movement patterns Transfers Overall transfer level: Needs assistance Equipment used: Rolling walker (2 wheeled) Transfers: Sit to/from Stand Sit to Stand: Min assist General transfer comment: MinA to rise and steady from edge of bed and toilet Ambulation/Gait Ambulation/Gait assistance: Min assist, Mod assist Gait Distance (Feet): 65 Feet (15", 10", 40") Assistive device: Rolling walker (2 wheeled) Gait Pattern/deviations: Step-through pattern, Decreased stride length, Scissoring, Narrow base of support General Gait Details: Pt ambulating 15 ft to bathroom and back, 10 ft to hallway, and then 40 ft in hallway, requiring seated rest breaks and chair follow. Min-modA for stability, pt sometimes impulsive and attempting to sit before chair is behind him. Cues for wider BOS (pt tending to scissor at some points) and walker proximity. Decreased gross motor coordination Gait velocity: reduced Gait velocity interpretation: <1.8 ft/sec, indicate of risk for recurrent falls    ADL: ADL Overall ADL's : Needs assistance/impaired Eating/Feeding: Set up, Sitting Grooming: Minimal assistance, Standing Grooming Details (indicate cue type and reason): standing at sink to complete oral care- occasional min A needed to maintain balance. Difficulty with in hand manipulation noted during grooming Upper Body Bathing: Minimal assistance, Sitting Lower Body Bathing: Moderate assistance, Sit to/from stand, Sitting/lateral leans Upper Body  Dressing : Minimal assistance, Sitting Lower Body Dressing: Moderate assistance, Sit to/from stand, Sitting/lateral leans Toilet Transfer: Minimal assistance, Ambulation, RW, Regular Toilet Toilet Transfer Details (indicate cue type and reason): assist to maintain balance and safety Toileting- Clothing Manipulation and Hygiene: Minimal assistance, Sit to/from stand Toileting - Clothing Manipulation Details (indicate cue type and reason): has been having incontinence episodes Tub/ Shower Transfer: Moderate assistance, Rolling walker Tub/Shower Transfer Details (indicate cue type and reason): has had falls in bathroom Functional mobility during ADLs: Minimal assistance, Rolling walker, Cueing for safety  Cognition: Cognition Overall Cognitive Status: History of cognitive impairments - at baseline Orientation Level: Oriented X4 Cognition Arousal/Alertness: Awake/alert Behavior During Therapy: Flat affect Overall Cognitive Status: History of cognitive impairments - at baseline General Comments: Pt with delayed processing/response time, flat affect  Physical Exam: Blood pressure 114/71, pulse (!) 50, temperature 97.9 F (36.6 C), temperature source Oral, resp. rate 16, height 5\' 9"  (1.753 m), weight 60 kg, SpO2 99 %. Physical Exam Constitutional:      General: He is not in acute distress. HENT:     Head: Normocephalic.     Right Ear: External ear normal.     Left Ear: External ear normal.     Nose: Nose normal.  Eyes:     Pupils: Pupils are equal, round, and reactive to light.  Cardiovascular:     Rate and Rhythm: Normal rate and regular rhythm.     Heart sounds: No murmur heard.  No gallop.   Pulmonary:     Effort: Pulmonary effort is normal. No respiratory distress.     Breath sounds: Normal breath sounds. No stridor. No wheezing.  Abdominal:     General: Abdomen is flat. Bowel  sounds are normal. There is no distension.     Palpations: Abdomen is soft. There is no mass.    Musculoskeletal:        General: No swelling or tenderness. Normal range of motion.     Cervical back: Normal range of motion and neck supple.  Skin:    General: Skin is warm and dry.  Neurological:     Mental Status: He is alert.     Comments: Patient is alert makes good . eye contact with examiner.  Cooperative with exam.  Follows commands.  Provides his name age and date of birth. Reasonable insight and awareness. Halting speech. Language normal. Good insight and awareness. Strength 5/5 in all 4's. Sensation intact to LT/pain in all 4's. Decreased FTN, HTS. All 4 limbs ataxic.   Psychiatric:     Comments: Cooperative. Flat affect.      Results for orders placed or performed during the hospital encounter of 05/19/20 (from the past 48 hour(s))  Basic metabolic panel     Status: Abnormal   Collection Time: 05/22/20  3:43 AM  Result Value Ref Range   Sodium 140 135 - 145 mmol/L   Potassium 4.0 3.5 - 5.1 mmol/L   Chloride 108 98 - 111 mmol/L   CO2 22 22 - 32 mmol/L   Glucose, Bld 145 (H) 70 - 99 mg/dL    Comment: Glucose reference range applies only to samples taken after fasting for at least 8 hours.   BUN 17 6 - 20 mg/dL   Creatinine, Ser 3.84 0.61 - 1.24 mg/dL   Calcium 9.7 8.9 - 53.6 mg/dL   GFR calc non Af Amer >60 >60 mL/min   GFR calc Af Amer >60 >60 mL/min   Anion gap 10 5 - 15    Comment: Performed at [facility], [address] [place], [place] [address]  CBC with Differential/Platelet     Status: Abnormal   Collection Time: 05/22/20  3:43 AM  Result Value Ref Range   WBC 21.3 (H) 4.0 - 10.5 K/uL   RBC 4.46 4.22 - 5.81 MIL/uL   Hemoglobin 13.7 13.0 - 17.0 g/dL   HCT 21.2 39 - 52 %   MCV 90.1 80.0 - 100.0 fL   MCH 30.7 26.0 - 34.0 pg   MCHC 34.1 30.0 - 36.0 g/dL   RDW 24.8 25.0 - 03.7 %   Platelets 254 150 - 400 K/uL   nRBC 0.0 0.0 - 0.2 %   Neutrophils Relative % 91 %   Neutro Abs 19.4 (H) 1.7 - 7.7 K/uL   Lymphocytes Relative 3 %   Lymphs Abs 0.7 0.7 -  4.0 K/uL   Monocytes Relative 5 %   Monocytes Absolute 1.1 (H) 0 - 1 K/uL   Eosinophils Relative 0 %   Eosinophils Absolute 0.0 0 - 0 K/uL   Basophils Relative 0 %   Basophils Absolute 0.0 0 - 0 K/uL   Immature Granulocytes 1 %   Abs Immature Granulocytes 0.10 (H) 0.00 - 0.07 K/uL    Comment: Performed at [facility], [address], [place], [place] [address]   No results found.     Medical Problem List and Plan: 1.  Gait abnormality with ataxia secondary to MS exacerbation.  IV Solu-Medrol completed  -patient may shower  -ELOS/Goals: 5-7 days, mod I 2.  Antithrombotics: -DVT/anticoagulation: Lovenox  -antiplatelet therapy: N/A 3. Pain Management: Tylenol as needed 4. Mood: Provide emotional support  -antipsychotic agents:  N/A 5. Neuropsych: This patient is capable of making decisions on his own behalf. 6. Skin/Wound Care: Routine skin checks 7. Fluids/Electrolytes/Nutrition: Routine in and outs with follow-up chemistries on Monday.  8.  High functioning autism.  Stable at baseline 9.  Dysautonomia orthostatic hypotension syndrome.  ProAmatine 5 mg 3 times daily  -monitor bp's with activity     Charlton Amor, PA-C 05/23/2020

## 2020-05-23 NOTE — Progress Notes (Addendum)
Inpatient Rehabilitation Admissions Coordinator  I met at bedside with patient for rehab assessment and then spoke with his mom by phone. We discussed goals and expectations of a possible Cir admit. He was previously at [facility] 3/21 for 9 days before d.c home with outpatient therapy. We will see how patient progresses functionally with therapy after completion of Solumedrol 5/5 Saturday before determining if Cir admit needed. Mom in agreement. I will have my partner, Clemens Catholic follow up with patient's progress over the weekend. She can be reached at [phone].  Danne Baxter, RN, MSN Rehab Admissions Coordinator [phone] 05/23/2020 2:14 PM

## 2020-05-23 NOTE — Plan of Care (Signed)
  Problem: Education: Goal: Knowledge of General Education information will improve Description Including pain rating scale, medication(s)/side effects and non-pharmacologic comfort measures Outcome: Progressing   

## 2020-05-23 NOTE — Progress Notes (Signed)
Occupational Therapy Treatment Patient Details Name: Jaime [name] MRN: [id number] DOB: 1996/08/28 Today's Date: 05/23/2020    History of present illness KNUT RONDINELLI is a 24 y.o. male with medical history significant of autism, depression, POTS, and multiple sclerosis diagnosed 12/2019 presents with worsening weakness over the last 2-3 weeks.  Notes that he has been having difficulty walking and had been dragging his right leg.  Previously able to walk unassisted, but was now requiring a walker.  Noted associated symptoms of difficulty talking/stuttering, difficulty swallowing food, blurry vision out of the right eye, intermittent urinary/stool incontinence, and blackouts.   OT comments  Pt making steady progress toward OT goals. Session focused on BUE therapeutic activity to prepare for improvement in functional independence of ADLs. Issued pt level one theraband and completed exercises listed below. Pt rated these exercises as difficult. Pt was then given yellow theraputty with small objects hid inside to challenge fine motor deficits. Had pt complete this with both R/L hand. He also engaged in using squeeze ball as well for [facility] strength. Continue to note decreased gross motor coordination in BUEs as well as deficits with in hand manipulation skills. Pt reports he will continue to work on these so he can get back to playing the video games he likes and painting. Continue to recommend CIR as d/c recommendation. Will continue to follow.   Follow Up Recommendations  CIR    Equipment Recommendations  Tub/shower seat    Recommendations for Other Services Rehab consult    Precautions / Restrictions Precautions Precautions: Fall Precaution Comments: hx of POTS Restrictions Weight Bearing Restrictions: No       Mobility Bed Mobility               General bed mobility comments: pt up in chair, finished session in chair  Transfers Overall transfer level: Needs  assistance Equipment used: Rolling walker (2 wheeled) Transfers: Sit to/from Stand Sit to Stand: Min guard         General transfer comment: close guarding for safety    Balance Overall balance assessment: Needs assistance Sitting-balance support: Feet supported Sitting balance-Leahy Scale: Good     Standing balance support: Bilateral upper extremity supported;During functional activity Standing balance-Leahy Scale: Poor                             ADL either performed or assessed with clinical judgement   ADL Overall ADL's : Needs assistance/impaired                                     Functional mobility during ADLs: Minimal assistance;Rolling walker;Cueing for safety General ADL Comments: session focused on BUE strengthening program (gross and fine motor) to prepare for more complex ADL independence     Vision Baseline Vision/History: Wears glasses Wears Glasses: At all times Patient Visual Report: Blurring of vision Additional Comments: pt reports vision as functional this date   Perception     Praxis      Cognition Arousal/Alertness: Awake/alert Behavior During Therapy: WFL for tasks assessed/performed Overall Cognitive Status: History of cognitive impairments - at baseline                                 General Comments: Pt with delayed processing/response time- is at baseline. Follow one step  commands and explanation well        Exercises General Exercises - Upper Extremity Shoulder Flexion: AROM;10 reps;Theraband;Both;Seated Theraband Level (Shoulder Flexion): Level 1 (Yellow) Shoulder ABduction: AROM;Both;10 reps;Seated;Theraband Theraband Level (Shoulder Abduction): Level 1 (Yellow) Shoulder Horizontal ABduction: AROM;Both;10 reps;Seated;Theraband Theraband Level (Shoulder Horizontal Abduction): Level 1 (Yellow) Elbow Flexion: AROM;Both;Theraband;Seated;10 reps Theraband Level (Elbow Flexion): Level 1  (Yellow) Digit Composite Flexion: 15 reps;Squeeze ball Other Exercises Other Exercises: Theraputty: 3 small paperclips placed in putty for pt to locate and pull out/then replace   Shoulder Instructions       General Comments      Pertinent Vitals/ Pain       Pain Assessment: No/denies pain  Home Living     Available Help at Discharge: Family;Available 24 hours/day                                Lives With: Family    Prior Functioning/Environment              Frequency  Min 2X/week        Progress Toward Goals  OT Goals(current goals can now be found in the care plan section)  Progress towards OT goals: Progressing toward goals  Acute Rehab OT Goals Patient Stated Goal: improve hand strength to play video games OT Goal Formulation: With patient Time For Goal Achievement: 06/04/20 Potential to Achieve Goals: Good  Plan Discharge plan remains appropriate    Co-evaluation                 AM-PAC OT "6 Clicks" Daily Activity     Outcome Measure   Help from another person eating meals?: A Little Help from another person taking care of personal grooming?: A Little Help from another person toileting, which includes using toliet, bedpan, or urinal?: A Lot Help from another person bathing (including washing, rinsing, drying)?: A Little Help from another person to put on and taking off regular upper body clothing?: A Little Help from another person to put on and taking off regular lower body clothing?: A Lot 6 Click Score: 16    End of Session Equipment Utilized During Treatment: Gait belt;Rolling walker  OT Visit Diagnosis: Unsteadiness on feet (R26.81);Other abnormalities of gait and mobility (R26.89);Other symptoms and signs involving the nervous system (R29.898);Muscle weakness (generalized) (M62.81);History of falling (Z91.81)   Activity Tolerance Patient tolerated treatment well   Patient Left in chair;with chair alarm set   Nurse  Communication Mobility status        Time: 1531-1600 OT Time Calculation (min): 29 min  Charges: OT General Charges $OT Visit: 1 Visit OT Treatments $Therapeutic Activity: 23-37 mins  Dalphine Handing, MSOT, OTR/L Acute Rehabilitation Services [facility] Office Number: [phone] Pager: [phone]  Dalphine Handing 05/23/2020, 6:12 PM

## 2020-05-23 NOTE — PMR Pre-admission (Signed)
PMR Admission Coordinator Pre-Admission Assessment   Patient: Jaime [name] is an 23 y.o., male MRN: [id number] DOB: 01/31/1996 Height: 5' 9" (175.3 cm) Weight: 60 kg                                                                                                                                                  Insurance Information HMO:     PPO:      PCP:      IPA:      80/20:      OTHER:  PRIMARY: Medicaid [place] [employer]      Policy#: [id number]      Subscriber: pt Benefits:  Phone #: passport one online     Name: 8/20 Eff. Date: active MADCY     100 % per medicaid guidelines   Financial Counselor:       Phone#:    The "Data Collection Information Summary" for patients in Inpatient Rehabilitation Facilities with attached "Privacy Act Statement-Health Care Records" was provided and verbally reviewed with: n/a   Emergency Contact Information         Contact Information     Name Relation Home Work Mobile    Rippetoe,Dawn Mother [phone]   [phone]    Emmanuel, Alex Brother     [phone]       Current Medical History  Patient Admitting Diagnosis: MS exacerbation   History of Present Illness:  Jaime [name] is a 23-year-old right-handed male with history of documented mild autism, hypotension and recent diagnosis of multiple sclerosis diagnosed March 2021 followed by Dr.Yan as well as Dr. Sater not on any immune modulating medications at this time.  Well-known to rehab services from MS exacerbation 12/17/2019 to 12/26/2019.  Presented 05/19/2020 with increasing gait abnormality and needing more help with ADLs dragging his right leg and pocketing of his food as well as bouts of urinary incontinence.  MRI of the brain showed severe white matter disease in a pattern consistent with multiple sclerosis numerous active demyelinating lesions throughout the supra and infratentorial brain.  Admission chemistries unremarkable except glucose 111, SARS current virus negative.  Neurology  follow-up placed on IV Solu-Medrol x5 doses initiated 05/20/2020.  Subcutaneous Lovenox for DVT prophylaxis.  Tolerating a regular diet.     Past Medical History      Past Medical History:  Diagnosis Date  . Autism    . Depression    . MS (multiple sclerosis) (HCC)        Family History  family history includes Clotting disorder in his mother.   Prior Rehab/Hospitalizations:  Has the patient had prior rehab or hospitalizations prior to admission? Yes 12/23/2019 CIR for 9 days with follow up outpatient therapy   Has the patient had major surgery during 100 days prior to admission? No   Current Medications    Current Facility-Administered   Medications:  .  acetaminophen (TYLENOL) tablet 650 mg, 650 mg, Oral, Q6H PRN **OR** acetaminophen (TYLENOL) suppository 650 mg, 650 mg, Rectal, Q6H PRN, Smith, Rondell A, MD .  enoxaparin (LOVENOX) injection 40 mg, 40 mg, Subcutaneous, Q24H, Smith, Rondell A, MD, 40 mg at 05/22/20 2046 .  methylPREDNISolone sodium succinate (SOLU-MEDROL) 1,000 mg in sodium chloride 0.9 % 50 mL IVPB, 1,000 mg, Intravenous, Daily, Smith, David R, PA-C, Last Rate: 58 mL/hr at 05/23/20 1045, 1,000 mg at 05/23/20 1045 .  midodrine (PROAMATINE) tablet 5 mg, 5 mg, Oral, TID WC, Smith, Rondell A, MD, 5 mg at 05/23/20 1252 .  pantoprazole (PROTONIX) injection 40 mg, 40 mg, Intravenous, Q24H, Smith, David R, PA-C, 40 mg at 05/23/20 1450   Patients Current Diet:  Diet Order                      DIET SOFT Room service appropriate? Yes; Fluid consistency: Thin  Diet effective now                      Precautions / Restrictions Precautions Precautions: Fall Precaution Comments: hx of POTS Restrictions Weight Bearing Restrictions: No    Has the patient had 2 or more falls or a fall with injury in the past year?No   Prior Activity Level Limited Community (1-2x/wk): Mod I    Prior Functional Level Prior Function Level of Independence: Needs assistance Gait /  Transfers Assistance Needed: reports 'furniture walking' around home ADL's / Homemaking Assistance Needed: pt reports needing assistance for tub transfers recently, reports a recent fall in the bathroom Comments: enjoys playing guitar, playing zelda, used to volunteer at [employer]   Self Care: Did the patient need help bathing, dressing, using the toilet or eating?  Independent   Indoor Mobility: Did the patient need assistance with walking from room to room (with or without device)? Independent   Stairs: Did the patient need assistance with internal or external stairs (with or without device)? Independent   Functional Cognition: Did the patient need help planning regular tasks such as shopping or remembering to take medications? Independent   Home Assistive Devices / Equipment Home Equipment: None   Prior Device Use: Indicate devices/aids used by the patient prior to current illness, exacerbation or injury? None of the above   Current Functional Level Cognition   Overall Cognitive Status: History of cognitive impairments - at baseline Orientation Level: Oriented X4 General Comments: Pt with delayed processing/response time, flat affect    Extremity Assessment (includes Sensation/Coordination)   Upper Extremity Assessment: Defer to OT evaluation RUE Deficits / Details: decreased coordination; difficulty manipulating toothpaste in session RUE Coordination: decreased fine motor, decreased gross motor LUE Deficits / Details: decreased coordination; difficulty with in hand manipulation LUE Coordination: decreased fine motor, decreased gross motor  Lower Extremity Assessment: RLE deficits/detail, LLE deficits/detail RLE Deficits / Details: grossly 4+/5 RLE Sensation: decreased light touch     ADLs   Overall ADL's : Needs assistance/impaired Eating/Feeding: Set up, Sitting Grooming: Minimal assistance, Standing Grooming Details (indicate cue type and reason): standing at sink to complete  oral care- occasional min A needed to maintain balance. Difficulty with in hand manipulation noted during grooming Upper Body Bathing: Minimal assistance, Sitting Lower Body Bathing: Moderate assistance, Sit to/from stand, Sitting/lateral leans Upper Body Dressing : Minimal assistance, Sitting Lower Body Dressing: Moderate assistance, Sit to/from stand, Sitting/lateral leans Toilet Transfer: Minimal assistance, Ambulation, RW, Regular Toilet Toilet Transfer Details (indicate cue type   and reason): assist to maintain balance and safety Toileting- Clothing Manipulation and Hygiene: Minimal assistance, Sit to/from stand Toileting - Clothing Manipulation Details (indicate cue type and reason): has been having incontinence episodes Tub/ Shower Transfer: Moderate assistance, Rolling walker Tub/Shower Transfer Details (indicate cue type and reason): has had falls in bathroom Functional mobility during ADLs: Minimal assistance, Rolling walker, Cueing for safety     Mobility   Overal bed mobility: Needs Assistance Bed Mobility: Supine to Sit Supine to sit: Supervision General bed mobility comments: Pt self elevating bed height, progressing to edge of bed with supervision     Transfers   Overall transfer level: Needs assistance Equipment used: Rolling walker (2 wheeled) Transfers: Sit to/from Stand Sit to Stand: Min guard General transfer comment: Min guard to rise and steady multiple times from edge of bed, toilet, and recliner     Ambulation / Gait / Stairs / Wheelchair Mobility   Ambulation/Gait Ambulation/Gait assistance: Min assist, Mod assist Gait Distance (Feet): 100 Feet (100", 100") Assistive device: Rolling walker (2 wheeled) Gait Pattern/deviations: Step-through pattern, Decreased stride length, Scissoring, Narrow base of support General Gait Details: Pt ambulating 100 ft then 100 ft with a seated rest break, min-modA for stability. Cues for wider BOS, walker proximity, activity  pacing, equal step lengths.  Gait velocity: reduced Gait velocity interpretation: <1.8 ft/sec, indicate of risk for recurrent falls     Posture / Balance Dynamic Sitting Balance Sitting balance - Comments: Able to pull up sock on edge of bed Balance Overall balance assessment: Needs assistance Sitting-balance support: Feet supported Sitting balance-Leahy Scale: Good Sitting balance - Comments: Able to pull up sock on edge of bed Standing balance support: Bilateral upper extremity supported, During functional activity Standing balance-Leahy Scale: Poor Standing balance comment: reliant on external support with RW for mobility; and BUE support on sink when standing to brush teeth     Special needs/care consideration Designated visitor is Mom., Dawn Autism    Previous Home Environment  Living Arrangements: Parent, Other relatives  Lives With: Family Available Help at Discharge: Family, Available 24 hours/day Type of Home: Apartment Home Layout: Two level, Bed/bath upstairs Alternate Level Stairs-Rails: Right Alternate Level Stairs-Number of Steps: full flight Home Access: Stairs to enter Entrance Stairs-Rails: Right Entrance Stairs-Number of Steps: 2 Bathroom Shower/Tub: Tub/shower unit Bathroom Toilet: Standard Bathroom Accessibility: Yes How Accessible: Accessible via walker Home Care Services: No (was receiving outpateint therapy after CIR admit 3/21) Additional Comments: per pt report, lives in a two story apartment; reports recent falls at home. May need to clarify home setup details with family as conflicting reports given from chart reviews   Discharge Living Setting Plans for Discharge Living Setting: Apartment, Lives with (comment) (parent) Type of Home at Discharge: Apartment Discharge Home Layout: Two level, Bed/bath upstairs Alternate Level Stairs-Rails: Right Alternate Level Stairs-Number of Steps: full flight Discharge Home Access: Stairs to enter Entrance  Stairs-Rails: Right Entrance Stairs-Number of Steps: 2 Discharge Bathroom Shower/Tub: Tub/shower unit Discharge Bathroom Toilet: Standard Discharge Bathroom Accessibility: Yes How Accessible: Accessible via walker Does the patient have any problems obtaining your medications?: No   Social/Family/Support Systems Contact Information: mom, Dawn Anticipated Caregiver: Mom Anticipated Caregiver's Contact Information: see above Ability/Limitations of Caregiver: no limitations Caregiver Availability: 24/7 Discharge Plan Discussed with Primary Caregiver: Yes Is Caregiver In Agreement with Plan?: No Does Caregiver/Family have Issues with Lodging/Transportation while Pt is in Rehab?: No   Goals Patient/Family Goal for Rehab: Mod I to supervision with PT and OT 

## 2020-05-24 ENCOUNTER — Inpatient Hospital Stay (HOSPITAL_COMMUNITY)
Admission: EM | Admit: 2020-05-24 | Discharge: 2020-06-05 | DRG: 059 | Disposition: A | Discharge status: Home Health | Payer: Medicaid Other | Source: Intra-hospital | Attending: Physical Medicine and Rehabilitation | Admitting: Physical Medicine and Rehabilitation

## 2020-05-24 ENCOUNTER — Encounter (HOSPITAL_COMMUNITY): Payer: Self-pay | Admitting: Internal Medicine

## 2020-05-24 ENCOUNTER — Other Ambulatory Visit: Payer: Self-pay

## 2020-05-24 DIAGNOSIS — I951 Orthostatic hypotension: Secondary | ICD-10-CM | POA: Diagnosis present

## 2020-05-24 DIAGNOSIS — E876 Hypokalemia: Secondary | ICD-10-CM | POA: Diagnosis present

## 2020-05-24 DIAGNOSIS — K5901 Slow transit constipation: Secondary | ICD-10-CM

## 2020-05-24 DIAGNOSIS — G4700 Insomnia, unspecified: Secondary | ICD-10-CM | POA: Diagnosis present

## 2020-05-24 DIAGNOSIS — R509 Fever, unspecified: Secondary | ICD-10-CM

## 2020-05-24 DIAGNOSIS — K219 Gastro-esophageal reflux disease without esophagitis: Secondary | ICD-10-CM | POA: Diagnosis present

## 2020-05-24 DIAGNOSIS — N390 Urinary tract infection, site not specified: Secondary | ICD-10-CM | POA: Diagnosis not present

## 2020-05-24 DIAGNOSIS — F840 Autistic disorder: Secondary | ICD-10-CM | POA: Diagnosis present

## 2020-05-24 DIAGNOSIS — Z79899 Other long term (current) drug therapy: Secondary | ICD-10-CM

## 2020-05-24 DIAGNOSIS — G35 Multiple sclerosis: Secondary | ICD-10-CM | POA: Diagnosis present

## 2020-05-24 DIAGNOSIS — Z832 Family history of diseases of the blood and blood-forming organs and certain disorders involving the immune mechanism: Secondary | ICD-10-CM

## 2020-05-24 DIAGNOSIS — I959 Hypotension, unspecified: Secondary | ICD-10-CM | POA: Diagnosis not present

## 2020-05-24 MED ORDER — ACETAMINOPHEN 650 MG RE SUPP: 650.0000 mg | Freq: Four times a day (QID) | RECTAL | Status: DC | PRN

## 2020-05-24 MED ORDER — ENOXAPARIN SODIUM 40 MG/0.4ML ~~LOC~~ SOLN
40.0000 mg | SUBCUTANEOUS | Status: DC
  Administered 2020-05-24 – 2020-05-30 (×7): 40 mg via SUBCUTANEOUS
  Filled 2020-05-24 (×7): qty 0.4

## 2020-05-24 MED ORDER — ENOXAPARIN SODIUM 40 MG/0.4ML ~~LOC~~ SOLN: 40.0000 mg | SUBCUTANEOUS | Status: DC

## 2020-05-24 MED ORDER — MIDODRINE HCL 5 MG PO TABS
5.0000 mg | ORAL_TABLET | Freq: Three times a day (TID) | ORAL | 11 refills | Status: DC
Start: 2020-05-24 — End: 2020-06-05

## 2020-05-24 MED ORDER — PANTOPRAZOLE SODIUM 40 MG PO TBEC
40.0000 mg | DELAYED_RELEASE_TABLET | Freq: Every day | ORAL | 0 refills | Status: DC
Start: 2020-05-24 — End: 2020-06-04

## 2020-05-24 MED ORDER — ACETAMINOPHEN 325 MG PO TABS
650.0000 mg | ORAL_TABLET | Freq: Four times a day (QID) | ORAL | Status: DC | PRN
  Administered 2020-05-24 – 2020-05-31 (×3): 650 mg via ORAL
  Filled 2020-05-24 (×3): qty 2

## 2020-05-24 MED ORDER — MIDODRINE HCL 5 MG PO TABS
5.0000 mg | ORAL_TABLET | Freq: Three times a day (TID) | ORAL | Status: DC
  Administered 2020-05-24 – 2020-05-27 (×9): 5 mg via ORAL
  Filled 2020-05-24 (×8): qty 1

## 2020-05-24 NOTE — Progress Notes (Signed)
Pt arrived to unit, mother Dawn at bedside, Pt is alert and oriented x 4, able to make needs known, no c/o pain, breathe sounds are clear in fields, BS + in all 4 quards, pt and mother oriented to rehab.

## 2020-05-24 NOTE — Progress Notes (Signed)
Physical Therapy Treatment Patient Details Name: Jaime [name] MRN: [id number] DOB: [birth year] Today's Date: 05/24/2020    History of Present Illness Jaime [name] is a 24 y.o. male with medical history significant of autism, depression, POTS, and multiple sclerosis diagnosed 12/2019 presents with worsening weakness over the last 2-3 weeks.  Notes that he has been having difficulty walking and had been dragging his right leg.  Previously able to walk unassisted, but was now requiring a walker.  Noted associated symptoms of difficulty talking/stuttering, difficulty swallowing food, blurry vision out of the right eye, intermittent urinary/stool incontinence, and blackouts.    PT Comments    Pt agreeable to participate with therapy prior to transferring to CIR today. Pt able to progress OOB with slow tremulous movements and ambulated in hallway. Assist required for stability with ambulation and noted scissoring worsened as pt fatigued. After gait training pt reported feeling "energized" and participated in LE strengthening exercises. He seems very motivated and able to tolerate the intensity of CIR. Expected to d/c to CIR today.   Follow Up Recommendations  CIR;Supervision/Assistance - 24 hour     Equipment Recommendations  Rolling walker with 5" wheels    Recommendations for Other Services       Precautions / Restrictions Precautions Precautions: Fall Precaution Comments: hx of POTS    Mobility  Bed Mobility Overal bed mobility: Needs Assistance Bed Mobility: Supine to Sit     Supine to sit: Supervision     General bed mobility comments: supervision for line management only  Transfers Overall transfer level: Needs assistance Equipment used: Rolling walker (2 wheeled) Transfers: Sit to/from Stand Sit to Stand: Min guard         General transfer comment: close guarding for safety  Ambulation/Gait Ambulation/Gait assistance: Min assist Gait Distance (Feet): 125  Feet Assistive device: Rolling walker (2 wheeled) Gait Pattern/deviations: Step-through pattern;Decreased stride length;Scissoring;Narrow base of support Gait velocity: reduced   General Gait Details: Pt able to ambulate with min A for occasional stability. Inconsistanc gait speed and tremulous gait. Scissoring worsening as pt fatigued.    Stairs             Wheelchair Mobility    Modified Rankin (Stroke Patients Only)       Balance Overall balance assessment: Needs assistance Sitting-balance support: Feet supported Sitting balance-Leahy Scale: Good     Standing balance support: Bilateral upper extremity supported;During functional activity Standing balance-Leahy Scale: Poor Standing balance comment: reliant on external support with RW for mobility; and BUE support on sink when standing to brush teeth                            Cognition Arousal/Alertness: Awake/alert Behavior During Therapy: WFL for tasks assessed/performed Overall Cognitive Status: History of cognitive impairments - at baseline                                 General Comments: Pt with delayed processing/response time- is at baseline. Follow one step commands and explanation well      Exercises General Exercises - Lower Extremity Long Arc Quad: Both;10 reps;Seated Mini-Sqauts: Strengthening;10 reps;Standing (With RW for support)    General Comments        Pertinent Vitals/Pain Pain Assessment: No/denies pain    Home Living  Prior Function            PT Goals (current goals can now be found in the care plan section) Acute Rehab PT Goals Patient Stated Goal: improve hand strength to play video games PT Goal Formulation: With patient Time For Goal Achievement: 06/04/20 Potential to Achieve Goals: Good Progress towards PT goals: Progressing toward goals    Frequency    Min 4X/week      PT Plan Frequency needs to be  updated;Current plan remains appropriate    Co-evaluation              AM-PAC PT "6 Clicks" Mobility   Outcome Measure  Help needed turning from your back to your side while in a flat bed without using bedrails?: None Help needed moving from lying on your back to sitting on the side of a flat bed without using bedrails?: None Help needed moving to and from a bed to a chair (including a wheelchair)?: A Little Help needed standing up from a chair using your arms (e.g., wheelchair or bedside chair)?: A Little Help needed to walk in hospital room?: A Little Help needed climbing 3-5 steps with a railing? : A Lot 6 Click Score: 19    End of Session Equipment Utilized During Treatment: Gait belt Activity Tolerance: Patient tolerated treatment well Patient left: in chair;with call bell/phone within reach;with chair alarm set Nurse Communication: Mobility status PT Visit Diagnosis: Unsteadiness on feet (R26.81);Other abnormalities of gait and mobility (R26.89);Muscle weakness (generalized) (M62.81);History of falling (Z91.81);Other symptoms and signs involving the nervous system (R29.898)     Time: 2094-7096 PT Time Calculation (min) (ACUTE ONLY): 22 min  Charges:  $Gait Training: 8-22 mins                     Kallie Locks, [place] Pager [phone] Acute Rehab   Sheral Apley 05/24/2020, 9:49 AM

## 2020-05-24 NOTE — Progress Notes (Signed)
PMR Admission Coordinator Pre-Admission Assessment   Patient: Jaime [name] is an 24 y.o., male MRN: [id number] DOB: 1996-06-10 Height: 5\' 9"  (175.3 cm) Weight: 60 kg                                                                                                                                                  Insurance Information HMO:     PPO:      PCP:      IPA:      80/20:      OTHER:  PRIMARY: Medicaid [place] Access      Policy#: m      Subscriber: pt Benefits:  Phone #: passport one online     Name: 8/20 Eff. Date: active MADCY     100 % per medicaid guidelines   Financial Counselor:       Phone#:    The 9/20 Information Summary" for patients in Inpatient Rehabilitation Facilities with attached "Privacy Act Statement-Health Care Records" was provided and verbally reviewed with: n/a   Emergency Contact Information         Contact Information     Name Relation Home Work Mobile    Jaime [name] Mother [phone]   [phone]    Jaime [name], Jaime [name]     Jaime [name]       Current Medical History  Patient Admitting Diagnosis: MS exacerbation   History of Present Illness:  Jaime [name] is a 24 year old right-handed male with history of documented mild autism, hypotension and recent diagnosis of multiple sclerosis diagnosed March 2021 followed by Dr.Yan as well as Dr. April 2021 not on any immune modulating medications at this time.  Well-known to rehab services from MS exacerbation 12/17/2019 to 12/26/2019.  Presented 05/19/2020 with increasing gait abnormality and needing more help with ADLs dragging his right leg and pocketing of his food as well as bouts of urinary incontinence.  MRI of the brain showed severe white matter disease in a pattern consistent with multiple sclerosis numerous active demyelinating lesions throughout the supra and infratentorial brain.  Admission chemistries unremarkable except glucose 111, SARS current virus negative.  Neurology  follow-up placed on IV Solu-Medrol x5 doses initiated 05/20/2020.  Subcutaneous Lovenox for DVT prophylaxis.  Tolerating a regular diet.     Past Medical History      Past Medical History:  Diagnosis Date  . Autism    . Depression    . MS (multiple sclerosis) (HCC)        Family History  family history includes Clotting disorder in his mother.   Prior Rehab/Hospitalizations:  Has the patient had prior rehab or hospitalizations prior to admission? Yes 12/23/2019 CIR for 9 days with follow up outpatient therapy   Has the patient had major surgery during 100 days prior to admission? No   Current Medications    Current Facility-Administered  Medications:  .  acetaminophen (TYLENOL) tablet 650 mg, 650 mg, Oral, Q6H PRN **OR** acetaminophen (TYLENOL) suppository 650 mg, 650 mg, Rectal, Q6H PRN, Smith, Rondell A, MD .  enoxaparin (LOVENOX) injection 40 mg, 40 mg, Subcutaneous, Q24H, Smith, Rondell A, MD, 40 mg at 05/22/20 2046 .  methylPREDNISolone sodium succinate (SOLU-MEDROL) 1,000 mg in sodium chloride 0.9 % 50 mL IVPB, 1,000 mg, Intravenous, Daily, Ulice Dash, PA-C, Last Rate: 58 mL/hr at 05/23/20 1045, 1,000 mg at 05/23/20 1045 .  midodrine (PROAMATINE) tablet 5 mg, 5 mg, Oral, TID WC, Smith, Rondell A, MD, 5 mg at 05/23/20 1252 .  pantoprazole (PROTONIX) injection 40 mg, 40 mg, Intravenous, Q24H, Ulice Dash, PA-C, 40 mg at 05/23/20 1450   Patients Current Diet:  Diet Order                      DIET SOFT Room service appropriate? Yes; Fluid consistency: Thin  Diet effective now                      Precautions / Restrictions Precautions Precautions: Fall Precaution Comments: hx of POTS Restrictions Weight Bearing Restrictions: No    Has the patient had 2 or more falls or a fall with injury in the past year?No   Prior Activity Level Limited Community (1-2x/wk): Mod I    Prior Functional Level Prior Function Level of Independence: Needs assistance Gait /  Transfers Assistance Needed: reports 'furniture walking' around home ADL's / Homemaking Assistance Needed: pt reports needing assistance for tub transfers recently, reports a recent fall in the bathroom Comments: enjoys playing guitar, playing zelda, used to volunteer at [facility]   Self Care: Did the patient need help bathing, dressing, using the toilet or eating?  Independent   Indoor Mobility: Did the patient need assistance with walking from room to room (with or without device)? Independent   Stairs: Did the patient need assistance with internal or external stairs (with or without device)? Independent   Functional Cognition: Did the patient need help planning regular tasks such as shopping or remembering to take medications? Independent   Home Assistive Devices / Equipment Home Equipment: None   Prior Device Use: Indicate devices/aids used by the patient prior to current illness, exacerbation or injury? None of the above   Current Functional Level Cognition   Overall Cognitive Status: History of cognitive impairments - at baseline Orientation Level: Oriented X4 General Comments: Pt with delayed processing/response time, flat affect    Extremity Assessment (includes Sensation/Coordination)   Upper Extremity Assessment: Defer to OT evaluation RUE Deficits / Details: decreased coordination; difficulty manipulating toothpaste in session RUE Coordination: decreased fine motor, decreased gross motor LUE Deficits / Details: decreased coordination; difficulty with in hand manipulation LUE Coordination: decreased fine motor, decreased gross motor  Lower Extremity Assessment: RLE deficits/detail, LLE deficits/detail RLE Deficits / Details: grossly 4+/5 RLE Sensation: decreased light touch     ADLs   Overall ADL's : Needs assistance/impaired Eating/Feeding: Set up, Sitting Grooming: Minimal assistance, Standing Grooming Details (indicate cue type and reason): standing at sink to complete  oral care- occasional min A needed to maintain balance. Difficulty with in hand manipulation noted during grooming Upper Body Bathing: Minimal assistance, Sitting Lower Body Bathing: Moderate assistance, Sit to/from stand, Sitting/lateral leans Upper Body Dressing : Minimal assistance, Sitting Lower Body Dressing: Moderate assistance, Sit to/from stand, Sitting/lateral leans Toilet Transfer: Minimal assistance, Ambulation, RW, Regular Toilet Toilet Transfer Details (indicate cue type  and reason): assist to maintain balance and safety Toileting- Clothing Manipulation and Hygiene: Minimal assistance, Sit to/from stand Toileting - Clothing Manipulation Details (indicate cue type and reason): has been having incontinence episodes Tub/ Shower Transfer: Moderate assistance, Rolling walker Tub/Shower Transfer Details (indicate cue type and reason): has had falls in bathroom Functional mobility during ADLs: Minimal assistance, Rolling walker, Cueing for safety     Mobility   Overal bed mobility: Needs Assistance Bed Mobility: Supine to Sit Supine to sit: Supervision General bed mobility comments: Pt self elevating bed height, progressing to edge of bed with supervision     Transfers   Overall transfer level: Needs assistance Equipment used: Rolling walker (2 wheeled) Transfers: Sit to/from Stand Sit to Stand: Min guard General transfer comment: Min guard to rise and steady multiple times from edge of bed, toilet, and recliner     Ambulation / Gait / Stairs / Wheelchair Mobility   Ambulation/Gait Ambulation/Gait assistance: Min assist, Mod assist Gait Distance (Feet): 100 Feet (100", 100") Assistive device: Rolling walker (2 wheeled) Gait Pattern/deviations: Step-through pattern, Decreased stride length, Scissoring, Narrow base of support General Gait Details: Pt ambulating 100 ft then 100 ft with a seated rest break, min-modA for stability. Cues for wider BOS, walker proximity, activity  pacing, equal step lengths.  Gait velocity: reduced Gait velocity interpretation: <1.8 ft/sec, indicate of risk for recurrent falls     Posture / Balance Dynamic Sitting Balance Sitting balance - Comments: Able to pull up sock on edge of bed Balance Overall balance assessment: Needs assistance Sitting-balance support: Feet supported Sitting balance-Leahy Scale: Good Sitting balance - Comments: Able to pull up sock on edge of bed Standing balance support: Bilateral upper extremity supported, During functional activity Standing balance-Leahy Scale: Poor Standing balance comment: reliant on external support with RW for mobility; and BUE support on sink when standing to brush teeth     Special needs/care consideration Designated visitor is Mom., Dawn Autism    Previous Home Environment  Living Arrangements: Parent, Other relatives  Lives With: Family Available Help at Discharge: Family, Available 24 hours/day Type of Home: Apartment Home Layout: Two level, Bed/bath upstairs Alternate Level Stairs-Rails: Right Alternate Level Stairs-Number of Steps: full flight Home Access: Stairs to enter Entrance Stairs-Rails: Right Entrance Stairs-Number of Steps: 2 Bathroom Shower/Tub: Engineer, manufacturing systems: Standard Bathroom Accessibility: Yes How Accessible: Accessible via walker Home Care Services: No (was receiving outpateint therapy after CIR admit 3/21) Additional Comments: per pt report, lives in a two story apartment; reports recent falls at home. May need to clarify home setup details with family as conflicting reports given from chart reviews   Discharge Living Setting Plans for Discharge Living Setting: Apartment, Lives with (comment) (parent) Type of Home at Discharge: Apartment Discharge Home Layout: Two level, Bed/bath upstairs Alternate Level Stairs-Rails: Right Alternate Level Stairs-Number of Steps: full flight Discharge Home Access: Stairs to enter Entrance  Stairs-Rails: Right Entrance Stairs-Number of Steps: 2 Discharge Bathroom Shower/Tub: Tub/shower unit Discharge Bathroom Toilet: Standard Discharge Bathroom Accessibility: Yes How Accessible: Accessible via walker Does the patient have any problems obtaining your medications?: No   Social/Family/Support Systems Contact Information: mom, Dawn Anticipated Caregiver: Mom Anticipated Industrial/product designer Information: see above Ability/Limitations of Caregiver: no limitations Caregiver Availability: 24/7 Discharge Plan Discussed with Primary Caregiver: Yes Is Caregiver In Agreement with Plan?: No Does Caregiver/Family have Issues with Lodging/Transportation while Pt is in Rehab?: No   Goals Patient/Family Goal for Rehab: Mod I to supervision with PT and OT  Expected length of stay: ELOS 4 to 7 days Pt/Family Agrees to Admission and willing to participate: Yes Program Orientation Provided & Reviewed with Pt/Caregiver Including Roles  & Responsibilities: Yes   Decrease burden of Care through IP rehab admission: n/a   Possible need for SNF placement upon discharge:not anticipated   Patient Condition: This patient's condition remains as documented in the consult dated 05/23/2020, in which the Rehabilitation Physician determined and documented that the patient's condition is appropriate for intensive rehabilitative care in an inpatient rehabilitation facility. Will admit to inpatient rehab today.   Preadmission Screen Completed By: Ottie Glazier RN MSN with updates by   Megan Salon, MS CCC-SLP ______________________________________________________________________   Discussed status with Dr. Riley Kill on 8/21/21at 830  and received approval for admission today.   Admission Coordinator: Ottie Glazier RN MSN with updates by Megan Salon, time [phone] Dorna Bloom 05/24/20

## 2020-05-24 NOTE — Progress Notes (Signed)
Report given to Willey Blade, RN for transfer to 4W 10.

## 2020-05-24 NOTE — Progress Notes (Signed)
Pt ready to go to 4W 10 via wheelchair.

## 2020-05-24 NOTE — H&P (Signed)
Physical Medicine and Rehabilitation Admission H&P        Chief Complaint  Patient presents with  . Weakness  : HPI: Jaime [name] is a 24 year old right-handed male with history of documented mild autism, hypotension and recent diagnosis of multiple sclerosis diagnosed March 2021 followed by Dr.Yan as well as Dr. Epimenio Foot not on any immune modulating medications at this time.  Well-known to rehab services from MS exacerbation 12/17/2019 to 12/26/2019.  Per chart review lives in a two-level home bed and bath upstairs 2 steps to entry.  Needed assistance for tub transfers recently.  Lives with his mother and 75 and 16 year old siblings.  Presented 05/19/2020 with increasing gait abnormality and needing more help with ADLs dragging his right leg and pocketing of his food as well as bouts of urinary incontinence.  MRI of the brain showed severe white matter disease in a pattern consistent with multiple sclerosis numerous active demyelinating lesions throughout the supra and infratentorial brain.  Admission chemistries unremarkable except glucose 111, SARS current virus negative.  Neurology follow-up placed on IV Solu-Medrol x5 doses initiated 05/20/2020.  Subcutaneous Lovenox for DVT prophylaxis.  Tolerating a regular diet.  Therapy evaluations completed and patient was admitted for a comprehensive rehab program   Review of Systems  Constitutional: Negative for chills and fever.  HENT: Negative for hearing loss.   Eyes: Negative for blurred vision and double vision.  Respiratory: Negative for cough and shortness of breath.   Cardiovascular: Negative for chest pain, palpitations and leg swelling.  Gastrointestinal: Negative for abdominal pain, heartburn, nausea and vomiting.  Genitourinary: Negative for dysuria and hematuria.       Bouts of urinary incontinence  Musculoskeletal: Positive for joint pain and myalgias.  Skin: Negative for rash.  Neurological: Positive for sensory change and  weakness.  All other systems reviewed and are negative.       Past Medical History:  Diagnosis Date  . Autism    . Depression    . MS (multiple sclerosis) (HCC)      History reviewed. No pertinent surgical history.      Family History  Problem Relation Age of Onset  . Clotting disorder Mother      Social History:  reports that he has never smoked. He has never used smokeless tobacco. He reports that he does not drink alcohol and does not use drugs. Allergies: No Known Allergies       Medications Prior to Admission  Medication Sig Dispense Refill  . midodrine (PROAMATINE) 5 MG tablet Take 1 tablet (5 mg total) by mouth 3 (three) times daily with meals. 90 tablet 11  . pantoprazole (PROTONIX) 40 MG tablet Take 1 tablet (40 mg total) by mouth daily. 30 tablet 0  . acetaminophen (TYLENOL) 325 MG tablet Take 2 tablets (650 mg total) by mouth every 6 (six) hours as needed for mild pain (or Fever >/= 101). (Patient not taking: Reported on 05/20/2020)          Drug Regimen Review Drug regimen was reviewed and remains appropriate with no significant issues identified   Home: Home Living Family/patient expects to be discharged to:: Private residence Living Arrangements: Parent, Other relatives Available Help at Discharge: Family (mom, 34 and 55 y.o.sisters) Type of Home: Apartment Home Access: Stairs to enter [employer] of Steps: 2 Entrance Stairs-Rails: Right Home Layout: Two level, Bed/bath upstairs Alternate Level Stairs-Number of Steps: full flight Alternate Level Stairs-Rails: Right Bathroom Shower/Tub: Engineer, manufacturing systems:  Standard Bathroom Accessibility: Yes Home Equipment: None Additional Comments: per pt report, lives in a two story apartment; reports recent falls at home. May need to clarify home setup details with family as conflicting reports given from chart reviews   Functional History: Prior Function Level of Independence: Needs  assistance Gait / Transfers Assistance Needed: reports 'furniture walking' around home ADL's / Homemaking Assistance Needed: pt reports needing assistance for tub transfers recently, reports a recent fall in the bathroom Comments: enjoys playing guitar, playing zelda, used to volunteer at [employer]   Functional Status:  Mobility: Bed Mobility Overal bed mobility: Needs Assistance Bed Mobility: Supine to Sit Supine to sit: Supervision General bed mobility comments: increased time and close guard for safety due to ataxic movement patterns Transfers Overall transfer level: Needs assistance Equipment used: Rolling walker (2 wheeled) Transfers: Sit to/from Stand Sit to Stand: Min assist General transfer comment: MinA to rise and steady from edge of bed and toilet Ambulation/Gait Ambulation/Gait assistance: Min assist, Mod assist Gait Distance (Feet): 65 Feet (15", 10", 40") Assistive device: Rolling walker (2 wheeled) Gait Pattern/deviations: Step-through pattern, Decreased stride length, Scissoring, Narrow base of support General Gait Details: Pt ambulating 15 ft to bathroom and back, 10 ft to hallway, and then 40 ft in hallway, requiring seated rest breaks and chair follow. Min-modA for stability, pt sometimes impulsive and attempting to sit before chair is behind him. Cues for wider BOS (pt tending to scissor at some points) and walker proximity. Decreased gross motor coordination Gait velocity: reduced Gait velocity interpretation: <1.8 ft/sec, indicate of risk for recurrent falls   ADL: ADL Overall ADL's : Needs assistance/impaired Eating/Feeding: Set up, Sitting Grooming: Minimal assistance, Standing Grooming Details (indicate cue type and reason): standing at sink to complete oral care- occasional min A needed to maintain balance. Difficulty with in hand manipulation noted during grooming Upper Body Bathing: Minimal assistance, Sitting Lower Body Bathing: Moderate assistance, Sit  to/from stand, Sitting/lateral leans Upper Body Dressing : Minimal assistance, Sitting Lower Body Dressing: Moderate assistance, Sit to/from stand, Sitting/lateral leans Toilet Transfer: Minimal assistance, Ambulation, RW, Regular Toilet Toilet Transfer Details (indicate cue type and reason): assist to maintain balance and safety Toileting- Clothing Manipulation and Hygiene: Minimal assistance, Sit to/from stand Toileting - Clothing Manipulation Details (indicate cue type and reason): has been having incontinence episodes Tub/ Shower Transfer: Moderate assistance, Rolling walker Tub/Shower Transfer Details (indicate cue type and reason): has had falls in bathroom Functional mobility during ADLs: Minimal assistance, Rolling walker, Cueing for safety   Cognition: Cognition Overall Cognitive Status: History of cognitive impairments - at baseline Orientation Level: Oriented X4 Cognition Arousal/Alertness: Awake/alert Behavior During Therapy: Flat affect Overall Cognitive Status: History of cognitive impairments - at baseline General Comments: Pt with delayed processing/response time, flat affect   Physical Exam: Blood pressure 114/71, pulse (!) 50, temperature 97.9 F (36.6 C), temperature source Oral, resp. rate 16, height 5\' 9"  (1.753 m), weight 60 kg, SpO2 99 %. Physical Exam Constitutional:      General: He is not in acute distress. HENT:     Head: Normocephalic.     Right Ear: External ear normal.     Left Ear: External ear normal.     Nose: Nose normal.  Eyes:     Pupils: Pupils are equal, round, and reactive to light.  Cardiovascular:     Rate and Rhythm: Normal rate and regular rhythm.     Heart sounds: No murmur heard.  No gallop.   Pulmonary:  Effort: Pulmonary effort is normal. No respiratory distress.     Breath sounds: Normal breath sounds. No stridor. No wheezing.  Abdominal:     General: Abdomen is flat. Bowel sounds are normal. There is no distension.      Palpations: Abdomen is soft. There is no mass.  Musculoskeletal:        General: No swelling or tenderness. Normal range of motion.     Cervical back: Normal range of motion and neck supple.  Skin:    General: Skin is warm and dry.  Neurological:     Mental Status: He is alert.     Comments: Patient is alert makes good . eye contact with examiner.  Cooperative with exam.  Follows commands.  Provides his name age and date of birth. Reasonable insight and awareness. Halting speech. Language normal. Good insight and awareness. Strength 5/5 in all 4's. Sensation intact to LT/pain in all 4's. Decreased FTN, HTS. All 4 limbs ataxic.   Psychiatric:     Comments: Cooperative. Flat affect.         Lab Results Last 48 Hours        Results for orders placed or performed during the hospital encounter of 05/19/20 (from the past 48 hour(s))  Basic metabolic panel     Status: Abnormal    Collection Time: 05/22/20  3:43 AM  Result Value Ref Range    Sodium 140 135 - 145 mmol/L    Potassium 4.0 3.5 - 5.1 mmol/L    Chloride 108 98 - 111 mmol/L    CO2 22 22 - 32 mmol/L    Glucose, Bld 145 (H) 70 - 99 mg/dL      Comment: Glucose reference range applies only to samples taken after fasting for at least 8 hours.    BUN 17 6 - 20 mg/dL    Creatinine, Ser 3.30 0.61 - 1.24 mg/dL    Calcium 9.7 8.9 - 07.6 mg/dL    GFR calc non Af Amer >60 >60 mL/min    GFR calc Af Amer >60 >60 mL/min    Anion gap 10 5 - 15      Comment: Performed at [facility], [address], [place], [place] [address]  CBC with Differential/Platelet     Status: Abnormal    Collection Time: 05/22/20  3:43 AM  Result Value Ref Range    WBC 21.3 (H) 4.0 - 10.5 K/uL    RBC 4.46 4.22 - 5.81 MIL/uL    Hemoglobin 13.7 13.0 - 17.0 g/dL    HCT 35.4 39 - 52 %    MCV 90.1 80.0 - 100.0 fL    MCH 30.7 26.0 - 34.0 pg    MCHC 34.1 30.0 - 36.0 g/dL    RDW 56.2 56.3 - 89.3 %    Platelets 254 150 - 400 K/uL    nRBC 0.0 0.0 - 0.2 %     Neutrophils Relative % 91 %    Neutro Abs 19.4 (H) 1.7 - 7.7 K/uL    Lymphocytes Relative 3 %    Lymphs Abs 0.7 0.7 - 4.0 K/uL    Monocytes Relative 5 %    Monocytes Absolute 1.1 (H) 0 - 1 K/uL    Eosinophils Relative 0 %    Eosinophils Absolute 0.0 0 - 0 K/uL    Basophils Relative 0 %    Basophils Absolute 0.0 0 - 0 K/uL    Immature Granulocytes 1 %    Abs Immature  Granulocytes 0.10 (H) 0.00 - 0.07 K/uL      Comment: Performed at [facility], [address] [place], [place] [address]      Imaging Results (Last 48 hours)  No results found.           Medical Problem List and Plan: 1.  Gait abnormality with ataxia secondary to MS exacerbation.  IV Solu-Medrol completed             -patient may shower             -ELOS/Goals: 5-7 days, mod I 2.  Antithrombotics: -DVT/anticoagulation: Lovenox             -antiplatelet therapy: N/A 3. Pain Management: Tylenol as needed 4. Mood: Provide emotional support             -antipsychotic agents: N/A 5. Neuropsych: This patient is capable of making decisions on his own behalf. 6. Skin/Wound Care: Routine skin checks 7. Fluids/Electrolytes/Nutrition: Routine in and outs with follow-up chemistries on Monday.  8.  High functioning autism.  Stable at baseline 9.  Dysautonomia orthostatic hypotension syndrome.  ProAmatine 5 mg 3 times daily             -monitor bp's with activity       Charlton Amor, PA-C 05/23/2020  I have personally performed a face to face diagnostic evaluation of this patient and formulated the key components of the plan.  Additionally, I have personally reviewed laboratory data, imaging studies, as well as relevant notes and concur with the physician assistant's documentation above.  The patient's status has not changed from the original H&P.  Any changes in documentation from the acute care chart have been noted above.  Ranelle Oyster, MD, [place]

## 2020-05-24 NOTE — Progress Notes (Signed)
Inpatient Rehab Admissions Coordinator:   I have a bed available on CIR today. Attending and Pt. Family have been notified.   Megan Salon, MS, CCC-SLP Rehab Admissions Coordinator  [phone] (celll) [phone] (office)

## 2020-05-24 NOTE — Discharge Summary (Signed)
Jaime [name] WIO:[id number] DOB: Apr 26, 1996 DOA: 05/19/2020  PCP: Patient, No Pcp Per  Admit date: 05/19/2020 Discharge date: 05/24/2020  Admitted From: home Disposition:  CIR  Recommendations for Outpatient Follow-up:  1. Needs close follow up with Dr. Despina Arias with Neurology on discharge to start disease modifying agent for his MS ( neurology has helped arrange this)   Home Health:none  Equipment/Devices:none Discharge Condition:Stable CODE STATUS:FULL    Brief/Interim Summary: History of present illness:  Jaime [name] is a 24 y.o. year old male with medical history significant for multiple sclerosis recently diagnosed in March 2021 during hospitalization at that time and had rehab at [facility], since then he has not been on any chronic immunomodulating therapy.  He presented with worsening weakness, gait abnormality, difficulty getting out of tub, dragging right leg, episodes of urine incontinence, choking on food for several weeks and was found to have active demyelinating lesions on MRI of Brain in the ED concerning for severe multiple sclerosis exacerbation throughout the supra and infratentorial brain.  Neurology was consulted and recommended pulse Solu-Medrol dose therapy started on 8/17.  He completed 5 days of therapy with improvement in strength before being discharged to CIR on 8/21 for continued inpatient rehabilitation. Remaining hospital course addressed in problem based format below:   Hospital Course:   Recently diagnosed MS with multiple active demyelinating lesions found on MRI in the setting of worsening gait disturbances, weakness, urinary incontinence, dysphagia, consistent with acute exacerbation of MS, now improving Diagnosed in 12/2019. Several weeks of worsening symptoms and unfortunately being lost to follow up and not actually establishing with outpatient neurology after last hospitalization. He has not been on any immune modulating therapy.  - tolerated 5  days of Iv Solumedrol 100 0mg  as recommended by Neurology -his case was discussed with Dr. 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 No localizing signs of infection, no fevers  High functioning autism, stable at baseline   Consultations:  Neurology  Procedures/Studies: none Subjective: Feels strength continues to improve Discharge Exam: Vitals:   05/23/20 2127 05/24/20 0507  BP: 128/73 116/77  Pulse: 64 60  Resp: 18 16  Temp: 99.4 F (37.4 C) 98.2 F (36.8 C)  SpO2: 100% 100%   Vitals:   05/23/20 0545 05/23/20 1340 05/23/20 2127 05/24/20 0507  BP: 114/71 103/74 128/73 116/77  Pulse: (!) 50 65 64 60  Resp: 16 16 18 16   Temp: 97.9 F (36.6 C) 98.3 F (36.8 C) 99.4 F (37.4 C) 98.2 F (36.8 C)  TempSrc: Oral Oral Oral Oral  SpO2: 99% 100% 100% 100%  Weight:      Height:        General: Lying in bed, no apparent distress Eyes: EOMI, anicteric ENT: Oral Mucosa clear and moist Cardiovascular: regular rate and rhythm, no murmurs, rubs or gallops, no edema, Respiratory: Normal respiratory effort on room air, lungs clear to auscultation bilaterally Abdomen: soft, non-distended, non-tender, normal bowel sounds Skin: No Rash Neurologic: Mental status AAOx3, speech normal, 4/5 strength in upper and lower extremities of right arm. Baseline tremors. Psychiatric:Appropriate affect, and mood  Discharge Diagnoses:  Principal Problem:   Exacerbation of multiple sclerosis (HCC) Active Problems:   Lower extremity weakness   Autism spectrum disorder   Poor balance  POTS (postural orthostatic tachycardia syndrome)   Depression   Leucocytosis    Discharge Instructions  Discharge Instructions    Diet - low sodium heart  healthy   Complete by: As directed    Increase activity slowly   Complete by: As directed      Allergies as of 05/24/2020   No Known Allergies     Medication List    STOP taking these medications   acetaminophen 325 MG tablet Commonly known as: TYLENOL     TAKE these medications   midodrine 5 MG tablet Commonly known as: PROAMATINE Take 1 tablet (5 mg total) by mouth 3 (three) times daily with meals.   pantoprazole 40 MG tablet Commonly known as: PROTONIX Take 1 tablet (40 mg total) by mouth daily.       No Known Allergies      The results of significant diagnostics from this hospitalization (including imaging, microbiology, ancillary and laboratory) are listed below for reference.     Microbiology: Recent Results (from the past 240 hour(s))  SARS Coronavirus 2 by RT PCR (hospital order, performed in [facility] hospital lab) Nasopharyngeal Nasopharyngeal Swab     Status: None   Collection Time: 05/20/20  1:20 PM   Specimen: Nasopharyngeal Swab  Result Value Ref Range Status   SARS Coronavirus 2 NEGATIVE NEGATIVE Final    Comment: (NOTE) SARS-CoV-2 target nucleic acids are NOT DETECTED.  The SARS-CoV-2 RNA is generally detectable in upper and lower respiratory specimens during the acute phase of infection. The lowest concentration of SARS-CoV-2 viral copies this assay can detect is 250 copies / mL. A negative result does not preclude SARS-CoV-2 infection and should not be used as the sole basis for treatment or other patient management decisions.  A negative result may occur with improper specimen collection / handling, submission of specimen other than nasopharyngeal swab, presence of viral mutation(s) within the areas targeted by this assay, and inadequate number of viral copies (<250 copies / mL). A negative result must be combined with clinical observations, patient history, and epidemiological information.  Fact Sheet for Patients:    BoilerBrush.com.cy  Fact Sheet for Healthcare Providers: https://pope.com/  This test is not yet approved or  cleared by the [place] FDA and has been authorized for detection and/or diagnosis of SARS-CoV-2 by FDA under an Emergency Use Authorization (EUA).  This EUA will remain in effect (meaning this test can be used) for the duration of the COVID-19 declaration under Section 564(b)(1) of the Act, 21 U.S.C. section 360bbb-3(b)(1), unless the authorization is terminated or revoked sooner.  Performed at [facility], [address], [place], [place] [address]      Labs: BNP (last 3 results) No results for input(s): BNP in the last 8760 hours. Basic Metabolic Panel: Recent Labs  Lab 05/19/20 2000 05/22/20 0343  NA 139 140  K 3.6 4.0  CL 107 108  CO2 21* 22  GLUCOSE 111* 145*  BUN 14 17  CREATININE 0.79 0.92  CALCIUM 9.4 9.7   Liver Function Tests: No results for input(s): AST, ALT, ALKPHOS, BILITOT, PROT, ALBUMIN in the last 168 hours. No results for input(s): LIPASE, AMYLASE in the last 168 hours. No results for input(s): AMMONIA in the last 168 hours. CBC: Recent Labs  Lab 05/19/20 2000 05/22/20 0343  WBC 9.1 21.3*  NEUTROABS  --  19.4*  HGB 15.3 13.7  HCT 45.5 40.2  MCV 89.7 90.1  PLT 262 254   Cardiac Enzymes: No  results for input(s): CKTOTAL, CKMB, CKMBINDEX, TROPONINI in the last 168 hours. BNP: Invalid input(s): POCBNP CBG: No results for input(s): GLUCAP in the last 168 hours. D-Dimer No results for input(s): DDIMER in the last 72 hours. Hgb A1c No results for input(s): HGBA1C in the last 72 hours. Lipid Profile No results for input(s): CHOL, HDL, LDLCALC, TRIG, CHOLHDL, LDLDIRECT in the last 72 hours. Thyroid function studies No results for input(s): TSH, T4TOTAL, T3FREE, THYROIDAB in the last 72 hours.  Invalid input(s): FREET3 Anemia work up No results for input(s): VITAMINB12,  FOLATE, FERRITIN, TIBC, IRON, RETICCTPCT in the last 72 hours. Urinalysis    Component Value Date/Time   COLORURINE YELLOW 12/21/2019 1817   APPEARANCEUR CLEAR 12/21/2019 1817   LABSPEC 1.010 12/21/2019 1817   PHURINE 7.0 12/21/2019 1817   GLUCOSEU NEGATIVE 12/21/2019 1817   HGBUR NEGATIVE 12/21/2019 1817   BILIRUBINUR NEGATIVE 12/21/2019 1817   KETONESUR NEGATIVE 12/21/2019 1817   PROTEINUR NEGATIVE 12/21/2019 1817   NITRITE POSITIVE (A) 12/21/2019 1817   LEUKOCYTESUR NEGATIVE 12/21/2019 1817   Sepsis Labs Invalid input(s): PROCALCITONIN,  WBC,  LACTICIDVEN Microbiology Recent Results (from the past 240 hour(s))  SARS Coronavirus 2 by RT PCR (hospital order, performed in [facility] hospital lab) Nasopharyngeal Nasopharyngeal Swab     Status: None   Collection Time: 05/20/20  1:20 PM   Specimen: Nasopharyngeal Swab  Result Value Ref Range Status   SARS Coronavirus 2 NEGATIVE NEGATIVE Final    Comment: (NOTE) SARS-CoV-2 target nucleic acids are NOT DETECTED.  The SARS-CoV-2 RNA is generally detectable in upper and lower respiratory specimens during the acute phase of infection. The lowest concentration of SARS-CoV-2 viral copies this assay can detect is 250 copies / mL. A negative result does not preclude SARS-CoV-2 infection and should not be used as the sole basis for treatment or other patient management decisions.  A negative result may occur with improper specimen collection / handling, submission of specimen other than nasopharyngeal swab, presence of viral mutation(s) within the areas targeted by this assay, and inadequate number of viral copies (<250 copies / mL). A negative result must be combined with clinical observations, patient history, and epidemiological information.  Fact Sheet for Patients:   BoilerBrush.com.cy  Fact Sheet for Healthcare Providers: https://pope.com/  This test is not yet approved or   cleared by the [place] FDA and has been authorized for detection and/or diagnosis of SARS-CoV-2 by FDA under an Emergency Use Authorization (EUA).  This EUA will remain in effect (meaning this test can be used) for the duration of the COVID-19 declaration under Section 564(b)(1) of the Act, 21 U.S.C. section 360bbb-3(b)(1), unless the authorization is terminated or revoked sooner.  Performed at [facility], [address] [place], [place] [address]      Time coordinating discharge: Over 30 minutes  SIGNED:   Laverna Peace, MD  [facility] 05/24/2020, 9:39 AM Pager   If 7PM-7AM, please contact night-coverage [email or web address] Password [email or web address]

## 2020-05-25 ENCOUNTER — Inpatient Hospital Stay (HOSPITAL_COMMUNITY): Payer: Medicaid Other

## 2020-05-25 MED ORDER — PANTOPRAZOLE SODIUM 40 MG PO TBEC
40.0000 mg | DELAYED_RELEASE_TABLET | Freq: Every day | ORAL | Status: DC
  Administered 2020-05-25 – 2020-06-05 (×12): 40 mg via ORAL
  Filled 2020-05-25 (×12): qty 1

## 2020-05-25 MED ORDER — MELATONIN 3 MG PO TABS
6.0000 mg | ORAL_TABLET | Freq: Every day | ORAL | Status: DC
  Administered 2020-05-25 – 2020-06-04 (×11): 6 mg via ORAL
  Filled 2020-05-25 (×11): qty 2

## 2020-05-25 NOTE — Evaluation (Addendum)
Occupational Therapy Assessment and Plan  Patient Details  Name: Jaime [name] MRN: [id number] Date of Birth: 03/25/1996  OT Diagnosis: cognitive deficits and muscle weakness (generalized) Rehab Potential: Rehab Potential (ACUTE ONLY): Good ELOS: 10-14 days   Today's Date: 05/25/2020 OT Individual Time: 8144-8185 OT Individual Time Calculation (min): 70 min     Hospital Problem: Principal Problem:   Multiple sclerosis exacerbation ([place])   Past Medical History:  Past Medical History:  Diagnosis Date   Autism    Depression    MS (multiple sclerosis) ([place])    Past Surgical History: History reviewed. No pertinent surgical history.  Assessment & Plan Clinical Impression: Jaime [name] is a 24 year old right-handed male with history of documented mild autism, hypotension and recent diagnosis of multiple sclerosis diagnosed March 2021 followed by Dr.Yanas well as Dr. Felecia Shelling not on any immune modulating medications at this time.Well-known to rehab services from [place] exacerbation 12/17/2019 to 12/26/2019. Per chart review lives in a two-level home bed and bath upstairs 2 steps to entry. Needed assistance for tub transfers recently. Lives with his mother and 27 and 21 year old siblings. Presented 05/19/2020 with increasing gait abnormality and needing more help with ADLs dragging his right leg and pocketing of his food as well as bouts of urinary incontinence. MRI of the brain showed severe white matter disease in a pattern consistent with multiple sclerosis numerous active demyelinating lesions throughout the supra and infratentorial brain. Admission chemistries unremarkable except glucose 111, SARS current virus negative. Neurology follow-up placed on IV Solu-Medrol x5 doses initiated 05/20/2020. Subcutaneous Lovenox for DVT prophylaxis. Tolerating a regular diet. Therapy evaluations completed and patient was admitted for a comprehensive rehab program.  Patient transferred to CIR  on 05/24/2020 .    Patient currently requires min with basic self-care skills secondary to muscle weakness, decreased cardiorespiratoy endurance, ataxia and decreased coordination and decreased sitting balance, decreased standing balance, decreased postural control and decreased balance strategies.  Prior to hospitalization, patient could complete ADLs with modified independent .  Patient will benefit from skilled intervention to decrease level of assist with basic self-care skills prior to discharge home with care partner.  Anticipate patient will require 24 hour supervision and follow up home health and follow up outpatient.  OT - End of Session Activity Tolerance: Tolerates 10 - 20 min activity with multiple rests Endurance Deficit: Yes Endurance Deficit Description: generalized weakness, MS OT Assessment Rehab Potential (ACUTE ONLY): Good OT Barriers to Discharge: Inaccessible home environment OT Barriers to Discharge Comments: bedroom/bathroom upstairs OT Patient demonstrates impairments in the following area(s): Balance;Endurance;Vision;Motor;Safety OT Basic ADL's Functional Problem(s): Eating;Grooming;Bathing;Dressing;Toileting OT Transfers Functional Problem(s): Toilet;Tub/Shower OT Additional Impairment(s): None OT Plan OT Intensity: Minimum of 1-2 x/day, 45 to 90 minutes OT Frequency: 5 out of 7 days OT Duration/Estimated Length of Stay: 7-10 days OT Treatment/Interventions: Balance/vestibular training;Discharge planning;Self Care/advanced ADL retraining;Therapeutic Activities;Functional mobility training;Patient/family education;Therapeutic Exercise;Visual/perceptual remediation/compensation;UE/LE Coordination activities;Disease mangement/prevention;Wheelchair propulsion/positioning;UE/LE Strength taining/ROM;Psychosocial support;Neuromuscular re-education;DME/adaptive equipment instruction;Community reintegration OT Self Feeding Anticipated Outcome(s): no goal set OT Basic  Self-Care Anticipated Outcome(s): (S) OT Toileting Anticipated Outcome(s): (S) OT Bathroom Transfers Anticipated Outcome(s): (S) OT Recommendation Recommendations for Other Services: Neuropsych consult Patient destination: Home Follow Up Recommendations: Outpatient OT Equipment Recommended: To be determined   OT Evaluation Precautions/Restrictions  Precautions Precautions: Fall Precaution Comments: hx of POTS Restrictions Weight Bearing Restrictions: No General Chart Reviewed: Yes Family/Caregiver Present: No  Pain Pain Assessment Pain Scale: 0-10 Pain Score: 0-No pain Home Living/Prior Functioning Home Living Available Help at Discharge: Family,  Available 24 hours/day Type of Home: Apartment Home Access: Stairs to enter Entrance Stairs-Number of Steps: 2 Entrance Stairs-Rails: Right Home Layout: Two level, Bed/bath upstairs Alternate Level Stairs-Number of Steps: full flight Alternate Level Stairs-Rails: Right Bathroom Shower/Tub: Optometrist: Yes  Lives With: Family IADL History Homemaking Responsibilities: No Current License: No Prior Function Level of Independence: Independent with basic ADLs, Independent with transfers, Independent with gait  Able to Take Stairs?: Yes Driving: No Comments: enjoys playing guitar, playing zelda, used to volunteer at [employer] Vision/History: Wears glasses Wears Glasses: At all times Patient Visual Report: Blurring of vision (R eye blurry) Vision Assessment?: Vision impaired- to be further tested in functional context Perception  Perception: Within Functional Limits Praxis Praxis: Intact Cognition Overall Cognitive Status: History of cognitive impairments - at baseline Arousal/Alertness: Awake/alert Orientation Level: Person;Place;Situation Person: Oriented Place: Oriented Situation: Oriented Year: 2021 Month: August Day of Week: Correct Memory: Appears  intact Immediate Memory Recall: Sock;Blue;Bed Memory Recall Sock: Without Cue Memory Recall Blue: Without Cue Memory Recall Bed: Without Cue Attention: Selective Selective Attention: Appears intact Safety/Judgment: Appears intact Sensation Sensation Light Touch: Appears Intact Hot/Cold: Appears Intact Coordination Gross Motor Movements are Fluid and Coordinated: No Fine Motor Movements are Fluid and Coordinated: No Coordination and Movement Description: ataxic and gross tremors Motor  Motor Motor: Ataxia Motor - Skilled Clinical Observations: ataxic, gross tremors  Trunk/Postural Assessment  Cervical Assessment Cervical Assessment: Within Functional Limits Thoracic Assessment Thoracic Assessment: Exceptions to [facility] (kyphotic posture in standing) Lumbar Assessment Lumbar Assessment: Exceptions to [facility] (posterior pelvic tilt) Postural Control Postural Control: Deficits on evaluation Righting Reactions: delayed and limited Protective Responses: delayed and limited  Balance Balance Balance Assessed: Yes Static Sitting Balance Static Sitting - Balance Support: Feet supported Static Sitting - Level of Assistance: 5: Stand by assistance Dynamic Sitting Balance Dynamic Sitting - Balance Support: Feet supported Dynamic Sitting - Level of Assistance: 5: Stand by assistance Static Standing Balance Static Standing - Balance Support: During functional activity;Bilateral upper extremity supported Static Standing - Level of Assistance: 4: Min assist Dynamic Standing Balance Dynamic Standing - Balance Support: Bilateral upper extremity supported;During functional activity Dynamic Standing - Level of Assistance: 4: Min assist Extremity/Trunk Assessment RUE Assessment RUE Assessment: Exceptions to [facility] General Strength Comments: Generalized weakness, ataxic with tremors at rest and with intention LUE Assessment LUE Assessment: Exceptions to [facility] General Strength Comments: Generalized  weakness, ataxic with tremors at rest and with intention  Care Tool Care Tool Self Care Eating   Eating Assist Level: Set up assist    Oral Care    Oral Care Assist Level: Set up assist    Bathing   Body parts bathed by patient: Right arm;Right lower leg;Left arm;Left lower leg;Chest;Face;Abdomen;Front perineal area;Buttocks;Right upper leg;Left upper leg     Assist Level: Minimal Assistance - Patient > 75%    Upper Body Dressing(including orthotics)   What is the patient wearing?: Pull over shirt   Assist Level: Supervision/Verbal cueing    Lower Body Dressing (excluding footwear)   What is the patient wearing?: Underwear/pull up;Pants Assist for lower body dressing: Minimal Assistance - Patient > 75%    Putting on/Taking off footwear   What is the patient wearing?: Non-skid slipper socks Assist for footwear: Moderate Assistance - Patient 50 - 74%       Care Tool Toileting Toileting activity   Assist for toileting: Minimal Assistance - Patient > 75%     Care Tool Bed Mobility  Roll left and right activity   Roll left and right assist level: Supervision/Verbal cueing    Sit to lying activity   Sit to lying assist level: Contact Guard/Touching assist    Lying to sitting edge of bed activity   Lying to sitting edge of bed assist level: Contact Guard/Touching assist     Care Tool Transfers Sit to stand transfer   Sit to stand assist level: Minimal Assistance - Patient > 75%    Chair/bed transfer   Chair/bed transfer assist level: Minimal Assistance - Patient > 75%     Toilet transfer   Assist Level: Minimal Assistance - Patient > 75%     Care Tool Cognition Expression of Ideas and Wants Expression of Ideas and Wants: Some difficulty - exhibits some difficulty with expressing needs and ideas (e.g, some words or finishing thoughts) or speech is not clear   Understanding Verbal and Non-Verbal Content Understanding Verbal and Non-Verbal Content: Usually understands  - understands most conversations, but misses some part/intent of message. Requires cues at times to understand   Memory/Recall Ability *first 3 days only Memory/Recall Ability *first 3 days only: Current season;Location of own room;Staff names and faces;That he or she is in a hospital/hospital unit    Refer to Care Plan for [place] 1 OT Short Term Goal 1 (Week 1): STG= LTG d/t ELOS  Recommendations for other services: Neuropsych   Education provided re ELOS, OT POC, rehab expectations and MS education. Min cueing provided for initiation and safety/sequencing during ADLs at shower level.   Skilled Therapeutic Intervention ADL ADL Eating: Set up;Minimal cueing Where Assessed-Eating: Chair Grooming: Setup Where Assessed-Grooming: Chair Upper Body Bathing: Supervision/safety Where Assessed-Upper Body Bathing: Shower Lower Body Bathing: Minimal assistance;Minimal cueing Where Assessed-Lower Body Bathing: Shower Upper Body Dressing: Supervision/safety Where Assessed-Upper Body Dressing: Chair Lower Body Dressing: Minimal cueing;Minimal assistance Where Assessed-Lower Body Dressing: Chair Toileting: Minimal assistance;Minimal cueing Where Assessed-Toileting: Glass blower/designer: Minimal assistance;Minimal Research officer, trade union Method: Counselling psychologist: Energy manager: Minimal Hydrologist Method: Heritage manager: Civil engineer, contracting with back;Grab bars Mobility  Bed Mobility Bed Mobility: Sit to Supine;Supine to Sit Supine to Sit: Contact Guard/Touching assist Sit to Supine: Contact Guard/Touching assist Transfers Sit to Stand: Minimal Assistance - Patient > 75% Stand to Sit: Minimal Assistance - Patient > 75%   Discharge Criteria: Patient will be discharged from OT if patient refuses treatment 3 consecutive times without medical reason, if treatment  goals not met, if there is a change in medical status, if patient makes no progress towards goals or if patient is discharged from hospital.  The above assessment, treatment plan, treatment alternatives and goals were discussed and mutually agreed upon: by patient  Curtis Sites 05/25/2020, 9:52 AM

## 2020-05-25 NOTE — Progress Notes (Signed)
Inpatient Rehabilitation Medication Review by a Pharmacist  A complete drug regimen review was completed for this patient to identify any potential clinically significant medication issues.  Clinically significant medication issues were identified:  yes   Type of Medication Issue Identified Description of Issue Urgent (address now) Non-Urgent (address on AM team rounds) Plan Plan Accepted by Provider? (Yes / No / Pending AM Rounds)  Drug Interaction(s) (clinically significant)       Duplicate Therapy       Allergy       No Medication Administration End Date       Incorrect Dose       Additional Drug Therapy Needed  Pantoprazole not yet ordered Non-urgent Secure chat rehab provider Yes, pantoprazole has been ordered now  Other         For non-urgent medication issues to be resolved on team rounds tomorrow morning a [facility] Secure Chat Handoff was sent to: Dr. Riley Kill   Pharmacist comments: Sent secure chat to provider to see if appropriate to restart pantoprazole today--now ordered.   Time spent performing this drug regimen review (minutes):  15   Pervis Hocking, PharmD PGY1 Pharmacy Resident 05/25/2020 7:44 AM  Please check [email or web address] for unit-specific pharmacy phone numbers.

## 2020-05-25 NOTE — Progress Notes (Signed)
Physical Therapy Session Note  Patient Details  Name: Jaime [name] MRN: [id number] Date of Birth: 09-12-1996  Today's Date: 05/25/2020 PT Individual Time: 1445-1530 PT Individual Time Calculation (min): 45 min   Short Term Goals: Week 1: = LTGs due to ELOS    Skilled Therapeutic Interventions/Progress Updates:  Pt seated in recliner.  He denied pain.  Stand pivot recliner> w/c with min/mod assist.  Stand pivot w/c> mat to L with min assist.  neuromuscular re-education via multimodal cues, in supine-  for bil bridging with adductor squeezes, bil lower trunk rotation, cervical flexion; in R/L side lying - for L/R hip abduction in hip and knee flexion. In standing with bil UE support- calf raises, limited by dizziness..  Rolling L><R on flat mat, no rails, supervision.  Supine> sit to R as per home set up, with supervision. Upon sitting up, pt with dizziness that resolved in 20 seconds.  W/c propulsion x 40' with supervision and tactile cues for L hand placement; pt tends to place hands on spokes rather than rims.  Gait training iwht RW on level tile x 50' including 2 turns, RW, min assist.  Cues for upright posture, forward gaze.  After resting, up/down 4 steps 2 rails with CGA, step-through method self selected.  Upon standing up, pt reported mild dizziness, but it decreased with time in upright position.    At end of session, pt in recliner with needs at hand and seat pad alarm set.   Therapy Documentation Precautions:  Precautions Precautions: Fall Precaution Comments: hx of POTS Restrictions Weight Bearing Restrictions: No   Vital Signs: Therapy Vitals Pulse Rate: 86 BP: 93/64, seated  Pain Assessment Pain Scale: 0-10 Pain Score: 0-No pain       Therapy/Group: Individual Therapy  [name] 05/25/2020, 5:50 PM

## 2020-05-25 NOTE — Evaluation (Signed)
Speech Language Pathology Assessment and Plan  Patient Details  Name: Jaime [name] MRN: [id number] Date of Birth: 15-Sep-1996  Today's Date: 05/25/2020 SLP Individual Time: 1530-1550 SLP Individual Time Calculation (min): 20 min   Hospital Problem: Principal Problem:   Multiple sclerosis exacerbation ([place])  Past Medical History:  Past Medical History:  Diagnosis Date  . Autism   . Depression   . MS (multiple sclerosis) ([place])    Past Surgical History: History reviewed. No pertinent surgical history.  Assessment / Plan / Recommendation Clinical Impression   Jaime [name] is a 24 year old right-handed male with history of documented mild autism, hypotension and recent diagnosis of multiple sclerosis diagnosed March 2021 followed by Dr.Yanas well as Dr. Felecia Shelling not on any immune modulating medications at this time.Well-known to rehab services from [place] exacerbation 12/17/2019 to 12/26/2019. Per chart review lives in a two-level home bed and bath upstairs 2 steps to entry. Needed assistance for tub transfers recently. Lives with his mother and 51 and 29 year old siblings. Presented 05/19/2020 with increasing gait abnormality and needing more help with ADLs dragging his right leg and pocketing of his food as well as bouts of urinary incontinence. MRI of the brain showed severe white matter disease in a pattern consistent with multiple sclerosis numerous active demyelinating lesions throughout the supra and infratentorial brain. Admission chemistries unremarkable except glucose 111, SARS current virus negative. Neurology follow-up placed on IV Solu-Medrol x5 doses initiated 05/20/2020. Subcutaneous Lovenox for DVT prophylaxis. Tolerating a regular diet. Therapy evaluations completed and patient was admitted for a comprehensive rehab program.  SLP evaluation completed on 05/25/20 with results as follows:   Pt presents with no overt s/s of aspiration with solids or liquids.  His oral  phase was functional for containing and clearing boluses without leaving residue post swallow.  Pt endorses that at baseline he is a fast eater and sometimes he feels his mouth gets "stuffed."  But during presentations of solids and liquids during today's evaluation he felt that swallowing function was "normal."  Recommend that pt remain on his currently prescribed diet with set up assistance at meals given that pt benefits from adaptive utensils to accommodate for coordination deficits and tremor.    Pt also presents with grossly intact cognitive-linguistic functioning when baseline differences are taken into account.  His speech is slowed but he is fluent when conversing about his personal interests and exhibits no evidence of dysarthria or word finding impairment.  Pt also exhibits functional recall of daily information, safety awareness, awareness of his current limitations, problem solving, and attention to tasks.  Pt feels that his speech, language, and thinking are all "normal."    As a result, no further ST needs are indicated at this time.  All questions were answered to pt's satisfaction.     Skilled Therapeutic Interventions          Cognitive-linguistic and bedside swallow evaluation completed with results and recommendations reviewed with patient    SLP Assessment  Patient does not need any further Speech Lanaguage Pathology Services    Recommendations  SLP Diet Recommendations: Thin;Age appropriate regular solids Liquid Administration via: Cup;Straw Medication Administration: Whole meds with liquid Supervision: Patient able to self feed;Intermittent supervision to cue for compensatory strategies Compensations: Slow rate;Small sips/bites;Minimize environmental distractions Postural Changes and/or Swallow Maneuvers: Out of bed for meals;Seated upright 90 degrees;Upright 30-60 min after meal Oral Care Recommendations: Oral care BID Patient destination: Home Follow up Recommendations:  None Equipment Recommended: None recommended by SLP  Pain Pain Assessment Pain Scale: 0-10 Pain Score: 0-No pain  Prior Functioning Cognitive/Linguistic Baseline: Within functional limits Type of Home: Apartment  Lives With: Family Available Help at Discharge: Family;Available 24 hours/day  SLP Evaluation Cognition Overall Cognitive Status: Within Functional Limits for tasks assessed  Comprehension Auditory Comprehension Overall Auditory Comprehension: Appears within functional limits for tasks assessed Expression Expression Primary Mode of Expression: Verbal Verbal Expression Overall Verbal Expression: Appears within functional limits for tasks assessed Oral Motor Oral Motor/Sensory Function Overall Oral Motor/Sensory Function: Mild impairment Facial ROM: Reduced right Facial Symmetry: Abnormal symmetry right Facial Strength: Reduced right Facial Sensation: Within Functional Limits Lingual ROM: Within Functional Limits Lingual Symmetry: Within Functional Limits Lingual Strength: Within Functional Limits Lingual Sensation: Within Functional Limits  Care Tool Care Tool Cognition Expression of Ideas and Wants Expression of Ideas and Wants: Without difficulty (complex and basic) - expresses complex messages without difficulty and with speech that is clear and easy to understand   Understanding Verbal and Non-Verbal Content Understanding Verbal and Non-Verbal Content: Understands (complex and basic) - clear comprehension without cues or repetitions   Memory/Recall Ability *first 3 days only Memory/Recall Ability *first 3 days only: Current season;Location of own room;Staff names and faces;That he or she is in a hospital/hospital unit        Bedside Swallowing Assessment General Date of Onset: 05/21/20 Previous Swallow Assessment: BSE 05/21/20 Diet Prior to this Study: Other (Comment);Thin liquids (soft diet) Temperature Spikes Noted:  No Respiratory Status: Room air History of Recent Intubation: No Behavior/Cognition: Alert;Cooperative;Pleasant mood Oral Cavity - Dentition: Adequate natural dentition Self-Feeding Abilities: Able to feed self Vision: Functional for self-feeding Patient Positioning: Upright in chair/Tumbleform Baseline Vocal Quality: Normal  Oral Care Assessment   Ice Chips   Thin Liquid Thin Liquid: Within functional limits Nectar Thick   Honey Thick   Puree   Solid Solid: Within functional limits BSE Assessment Risk for Aspiration Impact on safety and function: Mild aspiration risk    Recommendations for other services: None   Discharge Criteria: Patient will be discharged from SLP if patient refuses treatment 3 consecutive times without medical reason, if treatment goals not met, if there is a change in medical status, if patient makes no progress towards goals or if patient is discharged from hospital.  The above assessment, treatment plan, treatment alternatives and goals were discussed and mutually agreed upon: by patient  Emilio Math 05/25/2020, 4:02 PM

## 2020-05-25 NOTE — Progress Notes (Signed)
Patient didn't sleep good. Reports taking melatonin at home. Jaime [name] A

## 2020-05-25 NOTE — Evaluation (Signed)
Physical Therapy Assessment and Plan  Patient Details  Name: Jaime [name] MRN: [id number] Date of Birth: April 19, 1996  PT Diagnosis: Abnormality of gait, Ataxia, Ataxic gait, Dizziness and giddiness, Impaired sensation and Muscle weakness Rehab Potential: Good ELOS: 7-10   Today's Date: 05/25/2020 PT Individual Time:1100  - 1205, 65 min      Hospital Problem: Principal Problem:   Multiple sclerosis exacerbation ([place])   Past Medical History:  Past Medical History:  Diagnosis Date  . Autism   . Depression   . MS (multiple sclerosis) ([place])    Past Surgical History: History reviewed. No pertinent surgical history.  Assessment & Plan Clinical Impression: Jaime [name] is a 24 year old right-handed male with history of documented mild autism, hypotension and recent diagnosis of multiple sclerosis diagnosed March 2021 followed by Dr.Yanas well as Dr. Felecia Shelling not on any immune modulating medications at this time.Well-known to rehab services from [place] exacerbation 12/17/2019 to 12/26/2019. Per chart review lives in a two-level home bed and bath upstairs 2 steps to entry. Needed assistance for tub transfers recently. Lives with his mother and 60 and 14 year old siblings. Presented 05/19/2020 with increasing gait abnormality and needing more help with ADLs dragging his right leg and pocketing of his food as well as bouts of urinary incontinence. MRI of the brain showed severe white matter disease in a pattern consistent with multiple sclerosis numerous active demyelinating lesions throughout the supra and infratentorial brain. Admission chemistries unremarkable except glucose 111, SARS current virus negative. Neurology follow-up placed on IV Solu-Medrol x5 doses initiated 05/20/2020. Subcutaneous Lovenox for DVT prophylaxis. Tolerating a regular diet.  Patient transferred to CIR on 05/24/2020 .   Patient currently requires max with mobility secondary to decreased cardiorespiratoy  endurance, ataxia and decreased coordination, decreased visual acuity and decreased sitting balance, decreased standing balance, decreased postural control and decreased balance strategies.  Prior to hospitalization, patient was independent  with mobility and lived with Family in a [place] home.  Home access is 2 without rail at back entrance, 4 wiht bil rail at front entrance, per ptStairs to enter.  Patient will benefit from skilled PT intervention to maximize safe functional mobility, minimize fall risk and decrease caregiver burden for planned discharge home with 24 hour supervision.  Anticipate patient will benefit from follow up OP at discharge.  PT - End of Session Activity Tolerance: Tolerates < 10 min activity with changes in vital signs Endurance Deficit: Yes Endurance Deficit Description: dizziness upon sitting up and standing up PT Assessment Rehab Potential (ACUTE/IP ONLY): Good PT Patient demonstrates impairments in the following area(s): Balance;Endurance;Sensory;Motor;Safety (safety regarding hand placement in w/c) PT Transfers Functional Problem(s): Bed Mobility;Bed to Chair;Car;Furniture PT Plan PT Intensity: Minimum of 1-2 x/day ,45 to 90 minutes PT Frequency: 5 out of 7 days PT Duration Estimated Length of Stay: 7-10 PT Treatment/Interventions: Ambulation/gait training;Discharge planning;DME/adaptive equipment instruction;Functional mobility training;Pain management;Psychosocial support;Splinting/orthotics;Therapeutic Activities;UE/LE Strength taining/ROM;Visual/perceptual remediation/compensation;Balance/vestibular training;Community reintegration;Neuromuscular re-education;Patient/family education;Stair training;Therapeutic Exercise;UE/LE Coordination activities;Wheelchair propulsion/positioning PT Transfers Anticipated Outcome(s): supervision for basic and car PT Locomotion Anticipated Outcome(s): S for gait x 50' in household, x 150' in controlled and community  environments, S up/down 4 steps bil rails for home entry and 12 steps R rail for accessing 2nd floor of apt, S w/c x 150' without cues for technique PT Recommendation Follow Up Recommendations: Outpatient PT Patient destination: Home Equipment Recommended: Rolling walker with 5" wheels Equipment Details: ? pt may own a RW   PT Evaluation Precautions/Restrictions Precautions Precautions: Fall Precaution Comments: hx  of POTS General   Vital SignsTherapy Vitals Temp: 98.3 F (36.8 C) Temp Source: Oral Pulse Rate: (!) 106 Resp: 16 BP: (!) 89/55 Patient Position (if appropriate): Sitting Oxygen Therapy SpO2: 100 % O2 Device: Room Air Patient Activity (if Appropriate): In chair Pain Pain Assessment Pain Scale: 0-10 Pain Score: 0-No pain Home Living/Prior Functioning Home Living Available Help at Discharge: Family;Available 24 hours/day Type of Home: Apartment Home Access: Stairs to enter Entrance Stairs-Number of Steps: 2 without rail at back entrance, 4 wiht bil rail at front entrance, per pt Entrance Stairs-Rails: Can reach both Home Layout: Two level;Bed/bath upstairs Alternate Level Stairs-Number of Steps: full flight Alternate Level Stairs-Rails: Right Bathroom Shower/Tub: Chiropodist: Standard Bathroom Accessibility: Yes  Lives With: Family Prior Function Level of Independence: Independent with basic ADLs;Independent with transfers;Independent with gait ("furniture walking", and had had several falls recently)  Able to Take Stairs?: Yes Driving: No Vocation: Volunteer work Comments: enjoys Proofreader, playing zelda, used to Psychologist, occupational at [employer] Vision/Perception  Wears glasses at all times.  C/o blurring of vision, R eye.  Pt was unclear is this was new or chronic.     Cognition Overall Cognitive Status: History of cognitive impairments - at baseline Arousal/Alertness: Awake/alert Orientation Level: Oriented X4 Selective Attention: Appears  intact Memory: Appears intact Immediate Memory Recall: Sock;Blue;Bed Memory Recall Sock: Without Cue Memory Recall Blue: Without Cue Memory Recall Bed: Without Cue Awareness: Appears intact Problem Solving: Impaired Problem Solving Impairment: Functional basic (difficulty figuring out where to place L hand to propel w/c, during 2nd session) Safety/Judgment: Appears intact Sensation Sensation Light Touch: Impaired Detail Central sensation comments: sock distribution bil LEs parasthesias Light Touch Impaired Details: Impaired LLE;Impaired RLE Proprioception: Appears Intact (tested bil ankles) Coordination Gross Motor Movements are Fluid and Coordinated: No Fine Motor Movements are Fluid and Coordinated: No Heel Shin Test: reduced speed, accuracy and excursion bil Motor  Motor Motor: Ataxia Motor - Skilled Clinical Observations: x 4 extremities   Trunk/Postural Assessment  Cervical Assessment Cervical Assessment: Within Functional Limits Thoracic Assessment Thoracic Assessment: Exceptions to [facility] (kyphotic posture in standing) Lumbar Assessment Lumbar Assessment: Exceptions to [facility] (posterior pelvic tilt) Postural Control Postural Control: Deficits on evaluation Righting Reactions: delayed and limited Protective Responses: delayed and limited  Balance Balance Balance Assessed: Yes Static Sitting Balance Static Sitting - Balance Support: Feet supported Static Sitting - Level of Assistance: 5: Stand by assistance Dynamic Sitting Balance Dynamic Sitting - Balance Support: Feet supported Dynamic Sitting - Level of Assistance: 5: Stand by assistance Static Standing Balance Static Standing - Balance Support: During functional activity;Bilateral upper extremity supported Static Standing - Level of Assistance: 4: Min assist Dynamic Standing Balance Dynamic Standing - Balance Support: Bilateral upper extremity supported;During functional activity Dynamic Standing - Level of  Assistance: 4: Min assist Extremity Assessment      RLE Assessment RLE Assessment: Exceptions to [facility] General Strength Comments: grossly in sitting: hip flexion, hip abduction, hip adduction, knee extension, knee flexion, ankle DF (mostly inversion) and ankle PF 4/5; tremors wiht all movements LLE Assessment LLE Assessment: Within Functional Limits General Strength Comments: tremors with all movements  Care Tool Care Tool Bed Mobility Roll left and right activity   Roll left and right assist level: Supervision/Verbal cueing    Sit to lying activity   Sit to lying assist level: Supervision/Verbal cueing    Lying to sitting edge of bed activity   Lying to sitting edge of bed assist level: Supervision/Verbal cueing  Care Tool Transfers Sit to stand transfer   Sit to stand assist level: Supervision/Verbal cueing    Chair/bed transfer    min assist     Toilet transfer        Car transfer    min assist      Care Tool Locomotion Ambulation  max assist 15'        Walk 10 feet activity    max assist 10'     Walk 50 feet with 2 turns activity     did not perform; unsafe due to dizziness    Walk 150 feet activity   did not perform; unsafe due to dizziness         Walk 10 feet on uneven surfaces activity       did not perform; unsafe due to dizziness    Stairs   Assist level: Minimal Assistance - Patient > 75% Stairs assistive device: 2 hand rails Max number of stairs: 4  Walk up/down 1 step activity          Walk up/down 4 steps activity Walk up/down 4 steps assist level: Minimal Assistance - Patient > 75% Walk up/down 4 steps assistive device: 2 hand rails  Walk up/down 12 steps activity     did not perform; unsafe due to dizziness    Pick up small objects from floor       did not perform; unsafe due to dizziness    Wheelchair    x 150' with supervision, cues for safe hand placement        Wheel 50 feet with 2 turns activity  supervision     Wheel 150 feet activity  superivsion     The above assessment, treatment plan, treatment alternatives and goals were discussed and mutually agreed upon: by patient  Refer to Care Plan for [place] 1- = LTGs due to ELOS    Recommendations for other services: None   Skilled Therapeutic Intervention Mobility Bed Mobility Bed Mobility: Rolling Right;Rolling Left;Supine to Sit;Sit to Supine Rolling Right: Supervision/verbal cueing Rolling Left: Supervision/Verbal cueing Supine to Sit: Supervision/Verbal cueing Sit to Supine: Supervision/Verbal cueing Transfers Transfers: Sit to Stand;Stand to Sit;Stand Pivot Transfers Sit to Stand: Contact Guard/Touching assist Stand to Sit: Contact Guard/Touching assist Stand Pivot Transfers: Minimal Assistance - Patient > 75% Stand Pivot Transfer Details: Verbal cues for technique;Manual facilitation for weight shifting Stand Pivot Transfer Details (indicate cue type and reason): ataxia bil UEs and bil LEs Transfer (Assistive device): None Locomotion  Gait Ambulation: Yes Gait Assistance: Maximal Assistance - Patient 25-49% Gait Distance (Feet): 15 Feet Assistive device: None Gait Assistance Details: Manual facilitation for weight shifting;Verbal cues for technique;Verbal cues for precautions/safety Gait Gait: Yes Gait Pattern: Impaired Gait Pattern: Step-through pattern;Lateral trunk lean to left;Narrow base of support;Decreased trunk rotation;Trunk flexed;Decreased weight shift to right Gait velocity: slow for age and gender Stairs / Additional Locomotion Stairs: Yes Stairs Assistance: Minimal Assistance - Patient > 75% Stair Management Technique: Two rails Number of Stairs: 4 Height of Stairs: 6 Wheelchair Mobility Wheelchair Mobility: Yes Wheelchair Assistance: Supervision/Verbal cueing (TCs and VCs for bil hand placement onto rims rather than spokes of wheels) Wheelchair Propulsion: Both upper  extremities Wheelchair Parts Management: Needs assistance Distance: 150   At end of session, pt seated in recliner with seat pad alarm set and needs at hand.   Discharge Criteria: Patient will be discharged from PT if patient refuses treatment 3 consecutive times without medical  reason, if treatment goals not met, if there is a change in medical status, if patient makes no progress towards goals or if patient is discharged from hospital.   Heddy Vidana 05/25/2020, 5:34 PM

## 2020-05-25 NOTE — Progress Notes (Deleted)
Physical Therapy Session Note  Patient Details  Name: Jaime [name] MRN: [id number] Date of Birth: 07-02-1996  Today's Date: 05/25/2020 PT Individual Time: 1445-1530 PT Individual Time Calculation (min): 45 min   Short Term Goals: Week 1:     Skilled Therapeutic Interventions/Progress Updates:  Pt seated in recliner.  He denied pain.  Stand pivot recliner> w/c with min/mod assist.  Stand pivot w/c> mat to L with min assist.  neuromuscular re-education via multimodal cues, in supine-  for bil bridging with adductor squeezes, bil lower trunk rotation, cervical flexion; in R/L side lying - for L/R hip abduction in hip and knee flexion. In standing with bil UE support- calf raises, limited by dizziness..  Rolling L><R on flat mat, no rails, supervision.  Supine> sit to R as per home set up, with supervision. Upon sitting up, pt with dizziness that resolved in 20 seconds.  W/c propulsion x 40' with supervision and tactile cues for L hand placement; pt tends to place hands on spokes rather than rims.  Gait training iwht RW on level tile x 50' including 2 turns, RW, min assist.  Cues for upright posture, forward gaze.  After resting, up/down 4 steps 2 rails with CGA, step-through method self selected.  Upon standing up, pt reported mild dizziness, but it decreased with time in upright position.    At end of session, pt in recliner with needs at hand and seat pad alarm set.     Therapy Documentation Precautions:  Precautions Precautions: Fall Precaution Comments: hx of POTS Restrictions Weight Bearing Restrictions: No   Vital Signs: Therapy Vitals Pulse Rate: 86 BP: 93/64, seated  Pain Assessment Pain Score: 0-No pain       Therapy/Group: Individual Therapy  Dyllin Gulley 05/25/2020, 3:00 PM

## 2020-05-25 NOTE — Progress Notes (Signed)
Occupational Therapy Session Note  Patient Details  Name: Jaime [name] MRN: [id number] Date of Birth: Feb 17, 1996  Today's Date: 05/25/2020 OT Individual Time: 1245-1315 OT Individual Time Calculation (min): 30 min    Short Term Goals: Week 1:  OT Short Term Goal 1 (Week 1): STG= LTG d/t ELOS  Skilled Therapeutic Interventions/Progress Updates:  Pt received sitting up in the recliner with no c/o pain, ready to eat lunch. Pt was assisted in cutting chicken 2/2 tremor making it difficult to complete more [facility] tasks. Pt was issued a weighted utensil to trial for self feeding to increase accuracy and reduce ataxic hand- mouth movement. Pt reported liking this utensil and had improved accuracy with meals. Pt also provided with mug-style cup to increase ease in drinking liquids. Pt given general edu re MS and common swallowing deficits. Pt was left sitting up with all needs met, chair alarm set.    Therapy Documentation Precautions:  Precautions Precautions: Fall Precaution Comments: hx of POTS Restrictions Weight Bearing Restrictions: No Pain: Pain Assessment Pain Scale: 0-10 Pain Score: 0-No pain  Therapy/Group: Individual Therapy  Curtis Sites 05/25/2020, 1:02 PM

## 2020-05-25 NOTE — Progress Notes (Signed)
[facility] PHYSICAL MEDICINE & REHABILITATION PROGRESS NOTE   Subjective/Complaints:  Wants melatonin for sleep which he uses at home.   ROS: Limited due to cognitive/behavioral   Objective:   No results found. No results for input(s): WBC, HGB, HCT, PLT in the last 72 hours. No results for input(s): NA, K, CL, CO2, GLUCOSE, BUN, CREATININE, CALCIUM in the last 72 hours.  Intake/Output Summary (Last 24 hours) at 05/25/2020 1049 Last data filed at 05/25/2020 0752 Gross per 24 hour  Intake 360 ml  Output 1525 ml  Net -1165 ml     Physical Exam: Vital Signs Blood pressure 102/60, pulse (!) 56, temperature 98.3 F (36.8 C), temperature source Oral, resp. rate 16, height 5\' 9"  (1.753 m), weight 53.7 kg, SpO2 96 %.  General: Alert and oriented x 3, No apparent distress HEENT: Head is normocephalic, atraumatic, PERRLA, EOMI, sclera anicteric, oral mucosa pink and moist, dentition intact, ext ear canals clear,  Neck: Supple without JVD or lymphadenopathy Heart: Reg rate and rhythm. No murmurs rubs or gallops Chest: CTA bilaterally without wheezes, rales, or rhonchi; no distress Abdomen: Soft, non-tender, non-distended, bowel sounds positive. Extremities: No clubbing, cyanosis, or edema. Pulses are 2+ Skin: Clean and intact without signs of breakdown Neuro: alert, cooperative. Follows basic commands. Fair insight. Doesn't initiate much. Normal language/monotone speech. Ataxic limbs with limited depth perception and fine motor coordination. Strength 5/5. Sensory exam intact.  Musculoskeletal: Full ROM, No pain with AROM or PROM in the neck, trunk, or extremities. Posture appropriate Psych: Pt's affect is appropriate. Pt is cooperative      Assessment/Plan: 1. Functional deficits secondary to MS exacerbation which require 3+ hours per day of interdisciplinary therapy in a comprehensive inpatient rehab setting.  Physiatrist is providing close team supervision and 24 hour management  of active medical problems listed below.  Physiatrist and rehab team continue to assess barriers to discharge/monitor patient progress toward functional and medical goals  Care Tool:  Bathing    Body parts bathed by patient: Right arm, Right lower leg, Left arm, Left lower leg, Chest, Face, Abdomen, Front perineal area, Buttocks, Right upper leg, Left upper leg         Bathing assist Assist Level: Minimal Assistance - Patient > 75%     Upper Body Dressing/Undressing Upper body dressing   What is the patient wearing?: Pull over shirt    Upper body assist Assist Level: Supervision/Verbal cueing    Lower Body Dressing/Undressing Lower body dressing      What is the patient wearing?: Underwear/pull up, Pants     Lower body assist Assist for lower body dressing: Minimal Assistance - Patient > 75%     Toileting Toileting    Toileting assist Assist for toileting: Minimal Assistance - Patient > 75% Assistive Device Comment: urinal   Transfers Chair/bed transfer  Transfers assist     Chair/bed transfer assist level: Minimal Assistance - Patient > 75%     Locomotion Ambulation   Ambulation assist              Walk 10 feet activity   Assist           Walk 50 feet activity   Assist           Walk 150 feet activity   Assist           Walk 10 feet on uneven surface  activity   Assist           Wheelchair  Assist               Wheelchair 50 feet with 2 turns activity    Assist            Wheelchair 150 feet activity     Assist          Blood pressure 102/60, pulse (!) 56, temperature 98.3 F (36.8 C), temperature source Oral, resp. rate 16, height 5\' 9"  (1.753 m), weight 53.7 kg, SpO2 96 %.  Medical Problem List and Plan: 1.Gait abnormality with ataxiasecondary to MS exacerbation. IV Solu-Medrol completed -patient may shower -ELOS/Goals: 5-7 days, mod I  -beginning  therapies today 2. Antithrombotics: -DVT/anticoagulation:Lovenox -antiplatelet therapy: N/A 3. Pain Management:Tylenol as needed 4. Mood:Provide emotional support -antipsychotic agents: N/A 5. Neuropsych: This patientiscapable of making decisions on hisown behalf.  -add melatonin for sleep 6. Skin/Wound Care:Routine skin checks 7. Fluids/Electrolytes/Nutrition:Routine in and outs with follow-up chemistrieson Monday. 8. High functioning autism. Stable at baseline 9.Dysautonomia orthostatic hypotension syndrome. ProAmatine 5 mg 3 times daily -monitor bp's with activity today 10. GERD: on protonix at home  -resume today    LOS: 1 days A FACE TO FACE EVALUATION WAS PERFORMED  Saturday 05/25/2020, 10:49 AM

## 2020-05-26 ENCOUNTER — Inpatient Hospital Stay (HOSPITAL_COMMUNITY): Payer: Medicaid Other | Admitting: Physical Therapy

## 2020-05-26 ENCOUNTER — Inpatient Hospital Stay (HOSPITAL_COMMUNITY): Payer: Medicaid Other

## 2020-05-26 ENCOUNTER — Inpatient Hospital Stay (HOSPITAL_COMMUNITY): Payer: Medicaid Other | Admitting: Occupational Therapy

## 2020-05-26 ENCOUNTER — Other Ambulatory Visit: Payer: Self-pay

## 2020-05-26 ENCOUNTER — Encounter (HOSPITAL_COMMUNITY): Payer: Self-pay | Admitting: Internal Medicine

## 2020-05-26 LAB — CBC WITH DIFFERENTIAL/PLATELET
Abs Immature Granulocytes: 0.07 10*3/uL (ref 0.00–0.07)
Basophils Absolute: 0 10*3/uL (ref 0.0–0.1)
Basophils Relative: 0 %
Eosinophils Absolute: 0 10*3/uL (ref 0.0–0.5)
Eosinophils Relative: 0 %
HCT: 39.5 % (ref 39.0–52.0)
Hemoglobin: 13.9 g/dL (ref 13.0–17.0)
Immature Granulocytes: 1 %
Lymphocytes Relative: 26 %
Lymphs Abs: 2.7 10*3/uL (ref 0.7–4.0)
MCH: 31.5 pg (ref 26.0–34.0)
MCHC: 35.2 g/dL (ref 30.0–36.0)
MCV: 89.6 fL (ref 80.0–100.0)
Monocytes Absolute: 0.8 10*3/uL (ref 0.1–1.0)
Monocytes Relative: 8 %
Neutro Abs: 6.6 10*3/uL (ref 1.7–7.7)
Neutrophils Relative %: 65 %
Platelets: 233 10*3/uL (ref 150–400)
RBC: 4.41 MIL/uL (ref 4.22–5.81)
RDW: 13.3 % (ref 11.5–15.5)
WBC: 10.2 10*3/uL (ref 4.0–10.5)
nRBC: 0 % (ref 0.0–0.2)

## 2020-05-26 LAB — COMPREHENSIVE METABOLIC PANEL
ALT: 14 U/L (ref 0–44)
AST: 14 U/L — ABNORMAL LOW (ref 15–41)
Albumin: 3.2 g/dL — ABNORMAL LOW (ref 3.5–5.0)
Alkaline Phosphatase: 26 U/L — ABNORMAL LOW (ref 38–126)
Anion gap: 10 (ref 5–15)
BUN: 15 mg/dL (ref 6–20)
CO2: 23 mmol/L (ref 22–32)
Calcium: 8.7 mg/dL — ABNORMAL LOW (ref 8.9–10.3)
Chloride: 106 mmol/L (ref 98–111)
Creatinine, Ser: 0.87 mg/dL (ref 0.61–1.24)
GFR calc Af Amer: >60 mL/min (ref >60)
GFR calc non Af Amer: >60 mL/min (ref >60)
Glucose, Bld: 87 mg/dL (ref 70–99)
Potassium: 3.4 mmol/L — ABNORMAL LOW (ref 3.5–5.1)
Sodium: 139 mmol/L (ref 135–145)
Total Bilirubin: 0.6 mg/dL (ref 0.3–1.2)
Total Protein: 5.4 g/dL — ABNORMAL LOW (ref 6.5–8.1)

## 2020-05-26 MED ORDER — POTASSIUM CHLORIDE CRYS ER 20 MEQ PO TBCR
40.0000 meq | EXTENDED_RELEASE_TABLET | Freq: Once | ORAL | Status: AC
  Administered 2020-05-26: 40 meq via ORAL
  Filled 2020-05-26: qty 2

## 2020-05-26 NOTE — Progress Notes (Signed)
Inpatient Rehabilitation  Patient information reviewed and entered into eRehab system by Mikeya Tomasetti M. Mykai Wendorf, M.A., CCC/SLP, PPS Coordinator.  Information including medical coding, functional ability and quality indicators will be reviewed and updated through discharge.    

## 2020-05-26 NOTE — Progress Notes (Signed)
Slept good after melatonin given. Jaime [name] A

## 2020-05-26 NOTE — Progress Notes (Signed)
Occupational Therapy Session Note  Patient Details  Name: Jaime [name] MRN: [id number] Date of Birth: 1996-03-16  Today's Date: 05/26/2020 OT Individual Time: 9485-4627 OT Individual Time Calculation (min): 80 min    Short Term Goals: Week 1:  OT Short Term Goal 1 (Week 1): STG= LTG d/t ELOS  Skilled Therapeutic Interventions/Progress Updates:    Pt received in bed eating his breakfast using the adaptive weighted fork.  He was agreeable to getting a shower.  Pt sat to EOB with S and then used RW to ambulate to bathroom with RW with CGA with good carryover of cuing from PT (kept head up, posture straight, body aligned with RW).  Min cues for stepping back far enough prior to sitting onto toilet, shower seat and recliner.   Using grab bars for support or RW for support, pt was able to adjust clothing over hips and standing for cleansing with just S.   At start of session his blood pressure was 119/80, after shower 99/77 but pt reported 2/10 dizziness.   He tended to move slowly at his own pace (ie sitting on toilet to have a BM or showering), but he was able to do all self care with close Supervision and CGA with ambulation.    Pt returned to recliner and therapist donned knee high TED hose.     Pt resting in recliner to prepare for lunch. All needs met and bed alarm set.     Therapy Documentation Precautions:  Precautions Precautions: Fall Precaution Comments: hx of POTS Restrictions Weight Bearing Restrictions: No     Pain: Pain Assessment Pain Scale: 0-10 Pain Score: 0-No pain ADL: ADL Eating: Set up, Minimal cueing Where Assessed-Eating: Chair Grooming: Setup Where Assessed-Grooming: Chair Upper Body Bathing: Supervision/safety Where Assessed-Upper Body Bathing: Shower Lower Body Bathing: Supervision/safety Where Assessed-Lower Body Bathing: Shower Upper Body Dressing: Setup Where Assessed-Upper Body Dressing: Chair Lower Body Dressing:  Supervision/safety Where Assessed-Lower Body Dressing: Chair Toileting: Supervision/safety Where Assessed-Toileting: Glass blower/designer: Therapist, music Method: Counselling psychologist: Energy manager: Contact guard, Minimal cueing Social research officer, government Method: Heritage manager: Shower seat with back, Grab bars  Therapy/Group: Individual Therapy  [place] 05/26/2020, 12:41 PM

## 2020-05-26 NOTE — Progress Notes (Signed)
Physical Therapy Session Note  Patient Details  Name: Jaime [name] MRN: [id number] Date of Birth: 1995/12/12  Today's Date: 05/26/2020 PT Individual Time: 1500-1555 PT Individual Time Calculation (min): 55 min    Skilled Therapeutic Interventions/Progress Updates:  PT attempted to see pt at 1PM but pt eating lunch & requested PT return later (PT offered to assist pt with cutting food but pt reports he is able to do it).  PT returned at [facility] & pt agreeable to tx. Pt with ted hose already donned & PT assisted with donning abdominal binder (it's too big so requested nursing staff order smaller one). Pt ambulates room<>gym with RW & min assist with total body ataxia. Once in gym pt notes slight dizziness that ceases with seated rest break, BP = 102/74 mmHg (LUE, sitting), HR = 99 bpm. Pt completes TUG with RW & min assist with pt demonstrating wide turns, see tug score below. PT educated pt on high fall risk as noted by tug & need to use RW for balance during mobility. Gait training with pt weaving between cones with RW & min assist with pt requiring rest break during first trial 2/2 dizziness (BP = 103/61 mmHg, HR = 90 bpmin LUE in sitting). Attempted task again with no c/o dizziness with pt able to make sharper turns. Pt performed 3" step taps to target with BUE support on RW & min assist with focus on weight shifting L<>R, BLE coordination, & dynamic balance; pt with occasional LOB with min assist to correct & pt leaning forward shifting RW forward. Pt performed 10x sit<>stand without BUE support with task focusing on BLE strengthening. Back in room, pt completes toilet transfer with RW, grab bar & CGA. Pt with continent void on toilet & requires assistance to pull pants up high enough for then him to pull them over hips. Pt completes hand hygiene standing at sink, leaning on sink, with min assist. Pt left in recliner with chair alarm donned, call bell & all needs in reach.  Therapy  Documentation Precautions:  Precautions Precautions: Fall Precaution Comments: hx of POTS Restrictions Weight Bearing Restrictions: No  Pain: Denies pain   Balance: Balance Balance Assessed: Yes Standardized Balance Assessment Standardized Balance Assessment: Timed Up and Go Test Timed Up and Go Test TUG: Normal TUG (with RW & min assist) Normal TUG (seconds): 52 (average of 3 trials (67 seconds, 47 seconds, 43 seconds))    Therapy/Group: Individual Therapy  Sandi Mariscal 05/26/2020, 4:01 PM

## 2020-05-26 NOTE — Care Management (Signed)
Inpatient Rehabilitation Center Individual Statement of Services  Patient Name:  Jaime [name]  Date:  05/26/2020  Welcome to the Inpatient Rehabilitation Center.  Our goal is to provide you with an individualized program based on your diagnosis and situation, designed to meet your specific needs.  With this comprehensive rehabilitation program, you will be expected to participate in at least 3 hours of rehabilitation therapies Monday-Friday, with modified therapy programming on the weekends.  Your rehabilitation program will include the following services:  Physical Therapy (PT), Occupational Therapy (OT), Speech Therapy (ST), 24 hour per day rehabilitation nursing, Therapeutic Recreaction (TR), Psychology, Neuropsychology, Care Coordinator, Rehabilitation Medicine, Nutrition Services, Pharmacy Services and Other  Weekly team conferences will be held on Tuesdays to discuss your progress.  Your Inpatient Rehabilitation Care Coordinator will talk with you frequently to get your input and to update you on team discussions.  Team conferences with you and your family in attendance may also be held.  Expected length of stay: 7-10 days   Overall anticipated outcome: Supervision  Depending on your progress and recovery, your program may change. Your Inpatient Rehabilitation Care Coordinator will coordinate services and will keep you informed of any changes. Your Inpatient Rehabilitation Care Coordinator's name and contact numbers are listed  below.  The following services may also be recommended but are not provided by the Inpatient Rehabilitation Center:   Driving Evaluations  Home Health Rehabiltiation Services  Outpatient Rehabilitation Services  Vocational Rehabilitation   Arrangements will be made to provide these services after discharge if needed.  Arrangements include referral to agencies that provide these services.  Your insurance has been verified to be:  Medicaid   Your  primary doctor is:  No PCP  Pertinent information will be shared with your doctor and your insurance company.  Inpatient Rehabilitation Care Coordinator:  Susie Cassette [phone] or ([phone]  Information discussed with and copy given to patient by: Gretchen Short, 05/26/2020, 11:01 AM

## 2020-05-26 NOTE — Plan of Care (Signed)
  Problem: Consults Goal: RH GENERAL PATIENT EDUCATION Description: See Patient Education module for education specifics. Outcome: Progressing   Problem: RH BOWEL ELIMINATION Goal: RH STG MANAGE BOWEL WITH ASSISTANCE Description: STG Manage Bowel with MOD I Assistance. Outcome: Progressing   Problem: RH BLADDER ELIMINATION Goal: RH STG MANAGE BLADDER WITH ASSISTANCE Description: STG Manage Bladder With MOD I Assistance Outcome: Progressing   Problem: RH SKIN INTEGRITY Goal: RH STG SKIN FREE OF INFECTION/BREAKDOWN Description: Skin will be free of infection/breakdown with min assist Outcome: Progressing Goal: RH STG MAINTAIN SKIN INTEGRITY WITH ASSISTANCE Description: STG Maintain Skin Integrity With min Assistance. Outcome: Progressing   Problem: RH SAFETY Goal: RH STG ADHERE TO SAFETY PRECAUTIONS W/ASSISTANCE/DEVICE Description: STG Adhere to Safety Precautions With supv Assistance/Device. Outcome: Progressing   Problem: RH PAIN MANAGEMENT Goal: RH STG PAIN MANAGED AT OR BELOW PT'S PAIN GOAL Description: Pain will be managed at or below a 3 out of 10 MOD I assist Outcome: Progressing   Problem: RH KNOWLEDGE DEFICIT GENERAL Goal: RH STG INCREASE KNOWLEDGE OF SELF CARE AFTER HOSPITALIZATION Description: Pt will be able to verbalize ways to remain physically independent with ADLs with supv assist Outcome: Progressing

## 2020-05-26 NOTE — Progress Notes (Signed)
Patient ID: Jaime [name], male   DOB: [birth year], 24 y.o.   MRN: [id number]  SW familiar with pt from previous admission in 12/2019. Pt primary caregiver is [name] [phone]). SW to follow-up with his mother to get insight on any changes at home.  SW made efforts to make contact with pt mother Jaime [name] but unable to leave message as voicemail is full. SW to continue to make efforts.   Cecile Sheerer, MSW, LCSWA Office: [phone] Cell: [phone] Fax: [phone]

## 2020-05-26 NOTE — Progress Notes (Signed)
Mom at bedside, reports patient doing a lot better. Concerned about ongoing BUE tremors. Patient wants scheduled melatonin at 2300. Alfredo Martinez A

## 2020-05-26 NOTE — Progress Notes (Addendum)
PHYSICAL MEDICINE & REHABILITATION PROGRESS NOTE   Subjective/Complaints:  Pt reports slept well with melatonin.  Denies pain.  RLE stil weak, but getting a little better.  Still feels vision hazy out of R eye- can now bend R great toe, which is new.    ROS: limited due to cognition  Objective:   No results found. Recent Labs    05/26/20 0525  WBC 10.2  HGB 13.9  HCT 39.5  PLT 233   Recent Labs    05/26/20 0525  NA 139  K 3.4*  CL 106  CO2 23  GLUCOSE 87  BUN 15  CREATININE 0.87  CALCIUM 8.7*    Intake/Output Summary (Last 24 hours) at 05/26/2020 0840 Last data filed at 05/26/2020 0700 Gross per 24 hour  Intake 360 ml  Output 850 ml  Net -490 ml     Physical Exam: Vital Signs Blood pressure 102/61, pulse (!) 59, temperature 98.5 F (36.9 C), temperature source Oral, resp. rate 16, height 5\' 9"  (1.753 m), weight 53.7 kg, SpO2 98 %.  General: alert, appropriate, flat affect, NAD; sitting up I bed HEENT: vision decreased/blurry out of R eye- even with EGs Neck: Supple without JVD or lymphadenopathy Heart: borderline bradycardia- regular rhythm Chest: CTA B/L- no W/R/R- good air movement Abdomen: soft, slightly distended; hypoactive, NT Extremities: No clubbing, cyanosis, or edema. Pulses are 2+ Skin: Clean and intact without signs of breakdown Neuro: alert, cooperative. Follows basic commands. Fair insight. Poor initiation- could be due to being autistic- flat/monotone speech/affect. Ataxic limbs with limited depth perception and fine motor coordination. Strength 5/5. Sensory exam intact.  Musculoskeletal: Full ROM, No pain with AROM or PROM in the neck, trunk, or extremities. Posture appropriate Psych: cordial, flat      Assessment/Plan: 1. Functional deficits secondary to MS exacerbation which require 3+ hours per day of interdisciplinary therapy in a comprehensive inpatient rehab setting.  Physiatrist is providing close team supervision  and 24 hour management of active medical problems listed below.  Physiatrist and rehab team continue to assess barriers to discharge/monitor patient progress toward functional and medical goals  Care Tool:  Bathing    Body parts bathed by patient: Right arm, Right lower leg, Left arm, Left lower leg, Chest, Face, Abdomen, Front perineal area, Buttocks, Right upper leg, Left upper leg         Bathing assist Assist Level: Minimal Assistance - Patient > 75%     Upper Body Dressing/Undressing Upper body dressing   What is the patient wearing?: Pull over shirt    Upper body assist Assist Level: Supervision/Verbal cueing    Lower Body Dressing/Undressing Lower body dressing      What is the patient wearing?: Underwear/pull up, Pants     Lower body assist Assist for lower body dressing: Minimal Assistance - Patient > 75%     Toileting Toileting    Toileting assist Assist for toileting: Independent with assistive device Assistive Device Comment: urinal   Transfers Chair/bed transfer  Transfers assist     Chair/bed transfer assist level: Minimal Assistance - Patient > 75%     Locomotion Ambulation   Ambulation assist      Assist level: Maximal Assistance - Patient 25 - 49% Assistive device: No Device Max distance: 15   Walk 10 feet activity   Assist     Assist level: Maximal Assistance - Patient 25 - 49% Assistive device: No Device   Walk 50 feet activity   Assist Walk  50 feet with 2 turns activity did not occur: Safety/medical concerns (dizziness)         Walk 150 feet activity   Assist Walk 150 feet activity did not occur: Safety/medical concerns (dizziness)         Walk 10 feet on uneven surface  activity   Assist Walk 10 feet on uneven surfaces activity did not occur: Safety/medical concerns (dizziness)         Wheelchair     Assist   Type of Wheelchair: Manual    Wheelchair assist level: Supervision/Verbal  cueing Max wheelchair distance: 150    Wheelchair 50 feet with 2 turns activity    Assist        Assist Level: Supervision/Verbal cueing   Wheelchair 150 feet activity     Assist      Assist Level: Supervision/Verbal cueing   Blood pressure 102/61, pulse (!) 59, temperature 98.5 F (36.9 C), temperature source Oral, resp. rate 16, height 5\' 9"  (1.753 m), weight 53.7 kg, SpO2 98 %.  Medical Problem List and Plan: 1.Gait abnormality with ataxiasecondary to MS exacerbation. IV Solu-Medrol completed -patient may shower -ELOS/Goals: 5-7 days, mod I  8/23- will need disease modifying agent after d.c  2. Antithrombotics: -DVT/anticoagulation:Lovenox -antiplatelet therapy: N/A 3. Pain Management:Tylenol as needed 4. Mood:Provide emotional support -antipsychotic agents: N/A 5. Neuropsych: This patientiscapable of making decisions on hisown behalf.  -add melatonin for sleep 6. Skin/Wound Care:Routine skin checks 7. Fluids/Electrolytes/Nutrition:Routine in and outs with follow-up chemistrieson Monday.  8/23- K+ is 3.4- low- will replete 8. High functioning autism. Stable at baseline 9.Dysautonomia orthostatic hypotension syndrome. ProAmatine 5 mg 3 times daily -monitor bp's with activity today  8/23- BP soft low 100s/60s- pulse 59 10. GERD: on protonix at home  -resume today 11. Hypokalemia  8/23- replete KCl x1 40 mEq-  12. Insomnia  8/23- restarted melatonin which is home medicine.   13. Constipation  8/23- LBM 4+ days ago- will check KUB-   LOS: 2 days A FACE TO FACE EVALUATION WAS PERFORMED  Jodilyn Giese 05/26/2020, 8:40 AM

## 2020-05-27 ENCOUNTER — Inpatient Hospital Stay (HOSPITAL_COMMUNITY): Payer: Medicaid Other | Admitting: Occupational Therapy

## 2020-05-27 ENCOUNTER — Inpatient Hospital Stay (HOSPITAL_COMMUNITY): Payer: Medicaid Other

## 2020-05-27 MED ORDER — MIDODRINE HCL 5 MG PO TABS
7.5000 mg | ORAL_TABLET | Freq: Three times a day (TID) | ORAL | Status: DC
  Administered 2020-05-27 – 2020-06-05 (×26): 7.5 mg via ORAL
  Filled 2020-05-27 (×27): qty 2

## 2020-05-27 NOTE — Progress Notes (Signed)
Patient returned to unit via wheelchair. Two assist to get back in bed. Denies pain. Bed low call bell with in reach.

## 2020-05-27 NOTE — IPOC Note (Signed)
Overall Plan of Care [facility]) Patient Details Name: Jaime [name] MRN: [id number] DOB: 20-Aug-1996  Admitting Diagnosis: Multiple sclerosis exacerbation [facility])  Hospital Problems: Principal Problem:   Multiple sclerosis exacerbation (HCC)     Functional Problem List: Nursing Pain, Bladder, Safety, Endurance  PT Balance, Endurance, Sensory, Motor, Safety (safety regarding hand placement in w/c)  OT Balance, Endurance, Vision, Motor, Safety  SLP    TR         Basic ADLs: OT Eating, Grooming, Bathing, Dressing, Toileting     Advanced  ADLs: OT       Transfers: PT Bed Mobility, Bed to Chair, Car, Occupational psychologist, Research scientist (life sciences): PT       Additional Impairments: OT None  SLP        TR      Anticipated Outcomes Item Anticipated Outcome  Self Feeding no goal set  Swallowing      Basic self-care  (S)  Toileting  (S)   Bathroom Transfers (S)  Bowel/Bladder  to be continent x 2  Transfers  supervision for basic and car  Locomotion  S for gait x 50' in household, x 150' in controlled and community environments, S up/down 4 steps bil rails for home entry and 12 steps R rail for accessing 2nd floor of apt, S w/c x 150' without cues for technique  Communication     Cognition     Pain  less than 3 out of 10  Safety/Judgment  to remain fall free while in rehab   Therapy Plan: PT Intensity: Minimum of 1-2 x/day ,45 to 90 minutes PT Frequency: 5 out of 7 days PT Duration Estimated Length of Stay: 7-10 OT Intensity: Minimum of 1-2 x/day, 45 to 90 minutes OT Frequency: 5 out of 7 days OT Duration/Estimated Length of Stay: 10-14 days     Due to the current state of emergency, patients may not be receiving their 3-hours of Medicare-mandated therapy.   Team Interventions: Nursing Interventions Patient/Family Education, Disease Management/Prevention, Discharge Planning, Bladder Management, Pain Management, Bowel Management  PT interventions  Ambulation/gait training, Discharge planning, DME/adaptive equipment instruction, Functional mobility training, Pain management, Psychosocial support, Splinting/orthotics, Therapeutic Activities, UE/LE Strength taining/ROM, Visual/perceptual remediation/compensation, Warden/ranger, Community reintegration, Neuromuscular re-education, Equities trader education, Museum/gallery curator, Therapeutic Exercise, UE/LE Coordination activities, Wheelchair propulsion/positioning  OT Interventions Warden/ranger, Discharge planning, Self Care/advanced ADL retraining, Therapeutic Activities, Functional mobility training, Patient/family education, Therapeutic Exercise, Visual/perceptual remediation/compensation, UE/LE Coordination activities, Disease mangement/prevention, Wheelchair propulsion/positioning, UE/LE Strength taining/ROM, Psychosocial support, Neuromuscular re-education, DME/adaptive equipment instruction, Community reintegration  SLP Interventions    TR Interventions    SW/CM Interventions Discharge Planning, Psychosocial Support, Patient/Family Education   Barriers to Discharge MD  Medical stability, Home enviroment access/loayout, Lack of/limited family support, Weight and Behavior  Nursing Incontinence    PT      OT Inaccessible home environment bedroom/bathroom upstairs  SLP      SW       Team Discharge Planning: Destination: PT-Home ,OT- Home , SLP-Home Projected Follow-up: PT-Outpatient PT, OT-  Outpatient OT, SLP-None Projected Equipment Needs: PT-Rolling walker with 5" wheels, OT- To be determined, SLP-None recommended by SLP Equipment Details: PT-? pt may own a RW, OT-  Patient/family involved in discharge planning: PT- Patient,  OT-Patient, SLP-Patient  MD ELOS: 10-14 days Medical Rehab Prognosis:  Excellent Assessment: Pt is a 24 yr old male with recent dx of new relapsing, remitting MS- here for MS exacerbation- needs a disease modifying agent  when  discharged- s/p IV steroids.   Goals- supervision to go home with mother and silbings    See Team Conference Notes for weekly updates to the plan of care

## 2020-05-27 NOTE — Progress Notes (Signed)
[place] PHYSICAL MEDICINE & REHABILITATION PROGRESS NOTE   Subjective/Complaints:  Pt reports "feels great'.  Had a great BM yesterday- feeling MUCH better afterwards.  Was "a relief".    DXA:JOINOMV due to cognition  Objective:   DG Abd 1 View  Result Date: 05/26/2020 CLINICAL DATA:  Constipation EXAM: ABDOMEN - 1 VIEW COMPARISON:  None. FINDINGS: The bowel gas pattern is normal. Colonic stool burden is within normal limits. No radio-opaque calculi or other significant radiographic abnormality are seen. IMPRESSION: Negative. Electronically Signed   By: Duanne Guess D.O.   On: 05/26/2020 09:34   Recent Labs    05/26/20 0525  WBC 10.2  HGB 13.9  HCT 39.5  PLT 233   Recent Labs    05/26/20 0525  NA 139  K 3.4*  CL 106  CO2 23  GLUCOSE 87  BUN 15  CREATININE 0.87  CALCIUM 8.7*    Intake/Output Summary (Last 24 hours) at 05/27/2020 0918 Last data filed at 05/27/2020 0439 Gross per 24 hour  Intake 399 ml  Output 1650 ml  Net -1251 ml     Physical Exam: Vital Signs Blood pressure 100/71, pulse (!) 55, temperature 98.6 F (37 C), resp. rate 18, height 5\' 9"  (1.753 m), weight 53.7 kg, SpO2 98 %.  General: alert, appropriate, sitting up in bed- after breakfast, NAD HEENT: vision blurry in R eye- with eyeglasses Neck: Supple without JVD or lymphadenopathy Heart: borderline bradycardia- regular rhythm Chest: CTA B/L- no W/R/R- good air movement Abdomen: Soft, NT, ND, (+)BS  Extremities: No clubbing, cyanosis, or edema. Pulses are 2+ Skin: Clean and intact without signs of breakdown Neuro: alert, cooperative. Follows basic commands. Fair insight. Poor initiation- could be due to being autistic- flat/monotone speech/affect. Ataxic limbs with limited depth perception and fine motor coordination. Strength 5/5. Sensory exam intact.  Musculoskeletal: Full ROM, No pain with AROM or PROM in the neck, trunk, or extremities. Posture appropriate Psych: cordial, flat      Assessment/Plan: 1. Functional deficits secondary to MS exacerbation which require 3+ hours per day of interdisciplinary therapy in a comprehensive inpatient rehab setting.  Physiatrist is providing close team supervision and 24 hour management of active medical problems listed below.  Physiatrist and rehab team continue to assess barriers to discharge/monitor patient progress toward functional and medical goals  Care Tool:  Bathing    Body parts bathed by patient: Right arm, Right lower leg, Left arm, Left lower leg, Chest, Face, Abdomen, Front perineal area, Buttocks, Right upper leg, Left upper leg         Bathing assist Assist Level: Supervision/Verbal cueing     Upper Body Dressing/Undressing Upper body dressing   What is the patient wearing?: Pull over shirt    Upper body assist Assist Level: Set up assist    Lower Body Dressing/Undressing Lower body dressing      What is the patient wearing?: Pants, Underwear/pull up     Lower body assist Assist for lower body dressing: Supervision/Verbal cueing     Toileting Toileting    Toileting assist Assist for toileting: Independent with assistive device Assistive Device Comment: urinal.   Transfers Chair/bed transfer  Transfers assist     Chair/bed transfer assist level: Contact Guard/Touching assist     Locomotion Ambulation   Ambulation assist      Assist level: Minimal Assistance - Patient > 75% Assistive device: Walker-rolling Max distance: 150 ft   Walk 10 feet activity   Assist  Assist level: Minimal Assistance - Patient > 75% Assistive device: Walker-rolling   Walk 50 feet activity   Assist Walk 50 feet with 2 turns activity did not occur: Safety/medical concerns (dizziness)  Assist level: Minimal Assistance - Patient > 75% Assistive device: Walker-rolling    Walk 150 feet activity   Assist Walk 150 feet activity did not occur: Safety/medical concerns  (dizziness)  Assist level: Minimal Assistance - Patient > 75% Assistive device: Walker-rolling    Walk 10 feet on uneven surface  activity   Assist Walk 10 feet on uneven surfaces activity did not occur: Safety/medical concerns (dizziness)         Wheelchair     Assist Will patient use wheelchair at discharge?: Yes Type of Wheelchair: Manual    Wheelchair assist level: Supervision/Verbal cueing Max wheelchair distance: 150    Wheelchair 50 feet with 2 turns activity    Assist        Assist Level: Supervision/Verbal cueing   Wheelchair 150 feet activity     Assist      Assist Level: Supervision/Verbal cueing   Blood pressure 100/71, pulse (!) 55, temperature 98.6 F (37 C), resp. rate 18, height 5\' 9"  (1.753 m), weight 53.7 kg, SpO2 98 %.  Medical Problem List and Plan: 1.Gait abnormality with ataxiasecondary to MS exacerbation. IV Solu-Medrol completed -patient may shower -ELOS/Goals: 5-7 days, mod I  8/23- will need disease modifying agent after d.c  2. Antithrombotics: -DVT/anticoagulation:Lovenox -antiplatelet therapy: N/A 3. Pain Management:Tylenol as needed 4. Mood:Provide emotional support -antipsychotic agents: N/A 5. Neuropsych: This patientiscapable of making decisions on hisown behalf.  -add melatonin for sleep 6. Skin/Wound Care:Routine skin checks 7. Fluids/Electrolytes/Nutrition:Routine in and outs with follow-up chemistrieson Monday.  8/23- K+ is 3.4- low- will replete 8. High functioning autism. Stable at baseline 9.Dysautonomia orthostatic hypotension syndrome. ProAmatine 5 mg 3 times daily -monitor bp's with activity today  8/23- BP soft low 100s/60s- pulse 59  8/24- Pulse 55; BP 100/70- con't Midodrine- if has any dizziness, might need to increase midodrine dose 10. GERD: on protonix at home  -resume today 11. Hypokalemia  8/23- replete KCl x1  40 mEq-  12. Insomnia  8/23- restarted melatonin which is home medicine.   13. Constipation  8/23- LBM 4+ days ago- will check KUB-   8/24- KUB was OK- had BM BEFORE KUB done.   LOS: 3 days A FACE TO FACE EVALUATION WAS PERFORMED  Lindwood Mogel 05/27/2020, 9:18 AM

## 2020-05-27 NOTE — Patient Care Conference (Signed)
Inpatient RehabilitationTeam Conference and Plan of Care Update Date: 05/27/2020   Time: 11:54 AM    Patient Name: Jaime [name]      Medical Record Number: [id number]  Date of Birth: [birth year] Sex: Male         Room/Bed: 4W10C/4W10C-01 Payor Info: Payor: MEDICAID [place] / Plan: MEDICAID [place] ACCESS / Product Type: *No Product type* /    Admit Date/Time:  05/24/2020 12:50 PM  Primary Diagnosis:  Multiple sclerosis exacerbation [facility])  Hospital Problems: Principal Problem:   Multiple sclerosis exacerbation [facility])    Expected Discharge Date: Expected Discharge Date: 06/05/20  Team Members Present: Physician leading conference: Dr. Genice Rouge Care Coodinator Present: Kennyth Arnold, RN, BSN, CRRN;Cecile Sheerer, LCSWA Nurse Present: Other (comment) Abundio Miu, RN) PT Present: Malachi Pro, PT OT Present: Earleen Newport, OT SLP Present: Colin Benton, SLP PPS Coordinator present : Edson Snowball, PT     Current Status/Progress Goal Weekly Team Focus  Bowel/Bladder   continenet of bowel & bladder, no BM since admission  normal elimination pattern  bowel meds if appropriate, continue to monitor   Swallow/Nutrition/ Hydration             ADL's   CGA ambulation to toilet and shower with RW; S standing with RW, supervision with self care  supevision with all self care and transfers, S dynamic standing balance  dynamic balance and LE strength to improve mobility for safe transfers, AE to compenstate for UE tremors, ADL training, pt/ family education   Mobility   minA gait ~100' with RW, min/modA bed mobility and transfers  Supervision  Balance, Coordination, BLE strength, DC prep   Communication             Safety/Cognition/ Behavioral Observations            Pain   no c/o pain on shift, has tylenol prn  remain pain free  assess & treat as needed   Skin   no skin issues  no new areas of skin break down  assess q shift     Discharge Planning:  Pt to d/c to home  with 24/7 care from mother. Mother cares for younger children, one of which is autistic as well.   Team Discussion: Continent B/B, patient has tremors. Patient prefers the weight utensils. Patient becomes mildly dizzy upon standing. Contact guard ambulation to bathroom with supervision goals for all self-care. Min/mod assist for bed mobility and transfers with supervision goals for all mobility goals.  Patient on target to meet rehab goals: yes  *See Care Plan and progress notes for long and short-term goals.   Revisions to Treatment Plan:  SLP eval ordered. Teaching Needs: Continue family education  Current Barriers to Discharge: Home enviroment access/layout, Lack of/limited family support and Weight  Possible Resolutions to Barriers: Scheduling to be changed to slightly later in the morning. Allows patient more time to eat breakfast as he is a slow eater. Have patient's mother come in for family education as often as she can however, she is limited due to young children in the home, one of which is also Autistic.     Medical Summary Current Status: no pain; tremors when standing or drinking per RN; LBM 8/23; continent of B/B- using urinal; no wounds; autistic  Barriers to Discharge: Decreased family/caregiver support;Home enviroment access/layout;Weight  Barriers to Discharge Comments: pt autistic- high functioning- constipation- tremors-- have SLP eval- 06/05/20 Possible Resolutions to [employer] Focus: OT- BP 90s/60s- didn't get into shower; mildly dizzy-  changing schedule since eats slow/no therapy to interfere. needs smaller abd binder- will order   Continued Need for Acute Rehabilitation Level of Care: The patient requires daily medical management by a physician with specialized training in physical medicine and rehabilitation for the following reasons: Direction of a multidisciplinary physical rehabilitation program to maximize functional independence : Yes Medical management  of patient stability for increased activity during participation in an intensive rehabilitation regime.: Yes Analysis of laboratory values and/or radiology reports with any subsequent need for medication adjustment and/or medical intervention. : Yes   I attest that I was present, lead the team conference, and concur with the assessment and plan of the team.   Tennis Must 05/27/2020, 5:36 PM

## 2020-05-27 NOTE — Progress Notes (Signed)
Orthopedic Tech Progress Note Patient Details:  Jaime [name] 02-03-1996 [id number] Patient was eating when ortho came by his room. Patient stated he wanted to finish eating before he put the abdominal binder on. RN can call ortho back if we need to apply. Ortho Devices Type of Ortho Device: Abdominal binder Ortho Device/Splint Interventions: Ordered   Post Interventions Patient Tolerated: Other (comment) Instructions Provided: Other (comment)   Michelle Piper 05/27/2020, 2:35 PM

## 2020-05-27 NOTE — Progress Notes (Signed)
Physical Therapy Session Note  Patient Details  Name: Jaime [name] MRN: [id number] Date of Birth: May 17, 1996  Today's Date: 05/27/2020 PT Individual Time: 1400-1459 PT Individual Time Calculation (min): 59 min  and Today's Date: 05/27/2020 PT Missed Time: 75 Minutes Missed Time Reason: Other (Comment) (pt eating)  Short Term Goals: Week 1:     Skilled Therapeutic Interventions/Progress Updates:     Pt eating in AM when PT attempts 1st session. PT offers to assist with eating as needed and pt refuses. PT comes back 30 minutes later and pt still eating. AM session deferred.  PM Session: Pt received seated in recliner and agreeable to therapy. No report of pain. Stand step transfer to [facility] with minA and no AD. WC transport to gym for time management. Pt performs SPT to mat table with minA. Pt then performs 20' ambulation multiple bouts, without AD. Pt requires min/modA due to unsteadiness on LOBs, somewhat improved when mirror placed for visual feedback and with verbal cues to increase stride length and hip/trunk extension.  Pt performs NMR for standing balance, strength, and activity tolerance, with Wii bowling activity. Pt requires CGA during activity but does not have any LOBs. Requires several seated rest breaks due to fatigue and BLEs somewhat supported by WC in standing. PT cues on posture and large amplitude movements with RUE for increased dynamic challenge.  Pt performs stand step transfer to recliner with minA. Left seated in recliner with alarm intact and all needs within reach.  Therapy Documentation Precautions:  Precautions Precautions: Fall Precaution Comments: hx of POTS Restrictions Weight Bearing Restrictions: No   Therapy/Group: Individual Therapy  Beau Fanny, PT, DPT 05/27/2020, 4:20 PM

## 2020-05-27 NOTE — Progress Notes (Signed)
Occupational Therapy Session Note  Patient Details  Name: Jaime [name] MRN: [id number] Date of Birth: 12/27/1995  Today's Date: 05/27/2020 OT Individual Time: 1517-6160 OT Individual Time Calculation (min): 54 min    Short Term Goals: Week 1:  OT Short Term Goal 1 (Week 1): STG= LTG d/t ELOS  Skilled Therapeutic Interventions/Progress Updates:    Pt greeted at time of session sitting up in bed with HOB elevated, finishing eating breakfast. Supine to sit EOB Supervision with bed features and sit to stand CGA, transferred to wheelchair in same manner with RW. Tremors present, worse in initial standing phase and improving during actual transfer. Brought to bathroom via wheelchair and performed stand pivot to toilet with grab bars with CGA, tremors present. Pt reported mild dizziness, BP 96/66 seated on toilet, decided to perform sink level washing instead of shower. SPT back to wheelchair CGA, sink level UB/LB bathing with supervision UB and CGA in standing with unilateral support on sink to wash buttocks/periarea. UB dress set up, LB dress CS/CGA when in standing to don over hips for safety. Declined oral hygiene. Brought via wheelchair to recliner and performed hand held transfer Min to recliner, set up with nintendo switch, alarm on, call bell in reach. Discussed home set up, B&B on second floor and mom "does not work" per pt so she may be available for 24/7 assist. Cleaned weighted fork for future meals, reviewed its use with the pt to decrease tremors.   Therapy Documentation Precautions:  Precautions Precautions: Fall Precaution Comments: hx of POTS Restrictions Weight Bearing Restrictions: No     Therapy/Group: Individual Therapy  Erasmo Score 05/27/2020, 11:30 AM

## 2020-05-28 ENCOUNTER — Inpatient Hospital Stay (HOSPITAL_COMMUNITY): Payer: Medicaid Other

## 2020-05-28 ENCOUNTER — Inpatient Hospital Stay (HOSPITAL_COMMUNITY): Payer: Medicaid Other | Admitting: Occupational Therapy

## 2020-05-28 MED ORDER — SORBITOL 70 % SOLN
30.0000 mL | Freq: Once | Status: AC
  Administered 2020-05-28: 30 mL via ORAL
  Filled 2020-05-28: qty 30

## 2020-05-28 NOTE — Progress Notes (Signed)
Occupational Therapy Session Note  Patient Details  Name: Jaime [name] MRN: [id number] Date of Birth: Sep 28, 1996  Today's Date: 05/28/2020 OT Individual Time: 7564-3329 OT Individual Time Calculation (min): 58 min    Short Term Goals: Week 1:  OT Short Term Goal 1 (Week 1): STG= LTG d/t ELOS  Skilled Therapeutic Interventions/Progress Updates:    Pt greeted at time of session supine in bed resting but agreeable to OT session, agreeable to shower. BP checked, 117/71 and 1/10 dizziness. SPT bed to wheelchair CGA with RW, brought to bathroom and stand pivot to toilet in same manner and to perform clothing management. Ambulated short distance to shower with hand held assist/Min for standing balance and transferred to shower seat CGA with grab bars. UB/LB bathing seated with Supervision overall despite tremors, pt able to wash buttocks in static standing with unilateral support, remaining LB bathing seated with supervision as well. Pt required verbal cues to sequence and attend to next body part, mild perseveration on washing hair. Ambulated to toilet with hand held assist where he sat on a dry towel to don underwear with CGA in standing to don over hips before walking with RW to recliner, SPT with CGA. UB dress set up, LB dress for pants CGA. Pt reports spillage when using urinal at bed level and this is why the bed was wet, stripped sheets for NT. Weighted utensil missing today, will get a new one but for time management cut up pancakes for ease of performing self feeding. Alarm on, call bell in reach.   Therapy Documentation Precautions:  Precautions Precautions: Fall Precaution Comments: hx of POTS Restrictions Weight Bearing Restrictions: No     Therapy/Group: Individual Therapy  Erasmo Score 05/28/2020, 12:37 PM

## 2020-05-28 NOTE — Progress Notes (Signed)
Physical Therapy Session Note  Patient Details  Name: Jaime [name] MRN: [id number] Date of Birth: Jan 09, 1996  Today's Date: 05/28/2020 PT Individual Time: 1030-1057 and 8891-6945 PT Individual Time Calculation (min): 27 min  And 70 min and Today's Date: 05/28/2020 PT Missed Time: 30 Minutes Missed Time Reason: Other (Comment) (pt eating)  Short Term Goals: Week 1:     Skilled Therapeutic Interventions/Progress Updates:     Upon PT entry, pt eating breakfast and requests time to finish. On follow up, pt seated in recliner and agreeable to therapy. No complaint of pain. Sit to stand and stand step transfer to toilet with minA and no AD. Pt then stands with BUE on RW for PT to assist with donning abdominal binder. Pt ambulates 150' x2 with RW. PT cues for upright gaze to improve posture and balance, increased proximity to RW for safety, decreased WB through RW for energy conservation, and increased gait speed to decrease risk for falls. Pt verbalizes increase in tremulousness/fatigue toward end of ambulation. Pt left seated in recliner with alarm intact and all needs within reach.  2nd Session: Pt received seated in recliner and agreeable to therapy. SPT to WC with minA. WC transport to day room for energy conservation. Pt performs NMR for standing balance and activity tolerance, performing cognitive and fine motor tasks while standing at high low table. Pt initially completes colored peg designs with picture for reference. Pt then performs lego building activity while standing on airex mat for increased balance challenge with PT cuing to ensure pt not leaning forward on table with hips to provide stability. Pt does not have any LOBs but does require several seated rest breaks.  Pt performs ambulation without AD to challenge balance and strength, ambulating 30' with PT providing min/modA at hips for safety and to facilitate lateral weight shifting. Pt then ambulates 100' with min/modA. Final  bout2 x200' with RW and CGA/minA, with tactile and verbal cues to decrease WB through BUEs for energy conservation. Pt left seated in WC with alarm intact and all needs within reach.  Therapy Documentation Precautions:  Precautions Precautions: Fall Precaution Comments: hx of POTS Restrictions Weight Bearing Restrictions: No    Therapy/Group: Individual Therapy  Beau Fanny, PT, DPT 05/28/2020, 12:53 PM

## 2020-05-28 NOTE — Progress Notes (Signed)
   Patient Details  Name: Jaime [name] MRN: [id number] Date of Birth: June 10, 1996  Today's Date: 05/28/2020  Hospital Problems: Principal Problem:   Multiple sclerosis exacerbation [facility])  Past Medical History:  Past Medical History:  Diagnosis Date  . Autism   . Depression   . MS (multiple sclerosis) (HCC)    Past Surgical History: History reviewed. No pertinent surgical history. Social History:  reports that he has never smoked. He has never used smokeless tobacco. He reports that he does not drink alcohol and does not use drugs.  Family / Support Systems Marital Status: Single Spouse/Significant Other: N/Jaime Children: N/Jaime Other Supports: mother- Jaime [name] [phone]) Anticipated Caregiver: mother Ability/Limitations of Caregiver: cares Caregiver Availability: 24/7 Family Dynamics: Pt lives in the home with his mother and two younger siblings: 29 y.o sistyer (autistic) and 57 y.o. sister  Social History Preferred language: English Religion:  Education: High school grad Read: Yes Write: Yes Employment Status: Disabled Marine scientist Issues: Denies Guardian/Conservator: N/Jaime   Abuse/Neglect Abuse/Neglect Assessment Can Be Completed: Yes Physical Abuse: Denies Verbal Abuse: Denies Sexual Abuse: Denies Exploitation of patient/patient's resources: Denies Self-Neglect: Denies  Emotional Status Pt's affect, behavior and adjustment status: Pt in good spirits at time of visit Recent Psychosocial Issues: Denies Psychiatric History: hx of depression Substance Abuse History: Denies  Patient / Family Perceptions, Expectations & Goals Pt/Family understanding of illness & functional limitations: Pt mother and mother has Jaime general understanding of care needs Premorbid pt/family roles/activities: supervision Anticipated changes in roles/activities/participation: supervision  Building surveyor: None Premorbid Home Care/DME Agencies:  None Transportation available at discharge: mother Resource referrals recommended: Neuropsychology  Discharge Planning Living Arrangements: Parent Support Systems: Parent Type of Residence: Private residence Insurance Resources: [employer] (specify county) Surveyor, quantity Resources: [employer] Financial Screen Referred: No Living Expenses: Psychologist, sport and exercise Management: Other (Comment) (mother) Does the patient have any problems obtaining your medications?: No Care Coordinator Barriers to Discharge: Decreased caregiver support, Lack of/limited family support Care Coordinator Anticipated Follow Up Needs: HH/OP Expected length of stay: 7-10 days  Clinical Impression SW spoke with pt mother to complete assessment. Pt mother states there are no changes at home. Mother states she is here every afternoon at 2pm. DME: RW and shower chair.   Jaime [name] Jaime [name] 05/28/2020, 12:29 PM

## 2020-05-28 NOTE — Progress Notes (Signed)
[place] PHYSICAL MEDICINE & REHABILITATION PROGRESS NOTE   Subjective/Complaints:  Pt reports would like some Sorbitol or something to get him to have another BM- is feeling constipated.   Has had mild dizziness with therapy up til yesterday- hasn't had therapy yet today, so don't know if increase in midodrine has been helpful yet.      UDJ:SHFWYOV due to cognition  Objective:   DG Abd 1 View  Result Date: 05/26/2020 CLINICAL DATA:  Constipation EXAM: ABDOMEN - 1 VIEW COMPARISON:  None. FINDINGS: The bowel gas pattern is normal. Colonic stool burden is within normal limits. No radio-opaque calculi or other significant radiographic abnormality are seen. IMPRESSION: Negative. Electronically Signed   By: Jaime [name] D.O.   On: 05/26/2020 09:34   Recent Labs    05/26/20 0525  WBC 10.2  HGB 13.9  HCT 39.5  PLT 233   Recent Labs    05/26/20 0525  NA 139  K 3.4*  CL 106  CO2 23  GLUCOSE 87  BUN 15  CREATININE 0.87  CALCIUM 8.7*    Intake/Output Summary (Last 24 hours) at 05/28/2020 0856 Last data filed at 05/28/2020 0732 Gross per 24 hour  Intake 562 ml  Output 850 ml  Net -288 ml     Physical Exam: Vital Signs Blood pressure 97/66, pulse 73, temperature 97.9 F (36.6 C), resp. rate 18, height 5\' 9"  (1.753 m), weight 53.7 kg, SpO2 97 %.  General: awake, appropriate, NAD HEENT: vision blurry in R eye- with eyeglasses Neck: Supple without JVD or lymphadenopathy Heart: RRR Chest: CTA B/L- no W/R/R- good air movement Abdomen: Soft, NT, ND, (+)BS  Extremities: No clubbing, cyanosis, or edema. Pulses are 2+ Skin: Clean and intact without signs of breakdown Neuro: tremor of UEs when reaching for things- so worse with intention-   alert, cooperative. Follows basic commands. Fair insight. Poor initiation- could be due to being autistic- flat/monotone speech/affect. Ataxic limbs with limited depth perception and fine motor coordination. Strength 5/5. Sensory exam  intact.  Musculoskeletal: Full ROM, No pain with AROM or PROM in the neck, trunk, or extremities. Posture appropriate Psych: cordial, flat     Assessment/Plan: 1. Functional deficits secondary to MS exacerbation which require 3+ hours per day of interdisciplinary therapy in a comprehensive inpatient rehab setting.  Physiatrist is providing close team supervision and 24 hour management of active medical problems listed below.  Physiatrist and rehab team continue to assess barriers to discharge/monitor patient progress toward functional and medical goals  Care Tool:  Bathing    Body parts bathed by patient: Right arm, Left arm, Chest, Abdomen, Front perineal area, Buttocks, Face         Bathing assist Assist Level: Supervision/Verbal cueing     Upper Body Dressing/Undressing Upper body dressing   What is the patient wearing?: Pull over shirt    Upper body assist Assist Level: Set up assist    Lower Body Dressing/Undressing Lower body dressing      What is the patient wearing?: Pants, Underwear/pull up     Lower body assist Assist for lower body dressing: Supervision/Verbal cueing     Toileting Toileting    Toileting assist Assist for toileting: Contact Guard/Touching assist Assistive Device Comment: urinal.   Transfers Chair/bed transfer  Transfers assist     Chair/bed transfer assist level: Contact Guard/Touching assist     Locomotion Ambulation   Ambulation assist      Assist level: Moderate Assistance - Patient 50 - 74%  Assistive device: No Device Max distance: 20'   Walk 10 feet activity   Assist     Assist level: Moderate Assistance - Patient - 50 - 74% Assistive device: No Device   Walk 50 feet activity   Assist Walk 50 feet with 2 turns activity did not occur: Safety/medical concerns (dizziness)  Assist level: Minimal Assistance - Patient > 75% Assistive device: Walker-rolling    Walk 150 feet activity   Assist Walk 150  feet activity did not occur: Safety/medical concerns (dizziness)  Assist level: Minimal Assistance - Patient > 75% Assistive device: Walker-rolling    Walk 10 feet on uneven surface  activity   Assist Walk 10 feet on uneven surfaces activity did not occur: Safety/medical concerns (dizziness)         Wheelchair     Assist Will patient use wheelchair at discharge?: Yes Type of Wheelchair: Manual    Wheelchair assist level: Supervision/Verbal cueing Max wheelchair distance: 150    Wheelchair 50 feet with 2 turns activity    Assist        Assist Level: Supervision/Verbal cueing   Wheelchair 150 feet activity     Assist      Assist Level: Supervision/Verbal cueing   Blood pressure 97/66, pulse 73, temperature 97.9 F (36.6 C), resp. rate 18, height 5\' 9"  (1.753 m), weight 53.7 kg, SpO2 97 %.  Medical Problem List and Plan: 1.Gait abnormality with ataxiasecondary to MS exacerbation. IV Solu-Medrol completed  8/25- tremors still notable- using heavy utensils -patient may shower -ELOS/Goals: 5-7 days, mod I  8/23- will need disease modifying agent after d.c  2. Antithrombotics: -DVT/anticoagulation:Lovenox -antiplatelet therapy: N/A 3. Pain Management:Tylenol as needed 4. Mood:Provide emotional support -antipsychotic agents: N/A 5. Neuropsych: This patientiscapable of making decisions on hisown behalf.  -add melatonin for sleep 6. Skin/Wound Care:Routine skin checks 7. Fluids/Electrolytes/Nutrition:Routine in and outs with follow-up chemistrieson Monday.  8/23- K+ is 3.4- low- will replete 8. High functioning autism. Stable at baseline 9.Dysautonomia orthostatic hypotension syndrome. ProAmatine 5 mg 3 times daily -monitor bp's with activity today  8/23- BP soft low 100s/60s- pulse 59  8/24- Pulse 55; BP 100/70- con't Midodrine- if has any dizziness, might need to increase  midodrine dose  8/25- increase to 7.5 mg TID due to dizziness per therapy  10. GERD: on protonix at home  -resume today 11. Hypokalemia  8/23- replete KCl x1 40 mEq-  12. Insomnia  8/23- restarted melatonin which is home medicine.   13. Constipation  8/23- LBM 4+ days ago- will check KUB-   8/24- KUB was OK- had BM BEFORE KUB done.  8/25- wants something else- ordered sorbitol to get him to go again.    LOS: 4 days A FACE TO FACE EVALUATION WAS PERFORMED  Jaime [name] 05/28/2020, 8:56 AM

## 2020-05-28 NOTE — Progress Notes (Signed)
Patient ID: Jaime [name], male   DOB: [birth year], 24 y.o.   MRN: [id number]  SW spoke with pt mother [name] [phone]) to provide updates from team conference, and d/c date 9/2. Mother will continue to provide 24/7 care for him.   Cecile Sheerer, MSW, LCSWA Office: [phone] Cell: [phone] Fax: [phone]

## 2020-05-29 ENCOUNTER — Inpatient Hospital Stay (HOSPITAL_COMMUNITY): Payer: Medicaid Other | Admitting: Occupational Therapy

## 2020-05-29 ENCOUNTER — Inpatient Hospital Stay (HOSPITAL_COMMUNITY): Payer: Medicaid Other

## 2020-05-29 MED ORDER — SENNA 8.6 MG PO TABS
1.0000 | ORAL_TABLET | Freq: Every day | ORAL | Status: DC
  Administered 2020-05-29 – 2020-05-30 (×2): 8.6 mg via ORAL
  Filled 2020-05-29 (×2): qty 1

## 2020-05-29 NOTE — Progress Notes (Signed)
Occupational Therapy Session Note  Patient Details  Name: Jaime [name] MRN: [id number] Date of Birth: August 02, 1996  Today's Date: 05/29/2020 OT Individual Time: 6389-3734 OT Individual Time Calculation (min): 59 min    Short Term Goals: Week 1:  OT Short Term Goal 1 (Week 1): STG= LTG d/t ELOS  Skilled Therapeutic Interventions/Progress Updates:    Pt greeted at time of session supine in bed resting agreeable to OT session, no underwear/pants and bed wet with urine. Pt states his tremors are making the urinal shake and cause spillage when trying to use at bed level. Discussed with RN and pt regarding sitting at EOB for urinal placement, but calling nursing staff if needs to stand to void to have gravity assist with urinating and hopefully prevent spillage. Supine to sit supervision, no dizziness. Ambulated to bathroom with CGA with RW and transferred to toilet in same manner with use of grab bar. Indep with hygiene. Ambulated short distance to shower and transferred to shower seat all with CS/CGA. Pt stating he does have shower seat and grab bar in shower at home. UB/LB bathing with supervision, verbal cues for safety and sequencing to wash next portion. Dried off in the same manner and donned underwear with Supervision, ambulated to recliner and transferred CGA. No dizziness. Donned shirt set up, pants with CS/CGA in standing to don over hips. Provided weighted fork for meals with visual cues for fork to remain in room, also spoke with dietary staff. Pt up in recliner with alarm on, call bell in reach.   Therapy Documentation Precautions:  Precautions Precautions: Fall Precaution Comments: hx of POTS Restrictions Weight Bearing Restrictions: No     Therapy/Group: Individual Therapy  Erasmo Score 05/29/2020, 12:27 PM

## 2020-05-29 NOTE — Progress Notes (Addendum)
Occupational Therapy Session Note  Patient Details  Name: Jaime [name] MRN: [id number] Date of Birth: 1996/05/02  Today's Date: 05/29/2020 OT Individual Time: 1520-1600 OT Individual Time Calculation (min): 40 min    Short Term Goals: Week 1:  OT Short Term Goal 1 (Week 1): STG= LTG d/t ELOS  Skilled Therapeutic Interventions/Progress Updates:    1:1. Pt received in recliner requiring increased time to log off of video game. Pt ambulates into.out of bathroom with CGA to sit onto toilet. Pt demo decreased stability lowering to low toilet and OT educated on use of BSC over toilet to raise the height. Pt verbalized understanding. Pt completes standing hand hygiene with S and hips stabilized on sink. Pt agreeable to outside mobility to simulate community reentry. Pt completes functional mobility over uneven surface with VC for RW proximity and looking ahead. Pt requires saeted rest break after 150' mobility with CGA. Exited session with pt seated in bed, exit alarm on and call light in reac  Therapy Documentation Precautions:  Precautions Precautions: Fall Precaution Comments: hx of POTS Restrictions Weight Bearing Restrictions: No General:   Vital Signs: Therapy Vitals Temp: 98.9 F (37.2 C) Pulse Rate: 89 Resp: 17 BP: 102/72 Patient Position (if appropriate): Sitting Oxygen Therapy SpO2: 96 % O2 Device: Room Air Pain:   ADL: ADL Eating: Set up, Minimal cueing Where Assessed-Eating: Chair Grooming: Setup Where Assessed-Grooming: Chair Upper Body Bathing: Supervision/safety Where Assessed-Upper Body Bathing: Shower Lower Body Bathing: Supervision/safety Where Assessed-Lower Body Bathing: Shower Upper Body Dressing: Setup Where Assessed-Upper Body Dressing: Chair Lower Body Dressing: Supervision/safety Where Assessed-Lower Body Dressing: Chair Toileting: Supervision/safety Where Assessed-Toileting: Teacher, adult education: Furniture conservator/restorer Method:  Proofreader: Acupuncturist: Contact guard, Minimal cueing Film/video editor Method: Designer, industrial/product: Information systems manager with back, Conservation officer, historic buildings   Exercises:   Other Treatments:     Therapy/Group: Individual Therapy  Shon Hale 05/29/2020, 4:11 PM

## 2020-05-29 NOTE — Progress Notes (Signed)
PHYSICAL MEDICINE & REHABILITATION PROGRESS NOTE   Subjective/Complaints:  Focus on phone today- not on food- breakfast in front of him- hasn't eaten yet- said eats >75% of meals daily.   Had a bladder accident this AM-due to timing- couldn't get urinal in place in time.   Said had decreased dizziness with therapy- 1/10 on scale at most now of dizziness.       ROS: limited by cognition  Objective:   No results found. No results for input(s): WBC, HGB, HCT, PLT in the last 72 hours. No results for input(s): NA, K, CL, CO2, GLUCOSE, BUN, CREATININE, CALCIUM in the last 72 hours.  Intake/Output Summary (Last 24 hours) at 05/29/2020 0915 Last data filed at 05/29/2020 0807 Gross per 24 hour  Intake 222 ml  Output 1200 ml  Net -978 ml     Physical Exam: Vital Signs Blood pressure 111/76, pulse (!) 109, temperature 98.2 F (36.8 C), resp. rate 17, height 5\' 9"  (1.753 m), weight 53.7 kg, SpO2 99 %.  General: awake, alert, watching videos on phone, NAD; food in front of him- hasn't eaten yet.  HEENT: vision blurry in R eye- with eyeglasses Neck: Supple without JVD or lymphadenopathy Heart: tachycardic; regular rhythm Chest: CTA B/L- no W/R/R- good air movement Abdomen: Soft, NT, ND, (+)BS  Extremities: No clubbing, cyanosis, or edema. Pulses are 2+ Skin: Clean and intact without signs of breakdown Neuro: tremor of UEs when reaching for things- so worse with intention-still notable   alert, cooperative. Follows basic commands. Fair insight. Poor initiation- could be due to being autistic- flat/monotone speech/affect. Ataxic limbs with limited depth perception and fine motor coordination. Strength 5/5. Sensory exam intact.  Musculoskeletal: Full ROM, No pain with AROM or PROM in the neck, trunk, or extremities. Posture appropriate Psych: laughing at videos    Assessment/Plan: 1. Functional deficits secondary to MS exacerbation which require 3+ hours per day of  interdisciplinary therapy in a comprehensive inpatient rehab setting.  Physiatrist is providing close team supervision and 24 hour management of active medical problems listed below.  Physiatrist and rehab team continue to assess barriers to discharge/monitor patient progress toward functional and medical goals  Care Tool:  Bathing    Body parts bathed by patient: Right arm, Left arm, Chest, Abdomen, Front perineal area, Buttocks, Face, Right upper leg, Left upper leg, Left lower leg, Right lower leg         Bathing assist Assist Level: Supervision/Verbal cueing     Upper Body Dressing/Undressing Upper body dressing   What is the patient wearing?: Pull over shirt    Upper body assist Assist Level: Set up assist    Lower Body Dressing/Undressing Lower body dressing      What is the patient wearing?: Pants, Underwear/pull up     Lower body assist Assist for lower body dressing: Contact Guard/Touching assist     Toileting Toileting    Toileting assist Assist for toileting: Contact Guard/Touching assist Assistive Device Comment: urinal.   Transfers Chair/bed transfer  Transfers assist     Chair/bed transfer assist level: Contact Guard/Touching assist     Locomotion Ambulation   Ambulation assist      Assist level: Minimal Assistance - Patient > 75% Assistive device: Walker-rolling Max distance: 150'   Walk 10 feet activity   Assist     Assist level: Minimal Assistance - Patient > 75% Assistive device: Walker-rolling   Walk 50 feet activity   Assist Walk 50 feet with 2  turns activity did not occur: Safety/medical concerns (dizziness)  Assist level: Minimal Assistance - Patient > 75% Assistive device: Walker-rolling    Walk 150 feet activity   Assist Walk 150 feet activity did not occur: Safety/medical concerns (dizziness)  Assist level: Minimal Assistance - Patient > 75% Assistive device: Walker-rolling    Walk 10 feet on uneven  surface  activity   Assist Walk 10 feet on uneven surfaces activity did not occur: Safety/medical concerns (dizziness)         Wheelchair     Assist Will patient use wheelchair at discharge?: Yes Type of Wheelchair: Manual    Wheelchair assist level: Supervision/Verbal cueing Max wheelchair distance: 150    Wheelchair 50 feet with 2 turns activity    Assist        Assist Level: Supervision/Verbal cueing   Wheelchair 150 feet activity     Assist      Assist Level: Supervision/Verbal cueing   Blood pressure 111/76, pulse (!) 109, temperature 98.2 F (36.8 C), resp. rate 17, height 5\' 9"  (1.753 m), weight 53.7 kg, SpO2 99 %.  Medical Problem List and Plan: 1.Gait abnormality with ataxiasecondary to MS exacerbation. IV Solu-Medrol completed  8/25- tremors still notable- using heavy utensils -patient may shower -ELOS/Goals: 5-7 days, mod I  8/23- will need disease modifying agent after d.c  2. Antithrombotics: -DVT/anticoagulation:Lovenox -antiplatelet therapy: N/A 3. Pain Management:Tylenol as needed 4. Mood:Provide emotional support -antipsychotic agents: N/A 5. Neuropsych: This patientiscapable of making decisions on hisown behalf.  -add melatonin for sleep 6. Skin/Wound Care:Routine skin checks 7. Fluids/Electrolytes/Nutrition:Routine in and outs with follow-up chemistrieson Monday.  8/23- K+ is 3.4- low- will replete 8. High functioning autism. Stable at baseline 9.Dysautonomia orthostatic hypotension syndrome. ProAmatine 5 mg 3 times daily -monitor bp's with activity today  8/23- BP soft low 100s/60s- pulse 59  8/24- Pulse 55; BP 100/70- con't Midodrine- if has any dizziness, might need to increase midodrine dose  8/25- increase to 7.5 mg TID due to dizziness per therapy   8/26- Pulse 109 this AM- not normal for him- usually 50s-60s- less dizziness- so will con't  midodrine 7.5 mg TID 10. GERD: on protonix at home  -resume today 11. Hypokalemia  8/23- replete KCl x1 40 mEq-  12. Insomnia  8/23- restarted melatonin which is home medicine.   13. Constipation  8/23- LBM 4+ days ago- will check KUB-   8/24- KUB was OK- had BM BEFORE KUB done.  8/25- wants something else- ordered sorbitol to get him to go again.    8/26- small BM last night- will be careful with adding bowel meds- will add just senokot 1 tab daily for now- hope helps him have BM regularly.   LOS: 5 days A FACE TO FACE EVALUATION WAS PERFORMED  Jaime [name] 05/29/2020, 9:15 AM

## 2020-05-29 NOTE — Progress Notes (Signed)
Physical Therapy Session Note  Patient Details  Name: Jaime [name] MRN: [id number] Date of Birth: 1995/10/13  Today's Date: 05/29/2020 PT Individual Time: 1572-6203 and 5597-4163 PT Individual Time Calculation (min): 26 min and 57 min  Short Term Goals: Week 1:     Skilled Therapeutic Interventions/Progress Updates:     Pt received seated in recliner and agreeable to therapy. No complaint of pain. Sit to stand with supervision and cues for positioning and body mechanics. Pt ambulates x2 bouts of 150' during session with RW and CGA. PT provides verbal and tactile cues to decrease WB through RW for energy conservation, and increasing R knee flexion during swing phase due to pt ambulating with RLE in near full extension.   Pt performs Nustep for endurance training as well as reciprocal coordination training. Pt completes 5:00 on workload of 4 and 5:00 on workload of 6 for total of 391 steps.  Pt left seated in recliner with alarm intact and all needs within reach.  2nd Session: Pt received seated on toilet and agreeable to therapy. Performs pericare independently. Stand step transfer to [facility] with CGA. PT assists with upper and lower body dressing. WC transport to gym for time management. Pt performs "bear crawl" transfer onto mat table with minA from PT. Pt performs activities in high kneeling to strengthen core and improve balance. Pt makes colored ping pong ball patterns with min cuing from PT. Following seated rest breaks, pt performs same activity in quadruped for increased core activation and WB through BUEs. Pt able to switch back and forth from RUE to LUE, but does require several seated rest breaks during activity.   SPT back to [facility] and stand step transfer from [facility] to recliner without AD and modA due to LOB. Pt left seated in recliner with alarm intact and all needs within reach.  Therapy Documentation Precautions:  Precautions Precautions: Fall Precaution Comments: hx of  POTS Restrictions Weight Bearing Restrictions: No  Therapy/Group: Individual Therapy  Beau Fanny, PT, DPT 05/29/2020, 4:03 PM

## 2020-05-30 ENCOUNTER — Inpatient Hospital Stay (HOSPITAL_COMMUNITY): Payer: Medicaid Other | Admitting: Occupational Therapy

## 2020-05-30 ENCOUNTER — Inpatient Hospital Stay (HOSPITAL_COMMUNITY): Payer: Medicaid Other

## 2020-05-30 LAB — BASIC METABOLIC PANEL
Anion gap: 12 (ref 5–15)
BUN: 7 mg/dL (ref 6–20)
CO2: 23 mmol/L (ref 22–32)
Calcium: 9.8 mg/dL (ref 8.9–10.3)
Chloride: 100 mmol/L (ref 98–111)
Creatinine, Ser: 0.88 mg/dL (ref 0.61–1.24)
GFR calc Af Amer: >60 mL/min (ref >60)
GFR calc non Af Amer: >60 mL/min (ref >60)
Glucose, Bld: 109 mg/dL — ABNORMAL HIGH (ref 70–99)
Potassium: 3.8 mmol/L (ref 3.5–5.1)
Sodium: 135 mmol/L (ref 135–145)

## 2020-05-30 LAB — CBC
HCT: 44.3 % (ref 39.0–52.0)
Hemoglobin: 15.2 g/dL (ref 13.0–17.0)
MCH: 31 pg (ref 26.0–34.0)
MCHC: 34.3 g/dL (ref 30.0–36.0)
MCV: 90.2 fL (ref 80.0–100.0)
Platelets: 229 10*3/uL (ref 150–400)
RBC: 4.91 MIL/uL (ref 4.22–5.81)
RDW: 14 % (ref 11.5–15.5)
WBC: 24.6 10*3/uL — ABNORMAL HIGH (ref 4.0–10.5)
nRBC: 0 % (ref 0.0–0.2)

## 2020-05-30 MED ORDER — SORBITOL 70 % SOLN
30.0000 mL | Freq: Once | Status: AC
  Administered 2020-05-30: 30 mL via ORAL
  Filled 2020-05-30: qty 30

## 2020-05-30 MED ORDER — PIPERACILLIN-TAZOBACTAM 3.375 G IVPB
3.3750 g | Freq: Three times a day (TID) | INTRAVENOUS | Status: DC
  Administered 2020-05-31 (×3): 3.375 g via INTRAVENOUS
  Filled 2020-05-30 (×4): qty 50

## 2020-05-30 MED ORDER — METOPROLOL TARTRATE 5 MG/5ML IV SOLN
2.5000 mg | Freq: Once | INTRAVENOUS | Status: DC
  Filled 2020-05-30: qty 5

## 2020-05-30 MED ORDER — SENNA 8.6 MG PO TABS
2.0000 | ORAL_TABLET | Freq: Every day | ORAL | Status: DC
  Administered 2020-05-31 – 2020-06-01 (×2): 17.2 mg via ORAL
  Filled 2020-05-30 (×2): qty 2

## 2020-05-30 MED ORDER — VANCOMYCIN HCL 1250 MG/250ML IV SOLN
1250.0000 mg | Freq: Once | INTRAVENOUS | Status: AC
  Administered 2020-05-30: 1250 mg via INTRAVENOUS
  Filled 2020-05-30: qty 250

## 2020-05-30 MED ORDER — VANCOMYCIN HCL 500 MG/100ML IV SOLN
500.0000 mg | Freq: Three times a day (TID) | INTRAVENOUS | Status: DC
  Administered 2020-05-31: 500 mg via INTRAVENOUS
  Filled 2020-05-30 (×4): qty 100

## 2020-05-30 NOTE — Significant Event (Addendum)
Rapid Response Event Note   Reason for Call :  Called d/t T-101.4, HR-140s.   Initial Focused Assessment:  Pt sitting up in chair, alert and oriented. Pt says he feels "great."  Pt denies chest pain/SOB. Lungs CTA. Skin hot to touch.  T-101.4, HR-125, SBP-104, RR-18, SpO2-98% on RA.      Interventions:  Tylenol given at 1838 2.5mg  metoprolol ordered PTA RRT-held d/t soft BPs New PIV inserted EKG-ST CBC/BMP PCXR UA/cx  Plan of Care:  Metoprolol not given at this time since BPs have been on the softer side and pt takes midodrine TID. HR already down from 140s to 120s 15 min after tylenol administration. Will give tylenol a little while longer to work to see if tachycardia is fever driven. Last lab values are  from 8/23. CBC/BMP ordered.   RN notified MD of this plan: CXR, U/A, urine cx ordered as well d/t unclear source of fever.   RRT will reevaluate VS after seeing another pt.    Update: 2015>T-101.4, HR-101, BP-97/62, RR-19, SpO2-97% on RA. WBC-10.2>24.6. Await CXR results and relay all results to MD. Collect U/A, urine cx as soon as possible.  0330-T-103.1, SBP-80s. Pt neuro status unchanged. 500cc NS bolus ordered and RN to give tylenol.   Event Summary:   MD Notified: Dr. Carlis Abbott notified by bedside RN prior to and after my arrival  Call Time:1836 Arrival Time:1845 End Time:1905  Terrilyn Saver, RN

## 2020-05-30 NOTE — Progress Notes (Signed)
Notified Dr. Carlis Abbott of EKG results and new VS post tylenol. Rapid RN came to see pt. EKG results = ST, temp still 101.4 HR 122. IV site placed to RFA. Pt stable, orders placed for labs. Rapid RN to assess another unit pt and will come back to see pt and assess for need for metoprolol IV due to soft bps, will cont to monitor.  Ross Ludwig, RN

## 2020-05-30 NOTE — Progress Notes (Signed)
   05/30/20 1839  Assess: MEWS Score  Temp (!) 101.4 F (38.6 C)  BP 107/65  Pulse Rate (!) 134  Resp 20  SpO2 100 %  Assess: MEWS Score  MEWS Temp 1  MEWS Systolic 0  MEWS Pulse 3  MEWS RR 0  MEWS LOC 0  MEWS Score 4  MEWS Score Color Red  Assess: if the MEWS score is Yellow or Red  Were vital signs taken at a resting state? Yes  Focused Assessment Change from prior assessment (see assessment flowsheet)  Early Detection of Sepsis Score *See Row Information* Low  MEWS guidelines implemented *See Row Information* Yes  Treat  MEWS Interventions Administered scheduled meds/treatments;Administered prn meds/treatments;Escalated (See documentation below) (contacted on call)  Pain Scale 0-10  Pain Score 0  Take Vital Signs  Increase Vital Sign Frequency  Red: Q 1hr X 4 then Q 4hr X 4, if remains red, continue Q 4hrs  Escalate  MEWS: Escalate Red: discuss with charge nurse/RN and provider, consider discussing with RRT  Notify: Charge Nurse/RN  Name of Charge Nurse/RN Notified Khai Arrona (myself)  Notify: Provider  Provider Name/Title Raulkar  Date Provider Notified 05/30/20  Time Provider Notified 1840  Notification Type Call  Notification Reason Change in status  Response See new orders  Date of Provider Response 05/30/20  Time of Provider Response 1840  Notify: Rapid Response  Date Rapid Response Notified 05/30/20  Time Rapid Response Notified 1842  Document  Progress note created (see row info) Yes

## 2020-05-30 NOTE — Progress Notes (Signed)
Occupational Therapy Session Note  Patient Details  Name: Jaime [name] MRN: [id number] Date of Birth: 12-Jan-1996  Today's Date: 05/30/2020 OT Individual Time: [phone] and 1400-1459 OT Individual Time Calculation (min): 59 min and 59 min   Short Term Goals: Week 1:  OT Short Term Goal 1 (Week 1): STG= LTG d/t ELOS  Skilled Therapeutic Interventions/Progress Updates:    Pt greeted in bed with no c/o pain, requesting to start session by eating his breakfast. Supine<sit completed unassisted with HOB raised. Worked on sitting balance and UE coordination while eating his meal EOB. Min A for setup of condiments due to tremors, pt able to manage his utensils 75% of the time during food consumption without weighted fork, noted pronator grasp pattern. Vcs for using weighted fork for remainder of meal to increase accuracy when piercing potatoes. OT decreased degrees of freedom when bringing fork to mouth while he ate his oatmeal to minimize spillage. Pt throughout meal needed increased time to chew and also time to self regulate using small blue ball. Setup for donning overhead shirt and Min A for standing balance when pulling pants over hips. Pt remained in bed at end of session, left with all needs within reach and bed alarm set. OT retrieved a new weighted mug for his beverage and set him up with this on his bedside table.   2nd Session 1:1 tx (59 min) Pt greeted in his recliner, eating his peaches with juice spilled onto his bedside table. Educated pt on decreasing degrees of freedom to decrease spillage. Afterwards he was agreeable to shower. CGA for ambulatory transfer to shower chair using the RW, pt with ataxic LE movements. He doffed clothing at sit<stand level using the grab bar, Min A for removing the Rt sock. He then bathed also sit<stand, using grab bar for support. CGA for dynamic balance when bilateral UEs were engaged in a specific task, close supervision for balance when he had  unilateral support on the grab bar. Educated pt to thread the Rt LE first into LB garments to increase ease of meet task demands of dressing. Assistance for donning his abdominal binder, setup for overhead shirt. Close supervision for dynamic sitting balance with pt using the wall for trunk support at times when donning footwear. He stood at the sink to engage in oral care with Min A for pressing the activation button of his electronic toothbrush. Pt stabilized trunk using the sink, supervision for balance. At end of session pt ambulated back to the recliner and was left with all needs within reach and chair alarm set. Pt reported his dizziness level was "mild" during session and manageable during session.   Therapy Documentation Precautions:  Precautions Precautions: Fall Precaution Comments: hx of POTS Restrictions Weight Bearing Restrictions: No Pain: no c/o pain during 2nd session   ADL: ADL Eating: Set up, Minimal cueing Where Assessed-Eating: Chair Grooming: Setup Where Assessed-Grooming: Chair Upper Body Bathing: Supervision/safety Where Assessed-Upper Body Bathing: Shower Lower Body Bathing: Supervision/safety Where Assessed-Lower Body Bathing: Shower Upper Body Dressing: Setup Where Assessed-Upper Body Dressing: Chair Lower Body Dressing: Supervision/safety Where Assessed-Lower Body Dressing: Chair Toileting: Supervision/safety Where Assessed-Toileting: Teacher, adult education: Furniture conservator/restorer Method: Proofreader: Acupuncturist: Contact guard, Minimal cueing Film/video editor Method: Designer, industrial/product: Shower seat with back, Grab bars      Therapy/Group: Individual Therapy  Ary Lavine A Julio Storr 05/30/2020, 12:33 PM

## 2020-05-30 NOTE — Progress Notes (Signed)
   05/30/20 1909  Assess: MEWS Score  Temp (!) 101.4 F (38.6 C)  BP 101/73  Pulse Rate (!) 120  Resp 18  SpO2 98 %  O2 Device Room Air  Assess: MEWS Score  MEWS Temp 1  MEWS Systolic 0  MEWS Pulse 2  MEWS RR 0  MEWS LOC 0  MEWS Score 3  MEWS Score Color Yellow  Assess: if the MEWS score is Yellow or Red  Were vital signs taken at a resting state? Yes  Focused Assessment No change from prior assessment  Early Detection of Sepsis Score *See Row Information* Medium  MEWS guidelines implemented *See Row Information* No, previously red, continue vital signs every 4 hours  Document  Patient Outcome Stabilized after interventions  Progress note created (see row info) Yes

## 2020-05-30 NOTE — Progress Notes (Signed)
[place] PHYSICAL MEDICINE & REHABILITATION PROGRESS NOTE   Subjective/Complaints:  Pt reports ate all breakfast except oatmeal- doing now.   LBM 2 days ago- wants to go again- wil increase senokot to 2 tabs and add Sorbitol today x1.   Showing OT his video games.    ROS: limited by congition  Objective:   No results found. No results for input(s): WBC, HGB, HCT, PLT in the last 72 hours. No results for input(s): NA, K, CL, CO2, GLUCOSE, BUN, CREATININE, CALCIUM in the last 72 hours.  Intake/Output Summary (Last 24 hours) at 05/30/2020 1240 Last data filed at 05/30/2020 0800 Gross per 24 hour  Intake 596 ml  Output 100 ml  Net 496 ml     Physical Exam: Vital Signs Blood pressure 97/61, pulse 94, temperature 98.4 F (36.9 C), resp. rate 20, height 5\' 9"  (1.753 m), weight 53.7 kg, SpO2 97 %.  General: ate all but oatmeal which he's about to eat- OT in room; showing her videos game; NAD  HEENT: vision blurry in R eye- eyeglasses- wearing Neck: Supple without JVD or lymphadenopathy Heart: RRR- rate in 90s-  Chest: CTA B/L- no W/R/R- good air movement Abdomen: Soft, NT, ND, (+)BS hypoactive BS Extremities: No clubbing, cyanosis, or edema. Pulses are 2+ Skin: Clean and intact without signs of breakdown Neuro: tremor of UEs when reaching for things- so worse with intention-still notable- but a little less  alert, cooperative. Follows basic commands. Fair insight. Poor initiation- could be due to being autistic- flat/monotone speech/affect. Ataxic limbs with limited depth perception and fine motor coordination. Strength 5/5. Sensory exam intact.  Musculoskeletal: Full ROM, No pain with AROM or PROM in the neck, trunk, or extremities. Posture appropriate Psych:smiling at video game    Assessment/Plan: 1. Functional deficits secondary to MS exacerbation which require 3+ hours per day of interdisciplinary therapy in a comprehensive inpatient rehab setting.  Physiatrist is  providing close team supervision and 24 hour management of active medical problems listed below.  Physiatrist and rehab team continue to assess barriers to discharge/monitor patient progress toward functional and medical goals  Care Tool:  Bathing    Body parts bathed by patient: Right arm, Left arm, Chest, Abdomen, Front perineal area, Buttocks, Face, Right upper leg, Left upper leg, Left lower leg, Right lower leg         Bathing assist Assist Level: Supervision/Verbal cueing     Upper Body Dressing/Undressing Upper body dressing   What is the patient wearing?: Pull over shirt    Upper body assist Assist Level: Set up assist    Lower Body Dressing/Undressing Lower body dressing      What is the patient wearing?: Pants     Lower body assist Assist for lower body dressing: Contact Guard/Touching assist     Toileting Toileting    Toileting assist Assist for toileting: Contact Guard/Touching assist Assistive Device Comment: urinal.   Transfers Chair/bed transfer  Transfers assist     Chair/bed transfer assist level: Contact Guard/Touching assist     Locomotion Ambulation   Ambulation assist      Assist level: Minimal Assistance - Patient > 75% Assistive device: Walker-rolling Max distance: 150'   Walk 10 feet activity   Assist     Assist level: Minimal Assistance - Patient > 75% Assistive device: Walker-rolling   Walk 50 feet activity   Assist Walk 50 feet with 2 turns activity did not occur: Safety/medical concerns (dizziness)  Assist level: Minimal Assistance - Patient >  75% Assistive device: Walker-rolling    Walk 150 feet activity   Assist Walk 150 feet activity did not occur: Safety/medical concerns (dizziness)  Assist level: Minimal Assistance - Patient > 75% Assistive device: Walker-rolling    Walk 10 feet on uneven surface  activity   Assist Walk 10 feet on uneven surfaces activity did not occur: Safety/medical concerns  (dizziness)         Wheelchair     Assist Will patient use wheelchair at discharge?: Yes Type of Wheelchair: Manual    Wheelchair assist level: Supervision/Verbal cueing Max wheelchair distance: 150    Wheelchair 50 feet with 2 turns activity    Assist        Assist Level: Supervision/Verbal cueing   Wheelchair 150 feet activity     Assist      Assist Level: Supervision/Verbal cueing   Blood pressure 97/61, pulse 94, temperature 98.4 F (36.9 C), resp. rate 20, height 5\' 9"  (1.753 m), weight 53.7 kg, SpO2 97 %.  Medical Problem List and Plan: 1.Gait abnormality with ataxiasecondary to MS exacerbation. IV Solu-Medrol completed  8/25- tremors still notable- using heavy utensils -patient may shower -ELOS/Goals: 5-7 days, mod I  8/23- will need disease modifying agent after d.c  2. Antithrombotics: -DVT/anticoagulation:Lovenox -antiplatelet therapy: N/A 3. Pain Management:Tylenol as needed 4. Mood:Provide emotional support -antipsychotic agents: N/A 5. Neuropsych: This patientiscapable of making decisions on hisown behalf.  -add melatonin for sleep 6. Skin/Wound Care:Routine skin checks 7. Fluids/Electrolytes/Nutrition:Routine in and outs with follow-up chemistrieson Monday.  8/23- K+ is 3.4- low- will replete  8/27- labs Monday- every week while here 8. High functioning autism. Stable at baseline 9.Dysautonomia orthostatic hypotension syndrome. ProAmatine 5 mg 3 times daily -monitor bp's with activity today  8/23- BP soft low 100s/60s- pulse 59  8/24- Pulse 55; BP 100/70- con't Midodrine- if has any dizziness, might need to increase midodrine dose  8/25- increase to 7.5 mg TID due to dizziness per therapy   8/26- Pulse 109 this AM- not normal for him- usually 50s-60s- less dizziness- so will con't midodrine 7.5 mg TID  8/27- BP 97/60s- denies dizziness this AM- will  monitor 10. GERD: on protonix at home  -resume today 11. Hypokalemia  8/23- replete KCl x1 40 mEq-  8/27- labs qMonday  12. Insomnia  8/23- restarted melatonin which is home medicine.   13. Constipation  8/23- LBM 4+ days ago- will check KUB-   8/24- KUB was OK- had BM BEFORE KUB done.  8/25- wants something else- ordered sorbitol to get him to go again.    8/26- small BM last night- will be careful with adding bowel meds- will add just senokot 1 tab daily for now- hope helps him have BM regularly.   8/27- gave sorbitol today and increased senokot to 2 tabs daily.   LOS: 6 days A FACE TO FACE EVALUATION WAS PERFORMED  Lacoya Wilbanks 05/30/2020, 12:40 PM

## 2020-05-30 NOTE — Progress Notes (Signed)
Physical Therapy Session Note  Patient Details  Name: Jaime [name] MRN: [id number] Date of Birth: [birth year]  Today's Date: 05/30/2020 PT Individual Time: 1005-1115 PT Individual Time Calculation (min): 70 min   Short Term Goals: Week 1:     Skilled Therapeutic Interventions/Progress Updates:     Pt received seated in recliner and reports episode of urine incontinence. Sit to stand and stand step transfer to toilet with CGA and RW. Pt then performs multiple sit to stand transfers with supervision and holds onto grab bar with LUE, using RUE to assist with washing peri area as well as BLEs. PT provides modA for upper and lower body dressing. Pt ambulates 150' to gym with RW and minA. Pt demos ataxic gait pattern with very little push off at terminal stance with RLE, forward flexed posture, and significant weight through BUEs. Pt requires several brief standing rest breaks and reports light headedness toward end of ambulation. Pt performs sit to supine on mat table with supervision. BP taken at 88/65. Supine to sit with supervision. PT dons abdominal binder and BP taken at 95/77. Pt ambulates 13' with RW and minA with similar ataxic gait pattern. Pt verbalizes need to urinate. WC transport back to room. Stand step transfer to toilet with CGA. Pt left seated in recliner with alarm intact and all needs within reach.  Therapy Documentation Precautions:  Precautions Precautions: Fall Precaution Comments: hx of POTS Restrictions Weight Bearing Restrictions: No    Therapy/Group: Individual Therapy  Beau Fanny, PT, DPT 05/30/2020, 7:55 AM

## 2020-05-30 NOTE — Progress Notes (Signed)
Pharmacy Antibiotic Note  Jaime [name] is a 24 y.o. male admitted on 05/24/2020 with Fever of unknown origin.  Pharmacy has been consulted for vancomycin/Zosyn dosing.  Plan:  Vancomycin 1250 mg IV x 1, followed by 500 mg IV every 8 hours.  Goal trough 15-20 mcg/mL. Zosyn 3.375g IV q8h (4 hour infusion). Monitor clinical progress, cultures/sensitivities, renal function, abx plan Vancomycin trough as indicated   Height: 5\' 9"  (175.3 cm) Weight: 53.7 kg (118 lb 6.2 oz) IBW/kg (Calculated) : 70.7  Temp (24hrs), Avg:100.1 F (37.8 C), Min:98.4 F (36.9 C), Max:101.4 F (38.6 C)  Recent Labs  Lab 05/26/20 0525 05/30/20 1929  WBC 10.2 24.6*  CREATININE 0.87 0.88    Estimated Creatinine Clearance: 99.2 mL/min (by C-G formula based on SCr of 0.88 mg/dL).    No Known Allergies  Antimicrobials this admission: 8/27 vancomycin >>  8/27 Zosyn >>   Dose adjustments this admission:  Microbiology results: 8/27 BCx: sent 8/27 UCx: sent   Thank you for allowing 9/27 to participate in this patients care.   [place], PharmD Please see amion for complete clinical pharmacist phone list. 05/30/2020 9:33 PM

## 2020-05-30 NOTE — Progress Notes (Signed)
Called by RN Carlena Sax about patient having HR of 144 with MEWS 5. Patient is alert and responsive. Requested that she call rapid response, ordered low dose IV lopressor (BP soft) and stat EKG

## 2020-05-31 LAB — URINALYSIS, ROUTINE W REFLEX MICROSCOPIC
Bilirubin Urine: NEGATIVE
Glucose, UA: NEGATIVE mg/dL
Ketones, ur: NEGATIVE mg/dL
Nitrite: NEGATIVE
Protein, ur: NEGATIVE mg/dL
RBC / HPF: >50 RBC/hpf — ABNORMAL HIGH (ref 0–5)
Specific Gravity, Urine: 1.019 (ref 1.005–1.030)
WBC, UA: >50 WBC/hpf — ABNORMAL HIGH (ref 0–5)
pH: 6 (ref 5.0–8.0)

## 2020-05-31 LAB — CBC WITH DIFFERENTIAL/PLATELET
Abs Immature Granulocytes: 0.28 10*3/uL — ABNORMAL HIGH (ref 0.00–0.07)
Basophils Absolute: 0.1 10*3/uL (ref 0.0–0.1)
Basophils Relative: 0 %
Eosinophils Absolute: 0 10*3/uL (ref 0.0–0.5)
Eosinophils Relative: 0 %
HCT: 48.6 % (ref 39.0–52.0)
Hemoglobin: 16.6 g/dL (ref 13.0–17.0)
Immature Granulocytes: 1 %
Lymphocytes Relative: 5 %
Lymphs Abs: 1.3 10*3/uL (ref 0.7–4.0)
MCH: 31.6 pg (ref 26.0–34.0)
MCHC: 34.2 g/dL (ref 30.0–36.0)
MCV: 92.4 fL (ref 80.0–100.0)
Monocytes Absolute: 2.1 10*3/uL — ABNORMAL HIGH (ref 0.1–1.0)
Monocytes Relative: 9 %
Neutro Abs: 20.3 10*3/uL — ABNORMAL HIGH (ref 1.7–7.7)
Neutrophils Relative %: 85 %
Platelets: 197 10*3/uL (ref 150–400)
RBC: 5.26 MIL/uL (ref 4.22–5.81)
RDW: 14.6 % (ref 11.5–15.5)
WBC: 24.1 10*3/uL — ABNORMAL HIGH (ref 4.0–10.5)
nRBC: 0 % (ref 0.0–0.2)

## 2020-05-31 MED ORDER — ACETAMINOPHEN 325 MG PO TABS
650.0000 mg | ORAL_TABLET | ORAL | Status: DC | PRN
  Administered 2020-05-31 – 2020-06-02 (×4): 650 mg via ORAL
  Filled 2020-05-31 (×4): qty 2

## 2020-05-31 MED ORDER — SODIUM CHLORIDE 0.9 % IV SOLN: INTRAVENOUS | Status: DC | PRN

## 2020-05-31 MED ORDER — CEPHALEXIN 250 MG PO CAPS
500.0000 mg | ORAL_CAPSULE | Freq: Two times a day (BID) | ORAL | Status: DC
  Administered 2020-05-31 – 2020-06-05 (×10): 500 mg via ORAL
  Filled 2020-05-31 (×10): qty 2

## 2020-05-31 MED ORDER — ACETAMINOPHEN 650 MG RE SUPP: 650.0000 mg | Freq: Four times a day (QID) | RECTAL | Status: DC | PRN

## 2020-05-31 MED ORDER — SODIUM CHLORIDE 0.9 % IV BOLUS: 500.0000 mL | Freq: Once | INTRAVENOUS | Status: DC | PRN

## 2020-05-31 NOTE — Progress Notes (Signed)
Pt's Mother is with patient, updated on status. Mother has voiced some concerns regarding discharge and was directed to speak with the SW

## 2020-05-31 NOTE — Progress Notes (Signed)
   05/30/20 2016  Vitals  Temp (!) 101.4 F (38.6 C)  BP 97/62  MAP (mmHg) 72  BP Location Left Arm  BP Method Automatic  Patient Position (if appropriate) Lying  Pulse Rate (!) 101  Pulse Rate Source Monitor  Resp 19  MEWS COLOR  MEWS Score Color Yellow  Oxygen Therapy  SpO2 97 %  O2 Device Room Air  Pain Assessment  Pain Scale 0-10  Pain Score 0  MEWS Score  MEWS Temp 1  MEWS Systolic 1  MEWS Pulse 1  MEWS RR 0  MEWS LOC 0  MEWS Score 3  Provider Notification  Provider Name/Title Raulker  Date Provider Notified 05/30/20  Time Provider Notified 2020  Notification Type Call  Notification Reason Other (Comment) (update on labs & vitals)  Response See new orders  Date of Provider Response 05/30/20  Time of Provider Response 2020  Rapid Response Notification  Name of Rapid Response RN Notified  (Mindy stopped by to reassess patient, she wasn't notified)

## 2020-05-31 NOTE — Progress Notes (Addendum)
PHYSICAL MEDICINE & REHABILITATION PROGRESS NOTE   Subjective/Complaints: Watching TV and laughing.  Started on medications to assist with BM Febrile overnight: started on vanc and zosyn. UA positive. Culture pending. CXR negative.   ROS: limited by congition  Objective:   DG Chest 2 View  Result Date: 05/30/2020 CLINICAL DATA:  Fever EXAM: CHEST - 2 VIEW COMPARISON:  05/20/2020 FINDINGS: The heart size and mediastinal contours are within normal limits. Both lungs are clear. The visualized skeletal structures are unremarkable. IMPRESSION: Negative Electronically Signed   By: Charlett Nose M.D.   On: 05/30/2020 20:48   Recent Labs    05/30/20 1929  WBC 24.6*  HGB 15.2  HCT 44.3  PLT 229   Recent Labs    05/30/20 1929  NA 135  K 3.8  CL 100  CO2 23  GLUCOSE 109*  BUN 7  CREATININE 0.88  CALCIUM 9.8    Intake/Output Summary (Last 24 hours) at 05/31/2020 1513 Last data filed at 05/31/2020 0800 Gross per 24 hour  Intake 713.5 ml  Output 100 ml  Net 613.5 ml     Physical Exam: Vital Signs Blood pressure 100/65, pulse 89, temperature 98.6 F (37 C), temperature source Oral, resp. rate 18, height 5\' 9"  (1.753 m), weight 53.7 kg, SpO2 100 %.  General: Alert, No apparent distress HEENT: Head is normocephalic, atraumatic, PERRLA, EOMI, sclera anicteric, oral mucosa pink and moist, dentition intact, ext ear canals clear,  Neck: Supple without JVD or lymphadenopathy Heart: Reg rate and rhythm. No murmurs rubs or gallops Chest: CTA bilaterally without wheezes, rales, or rhonchi; no distress Abdomen: Soft, non-tender, non-distended, bowel sounds positive. Extremities: No clubbing, cyanosis, or edema. Pulses are 2+ Skin: Clean and intact without signs of breakdown Neuro: tremor of UEs when reaching for things- so worse with intention-still notable- but a little less  alert, cooperative. Follows basic commands. Fair insight. Poor initiation- could be due to being  autistic- flat/monotone speech/affect. Ataxic limbs with limited depth perception and fine motor coordination. Strength 5/5. Sensory exam intact.  Musculoskeletal: Full ROM, No pain with AROM or PROM in the neck, trunk, or extremities. Posture appropriate Psych: laughing at TV   Assessment/Plan: 1. Functional deficits secondary to MS exacerbation which require 3+ hours per day of interdisciplinary therapy in a comprehensive inpatient rehab setting.  Physiatrist is providing close team supervision and 24 hour management of active medical problems listed below.  Physiatrist and rehab team continue to assess barriers to discharge/monitor patient progress toward functional and medical goals  Care Tool:  Bathing    Body parts bathed by patient: Right arm, Left arm, Chest, Abdomen, Front perineal area, Buttocks, Face, Right upper leg, Left upper leg, Left lower leg, Right lower leg         Bathing assist Assist Level: Supervision/Verbal cueing     Upper Body Dressing/Undressing Upper body dressing   What is the patient wearing?: Pull over shirt    Upper body assist Assist Level: Set up assist    Lower Body Dressing/Undressing Lower body dressing      What is the patient wearing?: Pants, Underwear/pull up     Lower body assist Assist for lower body dressing: Contact Guard/Touching assist     Toileting Toileting    Toileting assist Assist for toileting: Contact Guard/Touching assist Assistive Device Comment: urinal.   Transfers Chair/bed transfer  Transfers assist     Chair/bed transfer assist level: Contact Guard/Touching assist     Locomotion Ambulation  Ambulation assist      Assist level: Minimal Assistance - Patient > 75% Assistive device: Walker-rolling Max distance: 150'   Walk 10 feet activity   Assist     Assist level: Minimal Assistance - Patient > 75% Assistive device: Walker-rolling   Walk 50 feet activity   Assist Walk 50 feet with  2 turns activity did not occur: Safety/medical concerns (dizziness)  Assist level: Minimal Assistance - Patient > 75% Assistive device: Walker-rolling    Walk 150 feet activity   Assist Walk 150 feet activity did not occur: Safety/medical concerns (dizziness)  Assist level: Minimal Assistance - Patient > 75% Assistive device: Walker-rolling    Walk 10 feet on uneven surface  activity   Assist Walk 10 feet on uneven surfaces activity did not occur: Safety/medical concerns (dizziness)         Wheelchair     Assist Will patient use wheelchair at discharge?: Yes Type of Wheelchair: Manual    Wheelchair assist level: Supervision/Verbal cueing Max wheelchair distance: 150    Wheelchair 50 feet with 2 turns activity    Assist        Assist Level: Supervision/Verbal cueing   Wheelchair 150 feet activity     Assist      Assist Level: Supervision/Verbal cueing   Blood pressure 100/65, pulse 89, temperature 98.6 F (37 C), temperature source Oral, resp. rate 18, height 5\' 9"  (1.753 m), weight 53.7 kg, SpO2 100 %.  Medical Problem List and Plan: 1.Gait abnormality with ataxiasecondary to MS exacerbation. IV Solu-Medrol completed  8/25- tremors still notable- using heavy utensils -patient may shower -ELOS/Goals: 5-7 days, mod I  8/23- will need disease modifying agent after d.c   -Continue CIR 2. Antithrombotics: -DVT/anticoagulation:Lovenox- discontinued as ambulating >150 feet -antiplatelet therapy: N/A 3. Pain Management:Tylenol as needed. Well controlled.  4. Mood:Provide emotional support -antipsychotic agents: N/A 5. Neuropsych: This patientiscapable of making decisions on hisown behalf.  -add melatonin for sleep 6. Skin/Wound Care:Routine skin checks 7. Fluids/Electrolytes/Nutrition:Routine in and outs with follow-up chemistrieson Monday.  8/23- K+ is 3.4- low- will replete  8/27-  labs Monday- every week while here 8. High functioning autism. Stable at baseline 9.Dysautonomia orthostatic hypotension syndrome. ProAmatine 5 mg 3 times daily -monitor bp's with activity today  8/23- BP soft low 100s/60s- pulse 59  8/24- Pulse 55; BP 100/70- con't Midodrine- if has any dizziness, might need to increase midodrine dose  8/25- increase to 7.5 mg TID due to dizziness per therapy   8/26- Pulse 109 this AM- not normal for him- usually 50s-60s- less dizziness- so will con't midodrine 7.5 mg TID  8/27- BP 97/60s- denies dizziness this AM- will monitor 10. GERD: on protonix at home  -resume today 11. Hypokalemia  8/23- replete KCl x1 40 mEq-  8/27- labs qMonday  12. Insomnia  8/23- restarted melatonin which is home medicine.   13. Constipation  8/23- LBM 4+ days ago- will check KUB-   8/24- KUB was OK- had BM BEFORE KUB done.  8/25- wants something else- ordered sorbitol to get him to go again.    8/26- small BM last night- will be careful with adding bowel meds- will add just senokot 1 tab daily for now- hope helps him have BM regularly.   8/27- gave sorbitol today and increased senokot to 2 tabs daily.  14. Febrile, tachycardic to 134, hypotensive, rising WBC, MEWS 5 on 8/27: UA positive for UTI. Vanc and Zosyn started, narrow to Keflex since fever has resolved.  Urine culture pending. CXR negative. Repeat CBC continues to have elevated WBC- repeat 8/29 am.   >35 minutes spent in examination of patient, discussion of MEWS 5 with nurse, review of UA, CXR, discontinuation of vanc and Zosyn and starting of Kelfex, assessment of BP, HR, review of notes, review of EKG  LOS: 7 days A FACE TO FACE EVALUATION WAS PERFORMED  Jaime [name] 05/31/2020, 3:13 PM

## 2020-05-31 NOTE — Progress Notes (Addendum)
x

## 2020-05-31 NOTE — Progress Notes (Signed)
   05/31/20 0330  Vitals  Temp (!) 103.4 F (39.7 C)  BP (!) 89/59  MAP (mmHg) 69  BP Location Left Arm  BP Method Automatic  Patient Position (if appropriate) Lying  Pulse Rate (!) 110  Resp 19  MEWS COLOR  MEWS Score Color Red  Oxygen Therapy  SpO2 96 %  O2 Device Room Air  Pain Assessment  Pain Scale 0-10  Pain Score 0  MEWS Score  MEWS Temp 2  MEWS Systolic 1  MEWS Pulse 1  MEWS RR 0  MEWS LOC 0  MEWS Score 4  Provider Notification  Provider Name/Title Raulker  Date Provider Notified 05/31/20  Time Provider Notified 0345  Notification Type Call  Notification Reason Change in status  Response No new orders (made Dr. aware of NS bolus from Rapid RN)  Date of Provider Response 05/31/20  Time of Provider Response 0345  Rapid Response Notification  Name of Rapid Response RN Notified Mindy  Date Rapid Response Notified 05/31/20  Time Rapid Response Notified 0340

## 2020-05-31 NOTE — Progress Notes (Signed)
Tylenol given and ice packs applied.

## 2020-05-31 NOTE — Progress Notes (Signed)
Patient alert, reports feeling ok.Jaime [name]

## 2020-05-31 NOTE — Progress Notes (Signed)
Pt has been afebrile and denies any pain. Continues on IV abx. Pt is resting in bed with call light within reach 2 SR up and bed alarm is engaged

## 2020-05-31 NOTE — Progress Notes (Signed)
At 0330, patient with 103.4 temp., BP-89/59 & HR-110. Paged Mindy, 0.9%NS bolus given. PRN tylenol given. Paged Dr. Dalene Carrow, made aware of vitals. No new orders at this time. Jaime [name] A

## 2020-05-31 NOTE — Progress Notes (Signed)
At 1945, Sitting up in chair, reports not feeling good. BUE tremors. Mom at bedside and updated on change in vitals and orders for CXR and UA. Spoke with Dr. Dalene Carrow, orders received. Did not give lopressor IV, Dr. Dalene Carrow aware. Clean catch UA&CS collected at 2350, urine cloudy. Patient reports increased urinary frequency and incontinence.Jaime [name]

## 2020-05-31 NOTE — Plan of Care (Deleted)
Pt started on medications to assist with BM

## 2020-05-31 NOTE — Progress Notes (Signed)
Mom at bedside and updated on situation.Jaime [name]

## 2020-06-01 ENCOUNTER — Inpatient Hospital Stay (HOSPITAL_COMMUNITY): Payer: Medicaid Other | Admitting: Occupational Therapy

## 2020-06-01 ENCOUNTER — Inpatient Hospital Stay (HOSPITAL_COMMUNITY): Payer: Medicaid Other

## 2020-06-01 LAB — CBC WITH DIFFERENTIAL/PLATELET
Abs Immature Granulocytes: 0.11 10*3/uL — ABNORMAL HIGH (ref 0.00–0.07)
Basophils Absolute: 0 10*3/uL (ref 0.0–0.1)
Basophils Relative: 0 %
Eosinophils Absolute: 0 10*3/uL (ref 0.0–0.5)
Eosinophils Relative: 0 %
HCT: 41.9 % (ref 39.0–52.0)
Hemoglobin: 14.5 g/dL (ref 13.0–17.0)
Immature Granulocytes: 1 %
Lymphocytes Relative: 7 %
Lymphs Abs: 1.3 10*3/uL (ref 0.7–4.0)
MCH: 31.4 pg (ref 26.0–34.0)
MCHC: 34.6 g/dL (ref 30.0–36.0)
MCV: 90.7 fL (ref 80.0–100.0)
Monocytes Absolute: 1.9 10*3/uL — ABNORMAL HIGH (ref 0.1–1.0)
Monocytes Relative: 10 %
Neutro Abs: 15.8 10*3/uL — ABNORMAL HIGH (ref 1.7–7.7)
Neutrophils Relative %: 82 %
Platelets: 200 10*3/uL (ref 150–400)
RBC: 4.62 MIL/uL (ref 4.22–5.81)
RDW: 14.6 % (ref 11.5–15.5)
WBC: 19.2 10*3/uL — ABNORMAL HIGH (ref 4.0–10.5)
nRBC: 0 % (ref 0.0–0.2)

## 2020-06-01 MED ORDER — SENNA 8.6 MG PO TABS
2.0000 | ORAL_TABLET | Freq: Two times a day (BID) | ORAL | Status: DC
  Administered 2020-06-01 – 2020-06-04 (×7): 17.2 mg via ORAL
  Filled 2020-06-01 (×8): qty 2

## 2020-06-01 NOTE — Progress Notes (Signed)
Patient slept good. Timed toileted. Continent most of night. Used urinal independently 2 times, called for assistance with urinal, the other times. Reports feeling better. LBM-08/25. Jaime [name] A

## 2020-06-01 NOTE — Progress Notes (Signed)
Physical Therapy Session Note  Patient Details  Name: Jaime [name] MRN: [id number] Date of Birth: 1996-05-28  Today's Date: 06/01/2020 PT Individual Time: 0800-0850 PT Individual Time Calculation (min): 50 min   Short Term Goals: Week 1:     Skilled Therapeutic Interventions/Progress Updates:  Pt resting in bed, eating breakfast.  He denied pain.  PT donned TEDS and socks while pt ate breakfast.  Pt reported that he was about to urinate.  With prompting, he used the urinal independently, including un-fastending brief.  He needed min assist to refasten it snugly. He donned shirt and pants sitting EOB. When questioned, pt reported mild dizziness.,  Sit> stand and stand pivot to wc without AD, supervision.    Gait training iwht RW on level tile, x 90', CGA.  Pt exaggerates hip flexion in order to clear feet, without cues.  Up/down 12 steps R rail, with CGA, as per home situation. When questioned, pt reported no dizziness.  Pt was safe with foot placement on treads, self correcting several times when only front part of foot was initially on tread.  Pt reported that there is no BR on main level of apt, only LR and kitchen.   At end of session, pt in recliner with needs at hand and seat pad alarm set.     Therapy Documentation Precautions:  Precautions Precautions: Fall Precaution Comments: hx of POTS Restrictions Weight Bearing Restrictions: No      Therapy/Group: Individual Therapy  Leverett Camplin 06/01/2020, 9:40 AM

## 2020-06-01 NOTE — Progress Notes (Signed)
[place] PHYSICAL MEDICINE & REHABILITATION PROGRESS NOTE   Subjective/Complaints: Working in gym with therapy. Has no complaints and therapist has no concerns Slept well last night Continent most of the night.  ROS: limited by congition  Objective:   DG Chest 2 View  Result Date: 05/30/2020 CLINICAL DATA:  Fever EXAM: CHEST - 2 VIEW COMPARISON:  05/20/2020 FINDINGS: The heart size and mediastinal contours are within normal limits. Both lungs are clear. The visualized skeletal structures are unremarkable. IMPRESSION: Negative Electronically Signed   By: Charlett Nose M.D.   On: 05/30/2020 20:48   Recent Labs    05/31/20 1633 06/01/20 0508  WBC 24.1* 19.2*  HGB 16.6 14.5  HCT 48.6 41.9  PLT 197 200   Recent Labs    05/30/20 1929  NA 135  K 3.8  CL 100  CO2 23  GLUCOSE 109*  BUN 7  CREATININE 0.88  CALCIUM 9.8    Intake/Output Summary (Last 24 hours) at 06/01/2020 0944 Last data filed at 06/01/2020 0314 Gross per 24 hour  Intake 360 ml  Output 750 ml  Net -390 ml     Physical Exam: Vital Signs Blood pressure 103/65, pulse 63, temperature 98.1 F (36.7 C), temperature source Oral, resp. rate 15, height 5\' 9"  (1.753 m), weight 53.7 kg, SpO2 100 %. General: Alert and oriented x 3, No apparent distress HEENT: Head is normocephalic, atraumatic, PERRLA, EOMI, sclera anicteric, oral mucosa pink and moist, dentition intact, ext ear canals clear,  Neck: Supple without JVD or lymphadenopathy Heart: Reg rate and rhythm. No murmurs rubs or gallops Chest: CTA bilaterally without wheezes, rales, or rhonchi; no distress Abdomen: Soft, non-tender, non-distended, bowel sounds positive. Extremities: No clubbing, cyanosis, or edema. Pulses are 2+ Neuro: tremor of UEs when reaching for things- so worse with intention-still notable- but a little less  alert, cooperative. Follows basic commands. Fair insight. Poor initiation- could be due to being autistic- flat/monotone  speech/affect. Ataxic limbs with limited depth perception and fine motor coordination. Strength 5/5. Sensory exam intact.  Musculoskeletal: Full ROM, No pain with AROM or PROM in the neck, trunk, or extremities. Posture appropriate Psych: Flat at times but laughing when watching TV or video games  Assessment/Plan: 1. Functional deficits secondary to MS exacerbation which require 3+ hours per day of interdisciplinary therapy in a comprehensive inpatient rehab setting.  Physiatrist is providing close team supervision and 24 hour management of active medical problems listed below.  Physiatrist and rehab team continue to assess barriers to discharge/monitor patient progress toward functional and medical goals  Care Tool:  Bathing    Body parts bathed by patient: Right arm, Left arm, Chest, Abdomen, Front perineal area, Buttocks, Face, Right upper leg, Left upper leg, Left lower leg, Right lower leg         Bathing assist Assist Level: Supervision/Verbal cueing     Upper Body Dressing/Undressing Upper body dressing   What is the patient wearing?: Pull over shirt    Upper body assist Assist Level: Set up assist    Lower Body Dressing/Undressing Lower body dressing      What is the patient wearing?: Pants, Underwear/pull up     Lower body assist Assist for lower body dressing: Contact Guard/Touching assist     Toileting Toileting    Toileting assist Assist for toileting: Contact Guard/Touching assist Assistive Device Comment: urinal.   Transfers Chair/bed transfer  Transfers assist     Chair/bed transfer assist level: Supervision/Verbal cueing  Locomotion Ambulation   Ambulation assist      Assist level: Contact Guard/Touching assist Assistive device: Walker-rolling Max distance: 90   Walk 10 feet activity   Assist     Assist level: Contact Guard/Touching assist Assistive device: Walker-rolling   Walk 50 feet activity   Assist Walk 50 feet  with 2 turns activity did not occur: Safety/medical concerns (dizziness)  Assist level: Contact Guard/Touching assist Assistive device: Walker-rolling    Walk 150 feet activity   Assist Walk 150 feet activity did not occur: Safety/medical concerns (dizziness)  Assist level: Minimal Assistance - Patient > 75% Assistive device: Walker-rolling    Walk 10 feet on uneven surface  activity   Assist Walk 10 feet on uneven surfaces activity did not occur: Safety/medical concerns (dizziness)         Wheelchair     Assist Will patient use wheelchair at discharge?: Yes Type of Wheelchair: Manual    Wheelchair assist level: Supervision/Verbal cueing Max wheelchair distance: 150    Wheelchair 50 feet with 2 turns activity    Assist        Assist Level: Supervision/Verbal cueing   Wheelchair 150 feet activity     Assist      Assist Level: Supervision/Verbal cueing   Blood pressure 103/65, pulse 63, temperature 98.1 F (36.7 C), temperature source Oral, resp. rate 15, height 5\' 9"  (1.753 m), weight 53.7 kg, SpO2 100 %.  Medical Problem List and Plan: 1.Gait abnormality with ataxiasecondary to MS exacerbation. IV Solu-Medrol completed  8/25- tremors still notable- using heavy utensils -patient may shower -ELOS/Goals: 5-7 days, mod I  8/23- will need disease modifying agent after d.c   -Continue CIR 2. Antithrombotics: -DVT/anticoagulation:Lovenox- discontinued as ambulating >150 feet -antiplatelet therapy: N/A 3. Pain Management:Tylenol as needed. Well controlled 4. Mood:Provide emotional support -antipsychotic agents: N/A 5. Neuropsych: This patientiscapable of making decisions on hisown behalf.  -add melatonin for sleep 6. Skin/Wound Care:Routine skin checks 7. Fluids/Electrolytes/Nutrition:Routine in and outs with follow-up chemistrieson Monday.  8/23- K+ is 3.4- low- will replete  8/27-  labs Monday- every week while here 8. High functioning autism. Stable at baseline 9.Dysautonomia orthostatic hypotension syndrome. ProAmatine 5 mg 3 times daily -monitor bp's with activity today  8/23- BP soft low 100s/60s- pulse 59  8/24- Pulse 55; BP 100/70- con't Midodrine- if has any dizziness, might need to increase midodrine dose  8/25- increase to 7.5 mg TID due to dizziness per therapy   8/26- Pulse 109 this AM- not normal for him- usually 50s-60s- less dizziness- so will con't midodrine 7.5 mg TID  8/27- BP 97/60s- denies dizziness this AM- will monitor  8/29: BP 103/65- improved 10. GERD: on protonix at home  -resume today 11. Hypokalemia  8/23- replete KCl x1 40 mEq-  8/27- labs qMonday  12. Insomnia  8/23- restarted melatonin which is home medicine.   13. Constipation  8/23- LBM 4+ days ago- will check KUB-   8/24- KUB was OK- had BM BEFORE KUB done.  8/25- wants something else- ordered sorbitol to get him to go again.    8/26- small BM last night- will be careful with adding bowel meds- will add just senokot 1 tab daily for now- hope helps him have BM regularly.   8/27- gave sorbitol today and increased senokot to 2 tabs daily.  14. Febrile, tachycardic to 134, hypotensive, rising WBC, MEWS 5 on 8/27: UA positive for UTI. Vanc and Zosyn started, narrow to Keflex since fever has resolved. Urine  culture pending. CXR negative. Repeat CBC continues to have elevated WBC- repeat 8/29 am.   8/29: Afebrile, WBC trending downward. Repeat tomorrow. UC shows >10,000 colonies GNR, pending sensitivities. Continue Keflex which appears to be helping. Fewer episodes of incontinence last night.   LOS: 8 days A FACE TO FACE EVALUATION WAS PERFORMED  Drema Pry Juli Odom 06/01/2020, 9:44 AM

## 2020-06-01 NOTE — Progress Notes (Signed)
Occupational Therapy Weekly Progress Note  Patient Details  Name: Jaime [name] MRN: [id number] Date of Birth: 1996/08/27  Beginning of progress report period: May 25, 2020 End of progress report period: June 01, 2020  Today's Date: 06/01/2020 OT Individual Time: 1660-6301 OT Individual Time Calculation (min): 57 min    The pt did not have any STGs as he is staying longer than the original ELOS. However, pt is progressing toward his LTGs for OT and is overall supervision/CGA level with most ADLs and transfers.   Patient continues to demonstrate the following deficits: muscle weakness, decreased cardiorespiratoy endurance,   and decreased standing balance and tremors and therefore will continue to benefit from skilled OT intervention to enhance overall performance with BADL and Reduce care partner burden.  Patient progressing toward long term goals..  Continue plan of care.  OT Short Term Goals Week 1:  OT Short Term Goal 1 (Week 1): STG= LTG d/t ELOS OT Short Term Goal 1 - Progress (Week 1): Progressing toward goal Week 2:  OT Short Term Goal 1 (Week 2): STG= LTG d/t ELOS  Skilled Therapeutic Interventions/Progress Updates:    Pt greeted at time of session sitting up in recliner agreeable to OT session, wanting to shower. Ambulated to bathroom CGA/CS with RW, noted to be wet with urine and pt stating that urinal was shaking earlier today. Ambulated to toilet and transferred in same manner. Doffed clothing with supervision and tremors present but still able to complete the task. BP checked, 98/77 and 0/10 dizziness. Ambulated short distance to shower level with hand held assist and transferred to shower seat with CGA, performed UB/LB bathing at shower level with Supervision including brief stands to wash buttocks. However the drain became clogged, requiring the pt to walk back to toilet level with hand held assist to finish bathing with washcloths and dried off sitting on toilet, NT  notified and is going to place order to have shower drain fixed. Ambulated back to recliner and after therapist assist to don binder, UB dress s/u, LB dress with supervision. BP checked 115/78. Set up with alarm on, call bell in reach.   Therapy Documentation Precautions:  Precautions Precautions: Fall Precaution Comments: hx of POTS Restrictions Weight Bearing Restrictions: No    Therapy/Group: Individual Therapy  Erasmo Score 06/01/2020, 12:09 PM

## 2020-06-02 ENCOUNTER — Inpatient Hospital Stay (HOSPITAL_COMMUNITY): Payer: Medicaid Other | Admitting: Occupational Therapy

## 2020-06-02 ENCOUNTER — Inpatient Hospital Stay (HOSPITAL_COMMUNITY): Payer: Medicaid Other

## 2020-06-02 ENCOUNTER — Encounter: Payer: Medicaid Other | Admitting: Physical Medicine and Rehabilitation

## 2020-06-02 LAB — CBC WITH DIFFERENTIAL/PLATELET
Abs Immature Granulocytes: 0.06 10*3/uL (ref 0.00–0.07)
Basophils Absolute: 0 10*3/uL (ref 0.0–0.1)
Basophils Relative: 0 %
Eosinophils Absolute: 0.1 10*3/uL (ref 0.0–0.5)
Eosinophils Relative: 1 %
HCT: 41.9 % (ref 39.0–52.0)
Hemoglobin: 14.4 g/dL (ref 13.0–17.0)
Immature Granulocytes: 1 %
Lymphocytes Relative: 16 %
Lymphs Abs: 1.7 10*3/uL (ref 0.7–4.0)
MCH: 31.6 pg (ref 26.0–34.0)
MCHC: 34.4 g/dL (ref 30.0–36.0)
MCV: 91.9 fL (ref 80.0–100.0)
Monocytes Absolute: 1.2 10*3/uL — ABNORMAL HIGH (ref 0.1–1.0)
Monocytes Relative: 11 %
Neutro Abs: 7.8 10*3/uL — ABNORMAL HIGH (ref 1.7–7.7)
Neutrophils Relative %: 71 %
Platelets: 210 10*3/uL (ref 150–400)
RBC: 4.56 MIL/uL (ref 4.22–5.81)
RDW: 14.6 % (ref 11.5–15.5)
WBC: 10.9 10*3/uL — ABNORMAL HIGH (ref 4.0–10.5)
nRBC: 0 % (ref 0.0–0.2)

## 2020-06-02 LAB — BASIC METABOLIC PANEL
Anion gap: 9 (ref 5–15)
BUN: 10 mg/dL (ref 6–20)
CO2: 25 mmol/L (ref 22–32)
Calcium: 9.5 mg/dL (ref 8.9–10.3)
Chloride: 106 mmol/L (ref 98–111)
Creatinine, Ser: 0.99 mg/dL (ref 0.61–1.24)
GFR calc Af Amer: >60 mL/min (ref >60)
GFR calc non Af Amer: >60 mL/min (ref >60)
Glucose, Bld: 97 mg/dL (ref 70–99)
Potassium: 4.3 mmol/L (ref 3.5–5.1)
Sodium: 140 mmol/L (ref 135–145)

## 2020-06-02 LAB — URINE CULTURE: Culture: 10000 — AB

## 2020-06-02 MED ORDER — SORBITOL 70 % SOLN
30.0000 mL | Freq: Once | Status: DC
  Filled 2020-06-02: qty 30

## 2020-06-02 NOTE — Progress Notes (Signed)
  Physical Therapy Session Note  Patient Details  Name: Jaime [name] MRN: [id number] Date of Birth: 1996/02/05  Today's Date: 06/02/2020 PT Individual Time: 1020-1118 PT Individual Time Calculation (min): 58 min   Short Term Goals: Week 1:     Skilled Therapeutic Interventions/Progress Updates:     Pt received seated in recliner and agreeable to therapy. Reports no pain. Stand pivot transfer to [facility] with CGA. WC transport to therapy gym for time management. Pt performs NMR for standing balance, standing at high low table. Pt performs BLE heel raises  1x20 with BUE support 1x20 with LUE support 1x20 with RUE support 1x20 with finger tip support to facilitate increased WB and balance challenge for BLEs.  Pt performs standing activity without UE support, using fine motor skills to place clothespins on target. PT cues for posture to improve body mechanics.  Pt completes gait training in litegait over treadmill. PT assists pt to don harness in standing and provides minA to step up onto treadmill. Pt completes following on treadmill with light bodyweight support: 6:00 starting at 0.8 mph, increasing gradually to 1.5 mph, then decreasing to 1.3 mph due to deteriorating giat pattern. Pt completes 714'.  WC transport back to room. Stand pivot transfer to recliner with CGA. Left seated with alarm intact and all needs within reach.  Therapy Documentation Precautions:  Precautions Precautions: Fall Precaution Comments: hx of POTS Restrictions Weight Bearing Restrictions: No   Therapy/Group: Individual Therapy  Beau Fanny, PT, DPT 06/02/2020, 3:45 PM

## 2020-06-02 NOTE — Progress Notes (Signed)
Occupational Therapy Session Note  Patient Details  Name: Jaime [name] MRN: [id number] Date of Birth: 1996-04-21  Today's Date: 06/02/2020 OT Individual Time: 1345-1445 OT Individual Time Calculation (min): 60 min   Short Term Goals: Week 2:  OT Short Term Goal 1 (Week 2): STG= LTG d/t ELOS  Skilled Therapeutic Interventions/Progress Updates:    Pt greeted in the recliner with no c/o pain. ADLs needs met. Discussed his hobbies at home with pt verbalizing that he likes to draw. Focus placed at UE coordination while engaging in this meaningful task while seated and also while standing with CGA using device. With arm braced against bedside table for closed chain, pt able to use a non-modified writing implement to draw. OT strapped a weight to wrist with no noted difference in ataxic/tremulous movement. Longest standing window without rest 12 minutes. Near end of session pt requested to use the restroom. Ambulatory transfer to toilet completed using RW with CGA. Pt left in care of NT for assist with hygiene once he was finished voiding.     Therapy Documentation Precautions:  Precautions Precautions: Fall Precaution Comments: hx of POTS Restrictions Weight Bearing Restrictions: No Vital Signs: Therapy Vitals Temp: 97.9 F (36.6 C) Temp Source: Oral Pulse Rate: 82 Resp: 17 BP: 100/66 Patient Position (if appropriate): Sitting Oxygen Therapy SpO2: 100 % O2 Device: Room Air ADL: ADL Eating: Set up, Minimal cueing Where Assessed-Eating: Chair Grooming: Setup Where Assessed-Grooming: Chair Upper Body Bathing: Supervision/safety Where Assessed-Upper Body Bathing: Shower Lower Body Bathing: Supervision/safety Where Assessed-Lower Body Bathing: Shower Upper Body Dressing: Setup Where Assessed-Upper Body Dressing: Chair Lower Body Dressing: Supervision/safety Where Assessed-Lower Body Dressing: Chair Toileting: Supervision/safety Where Assessed-Toileting: Contractor: Therapist, music Method: Counselling psychologist: Energy manager: Contact guard, Minimal cueing Social research officer, government Method: Heritage manager: Shower seat with back, Grab bars      Therapy/Group: Individual Therapy  Maycel Riffe A Josafat Enrico 06/02/2020, 3:33 PM

## 2020-06-02 NOTE — Progress Notes (Signed)
Occupational Therapy Session Note  Patient Details  Name: Jaime [name] MRN: [id number] Date of Birth: 1996-07-12  Today's Date: 06/02/2020 OT Individual Time: 6226-3335 OT Individual Time Calculation (min): 72 min    Short Term Goals: Week 2:  OT Short Term Goal 1 (Week 2): STG= LTG d/t ELOS   Skilled Therapeutic Interventions/Progress Updates:    Pt greeted at time of session supine in bed, needing to use bathroom. Supine to sit Supervision and therapist donned abdominal binder prior to ambulating with RW to bathroom close supervision, transferred to toilet in the same manner. Extended time on toilet for BM, indep with hygiene. Hand held assist walking to shower in small space and transferred to shower seat CGA. Supervision for bathing at shower level all aspects including static stands to wash buttocks. Cues throughout shower for moving onto next body part occasionally. Dried off in the same manner and donned underwear/brief with close supervision for standing to don over hips. Ambulated back to room and collected clothing in standing with Min/CGA for standing balance at dresser for reaching for items/drawers. Donned shirt set up, pants with supervision as well as socks/shoes. Transported to ADL bathroom via wheelchair and after demonstration from therapist, pt performed step over tub/shower transfer with CGA. Pt open to idea of TTB, will discuss with mom as pt already has shower seat. Brought back to room via wheelchair and transferred to recliner Supervision with RW, alarm on, call bell inreach.    Therapy Documentation Precautions:  Precautions Precautions: Fall Precaution Comments: hx of POTS Restrictions Weight Bearing Restrictions: No     Therapy/Group: Individual Therapy  Erasmo Score 06/02/2020, 12:29 PM

## 2020-06-02 NOTE — Progress Notes (Signed)
[place] PHYSICAL MEDICINE & REHABILITATION PROGRESS NOTE   Subjective/Complaints: Working in gym with therapy. Has no complaints and therapist has no concerns Slept well last night Continent most of the night.  ROS:  Limited by cognition  Objective:   No results found. Recent Labs    06/01/20 0508 06/02/20 0516  WBC 19.2* 10.9*  HGB 14.5 14.4  HCT 41.9 41.9  PLT 200 210   Recent Labs    05/30/20 1929 06/02/20 0516  NA 135 140  K 3.8 4.3  CL 100 106  CO2 23 25  GLUCOSE 109* 97  BUN 7 10  CREATININE 0.88 0.99  CALCIUM 9.8 9.5    Intake/Output Summary (Last 24 hours) at 06/02/2020 0850 Last data filed at 06/02/2020 0801 Gross per 24 hour  Intake 804 ml  Output 1200 ml  Net -396 ml     Physical Exam: Vital Signs Blood pressure 106/65, pulse 66, temperature 98.9 F (37.2 C), resp. rate 15, height 5\' 9"  (1.753 m), weight 53.7 kg, SpO2 99 %. General: awake, smiling at videos, appropriate, ate breakfast, NAD HEENT: conjugate gaze,  Neck: Supple without JVD or lymphadenopathy Heart: RRR Chest: CTA B/L- no W/R/R- good air movement Abdomen: Soft, NT, ND, (+)BS  Extremities: No clubbing, cyanosis, or edema. Pulses are 2+ Neuro: tremor of UEs when reaching for things- so worse with intention-worse this AM, but doesn't appear to notice himself  alert, cooperative. Follows basic commands. Fair insight. Poor initiation- could be due to being autistic- flat/monotone speech/affect. Ataxic limbs with limited depth perception and fine motor coordination. Strength 5/5. Sensory exam intact.  Musculoskeletal: Full ROM, No pain with AROM or PROM in the neck, trunk, or extremities. Posture appropriate Psych: flat in interaction  Assessment/Plan: 1. Functional deficits secondary to MS exacerbation which require 3+ hours per day of interdisciplinary therapy in a comprehensive inpatient rehab setting.  Physiatrist is providing close team supervision and 24 hour management of  active medical problems listed below.  Physiatrist and rehab team continue to assess barriers to discharge/monitor patient progress toward functional and medical goals  Care Tool:  Bathing    Body parts bathed by patient: Right arm, Left arm, Chest, Abdomen, Front perineal area, Buttocks, Face, Right upper leg, Left upper leg, Left lower leg, Right lower leg         Bathing assist Assist Level: Supervision/Verbal cueing     Upper Body Dressing/Undressing Upper body dressing   What is the patient wearing?: Pull over shirt    Upper body assist Assist Level: Set up assist    Lower Body Dressing/Undressing Lower body dressing      What is the patient wearing?: Pants, Underwear/pull up     Lower body assist Assist for lower body dressing: Contact Guard/Touching assist     Toileting Toileting    Toileting assist Assist for toileting: Supervision/Verbal cueing Assistive Device Comment: urinal.   Transfers Chair/bed transfer  Transfers assist     Chair/bed transfer assist level: Supervision/Verbal cueing     Locomotion Ambulation   Ambulation assist      Assist level: Contact Guard/Touching assist Assistive device: Walker-rolling Max distance: 90   Walk 10 feet activity   Assist     Assist level: Contact Guard/Touching assist Assistive device: Walker-rolling   Walk 50 feet activity   Assist Walk 50 feet with 2 turns activity did not occur: Safety/medical concerns (dizziness)  Assist level: Contact Guard/Touching assist Assistive device: Walker-rolling    Walk 150 feet activity  Assist Walk 150 feet activity did not occur: Safety/medical concerns (dizziness)  Assist level: Minimal Assistance - Patient > 75% Assistive device: Walker-rolling    Walk 10 feet on uneven surface  activity   Assist Walk 10 feet on uneven surfaces activity did not occur: Safety/medical concerns (dizziness)         Wheelchair     Assist Will patient  use wheelchair at discharge?: Yes Type of Wheelchair: Manual    Wheelchair assist level: Supervision/Verbal cueing Max wheelchair distance: 150    Wheelchair 50 feet with 2 turns activity    Assist        Assist Level: Supervision/Verbal cueing   Wheelchair 150 feet activity     Assist      Assist Level: Supervision/Verbal cueing   Blood pressure 106/65, pulse 66, temperature 98.9 F (37.2 C), resp. rate 15, height 5\' 9"  (1.753 m), weight 53.7 kg, SpO2 99 %.  Medical Problem List and Plan: 1.Gait abnormality with ataxiasecondary to MS exacerbation. IV Solu-Medrol completed  8/25- tremors still notable- using heavy utensils -patient may shower -ELOS/Goals: 5-7 days, mod I  8/23- will need disease modifying agent after d.c   -Continue CIR 2. Antithrombotics: -DVT/anticoagulation:Lovenox- discontinued as ambulating >150 feet -antiplatelet therapy: N/A 3. Pain Management:Tylenol as needed. Well controlled 4. Mood:Provide emotional support -antipsychotic agents: N/A 5. Neuropsych: This patientiscapable of making decisions on hisown behalf.  -add melatonin for sleep 6. Skin/Wound Care:Routine skin checks 7. Fluids/Electrolytes/Nutrition:Routine in and outs with follow-up chemistrieson Monday.  8/23- K+ is 3.4- low- will replete  8/27- labs Monday- every week while here  8/30- K+ 4.3 8. High functioning autism. Stable at baseline 9.Dysautonomia orthostatic hypotension syndrome. ProAmatine 5 mg 3 times daily -monitor bp's with activity today  8/23- BP soft low 100s/60s- pulse 59  8/24- Pulse 55; BP 100/70- con't Midodrine- if has any dizziness, might need to increase midodrine dose  8/25- increase to 7.5 mg TID due to dizziness per therapy   8/26- Pulse 109 this AM- not normal for him- usually 50s-60s- less dizziness- so will con't midodrine 7.5 mg TID  8/27- BP 97/60s- denies dizziness  this AM- will monitor  8/29: BP 103/65- improved  8/30- BP controlled- soft, but doing better than before-  10. GERD: on protonix at home  -resume today 11. Hypokalemia  8/23- replete KCl x1 40 mEq-  8/27- labs qMonday   8/30- K+ 4.3 12. Insomnia  8/23- restarted melatonin which is home medicine.   13. Constipation  8/23- LBM 4+ days ago- will check KUB-   8/24- KUB was OK- had BM BEFORE KUB done.  8/25- wants something else- ordered sorbitol to get him to go again.    8/26- small BM last night- will be careful with adding bowel meds- will add just senokot 1 tab daily for now- hope helps him have BM regularly.   8/27- gave sorbitol today and increased senokot to 2 tabs daily.   8/30- No BM since 8/27- will give Sorbitol- had Senokot increased last night 14. Febrile, tachycardic to 134, hypotensive, rising WBC, MEWS 5 on 8/27: UA positive for UTI. Vanc and Zosyn started, narrow to Keflex since fever has resolved. Urine culture pending. CXR negative. Repeat CBC continues to have elevated WBC- repeat 8/29 am.   8/29: Afebrile, WBC trending downward. Repeat tomorrow. UC shows >10,000 colonies GNR, pending sensitivities. Continue Keflex which appears to be helping. Fewer episodes of incontinence last night.   8/30- WBC down to 10.9 from 19k- appears  that Keflex is working- con't regimen   LOS: 9 days A FACE TO FACE EVALUATION WAS PERFORMED  Quinisha Mould 06/02/2020, 8:50 AM

## 2020-06-02 NOTE — Progress Notes (Signed)
Slept good. Continent using urinal. No unsafe behaviors. LBM 08/28, called Dr. Dalene Carrow at [facility], increased Senna to bid. Jaime [name] A

## 2020-06-03 ENCOUNTER — Inpatient Hospital Stay (HOSPITAL_COMMUNITY): Payer: Medicaid Other

## 2020-06-03 ENCOUNTER — Inpatient Hospital Stay (HOSPITAL_COMMUNITY): Payer: Medicaid Other | Admitting: Occupational Therapy

## 2020-06-03 NOTE — Patient Care Conference (Signed)
Inpatient RehabilitationTeam Conference and Plan of Care Update Date: 06/03/2020   Time: 11:45 AM    Patient Name: Jaime [name]      Medical Record Number: [id number]  Date of Birth: Aug 20, 1996 Sex: Male         Room/Bed: 4W10C/4W10C-01 Payor Info: Payor: MEDICAID [place] / Plan: MEDICAID [place] ACCESS / Product Type: *No Product type* /    Admit Date/Time:  05/24/2020 12:50 PM  Primary Diagnosis:  Multiple sclerosis exacerbation [facility])  Hospital Problems: Principal Problem:   Multiple sclerosis exacerbation [facility])    Expected Discharge Date: Expected Discharge Date: 06/05/20  Team Members Present: Physician leading conference: Dr. Genice Rouge Care Coodinator Present: Kennyth Arnold, RN, BSN, CRRN;Cecile Sheerer, LCSWA Nurse Present: Mosetta Pigeon, RN PT Present: Malachi Pro, PT OT Present: Earleen Newport, OT PPS Coordinator present : Edson Snowball, Park Breed, SLP     Current Status/Progress Goal Weekly Team Focus  Bowel/Bladder   continent of bowel & bladder, LBM 06/02/20  normal elimination pattern  continue to monitor & assist as needed   Swallow/Nutrition/ Hydration             ADL's   CS/CGA for ADL transfers, shower level bathing supervision including stands, LB dress supervision, occasional CGA for ADL transfers  supervision with all self care and transfers, S dynamic standing balance  dynamic standing, safety awareness, ADL training, potential family ed   Mobility   CGA gait 200' with RW, supervision bed mobility and transfers  Supervision  Balance, Coordination, Stairs, DC prep   Communication             Safety/Cognition/ Behavioral Observations            Pain   no c/o pain, has tylenol prn  remain pain free  assess & treat as needed   Skin   no skin issues  no new skin issues  assess q shift     Discharge Planning:  Pt to d/c to home with 24/7 care from mother. Mother cares for younger children (30 and 41 y.o), one of which is autistic as well.  Mother is here most days after 2pm when school is finished for the day as they are still remote education due to COVID.   Team Discussion: No complaints of pain, urine looks better, nursing will dc IV. Patient is at Supervision level with OT. Patient ambulated 200' with RW at Contact guard level. Mom comes in after 2 PM. No need for family education. Patient on target to meet rehab goals: Yes, patient is at goal level.  *See Care Plan and progress notes for long and short-term goals.   Revisions to Treatment Plan:  None  Teaching Needs: None  Current Barriers to Discharge: Home enviroment access/layout, Lack of/limited family support, Weight, Weight bearing restrictions and tremors.  Possible Resolutions to Barriers: Offer nutritional supplements, patient will be seen on Outpatient basis by Neuro for tremors.     Medical Summary Current Status: LBM yesterday- need to d/c IV- UTI- on Keflex- increased midodrine for low BP/dizziness; no wounds  Barriers to Discharge: Home enviroment access/layout;Decreased family/caregiver support;Weight;Weight bearing restrictions  Barriers to Discharge Comments: on PO ABX; for UTI- low BP- on midodrine- d/c This Thursday Possible Resolutions to [employer] Focus: Superivison level ADLs; tremors- still an issue- d/c Thursday   Continued Need for Acute Rehabilitation Level of Care: The patient requires daily medical management by a physician with specialized training in physical medicine and rehabilitation for the following reasons: Direction of  a multidisciplinary physical rehabilitation program to maximize functional independence : Yes Medical management of patient stability for increased activity during participation in an intensive rehabilitation regime.: Yes Analysis of laboratory values and/or radiology reports with any subsequent need for medication adjustment and/or medical intervention. : Yes   I attest that I was present, lead the team  conference, and concur with the assessment and plan of the team.   Tennis Must 06/03/2020, 3:40 PM

## 2020-06-03 NOTE — Progress Notes (Signed)
Occupational Therapy Discharge Summary  Patient Details  Name: GARRET TEALE MRN: [id number] Date of Birth: 1996/06/03    Patient has met 7 of 7 long term goals due to improved activity tolerance, improved balance, postural control, ability to compensate for deficits and improved coordination.  Patient to discharge at overall Supervision level. Patient's care partner is independent to provide the necessary physical and cognitive assistance at discharge.    Reasons goals not met: n/a  Recommendation:  Patient will benefit from ongoing skilled OT services in outpatient setting to continue to advance functional skills in the area of BADL and Reduce care partner burden.  Equipment: No equipment provided Pt already has RW and shower seat. Mother is also aware of TTB option if wants to purchase independently.  Reasons for discharge: treatment goals met and discharge from hospital  Patient/family agrees with progress made and goals achieved: Yes  OT Discharge Precautions/Restrictions  Precautions Precautions: Fall Precaution Comments: hx of POTS, MS/tremors Restrictions Weight Bearing Restrictions: No Pain Pain Assessment Pain Scale: 0-10 Pain Score: 0-No pain ADL ADL Eating: Set up, Minimal cueing Where Assessed-Eating: Chair Grooming: Setup Where Assessed-Grooming: Chair Upper Body Bathing: Supervision/safety Where Assessed-Upper Body Bathing: Shower Lower Body Bathing: Supervision/safety Where Assessed-Lower Body Bathing: Shower Upper Body Dressing: Setup Where Assessed-Upper Body Dressing: Chair Lower Body Dressing: Supervision/safety Where Assessed-Lower Body Dressing: Chair Toileting: Supervision/safety Where Assessed-Toileting: Glass blower/designer: Therapist, music Method: Counselling psychologist: Energy manager: Contact guard, Minimal cueing Social research officer, government Method: Heritage manager:  Civil engineer, contracting with back, Grab bars Vision Baseline Vision/History: Wears glasses Wears Glasses: At all times Perception  Perception: Within Functional Limits Praxis Praxis: Intact Cognition Overall Cognitive Status: History of cognitive impairments - at baseline Arousal/Alertness: Awake/alert Orientation Level: Oriented X4 Awareness: Appears intact Problem Solving:  (with close supervision) Safety/Judgment:  (with close supervisoin) Sensation Sensation Light Touch: Appears Intact Proprioception: Appears Intact Coordination Gross Motor Movements are Fluid and Coordinated: No Fine Motor Movements are Fluid and Coordinated: No Coordination and Movement Description: ataxic and gross tremors but improved since eval Motor  Motor Motor: Ataxia Motor - Skilled Clinical Observations: x 4 extremities, but improved since eval Mobility  Bed Mobility Rolling Right: Independent Rolling Left: Independent Supine to Sit: Independent Sit to Supine: Independent Transfers Sit to Stand: Supervision/Verbal cueing Stand to Sit: Supervision/Verbal cueing  Trunk/Postural Assessment  Cervical Assessment Cervical Assessment: Within Functional Limits Thoracic Assessment Thoracic Assessment: Exceptions to [facility] Lumbar Assessment Lumbar Assessment: Exceptions to [facility] Postural Control Righting Reactions: slightly delayed  Balance Balance Balance Assessed: Yes Static Sitting Balance Static Sitting - Balance Support: Feet supported Static Sitting - Level of Assistance: 7: Independent Dynamic Sitting Balance Dynamic Sitting - Balance Support: Feet supported Dynamic Sitting - Level of Assistance: 7: Independent Static Standing Balance Static Standing - Balance Support: During functional activity;Bilateral upper extremity supported Static Standing - Level of Assistance: 5: Stand by assistance Dynamic Standing Balance Dynamic Standing - Balance Support: Bilateral upper extremity supported;During  functional activity Dynamic Standing - Level of Assistance: 5: Stand by assistance Extremity/Trunk Assessment RUE Assessment RUE Assessment: Exceptions to [facility] General Strength Comments: Generalized weakness, ataxic with tremors at rest and with intention but improved since eval LUE Assessment LUE Assessment: Exceptions to [facility] General Strength Comments: Generalized weakness, ataxic with tremors at rest and with intention but improved since eval   Viona Gilmore 06/03/2020, 12:12 PM

## 2020-06-03 NOTE — Progress Notes (Signed)
Occupational Therapy Session Note  Patient Details  Name: Jaime [name] MRN: [id number] Date of Birth: 02-12-1996  Today's Date: 06/03/2020 OT Individual Time: 0086-7619 and 1400-1455 OT Individual Time Calculation (min): 69 min and 55 min   Short Term Goals: Week 2:  OT Short Term Goal 1 (Week 2): STG= LTG d/t ELOS  Skilled Therapeutic Interventions/Progress Updates:    Pt greeted at time of session sitting up in recliner agreeable to OT session. Called mother at beginning of session since the pt is going home later this week and to discuss ADLs/skill performance in prep for home, approx 20 minutes on phone prior to ADL regarding DC planning. Ambulated chair to bathroom CS with RW, transferred to toilet in same manner, performed toileting 3/3 tasks with CGA/CS. Doffed clothing sitting on toilet and ambulated hand held assist to shower and transferred CGA/CS. Performed UB/LB bathing with close supervision, extended time to perform all aspects as the pt has his routine. Dried off in the same manner and ambulated to toilet with hand held assist where he dried off remaining amount sitting and donned underwear with supervision. Ambulated to recliner with CGA/CS with RW and transferred in same manner, donned shirt with set up and pants with Supervision in standing, Donned socks and shoes with supervision as well. Alarm on, call bell in reach.  Session 2: Pt greeted at time of session sitting up in recliner agreeable to OT session, SPT from recliner to wheelchair with RW with CGA/CS. Transported to gym via wheelchair for time management and performed stairs training with using R handrail only to simulate home environment for 2 reps x 4 stairs with no rest break and pt performed with CGA. Pt has a tendency to externally rotate hip for foot placement, cues to correct form. Brought to gym via wheelchair and set up at SCIFIT seated for 5 mins forward/backward with a very short rest break on level 5 for  reciprocal BUE movement and strengthening. Brought back to room via wheelchair and ambulated back to recliner with close supervision with RW, set up with alarm on, call bell in reach. Provided with reference sheet for TTB if mom is interested from previous phone conversation this am.   Therapy Documentation Precautions:  Precautions Precautions: Fall Precaution Comments: hx of POTS Restrictions Weight Bearing Restrictions: No     Therapy/Group: Individual Therapy  Erasmo Score 06/03/2020, 11:29 AM

## 2020-06-03 NOTE — Progress Notes (Signed)
Patient ID: Jaime [name], male   DOB: [birth year], 24 y.o.   MRN: [id number]  SW followed up with mother [name] [phone]) per mother request to discuss his discharge. SW spoke with pt mother to inform d/c date remains on 9/2 and outpatient therapies. She had questions related to medications. SW informed will ask medical team to follow-up to dicsuss further. She also reported concerns related to her ex-husband picking up patient, and states that she is his guardian and would like all staff to be aware. SW informed charge nurse.    Cecile Sheerer, MSW, LCSWA Office: [phone] Cell: [phone] Fax: [phone]

## 2020-06-03 NOTE — Plan of Care (Signed)
  Problem: RH BOWEL ELIMINATION Goal: RH STG MANAGE BOWEL WITH ASSISTANCE Description: STG Manage Bowel with MOD I Assistance. Outcome: Progressing   Problem: RH BLADDER ELIMINATION Goal: RH STG MANAGE BLADDER WITH ASSISTANCE Description: STG Manage Bladder With MOD I Assistance Outcome: Progressing   Problem: RH SKIN INTEGRITY Goal: RH STG SKIN FREE OF INFECTION/BREAKDOWN Description: Skin will be free of infection/breakdown with min assist Outcome: Progressing Goal: RH STG MAINTAIN SKIN INTEGRITY WITH ASSISTANCE Description: STG Maintain Skin Integrity With min Assistance. Outcome: Progressing   Problem: RH SAFETY Goal: RH STG ADHERE TO SAFETY PRECAUTIONS W/ASSISTANCE/DEVICE Description: STG Adhere to Safety Precautions With supv Assistance/Device. Outcome: Progressing   Problem: RH PAIN MANAGEMENT Goal: RH STG PAIN MANAGED AT OR BELOW PT'S PAIN GOAL Description: Pain will be managed at or below a 3 out of 10 MOD I assist Outcome: Completed/Met   Problem: RH KNOWLEDGE DEFICIT GENERAL Goal: RH STG INCREASE KNOWLEDGE OF SELF CARE AFTER HOSPITALIZATION Description: Pt will be able to verbalize ways to remain physically independent with ADLs with supv assist Outcome: Progressing

## 2020-06-03 NOTE — Progress Notes (Signed)
Physical Therapy Weekly Progress Note  Patient Details  Name: Jaime [name] MRN: [id number] Date of Birth: [birth year]  Beginning of progress report period: May 25, 2020 End of progress report period: June 03, 2020  Today's Date: 06/03/2020 PT Individual Time: 0830-0900 PT Individual Time Calculation (min): 30 min   Patient did not have STGs due to original expected LOS. Pt is progressing well toward long term mobility goals, however, with improved strength, bed mobility, balance, transfers, and ambulation. Pt is performing bed mobility with supervision, transfers and ambulation with CGA and RW. Pt demos ongoing weakness in core and BLEs, with low endurance. Pt will benefit from ongoing PT with focus on strengthening and DC prep in coming week.  Patient continues to demonstrate the following deficits muscle weakness, decreased cardiorespiratoy endurance, motor apraxia and decreased coordination and decreased standing balance, decreased postural control and decreased balance strategies and therefore will continue to benefit from skilled PT intervention to increase functional independence with mobility.  Patient progressing toward long term goals..  Continue plan of care.  PT Short Term Goals Week 1:  PT Short Term Goal 1 (Week 1): STGs not created due to pt's original expected LOS. Week 2:  PT Short Term Goal 1 (Week 2): STGs=LTGs due to ELOS  Skilled Therapeutic Interventions/Progress Updates:  Ambulation/gait training;Discharge planning;DME/adaptive equipment instruction;Functional mobility training;Pain management;Psychosocial support;Splinting/orthotics;Therapeutic Activities;UE/LE Strength taining/ROM;Visual/perceptual remediation/compensation;Balance/vestibular training;Community reintegration;Neuromuscular re-education;Patient/family education;Stair training;Therapeutic Exercise;UE/LE Coordination activities;Wheelchair propulsion/positioning   Pt misses first 30 minutes of  therapy session due to finishing breakfast. Pt then received supine in bed and agreeable to therapy. PT dons pt's ted hose in supine. Supine to sit with supervision for cues on hand placement and positioning. Sit to stand transfers with close supervision and stand step transfer to toilet with RW and CGA. Pt then ambulates 200' with RW and CGA with verbal cues on increasing proximity to RW and decreasing WB through RW for energy conservation. Pt left seated in recliner with alarm intact and all needs within reach.  Therapy Documentation Precautions:  Precautions Precautions: Fall Precaution Comments: hx of POTS, MS/tremors Restrictions Weight Bearing Restrictions: No   Therapy/Group: Individual Therapy  Beau Fanny, PT, DPT 06/03/2020, 3:29 PM

## 2020-06-03 NOTE — Discharge Summary (Signed)
Physician Discharge Summary  Patient ID: JEBIDIAH BAGGERLY MRN: [id number] DOB/AGE: Jul 04, 1996 24 y.o.  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 Both lungs are clear. The visualized skeletal structures are unremarkable. IMPRESSION: Negative Electronically Signed   By: Charlett Nose M.D.   On: 05/30/2020 20:48   DG Abd 1 View  Result Date: 05/26/2020 CLINICAL DATA:  Constipation EXAM: ABDOMEN - 1 VIEW COMPARISON:  None. FINDINGS: The bowel gas pattern is normal. Colonic stool burden is within normal limits. No radio-opaque calculi or other significant radiographic abnormality are seen. IMPRESSION: Negative. Electronically Signed   By: Duanne Guess D.O.   On: 05/26/2020 09:34   MR Brain W and Wo Contrast  Result Date: 05/19/2020 CLINICAL DATA:  Multiple sclerosis follow-up. Increased extremity weakness and tremors. EXAM: MRI HEAD WITHOUT AND WITH CONTRAST TECHNIQUE: Multiplanar, multiecho pulse sequences of the brain and surrounding structures were obtained without and with intravenous contrast. CONTRAST:  52mL GADAVIST GADOBUTROL 1 MMOL/ML IV SOLN COMPARISON:  12/16/2019 FINDINGS: Brain: Numerous bilateral white matter lesions in a pattern consistent with multiple sclerosis. Many of the lesions have decreased in size. Most of the lesions show corresponding low T1-weighted signal. There are multiple contrast-enhancing lesions, including 4 cerebellar lesions, 2 of which are clustered in the left middle cerebellar peduncle. The largest supratentorial  lesion is located in the right frontal white matter. In total, there are approximately 20 contrast-enhancing lesions. Vascular: Normal flow voids. Skull and upper cervical spine: There are multiple lesions noted at the cervicomedullary junction. Sinuses/Orbits: Negative. Other: None. IMPRESSION: 1. Severe white matter disease in a pattern consistent with multiple sclerosis. 2. Numerous (approximately 20) active demyelinating lesions throughout the supra- and infratentorial brain. Electronically Signed   By: Deatra Robinson M.D.   On: 05/19/2020 21:23   DG Chest Port 1 View  Result Date: 05/20/2020 CLINICAL DATA:  Cough. EXAM: PORTABLE CHEST 1 VIEW COMPARISON:  12/12/2019. FINDINGS: Mediastinum and hilar structures normal. Lungs are clear. Tiny bilateral pleural effusions cannot be excluded. Heart size normal. No acute bony abnormality identified. IMPRESSION: Tiny bilateral pleural effusions cannot be excluded. No focal infiltrate identified. Electronically Signed   By: Maisie Fus  Register   On: 05/20/2020 13:44    Labs:  Basic Metabolic Panel: Recent Labs  Lab 05/30/20 1929 06/02/20 0516  NA 135 140  K 3.8 4.3  CL 100 106  CO2 23 25  GLUCOSE 109* 97  BUN 7 10  CREATININE 0.88 0.99  CALCIUM 9.8 9.5    CBC: Recent Labs  Lab 05/31/20 1633 06/01/20 0508 06/02/20 0516  WBC 24.1* 19.2* 10.9*  NEUTROABS 20.3* 15.8* 7.8*  HGB 16.6 14.5 14.4  HCT 48.6 41.9 41.9  MCV 92.4 90.7 91.9  PLT 197 200 210    CBG: No results for input(s): GLUCAP in the last 168 hours.  Family history.  Mother with clotting disorder.  Denies any colon cancer esophageal cancer or rectal cancer  Brief HPI:   Jaime [name] is a 24 y.o. right-handed male with history of documented mild autism, hypertension and recent diagnosis of multiple sclerosis diagnosed March 2021 followed by Dr. Epimenio Foot not on any  immune modulating medications at this time.  Well-known to rehab services from multiple sclerosis exacerbation  12/17/2019 to 12/26/2019.  Per chart review lives in a two-level home bed and bath upstairs need some assist for tub transfers.  Lives with his mother and 65 and 79 year old siblings.  Presented 05/19/2020 with increasing gait abnormality and needing more help with ADLs dragging his right leg and some urinary incontinence.  MRI of the brain showed severe white matter disease in a pattern consistent with multiple sclerosis numerous active demyelinating lesions throughout the supra and infratentorial brain.  Admission chemistries unremarkable SARS coronavirus negative.  Neurology consulted IV Solu-Medrol x5 doses initiated 05/20/2020.  Subcutaneous Lovenox for DVT prophylaxis.  Tolerating a regular diet.  Patient was admitted for a comprehensive rehab program   Hospital Course: DHANUSH JOKERST was admitted to rehab 05/24/2020 for inpatient therapies to consist of PT, ST and OT at least three hours five days a week. Past admission physiatrist, therapy team and rehab RN have worked together to provide customized collaborative inpatient rehab.  Pertaining to patient's MS exacerbation completed 5 doses IV Solu-Medrol follow-up outpatient neurology services.  Subcutaneous Lovenox for DVT prophylaxis discontinued with increased mobility.  Patient noted high functioning autism was attending full therapies.  Bouts of orthostatic hypotension ProAmatine as advised.  GERD Protonix as prior to admission.  Bouts of insomnia restarted on melatonin with good results.  Constipation KUB unremarkable bowel program established no nausea vomiting.  Urinalysis 05/30/2020 - nitrite urine culture 10,000 Klebsiella pneumonia completed course of Keflex.   Blood pressures were monitored on TID basis and soft and monitored     Rehab course: During patient's stay in rehab weekly team conferences were held to monitor patient's progress, set goals and discuss barriers to discharge. At admission, patient required min mod assist 65 feet  rolling walker minimal assist sit to stand supervision supine to sit.  Minimal assist upper body bathing mod assist lower body bathing minimal assist upper body dressing mod assist lower body dressing minimal assist toilet transfers  Physical exam.  Blood pressure 114/71 pulse 50 temperature 97.9 respirations 18 oxygen saturation 9 9% room air Constitutional.  No acute distress HEENT Head.  Normocephalic and atraumatic Eyes.  Pupils round and reactive to light no discharge.nystagmus Neck.  Supple nontender no JVD without thyromegaly Cardiac regular rate rhythm without any extra sounds or murmur heard Abdomen.  Soft nontender positive bowel sounds without rebound Respiratory effort normal no respiratory distress without wheeze Skin.  Warm and dry Neurological alert oriented follows commands cooperative with exam.  Strength 5/5 in all 4 sensation intact to light touch all 4 limbs ataxic  He/  has had improvement in activity tolerance, balance, postural control as well as ability to compensate for deficits. He/ has had improvement in functional use RUE/LUE  and RLE/LLE as well as improvement in awareness.  Sit to stand and stand pivot transfers without assistive device ambulates rolling walker 90 feet contact-guard assist.  Stressed the need for ongoing safety.  Ambulates to the bathroom contact-guard assist doffed clothing with supervision.  Full family teaching completed plan discharge to home       Disposition: Discharged to home    Diet: Regular  Special Instructions: No driving smoking or alcohol  Medications at discharge 1.  Tylenol as needed 2.  Melatonin 6 mg nightly 3.  ProAmatine 7.5 mg p.o. 3 times daily 4.  Protonix 40 mg p.o. daily 5.  Senokot 2 tablets twice daily hold for loose stools  6.  Keflex 500 mg every 12 hours x3 days and stop  30-35 minutes were spent completing discharge summary and discharge planning Discharge Instructions    AMB referral to rehabilitation    Complete by: As directed    Ambulatory referral to Neurology   Complete by: As directed    An appointment is requested in approximately 1 week MS exacerbation   Ambulatory referral to Physical Medicine Rehab   Complete by: As directed    Moderate complexity follow up 1-2 weeks MS exacerbation       Follow-up Information    Lovorn, Aundra Millet, MD Follow up.   Specialty: Physical Medicine and Rehabilitation Why: Office to call for appointment Contact information: [address] [place] [address] [phone]        Asa Lente, MD Follow up.   Specialty: Neurology Why: Call for appointment Contact information: [address] [place] [address] [phone]               Signed: Charlton Amor 06/05/2020, 5:12 AM

## 2020-06-03 NOTE — Progress Notes (Signed)
[facility]YS[facility]ICINE [facility] REHABILITATION PROGRESS NOTE   Subjective/Complaints:  Pt reports had large BM with therapy yesterday- feeling good this AM- tremor doesn't bother him.     ROS: limited by cognition  Objective:   No results found. Recent Labs    06/01/20 0508 06/02/20 0516  WBC 19.2* 10.9*  HGB 14.5 14.4  HCT 41.9 41.9  PLT 200 210   Recent Labs    06/02/20 0516  NA 140  K 4.3  CL 106  CO2 25  GLUCOSE 97  BUN 10  CREATININE 0.99  CALCIUM 9.5    Intake/Output Summary (Last 24 hours) at 06/03/2020 0940 Last data filed at 06/03/2020 0840 Gross per 24 hour  Intake 120 ml  Output 450 ml  Net -330 ml     Physical Exam: Vital Signs Blood pressure 109/70, pulse 100, temperature 98.3 F (36.8 C), resp. rate 17, height 5\' 9"  (1.753 m), weight 53.7 kg, SpO2 97 %. General: awake, starting breakfast, still focused on phone/videos, appropriate, NAD HEENT: conjugate gaze,  Neck: Supple without JVD or lymphadenopathy Heart: RRR Chest: CTA B/L- no W/R/R- good air movement Abdomen: Soft, NT, ND, (+)BS   Extremities: No clubbing, cyanosis, or edema. Pulses are 2+ Neuro: tremor of UEs when reaching for things- so worse with intention-worse this AM, but doesn't appear to notice himself  alert, cooperative. Follows basic commands. Fair insight. Poor initiation- could be due to being autistic- flat/monotone speech/affect. Ataxic limbs with limited depth perception and fine motor coordination. Strength 5/5. Sensory exam intact.  Musculoskeletal: Full ROM, No pain with AROM or PROM in the neck, trunk, or extremities. Posture appropriate Psych: flat chronic interaction  Assessment/Plan: 1. Functional deficits secondary to MS exacerbation which require 3+ hours per day of interdisciplinary therapy in a comprehensive inpatient rehab setting.  Physiatrist is providing close team supervision and 24 hour management of active medical problems listed below.  Physiatrist  and rehab team continue to assess barriers to discharge/monitor patient progress toward functional and medical goals  Care Tool:  Bathing    Body parts bathed by patient: Right arm, Left arm, Chest, Abdomen, Front perineal area, Buttocks, Face, Right upper leg, Left upper leg, Left lower leg, Right lower leg         Bathing assist Assist Level: Supervision/Verbal cueing     Upper Body Dressing/Undressing Upper body dressing   What is the patient wearing?: Pull over shirt    Upper body assist Assist Level: Set up assist    Lower Body Dressing/Undressing Lower body dressing      What is the patient wearing?: Pants, Underwear/pull up     Lower body assist Assist for lower body dressing: Contact Guard/Touching assist     Toileting Toileting    Toileting assist Assist for toileting: Supervision/Verbal cueing Assistive Device Comment: urinal.   Transfers Chair/bed transfer  Transfers assist     Chair/bed transfer assist level: Contact Guard/Touching assist     Locomotion Ambulation   Ambulation assist      Assist level: Total Assistance - Patient < 25% (Litegait) Assistive device: Lite Gait Max distance: 714'   Walk 10 feet activity   Assist     Assist level: Total Assistance - Patient < 25% Assistive device: Lite Gait   Walk 50 feet activity   Assist Walk 50 feet with 2 turns activity did not occur: Safety/medical concerns (dizziness)  Assist level: Contact Guard/Touching assist Assistive device: Walker-rolling    Walk 150 feet activity  Assist Walk 150 feet activity did not occur: Safety/medical concerns (dizziness)  Assist level: Total Assistance - Patient < 25% Assistive device: Lite Gait    Walk 10 feet on uneven surface  activity   Assist Walk 10 feet on uneven surfaces activity did not occur: Safety/medical concerns (dizziness)         Wheelchair     Assist Will patient use wheelchair at discharge?: Yes Type of  Wheelchair: Manual    Wheelchair assist level: Supervision/Verbal cueing Max wheelchair distance: 150    Wheelchair 50 feet with 2 turns activity    Assist        Assist Level: Supervision/Verbal cueing   Wheelchair 150 feet activity     Assist      Assist Level: Supervision/Verbal cueing   Blood pressure 109/70, pulse 100, temperature 98.3 F (36.8 C), resp. rate 17, height 5\' 9"  (1.753 m), weight 53.7 kg, SpO2 97 %.  Medical Problem List and Plan: 1.Gait abnormality with ataxiasecondary to MS exacerbation. IV Solu-Medrol completed  8/25- tremors still notable- using heavy utensils -patient may shower -ELOS/Goals: 5-7 days, mod I  8/23- will need disease modifying agent after d.c   -Continue CIR 2. Antithrombotics: -DVT/anticoagulation:Lovenox- discontinued as ambulating >150 feet -antiplatelet therapy: N/A 3. Pain Management:Tylenol as needed. Well controlled 4. Mood:Provide emotional support -antipsychotic agents: N/A 5. Neuropsych: This patientis?capable of making decisions on hisown behalf.  -add melatonin for sleep 6. Skin/Wound Care:Routine skin checks 7. Fluids/Electrolytes/Nutrition:Routine in and outs with follow-up chemistrieson Monday.  8/23- K+ is 3.4- low- will replete  8/27- labs Monday- every week while here  8/30- K+ 4.3 8. High functioning autism. Stable at baseline 9.Dysautonomia orthostatic hypotension syndrome. ProAmatine 5 mg 3 times daily -monitor bp's with activity today  8/23- BP soft low 100s/60s- pulse 59  8/24- Pulse 55; BP 100/70- con't Midodrine- if has any dizziness, might need to increase midodrine dose  8/25- increase to 7.5 mg TID due to dizziness per therapy   8/26- Pulse 109 this AM- not normal for him- usually 50s-60s- less dizziness- so will con't midodrine 7.5 mg TID  8/27- BP 97/60s- denies dizziness this AM- will monitor  8/29: BP 103/65-  improved  8/30- less dizziness- con't Midodrine 10. GERD: on protonix at home  -resume today 11. Hypokalemia  8/23- replete KCl x1 40 mEq-  8/27- labs qMonday   8/30- K+ 4.3 12. Insomnia  8/23- restarted melatonin which is home medicine.   13. Constipation  8/23- LBM 4+ days ago- will check KUB-   8/24- KUB was OK- had BM BEFORE KUB done.  8/25- wants something else- ordered sorbitol to get him to go again.    8/26- small BM last night- will be careful with adding bowel meds- will add just senokot 1 tab daily for now- hope helps him have BM regularly.   8/27- gave sorbitol today and increased senokot to 2 tabs daily.   8/30- No BM since 8/27- will give Sorbitol- had Senokot increased last night  8/31- had large BM 14. Febrile, tachycardic to 134, hypotensive, rising WBC, MEWS 5 on 8/27: UA positive for UTI. Vanc and Zosyn started, narrow to Keflex since fever has resolved. Urine culture pending. CXR negative. Repeat CBC continues to have elevated WBC- repeat 8/29 am.   8/29: Afebrile, WBC trending downward. Repeat tomorrow. UC shows >10,000 colonies GNR, pending sensitivities. Continue Keflex which appears to be helping. Fewer episodes of incontinence last night.   8/30- WBC down to 10.9 from 19k-  appears that Keflex is working- con't regimen   LOS: 10 days A FACE TO FACE EVALUATION WAS PERFORMED  Abigail Teall 06/03/2020, 9:03 AM

## 2020-06-04 ENCOUNTER — Inpatient Hospital Stay (HOSPITAL_COMMUNITY): Payer: Medicaid Other

## 2020-06-04 LAB — CULTURE, BLOOD (ROUTINE X 2)
Culture: NO GROWTH
Culture: NO GROWTH

## 2020-06-04 MED ORDER — MELATONIN 3 MG PO TABS: 6.0000 mg | ORAL_TABLET | Freq: Every day | ORAL | 0 refills | Status: DC

## 2020-06-04 MED ORDER — SENNA 8.6 MG PO TABS: 2.0000 | ORAL_TABLET | Freq: Two times a day (BID) | ORAL | 0 refills | Status: DC

## 2020-06-04 MED ORDER — PANTOPRAZOLE SODIUM 40 MG PO TBEC: 40.0000 mg | DELAYED_RELEASE_TABLET | Freq: Every day | ORAL | 0 refills | Status: DC

## 2020-06-04 MED ORDER — CEPHALEXIN 500 MG PO CAPS: 500.0000 mg | ORAL_CAPSULE | Freq: Two times a day (BID) | ORAL | 0 refills | Status: DC

## 2020-06-04 MED ORDER — ACETAMINOPHEN 325 MG PO TABS: 650.0000 mg | ORAL_TABLET | ORAL | Status: DC | PRN

## 2020-06-04 MED ORDER — MIDODRINE HCL 2.5 MG PO TABS: 7.5000 mg | ORAL_TABLET | Freq: Three times a day (TID) | ORAL | 0 refills | Status: DC

## 2020-06-04 NOTE — Progress Notes (Signed)
Patient ID: Jaime [name], male   DOB: [birth year], 24 y.o.   MRN: [id number]  SW faxed outpatient PT/OT referral to [facility] (p:[phone]/f:[phone]).  Cecile Sheerer, MSW, LCSWA Office: [phone] Cell: [phone] Fax: [phone]

## 2020-06-04 NOTE — Discharge Instructions (Signed)
Inpatient Rehab Discharge Instructions  KELL FERRIS Discharge date and time: No discharge date for patient encounter.   Activities/Precautions/ Functional Status: Activity: activity as tolerated Diet: regular diet Wound Care: none needed Functional status:  ___ No restrictions     ___ Walk up steps independently ___ 24/7 supervision/assistance   ___ Walk up steps with assistance ___ Intermittent supervision/assistance  ___ Bathe/dress independently ___ Walk with walker     _x__ Bathe/dress with assistance ___ Walk Independently    ___ Shower independently ___ Walk with assistance    ___ Shower with assistance ___ No alcohol     ___ Return to work/school ________   COMMUNITY REFERRALS UPON DISCHARGE:     Outpatient: PT    OT             Agency: [facility]     Phone: [phone]              Appointment Date/Time:*Please expect follow-up within 7-10 business days to schedule appointment. If you have not received follow-up, be sure to contact the site directly.*  Medical Equipment/Items Ordered: Patient has DME  Special Instructions:  No driving smoking or alcohol  My questions have been answered and I understand these instructions. I will adhere to these goals and the provided educational materials after my discharge from the hospital.  Patient/Caregiver Signature _______________________________ Date __________  Clinician Signature _______________________________________ Date __________  Please bring this form and your medication list with you to all your follow-up doctor's appointments.

## 2020-06-04 NOTE — Progress Notes (Signed)
[place] PHYSICAL MEDICINE & REHABILITATION PROGRESS NOTE   Subjective/Complaints:   Pt reports slept real well. No issues.  Called mother who's asking about his medical issues.   VEL:FYBOFBP by cognition  Objective:   No results found. Recent Labs    06/02/20 0516  WBC 10.9*  HGB 14.4  HCT 41.9  PLT 210   Recent Labs    06/02/20 0516  NA 140  K 4.3  CL 106  CO2 25  GLUCOSE 97  BUN 10  CREATININE 0.99  CALCIUM 9.5    Intake/Output Summary (Last 24 hours) at 06/04/2020 0931 Last data filed at 06/04/2020 0508 Gross per 24 hour  Intake 480 ml  Output 675 ml  Net -195 ml     Physical Exam: Vital Signs Blood pressure 101/65, pulse 82, temperature 98.2 F (36.8 C), resp. rate 16, height 5\' 9"  (1.753 m), weight 53.7 kg, SpO2 97 %. General: awake, appropriate, NAD HEENT: conjugate gaze,  Neck: Supple without JVD or lymphadenopathy Heart: RRR Chest: CTA B/L- no W/R/R- good air movement Abdomen: Soft, NT, ND, (+)BS    Extremities: No clubbing, cyanosis, or edema. Pulses are 2+ Neuro: tremor of UEs  Still noticeable  alert, cooperative. Follows basic commands. Fair insight. Poor initiation- could be due to being autistic- flat/monotone speech/affect. Ataxic limbs with limited depth perception and fine motor coordination. Strength 5/5. Sensory exam intact.  Musculoskeletal: Full ROM, No pain with AROM or PROM in the neck, trunk, or extremities. Posture appropriate Psych: flat interaction  Assessment/Plan: 1. Functional deficits secondary to MS exacerbation which require 3+ hours per day of interdisciplinary therapy in a comprehensive inpatient rehab setting.  Physiatrist is providing close team supervision and 24 hour management of active medical problems listed below.  Physiatrist and rehab team continue to assess barriers to discharge/monitor patient progress toward functional and medical goals  Care Tool:  Bathing    Body parts bathed by patient: Right  arm, Left arm, Chest, Abdomen, Front perineal area, Buttocks, Face, Right upper leg, Left upper leg, Left lower leg, Right lower leg         Bathing assist Assist Level: Supervision/Verbal cueing     Upper Body Dressing/Undressing Upper body dressing   What is the patient wearing?: Pull over shirt    Upper body assist Assist Level: Set up assist    Lower Body Dressing/Undressing Lower body dressing      What is the patient wearing?: Pants, Underwear/pull up     Lower body assist Assist for lower body dressing: Supervision/Verbal cueing     Toileting Toileting    Toileting assist Assist for toileting: Supervision/Verbal cueing Assistive Device Comment: urinal.   Transfers Chair/bed transfer  Transfers assist     Chair/bed transfer assist level: Contact Guard/Touching assist     Locomotion Ambulation   Ambulation assist      Assist level: Contact Guard/Touching assist Assistive device: Walker-rolling Max distance: 200'   Walk 10 feet activity   Assist     Assist level: Contact Guard/Touching assist Assistive device: Walker-rolling   Walk 50 feet activity   Assist Walk 50 feet with 2 turns activity did not occur: Safety/medical concerns (dizziness)  Assist level: Contact Guard/Touching assist Assistive device: Walker-rolling    Walk 150 feet activity   Assist Walk 150 feet activity did not occur: Safety/medical concerns (dizziness)  Assist level: Contact Guard/Touching assist Assistive device: Walker-rolling    Walk 10 feet on uneven surface  activity   Assist Walk 10 feet  on uneven surfaces activity did not occur: Safety/medical concerns (dizziness)         Wheelchair     Assist Will patient use wheelchair at discharge?: Yes Type of Wheelchair: Manual    Wheelchair assist level: Supervision/Verbal cueing Max wheelchair distance: 150    Wheelchair 50 feet with 2 turns activity    Assist        Assist Level:  Supervision/Verbal cueing   Wheelchair 150 feet activity     Assist      Assist Level: Supervision/Verbal cueing   Blood pressure 101/65, pulse 82, temperature 98.2 F (36.8 C), resp. rate 16, height 5\' 9"  (1.753 m), weight 53.7 kg, SpO2 97 %.  Medical Problem List and Plan: 1.Gait abnormality with ataxiasecondary to MS exacerbation. IV Solu-Medrol completed  8/25- tremors still notable- using heavy utensils  9/1- New PCP- Dr 11/1- [facility]  -D/C TOMORROW -patient may shower -ELOS/Goals: 5-7 days, mod I  8/23- will need disease modifying agent after d.c   -Continue CIR 2. Antithrombotics: -DVT/anticoagulation:Lovenox- discontinued as ambulating >150 feet -antiplatelet therapy: N/A 3. Pain Management:Tylenol as needed. Well controlled 4. Mood:Provide emotional support -antipsychotic agents: N/A 5. Neuropsych: This patientis?capable of making decisions on hisown behalf.  -add melatonin for sleep 6. Skin/Wound Care:Routine skin checks 7. Fluids/Electrolytes/Nutrition:Routine in and outs with follow-up chemistrieson Monday.  8/23- K+ is 3.4- low- will replete  8/27- labs Monday- every week while here  8/30- K+ 4.3 8. High functioning autism. Stable at baseline 9.Dysautonomia orthostatic hypotension syndrome. ProAmatine 5 mg 3 times daily -monitor bp's with activity today  8/23- BP soft low 100s/60s- pulse 59  8/24- Pulse 55; BP 100/70- con't Midodrine- if has any dizziness, might need to increase midodrine dose  8/25- increase to 7.5 mg TID due to dizziness per therapy   8/26- Pulse 109 this AM- not normal for him- usually 50s-60s- less dizziness- so will con't midodrine 7.5 mg TID  8/27- BP 97/60s- denies dizziness this AM- will monitor  8/29: BP 103/65- improved  8/30- less dizziness- con't Midodrine 10. GERD: on protonix at home  -resume today 11. Hypokalemia  8/23- replete KCl  x1 40 mEq-  8/27- labs qMonday   8/30- K+ 4.3 12. Insomnia  8/23- restarted melatonin which is home   medicine  9/1- doing well.   13. Constipation  8/23- LBM 4+ days ago- will check KUB-   8/24- KUB was OK- had BM BEFORE KUB done.  8/25- wants something else- ordered sorbitol to get him to go again.    8/26- small BM last night- will be careful with adding bowel meds- will add just senokot 1 tab daily for now- hope helps him have BM regularly.   8/27- gave sorbitol today and increased senokot to 2 tabs daily.   8/30- No BM since 8/27- will give Sorbitol- had Senokot increased last night  8/31- had large BM  9/1- takes Senokot or Dulcoax at home 14. Febrile, tachycardic to 134, hypotensive, rising WBC, MEWS 5 on 8/27: UA positive for UTI. Vanc and Zosyn started, narrow to Keflex since fever has resolved. Urine culture pending. CXR negative. Repeat CBC continues to have elevated WBC- repeat 8/29 am.   8/29: Afebrile, WBC trending downward. Repeat tomorrow. UC shows >10,000 colonies GNR, pending sensitivities. Continue Keflex which appears to be helping. Fewer episodes of incontinence last night.   8/30- WBC down to 10.9 from 19k- appears that Keflex is working- con't regimen 15. Tremors  9/1- ask Dr 11/1 about meds,  since the ones I use lower BP.     Talked to  mother- 35 minutes total- answered questions and she was comfortable at end of conversation.  Can try dulcolax or Senokot or miralax for bowels Do regularly Milk of Magnesia- if need be.  Tremors- per Dr Epimenio Foot- can't do Propranolol Also discussed that Dr Epimenio Foot will start meds for MS at f/u appointment, hopefully.     LOS: 11 days A FACE TO FACE EVALUATION WAS PERFORMED  Jaime [name] 06/04/2020, 9:31 AM

## 2020-06-04 NOTE — Progress Notes (Signed)
Occupational Therapy Session Note  Patient Details  Name: Jaime [name] MRN: [id number] Date of Birth: 1996-05-21  Today's Date: 06/04/2020 OT Individual Time: 1015-1059 OT Individual Time Calculation (min): 44 min    Short Term Goals: Week 1:  OT Short Term Goal 1 (Week 1): STG= LTG d/t ELOS OT Short Term Goal 1 - Progress (Week 1): Progressing toward goal  Skilled Therapeutic Interventions/Progress Updates:    1:1. Pt receive in recliner agreeable to OT. Pt completes functional mobility throughout txspaces with supervision with RW and w/c follow. Pt requires VC for occasional L lean or forward lean/proximity to RW.  9HPT LUE: 1 min 13 sec: one grip slip on peg; methodically placing pegs when taking out in a row increasing time RUE: 1 min 2 sec: increased tremors compared to LUE  Pt completes seated and standing building of jenga game and playing jenga game with CGA standing on compliant surface completing mini squats to select piece to remove from stack with up to MIN A for balance. Exited session with pt seated in recliner, exit alarm on and call light tin reach  Therapy Documentation Precautions:  Precautions Precautions: Fall Precaution Comments: hx of POTS, MS/tremors Restrictions Weight Bearing Restrictions: No General:   Vital Signs:   Pain: Pain Assessment Pain Scale: 0-10 Pain Score: 0-No pain ADL: ADL Eating: Set up, Minimal cueing Where Assessed-Eating: Chair Grooming: Setup Where Assessed-Grooming: Chair Upper Body Bathing: Supervision/safety Where Assessed-Upper Body Bathing: Shower Lower Body Bathing: Supervision/safety Where Assessed-Lower Body Bathing: Shower Upper Body Dressing: Setup Where Assessed-Upper Body Dressing: Chair Lower Body Dressing: Supervision/safety Where Assessed-Lower Body Dressing: Chair Toileting: Supervision/safety Where Assessed-Toileting: Teacher, adult education: Furniture conservator/restorer Method: Public house manager: Acupuncturist: Contact guard, Minimal cueing Film/video editor Method: Designer, industrial/product: Information systems manager with back, Grab bars Vision Baseline Vision/History: Wears Scientist, research (medical): Within Functional Limits Praxis Praxis: Intact Exercises:   Other Treatments:     Therapy/Group: Individual Therapy  Shon Hale 06/04/2020, 10:28 AM

## 2020-06-04 NOTE — Progress Notes (Signed)
Occupational Therapy Session Note  Patient Details  Name: Jaime [name] MRN: [id number] Date of Birth: March 10, 1996  Today's Date: 06/04/2020 OT Individual Time: 1500-1605 OT Individual Time Calculation (min): 65 min    Short Term Goals: Week 1:  OT Short Term Goal 1 (Week 1): STG= LTG d/t ELOS OT Short Term Goal 1 - Progress (Week 1): Progressing toward goal  Skilled Therapeutic Interventions/Progress Updates:    1:1. Pt received in recliner agreeable to shower, toileting and dressing. Pt gathers clothing with S with VC for RW placemetn in proximity to dresser and VC for using dresser to steady self to reach down to low drawer to pick out pants. Pt reporting need to toilet. Pt completes 3/3 components of toileting with S and significantly increased time. Pt transfers from toilet to shower with S and bathes with S voerall using grab bars to steady self for standing balance during peri hygiene. Pt dresses in bathroom and also on EOB with supervision at sit to stand level and VC for ted donning technique. Exited session with pt seated in recliner, exit alarm on and call light in reach  Therapy Documentation Precautions:  Precautions Precautions: Fall Precaution Comments: hx of POTS, MS/tremors Restrictions Weight Bearing Restrictions: No General:   Vital Signs:   Pain:   ADL: ADL Eating: Set up, Minimal cueing Where Assessed-Eating: Chair Grooming: Setup Where Assessed-Grooming: Chair Upper Body Bathing: Supervision/safety Where Assessed-Upper Body Bathing: Shower Lower Body Bathing: Supervision/safety Where Assessed-Lower Body Bathing: Shower Upper Body Dressing: Setup Where Assessed-Upper Body Dressing: Chair Lower Body Dressing: Supervision/safety Where Assessed-Lower Body Dressing: Chair Toileting: Supervision/safety Where Assessed-Toileting: Teacher, adult education: Furniture conservator/restorer Method: Proofreader: Multimedia programmer: Contact guard, Minimal cueing Film/video editor Method: Designer, industrial/product: Information systems manager with back, Conservation officer, historic buildings Praxis: Intact Exercises:   Other Treatments:     Therapy/Group: Individual Therapy  Shon Hale 06/04/2020, 3:02 PM

## 2020-06-04 NOTE — Progress Notes (Signed)
Physical Therapy Discharge Summary  Patient Details  Name: Jaime [name] MRN: [id number] Date of Birth: 1996/01/19  Today's Date: 06/04/2020 PT Individual Time: [phone] and [id number] PT Individual Time Calculation (min): 57 min and 27 min   Patient has met 8 of 12 long term goals due to improved activity tolerance, improved balance, improved postural control, increased strength and improved coordination.  Patient to discharge at an ambulatory level Supervision.   Patient's care partner is independent to provide the necessary physical assistance at discharge.  Reasons goals not met: Pt does not need to access 2nd floor of home so stair navigation is adequate for DC. Pt also will have family present to assist as needed so all mobility is adequate for safe DC.  Recommendation:  Patient will benefit from ongoing skilled PT services in outpatient setting to continue to advance safe functional mobility, address ongoing impairments in strength, coordination, transfers, ambulation, endurance, and minimize fall risk.  Equipment: No equipment provided  Reasons for discharge: treatment goals met and discharge from hospital  Patient/family agrees with progress made and goals achieved: Yes   Skilled therapeutic Interventions:  1st Session: Pt received supine in bed and agreeable to therapy. Bed mobility independent. Pt dons ted hose, socks, and shoes at EOB without physical assistance. Pt ambulates 200' with RW and supervision with cues for increasing proximity to RW. Pt completes car transfer and ramp navigation with cues on positioning and sequencing. Pt completes 12 steps with BHRs and close supervision for safety and cues for sequencing and hand placement. Pt completes x3 reps of TUG, as detailed below. Pt self propels WC x150' with cues on management and propulsion technique, especially during turns. Pt left seated in recliner with alarm intact and all needs within reach.  2nd Session: pt  performs sit to stand with close supervision for safety. Pt ambulates x2 bouts of 150' with RW and close supervision. Pt performs x10:00 on Nustep at workload of 5 for total of 512 steps. Performed for endurance training and reciprocal coordination training. PT provides pt with HEP focusing on core strengthening and answers pt questions about performance. Pt left seated in recliner with alarm intact and all needs within reach.  PT Discharge Precautions/Restrictions Restrictions Weight Bearing Restrictions: No Pain Pain Assessment Pain Scale: 0-10 Pain Score: 0-No pain Vision/Perception  Perception Perception: Within Functional Limits Praxis Praxis: Intact  Cognition Overall Cognitive Status: History of cognitive impairments - at baseline Arousal/Alertness: Awake/alert Orientation Level: Oriented X4 Safety/Judgment: Appears intact Sensation Sensation Light Touch: Appears Intact Coordination Gross Motor Movements are Fluid and Coordinated: No Fine Motor Movements are Fluid and Coordinated: No Coordination and Movement Description: ataxic and gross tremors but improved since eval Heel Shin Test: reduced speed, accuracy and excursion bil, but much improved from eval Motor  Motor Motor: Ataxia Motor - Skilled Clinical Observations: x 4 extremities, but improved since eval  Mobility Bed Mobility Bed Mobility: Rolling Right;Rolling Left;Supine to Sit;Sit to Supine Rolling Right: Independent Rolling Left: Independent Supine to Sit: Independent Sit to Supine: Independent Transfers Transfers: Sit to Stand;Stand to Sit;Stand Pivot Transfers Sit to Stand: Supervision/Verbal cueing Stand to Sit: Supervision/Verbal cueing Stand Pivot Transfers: Supervision/Verbal cueing Stand Pivot Transfer Details: Verbal cues for technique Transfer (Assistive device): Rolling walker Locomotion  Gait Ambulation: Yes Gait Assistance: Supervision/Verbal cueing Gait Distance (Feet): 200  Feet Assistive device: Rolling walker Gait Assistance Details: Verbal cues for technique;Verbal cues for safe use of DME/AE Gait Gait: Yes Gait Pattern: Impaired Gait Pattern: Step-through pattern;Decreased trunk  rotation;Trunk flexed Gait velocity: Decreased Stairs / Additional Locomotion Stairs: Yes Stairs Assistance: Supervision/Verbal cueing Stair Management Technique: Two rails Number of Stairs: 12 Height of Stairs: 6 Ramp: Supervision/Verbal cueing Curb: Supervision/Verbal Location manager Mobility: Yes Wheelchair Assistance: Chartered loss adjuster: Both upper extremities Wheelchair Parts Management: Needs assistance Distance: 150'  Trunk/Postural Assessment  Cervical Assessment Cervical Assessment:  (forward head) Thoracic Assessment Thoracic Assessment:  (rounded shoulders) Lumbar Assessment Lumbar Assessment:  (posterior pelvic tilt) Postural Control Postural Control: Deficits on evaluation Righting Reactions: slightly delayed  Balance Balance Balance Assessed: Yes Standardized Balance Assessment Standardized Balance Assessment: Timed Up and Go Test Timed Up and Go Test TUG: Normal TUG (with RW) Normal TUG (seconds): 35.1 Static Sitting Balance Static Sitting - Balance Support: Feet supported Static Sitting - Level of Assistance: 7: Independent Dynamic Sitting Balance Dynamic Sitting - Balance Support: Feet supported Dynamic Sitting - Level of Assistance: 7: Independent Static Standing Balance Static Standing - Level of Assistance: 5: Stand by assistance Dynamic Standing Balance Dynamic Standing - Balance Support: Bilateral upper extremity supported;During functional activity Dynamic Standing - Level of Assistance: 5: Stand by assistance Extremity Assessment  RLE Assessment General Strength Comments: Grossly 4+/5 LLE Assessment General Strength Comments: Grossly 4+/5    Breck Coons, PT,  DPT 06/04/2020, 4:11 PM

## 2020-06-05 NOTE — Progress Notes (Signed)
Pt was awake alert and oriented this morning and able to take all meds by mouth with no problem. Pt has no complains of pain or any other issues. Pt is out of bed awaiting ride (mother) to arrive. This nurse will be leaving for lunch and Carlena Sax, LPN will keep watch for when it is time for him to D/C; with the help of Loraine Leriche, NT  Late entry

## 2020-06-05 NOTE — Progress Notes (Signed)
[facility] PHYSICAL MEDICINE & REHABILITATION PROGRESS NOTE   Subjective/Complaints:  Ready for d/c- no complaints- Spoke with mother about all medical issues yesterday and f/u.    ROS: limited by cognition  Objective:   No results found. No results for input(s): WBC, HGB, HCT, PLT in the last 72 hours. No results for input(s): NA, K, CL, CO2, GLUCOSE, BUN, CREATININE, CALCIUM in the last 72 hours.  Intake/Output Summary (Last 24 hours) at 06/05/2020 0904 Last data filed at 06/05/2020 0654 Gross per 24 hour  Intake 480 ml  Output 250 ml  Net 230 ml     Physical Exam: Vital Signs Blood pressure 105/68, pulse 72, temperature 98.3 F (36.8 C), resp. rate 18, height 5\' 9"  (1.753 m), weight 53.7 kg, SpO2 97 %. General: awake, appropriate, sitting up in bed- finished breakfast, smiling some, NAD HEENT: conjugate gaze,  Neck: Supple without JVD or lymphadenopathy Heart: RRR Chest: CTA B/L- no W/R/R- good air movement Abdomen: Soft, NT, ND, (+)BS   Extremities: No clubbing, cyanosis, or edema. Pulses are 2+ Neuro: tremor of UEs  Still very noticeable  alert, cooperative. Follows basic commands. Fair insight. Poor initiation- could be due to being autistic- flat/monotone speech/affect. Ataxic limbs with limited depth perception and fine motor coordination. Strength 5/5. Sensory exam intact.  Musculoskeletal: Full ROM, No pain with AROM or PROM in the neck, trunk, or extremities. Posture appropriate Psych: flat in interaction  Assessment/Plan: 1. Functional deficits secondary to MS exacerbation which require 3+ hours per day of interdisciplinary therapy in a comprehensive inpatient rehab setting.  Physiatrist is providing close team supervision and 24 hour management of active medical problems listed below.  Physiatrist and rehab team continue to assess barriers to discharge/monitor patient progress toward functional and medical goals  Care Tool:  Bathing    Body parts bathed  by patient: Right arm, Left arm, Chest, Abdomen, Front perineal area, Buttocks, Face, Right upper leg, Left upper leg, Left lower leg, Right lower leg         Bathing assist Assist Level: Set up assist     Upper Body Dressing/Undressing Upper body dressing   What is the patient wearing?: Pull over shirt    Upper body assist Assist Level: Independent    Lower Body Dressing/Undressing Lower body dressing      What is the patient wearing?: Pants     Lower body assist Assist for lower body dressing: Supervision/Verbal cueing     Toileting Toileting    Toileting assist Assist for toileting: Supervision/Verbal cueing Assistive Device Comment: urinal.   Transfers Chair/bed transfer  Transfers assist     Chair/bed transfer assist level: Supervision/Verbal cueing     Locomotion Ambulation   Ambulation assist      Assist level: Supervision/Verbal cueing Assistive device: Walker-rolling Max distance: 200'   Walk 10 feet activity   Assist     Assist level: Supervision/Verbal cueing Assistive device: Walker-rolling   Walk 50 feet activity   Assist Walk 50 feet with 2 turns activity did not occur: Safety/medical concerns (dizziness)  Assist level: Supervision/Verbal cueing Assistive device: Walker-rolling    Walk 150 feet activity   Assist Walk 150 feet activity did not occur: Safety/medical concerns (dizziness)  Assist level: Supervision/Verbal cueing Assistive device: Walker-rolling    Walk 10 feet on uneven surface  activity   Assist Walk 10 feet on uneven surfaces activity did not occur: Safety/medical concerns (dizziness)   Assist level: Supervision/Verbal cueing Assistive device:  Assist Will patient use wheelchair at discharge?: Yes Type of Wheelchair: Manual    Wheelchair assist level: Supervision/Verbal cueing Max wheelchair distance: 150    Wheelchair 50 feet with 2 turns  activity    Assist        Assist Level: Supervision/Verbal cueing   Wheelchair 150 feet activity     Assist      Assist Level: Supervision/Verbal cueing   Blood pressure 105/68, pulse 72, temperature 98.3 F (36.8 C), resp. rate 18, height 5\' 9"  (1.753 m), weight 53.7 kg, SpO2 97 %.  Medical Problem List and Plan: 1.Gait abnormality with ataxiasecondary to MS exacerbation. IV Solu-Medrol completed  8/25- tremors still notable- using heavy utensils  9/1- New PCP- Dr 11/1- [facility]  -D/C TOMORROW -patient may shower -ELOS/Goals: 5-7 days, mod I  8/23- will need disease modifying agent after d.c   -Continue CIR 2. Antithrombotics: -DVT/anticoagulation:Lovenox- discontinued as ambulating >150 feet -antiplatelet therapy: N/A 3. Pain Management:Tylenol as needed. Well controlled 4. Mood:Provide emotional support -antipsychotic agents: N/A 5. Neuropsych: This patientis?capable of making decisions on hisown behalf.  -add melatonin for sleep 6. Skin/Wound Care:Routine skin checks 7. Fluids/Electrolytes/Nutrition:Routine in and outs with follow-up chemistrieson Monday.  8/23- K+ is 3.4- low- will replete  8/27- labs Monday- every week while here  8/30- K+ 4.3 8. High functioning autism. Stable at baseline 9.Dysautonomia orthostatic hypotension syndrome. ProAmatine 5 mg 3 times daily -monitor bp's with activity today  8/23- BP soft low 100s/60s- pulse 59  8/24- Pulse 55; BP 100/70- con't Midodrine- if has any dizziness, might need to increase midodrine dose  8/25- increase to 7.5 mg TID due to dizziness per therapy   8/26- Pulse 109 this AM- not normal for him- usually 50s-60s- less dizziness- so will con't midodrine 7.5 mg TID  8/27- BP 97/60s- denies dizziness this AM- will monitor  8/29: BP 103/65- improved  8/30- less dizziness- con't Midodrine 10. GERD: on protonix at  home  -resume today 11. Hypokalemia  8/23- replete KCl x1 40 mEq-  8/27- labs qMonday   8/30- K+ 4.3 12. Insomnia  8/23- restarted melatonin which is home   medicine  9/1- doing well.   13. Constipation  8/23- LBM 4+ days ago- will check KUB-   8/24- KUB was OK- had BM BEFORE KUB done.  8/25- wants something else- ordered sorbitol to get him to go again.    8/26- small BM last night- will be careful with adding bowel meds- will add just senokot 1 tab daily for now- hope helps him have BM regularly.   8/27- gave sorbitol today and increased senokot to 2 tabs daily.   8/30- No BM since 8/27- will give Sorbitol- had Senokot increased last night  8/31- had large BM  9/2- will need to take Senokot or Dulcolax at home- d/w mother 14. Febrile, tachycardic to 134, hypotensive, rising WBC, MEWS 5 on 8/27: UA positive for UTI. Vanc and Zosyn started, narrow to Keflex since fever has resolved. Urine culture pending. CXR negative. Repeat CBC continues to have elevated WBC- repeat 8/29 am.   8/29: Afebrile, WBC trending downward. Repeat tomorrow. UC shows >10,000 colonies GNR, pending sensitivities. Continue Keflex which appears to be helping. Fewer episodes of incontinence last night.   8/30- WBC down to 10.9 from 19k- appears that Keflex is working- con't regimen  9/2- will go home finishing Keflex 15. Tremors  9/1- ask Dr 11/1 about meds, since the ones I use lower BP.   9/2- will  need to get started on something- cannot use Propranolol due to low BP.     Talked to  mother- yesterday- answered questions and she was comfortable at end of conversation.  Can try dulcolax or Senokot or miralax for bowels Do regularly Milk of Magnesia- if need be.  Tremors- per Dr Epimenio Foot- can't do Propranolol Also discussed that Dr Epimenio Foot will start meds for MS at f/u appointment, hopefully.     LOS: 12 days A FACE TO FACE EVALUATION WAS PERFORMED  Alicia Ackert 06/05/2020, 9:04 AM

## 2020-06-05 NOTE — Progress Notes (Signed)
Inpatient Rehabilitation Care Coordinator  Discharge Note  The overall goal for the admission was met for:   Discharge location: Yes. D/c to home with mother who will provide 24/7 care.   Length of Stay: Yes. 12 days.   Discharge activity level: Yes. Close supervision.   Home/community participation: Yes. Limited.  Services provided included: MD, RD, PT, OT, SLP, RN, CM, TR, Pharmacy, Neuropsych and SW  Financial Services: Medicaid  Follow-up services arranged: Outpatient: [facility] for PT/OT  Comments (or additional information): contact [name] [phone]  Patient/Family verbalized understanding of follow-up arrangements: Yes  Individual responsible for coordination of the follow-up plan: Pt to have assistance with care needs.   Confirmed correct DME delivered: Rana Snare 06/05/2020    Rana Snare

## 2020-06-19 ENCOUNTER — Telehealth: Payer: Self-pay | Admitting: *Deleted

## 2020-06-19 ENCOUNTER — Encounter: Payer: Self-pay | Admitting: Neurology

## 2020-06-19 ENCOUNTER — Ambulatory Visit: Payer: Medicaid Other | Admitting: Neurology

## 2020-06-19 ENCOUNTER — Other Ambulatory Visit: Payer: Self-pay

## 2020-06-19 VITALS — BP 110/60 | HR 93 | Ht 69.0 in | Wt 123.0 lb

## 2020-06-19 DIAGNOSIS — R2689 Other abnormalities of gait and mobility: Secondary | ICD-10-CM

## 2020-06-19 DIAGNOSIS — Z79899 Other long term (current) drug therapy: Secondary | ICD-10-CM

## 2020-06-19 DIAGNOSIS — I951 Orthostatic hypotension: Secondary | ICD-10-CM

## 2020-06-19 DIAGNOSIS — G903 Multi-system degeneration of the autonomic nervous system: Secondary | ICD-10-CM

## 2020-06-19 DIAGNOSIS — F329 Major depressive disorder, single episode, unspecified: Secondary | ICD-10-CM

## 2020-06-19 DIAGNOSIS — G35 Multiple sclerosis: Secondary | ICD-10-CM

## 2020-06-19 DIAGNOSIS — F32A Depression, unspecified: Secondary | ICD-10-CM

## 2020-06-19 DIAGNOSIS — F840 Autistic disorder: Secondary | ICD-10-CM | POA: Diagnosis not present

## 2020-06-19 MED ORDER — SERTRALINE HCL 50 MG PO TABS: 50.0000 mg | ORAL_TABLET | Freq: Every day | ORAL | 11 refills | Status: DC

## 2020-06-19 NOTE — Progress Notes (Signed)
[facility]  PATIENT: Jaime [name] DOB: Oct 16, 1995  REFERRING DOCTOR OR PCP: Sherald Barge, MD SOURCE: Patient, notes from hospital, imaging and lab reports, MRI images personally reviewed.  _________________________________   HISTORICAL  CHIEF COMPLAINT:  Chief Complaint  Patient presents with  . New Patient (Initial Visit)    RM 12 with mother. Internal referral from [facility]. Was at [facility] 05/19/20-05/24/20 and 05/24/20-06/05/20. Received 5 days IV steroids starting 05/20/20. Still having dizziness and tremors (hands and legs). Ambulates with rolling walker. Great, great Uncle had MS (he has since passed). Having trouble with right eye blurriness that is intermittent. wears glasses.     HISTORY OF PRESENT ILLNESS:  I had the pleasure seeing your patient, Rye Decoste, at the [facility] at [facility].  He is a 24 year old man who was diagnosed with MS March 2021 after presenting with poor balance, disorientation.  At the time, he was walking his dog and collapsed prompting a visit to the ED.   MRI's of the head and spine showed multiple lesions c/w MS.   In retrospect, he had some imbalance in February but was walking at his baseline January 2021.   Multiple brain and T-spine lesions enhanced.   He ws admitted and received 5 days of IV solu-medrol.   He improved but not to baseline.  He did outpatient rehab.  He never saw neurology as outpatient.   In August 2021, he began to have more difficulty with his gait and re-presented to the ED 05/19/2020.    It showed numerous enhancing lesions and he was admitted for 5 more days of IV Solu-medrol.    He improved some but is not at baseline.  He is currently reporting a reduced gait and he uses a walker.   He has right leg weakness.  He denies numbness.    He has blurriness out of his right eye but is better than last month.   He has bladder urgency and incontinence but is better than last month.   He has some slurred speech, worse in March.    He has some fatigue.   He sleeps well.  He notes some depression and anxiety.    He feels cognition is doing about the same as before the MS but he has some word finding slowness at times.    His great uncle had MS.     He has not had the Covid-19 vaccination.   Medical issues include POTS, autism spectrum disorder and depression  Imaging review:   MRI of the brain 05/19/2020 showed multiple T2/FLAIR hyperintense foci in the periventricular, juxtacortical and deep white matter.  There are also foci in the cerebellum, left middle cerebellar peduncle and brainstem.  Multiple lesions enhance including some in the hemispheres, cerebellum and middle cerebellar peduncle.  Foci that enhanced on the March 2021 MRI appear smaller and do not enhance  MRI of the brain  12/16/2019 shows Multiple T2/FLAIR hyperintense foci in the periventricular, juxtacortical and deep white matter of the hemispheres.  Additionally, foci are noted in the brainstem and cerebellum enhancement.  MRI of the brain 12/11/2019 shows multiple T2/FLAIR hyperintense foci in the periventricular, juxtacortical and deep white matter of the hemispheres.  Many of the periventricular foci are radially oriented to the ventricles.  Foci are noted in the deep gray matter, brainstem and cerebellar peduncles multiple lesions enhance.  MRI of the cervical spine 12/11/2019 shows extensive T2 hyperintense foci in the spinal cord the most  prominent focus is posteriorly to the right at C2.  None of the foci enhanced.  MRI of the thoracic spine 12/11/2019 showed multiple T2 hyperintense foci within the spinal cord.  There was enhancement at T1-T2 and T10-T11.  MRI of the lumbar spine 12/11/2019 showed some demyelination in the distal spinal cord but no enhancement.  There is a small disc protrusion at L5-S1 that does not cause nerve root compression or spinal stenosis.  Laboratory tests: 06/19/2020: Hep B surface  antibody positive.  Hep B surface antigen and hep C core antibody are negative.   QuantiFERON-TB is negative VZV IgG shows that he is not immune to varicella.  Labs done March 2021 admission: RPR negative, HIV negative, ACE normal.  NMO antibody was negative.  SARS-CoV-2 PCR was negative.  Cerebrospinal fluid December 12, 2019: IgG index 1.1 (this is very elevated consistent with MS, oligoclonal bands was not performed).  CSF was otherwise normal. .       REVIEW OF SYSTEMS: Constitutional: No fevers, chills, sweats, or change in appetite.  He notes fatigue Eyes: No visual changes, double vision, eye pain Ear, nose and throat: No hearing loss, ear pain, nasal congestion, sore throat Cardiovascular: No chest pain, palpitations Respiratory: No shortness of breath at rest or with exertion.   No wheezes GastrointestinaI: No nausea, vomiting, diarrhea, abdominal pain, fecal incontinence Genitourinary: No dysuria, urinary retention or frequency.  No nocturia. Musculoskeletal: No neck pain, back pain Integumentary: No rash, pruritus, skin lesions Neurological: as above Psychiatric: As above Endocrine: No palpitations, diaphoresis, change in appetite, change in weigh or increased thirst Hematologic/Lymphatic: No anemia, purpura, petechiae. Allergic/Immunologic: No itchy/runny eyes, nasal congestion, recent allergic reactions, rashes  ALLERGIES: No Known Allergies  HOME MEDICATIONS:  Current Outpatient Medications:  .  acetaminophen (TYLENOL) 325 MG tablet, Take 2 tablets (650 mg total) by mouth every 4 (four) hours as needed for mild pain or fever (or Fever >/= 101)., Disp: , Rfl:  .  cephALEXin (KEFLEX) 500 MG capsule, Take 1 capsule (500 mg total) by mouth every 12 (twelve) hours., Disp: 6 capsule, Rfl: 0 .  midodrine (PROAMATINE) 2.5 MG tablet, Take 3 tablets (7.5 mg total) by mouth 3 (three) times daily with meals., Disp: 90 tablet, Rfl: 0 .  pantoprazole (PROTONIX) 40 MG tablet,  Take 1 tablet (40 mg total) by mouth daily., Disp: 30 tablet, Rfl: 0 .  senna (SENOKOT) 8.6 MG TABS tablet, Take 2 tablets (17.2 mg total) by mouth 2 (two) times daily at 10 AM and 5 PM., Disp: 120 tablet, Rfl: 0 .  melatonin 3 MG TABS tablet, Take 2 tablets (6 mg total) by mouth at bedtime. (Patient not taking: Reported on 06/19/2020), Disp: 30 tablet, Rfl: 0 .  sertraline (ZOLOFT) 50 MG tablet, Take 1 tablet (50 mg total) by mouth daily., Disp: 30 tablet, Rfl: 11  PAST MEDICAL HISTORY: Past Medical History:  Diagnosis Date  . Autism   . Depression   . MS (multiple sclerosis) (HCC)     PAST SURGICAL HISTORY: Past Surgical History:  Procedure Laterality Date  . NO PAST SURGERIES      FAMILY HISTORY: Family History  Problem Relation Age of Onset  . Clotting disorder Mother     SOCIAL HISTORY:  Social History   Socioeconomic History  . Marital status: Single    Spouse name: Not on file  . Number of children: Not on file  . Years of education: Not on file  . Highest education level: Not on  file  Occupational History  . Not on file  Tobacco Use  . Smoking status: Never Smoker  . Smokeless tobacco: Never Used  Vaping Use  . Vaping Use: Never used  Substance and Sexual Activity  . Alcohol use: No  . Drug use: Never  . Sexual activity: Not Currently  Other Topics Concern  . Not on file  Social History Narrative   Right handed    Caffeine use: soda sometimes   Lives with family   Social Determinants of Health   Financial Resource Strain:   . Difficulty of Paying Living Expenses: Not on file  Food Insecurity:   . Worried About Programme researcher, broadcasting/film/video in the Last Year: Not on file  . Ran Out of Food in the Last Year: Not on file  Transportation Needs:   . Lack of Transportation (Medical): Not on file  . Lack of Transportation (Non-Medical): Not on file  Physical Activity:   . Days of Exercise per Week: Not on file  . Minutes of Exercise per Session: Not on file   Stress:   . Feeling of Stress : Not on file  Social Connections:   . Frequency of Communication with Friends and Family: Not on file  . Frequency of Social Gatherings with Friends and Family: Not on file  . Attends Religious Services: Not on file  . Active Member of Clubs or Organizations: Not on file  . Attends Banker Meetings: Not on file  . Marital Status: Not on file  Intimate Partner Violence:   . Fear of Current or Ex-Partner: Not on file  . Emotionally Abused: Not on file  . Physically Abused: Not on file  . Sexually Abused: Not on file     PHYSICAL EXAM  Vitals:   06/19/20 1309  BP: 110/60  Pulse: 93  SpO2: 97%  Weight: 123 lb (55.8 kg)  Height:  (1.753 m)    Body mass index is 18.16 kg/m.   General: The patient is well-developed and well-nourished and in no acute distress  HEENT:  Head is [place]/AT.  Sclera are anicteric.  Funduscopic exam shows normal optic discs and retinal vessels.  Neck: No carotid bruits are noted.  The neck is nontender.  Cardiovascular: The heart has a regular rate and rhythm with a normal S1 and S2. There were no murmurs, gallops or rubs.    Skin: Extremities are without rash or  edema.  Musculoskeletal:  Back is nontender  Neurologic Exam  Mental status: The patient is alert and oriented x 3 at the time of the examination. The patient has apparent normal recent and remote memory, with an apparently normal attention span and concentration ability.   Speech is normal.  Cranial nerves: Extraocular movements are full. He has a right APD. Reduced color vision OD.  Marland Kitchen There is good facial sensation to soft touch bilaterally.Facial strength is normal.  Trapezius and sternocleidomastoid strength is normal. No dysarthria is noted.  The tongue is midline, and the patient has symmetric elevation of the soft palate. No obvious hearing deficits are noted.  Motor:  Muscle bulk is normal.   Tone is normal. Strength was 5/5 in the  arms and left leg, 4+/5 in the iliopsoas and ankle/toe extensors and 5/5 elsewhere in the right leg.  Sensory: Sensory testing is intact to pinprick, soft touch and vibration sensation in all 4 extremities.  Coordination: Cerebellar testing reveals reduced finger-nose-finger and reduced heel-to-shin bilaterally.  Gait and station: Station is  normal.   Gait is wide and ataxic.  He requires at least unilateral support.  He is using a walker.  He cannot do a tandem walk. Tandem gait is normal. Romberg is positive.   Reflexes: Deep tendon reflexes are symmetric 3 in the arms and 3+ at the knees where he has crossed abductor reflexes.  He has nonsustained clonus at the ankles.  However, the plantar responses were flexor.    DIAGNOSTIC DATA (LABS, IMAGING, TESTING) - I reviewed patient records, labs, notes, testing and imaging myself where available.  Lab Results  Component Value Date   WBC 10.9 (H) 06/02/2020   HGB 14.4 06/02/2020   HCT 41.9 06/02/2020   MCV 91.9 06/02/2020   PLT 210 06/02/2020      Component Value Date/Time   NA 140 06/02/2020 0516   K 4.3 06/02/2020 0516   CL 106 06/02/2020 0516   CO2 25 06/02/2020 0516   GLUCOSE 97 06/02/2020 0516   BUN 10 06/02/2020 0516   CREATININE 0.99 06/02/2020 0516   CALCIUM 9.5 06/02/2020 0516   PROT 5.4 (L) 05/26/2020 0525   ALBUMIN 3.2 (L) 05/26/2020 0525   ALBUMIN 4.6 12/12/2019 0002   AST 14 (L) 05/26/2020 0525   ALT 14 05/26/2020 0525   ALKPHOS 26 (L) 05/26/2020 0525   BILITOT 0.6 05/26/2020 0525   GFRNONAA >60 06/02/2020 0516   GFRAA >60 06/02/2020 0516   No results found for: CHOL, HDL, LDLCALC, LDLDIRECT, TRIG, CHOLHDL Lab Results  Component Value Date   HGBA1C 5.3 12/14/2019   Lab Results  Component Value Date   VITAMINB12 678 12/11/2019   No results found for: TSH     ASSESSMENT AND PLAN  MS (multiple sclerosis) (HCC) - Plan: Hepatitis B core antibody, total, Hepatitis B surface antigen, Stratify JCV Antibody  Test ([facility]), Hepatitis B surface antibody,qualitative, QuantiFERON-TB Gold Plus, HIV Antibody (routine testing w rflx), Varicella zoster antibody, IgG  High risk medication use - Plan: Hepatitis B core antibody, total, Hepatitis B surface antigen, Stratify JCV Antibody Test ([facility]), Hepatitis B surface antibody,qualitative, QuantiFERON-TB Gold Plus, HIV Antibody (routine testing w rflx), Varicella zoster antibody, IgG  Dysautonomia orthostatic hypotension syndrome (HCC)  Autism spectrum disorder  Poor balance  Depression, unspecified depression type   In summary, Mr. 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  He has had an additional relapse.  MRIs have shown multiple enhancing lesions.  Because he has an aggressive course of MS, it is necessary that he begin on a more highly effective disease modifying therapy.  We discussed, in detail, Tysabri, Ocrevus and Kesimpta.  He would prefer an infusion over a shot so we spent most of the time discussing Tysabri and Ocrevus.  If his JCV antibody negative, I recommend Tysabri as it is safest during the pandemic.  However, if he is JCV antibody positive the risks of Ocrevus would likely be lower than the risk of Tysabri and I do recommend that medication.  We will check for chronic infections and the JCV antibody.  He reports that his urinary urgency is doing better and he no longer has incontinence.  We will hold off on an anticholinergic at this time.  I will add sertraline 50 mg for his depression.  This may also help his appetite a bit.  They will return to see me in 3 months or sooner if he has new or worsening neurologic symptoms.  Thank you for asking me to see  Mr. Umbach.  Please let me know if I can be of further assistance with him or other patients in the future.   95-minute office visit with the majority of the time spent face-to-face for history and physical, discussion/counseling and  decision-making.  Additional time with record review (including reviewing multiple hospital stays, 5 MRI scans and labs) and documentation.   Alfonso Shackett A. Epimenio Foot, MD, [facility] 06/21/2020, 10:39 AM Certified in Neurology, Clinical Neurophysiology, Sleep Medicine and Neuroimaging  [facility] [address] [place], [place] [address] [phone]

## 2020-06-19 NOTE — Telephone Encounter (Signed)
Placed JCV lab in [facility] lock box for routine lab pick up. Results pending. 

## 2020-06-21 ENCOUNTER — Encounter: Payer: Self-pay | Admitting: Neurology

## 2020-06-21 LAB — HEPATITIS B CORE ANTIBODY, TOTAL: Hep B Core Total Ab: NEGATIVE

## 2020-06-21 LAB — QUANTIFERON-TB GOLD PLUS
QuantiFERON Mitogen Value: >10 IU/mL
QuantiFERON Nil Value: 0.09 IU/mL
QuantiFERON TB1 Ag Value: 0.13 IU/mL
QuantiFERON TB2 Ag Value: 0.17 IU/mL
QuantiFERON-TB Gold Plus: NEGATIVE

## 2020-06-21 LAB — HIV ANTIBODY (ROUTINE TESTING W REFLEX): HIV Screen 4th Generation wRfx: NONREACTIVE

## 2020-06-21 LAB — HEPATITIS B SURFACE ANTIGEN: Hepatitis B Surface Ag: NEGATIVE

## 2020-06-21 LAB — HEPATITIS B SURFACE ANTIBODY,QUALITATIVE: Hep B Surface Ab, Qual: REACTIVE

## 2020-06-21 LAB — VARICELLA ZOSTER ANTIBODY, IGG: Varicella zoster IgG: <135 index — ABNORMAL LOW (ref >165)

## 2020-07-02 NOTE — Telephone Encounter (Signed)
JCV ab drawn on 06/19/20 negative, index: 0.14.

## 2020-07-02 NOTE — Telephone Encounter (Signed)
We have not received JCV ab results yet. I called [facility] at [phone]. Requested results be faxed to fax on account: [phone]. Waiting on results to be faxed.

## 2020-07-02 NOTE — Telephone Encounter (Signed)
Tysabri is good --- I will sign the form in the morning if I have not yet signed

## 2020-07-04 NOTE — Telephone Encounter (Signed)
Tysabri Start Form faxed to [employer]. Received a receipt of confirmation.  Tysabri order for [facility] given to Dr. Epimenio Foot for review and signature.

## 2020-07-07 ENCOUNTER — Telehealth: Payer: Self-pay | Admitting: Neurology

## 2020-07-07 NOTE — Telephone Encounter (Signed)
[facility]) Infusion initial visit need to be scheduled by the nurse.

## 2020-07-07 NOTE — Telephone Encounter (Signed)
The pt did not initial several places on the tysabri enrollment form and therefore [employer] cannot process it.  I called pt to discuss. No answer, VM not set up yet. Will try again later.

## 2020-07-07 NOTE — Telephone Encounter (Signed)
I have already given the pt's mother the phone number to the [facility].

## 2020-07-07 NOTE — Telephone Encounter (Signed)
I called pt. Spoke to pt's mother, per DPR. She will come by today to sign the missing portion on the pt's tysabri start form. She will call the [facility] at [phone] to schedule the tysabri infusion.

## 2020-07-08 NOTE — Telephone Encounter (Signed)
Pt has not yet come by the office as discussed to sign tysabri form.   I called pt's mother. No answer, VM not available. Will try again later.

## 2020-07-14 ENCOUNTER — Emergency Department (HOSPITAL_COMMUNITY)
Admission: EM | Admit: 2020-07-14 | Discharge: 2020-07-15 | Disposition: A | Discharge status: Home / Self Care | Payer: Medicaid Other | Attending: Emergency Medicine | Admitting: Emergency Medicine

## 2020-07-14 DIAGNOSIS — Z5321 Procedure and treatment not carried out due to patient leaving prior to being seen by health care provider: Secondary | ICD-10-CM | POA: Insufficient documentation

## 2020-07-14 DIAGNOSIS — F32A Depression, unspecified: Secondary | ICD-10-CM | POA: Diagnosis present

## 2020-07-14 DIAGNOSIS — Z820 Family history of epilepsy and other diseases of the nervous system: Secondary | ICD-10-CM

## 2020-07-14 DIAGNOSIS — G4700 Insomnia, unspecified: Secondary | ICD-10-CM | POA: Diagnosis present

## 2020-07-14 DIAGNOSIS — K219 Gastro-esophageal reflux disease without esophagitis: Secondary | ICD-10-CM | POA: Diagnosis present

## 2020-07-14 DIAGNOSIS — Z79899 Other long term (current) drug therapy: Secondary | ICD-10-CM

## 2020-07-14 DIAGNOSIS — G901 Familial dysautonomia [Riley-Day]: Secondary | ICD-10-CM | POA: Diagnosis present

## 2020-07-14 DIAGNOSIS — I951 Orthostatic hypotension: Secondary | ICD-10-CM | POA: Diagnosis present

## 2020-07-14 DIAGNOSIS — R251 Tremor, unspecified: Secondary | ICD-10-CM | POA: Diagnosis present

## 2020-07-14 DIAGNOSIS — I498 Other specified cardiac arrhythmias: Secondary | ICD-10-CM | POA: Diagnosis present

## 2020-07-14 DIAGNOSIS — R1310 Dysphagia, unspecified: Secondary | ICD-10-CM | POA: Diagnosis present

## 2020-07-14 DIAGNOSIS — K5900 Constipation, unspecified: Secondary | ICD-10-CM | POA: Diagnosis present

## 2020-07-14 DIAGNOSIS — F840 Autistic disorder: Secondary | ICD-10-CM | POA: Diagnosis present

## 2020-07-14 DIAGNOSIS — T380X5A Adverse effect of glucocorticoids and synthetic analogues, initial encounter: Secondary | ICD-10-CM | POA: Diagnosis present

## 2020-07-14 DIAGNOSIS — G35 Multiple sclerosis: Secondary | ICD-10-CM | POA: Insufficient documentation

## 2020-07-14 DIAGNOSIS — H5509 Other forms of nystagmus: Secondary | ICD-10-CM | POA: Diagnosis present

## 2020-07-14 DIAGNOSIS — R262 Difficulty in walking, not elsewhere classified: Secondary | ICD-10-CM | POA: Insufficient documentation

## 2020-07-14 DIAGNOSIS — Z20822 Contact with and (suspected) exposure to covid-19: Secondary | ICD-10-CM | POA: Diagnosis present

## 2020-07-14 DIAGNOSIS — R32 Unspecified urinary incontinence: Secondary | ICD-10-CM | POA: Diagnosis present

## 2020-07-14 DIAGNOSIS — R531 Weakness: Secondary | ICD-10-CM | POA: Insufficient documentation

## 2020-07-14 NOTE — Telephone Encounter (Signed)
Pt still has not come by the office as discussed to sign the tysabri form.  I called pt's mother, per DPR. She reports that she has been busy and unable to come to the office to sign the tysabri start form but she will do it it today. She reports that she plans on taking pt to the hospital today because he has twitching in his hands and gait changes. I reiterated the importance of the tysabri to hopefully prevent further progression of his MS. Until the tysabri start form is completed we cannot send the start form back to [employer] to get him started on tysabri. She confirmed she will come by the office today to finish this start form.

## 2020-07-14 NOTE — ED Triage Notes (Signed)
Pt here from home with mom stating that the pt is having another MS flare up . Mostly with weakness and difficulty walking pt is followed by neurology

## 2020-07-15 ENCOUNTER — Encounter (HOSPITAL_COMMUNITY): Payer: Self-pay | Admitting: Emergency Medicine

## 2020-07-15 ENCOUNTER — Inpatient Hospital Stay (HOSPITAL_COMMUNITY)
Admission: EM | Admit: 2020-07-15 | Discharge: 2020-07-19 | DRG: 059 | Disposition: A | Discharge status: Rehabilitation Facility | Payer: Medicaid Other | Attending: Family Medicine | Admitting: Family Medicine

## 2020-07-15 ENCOUNTER — Emergency Department (HOSPITAL_COMMUNITY): Payer: Medicaid Other

## 2020-07-15 ENCOUNTER — Other Ambulatory Visit: Payer: Self-pay

## 2020-07-15 DIAGNOSIS — Z20822 Contact with and (suspected) exposure to covid-19: Secondary | ICD-10-CM | POA: Diagnosis present

## 2020-07-15 DIAGNOSIS — F32A Depression, unspecified: Secondary | ICD-10-CM | POA: Diagnosis present

## 2020-07-15 DIAGNOSIS — R739 Hyperglycemia, unspecified: Secondary | ICD-10-CM

## 2020-07-15 DIAGNOSIS — D72828 Other elevated white blood cell count: Secondary | ICD-10-CM | POA: Diagnosis not present

## 2020-07-15 DIAGNOSIS — T380X5A Adverse effect of glucocorticoids and synthetic analogues, initial encounter: Secondary | ICD-10-CM | POA: Diagnosis present

## 2020-07-15 DIAGNOSIS — Z820 Family history of epilepsy and other diseases of the nervous system: Secondary | ICD-10-CM | POA: Diagnosis not present

## 2020-07-15 DIAGNOSIS — R251 Tremor, unspecified: Secondary | ICD-10-CM | POA: Diagnosis present

## 2020-07-15 DIAGNOSIS — F840 Autistic disorder: Secondary | ICD-10-CM | POA: Diagnosis present

## 2020-07-15 DIAGNOSIS — R531 Weakness: Secondary | ICD-10-CM | POA: Diagnosis not present

## 2020-07-15 DIAGNOSIS — R32 Unspecified urinary incontinence: Secondary | ICD-10-CM | POA: Diagnosis present

## 2020-07-15 DIAGNOSIS — I498 Other specified cardiac arrhythmias: Secondary | ICD-10-CM | POA: Diagnosis present

## 2020-07-15 DIAGNOSIS — G35 Multiple sclerosis: Secondary | ICD-10-CM | POA: Diagnosis present

## 2020-07-15 DIAGNOSIS — G901 Familial dysautonomia [Riley-Day]: Secondary | ICD-10-CM | POA: Diagnosis present

## 2020-07-15 DIAGNOSIS — E876 Hypokalemia: Secondary | ICD-10-CM | POA: Diagnosis not present

## 2020-07-15 DIAGNOSIS — G4700 Insomnia, unspecified: Secondary | ICD-10-CM | POA: Diagnosis present

## 2020-07-15 DIAGNOSIS — Z79899 Other long term (current) drug therapy: Secondary | ICD-10-CM | POA: Diagnosis not present

## 2020-07-15 DIAGNOSIS — K5900 Constipation, unspecified: Secondary | ICD-10-CM | POA: Diagnosis present

## 2020-07-15 DIAGNOSIS — R29898 Other symptoms and signs involving the musculoskeletal system: Secondary | ICD-10-CM | POA: Diagnosis not present

## 2020-07-15 DIAGNOSIS — K219 Gastro-esophageal reflux disease without esophagitis: Secondary | ICD-10-CM | POA: Diagnosis present

## 2020-07-15 DIAGNOSIS — H5509 Other forms of nystagmus: Secondary | ICD-10-CM | POA: Diagnosis present

## 2020-07-15 DIAGNOSIS — R1310 Dysphagia, unspecified: Secondary | ICD-10-CM | POA: Diagnosis present

## 2020-07-15 DIAGNOSIS — I951 Orthostatic hypotension: Secondary | ICD-10-CM | POA: Diagnosis present

## 2020-07-15 LAB — URINALYSIS, ROUTINE W REFLEX MICROSCOPIC
Bilirubin Urine: NEGATIVE
Glucose, UA: NEGATIVE mg/dL
Hgb urine dipstick: NEGATIVE
Ketones, ur: NEGATIVE mg/dL
Leukocytes,Ua: NEGATIVE
Nitrite: NEGATIVE
Protein, ur: NEGATIVE mg/dL
Specific Gravity, Urine: 1.019 (ref 1.005–1.030)
pH: 6 (ref 5.0–8.0)

## 2020-07-15 LAB — BASIC METABOLIC PANEL
Anion gap: 10 (ref 5–15)
BUN: 17 mg/dL (ref 6–20)
CO2: 26 mmol/L (ref 22–32)
Calcium: 9.8 mg/dL (ref 8.9–10.3)
Chloride: 105 mmol/L (ref 98–111)
Creatinine, Ser: 0.82 mg/dL (ref 0.61–1.24)
GFR, Estimated: >60 mL/min (ref >60)
Glucose, Bld: 88 mg/dL (ref 70–99)
Potassium: 3.6 mmol/L (ref 3.5–5.1)
Sodium: 141 mmol/L (ref 135–145)

## 2020-07-15 LAB — CBC WITH DIFFERENTIAL/PLATELET
Abs Immature Granulocytes: 0.03 10*3/uL (ref 0.00–0.07)
Basophils Absolute: 0.1 10*3/uL (ref 0.0–0.1)
Basophils Relative: 1 %
Eosinophils Absolute: 0.1 10*3/uL (ref 0.0–0.5)
Eosinophils Relative: 1 %
HCT: 44 % (ref 39.0–52.0)
Hemoglobin: 14.6 g/dL (ref 13.0–17.0)
Immature Granulocytes: 0 %
Lymphocytes Relative: 14 %
Lymphs Abs: 1.4 10*3/uL (ref 0.7–4.0)
MCH: 31.3 pg (ref 26.0–34.0)
MCHC: 33.2 g/dL (ref 30.0–36.0)
MCV: 94.2 fL (ref 80.0–100.0)
Monocytes Absolute: 0.9 10*3/uL (ref 0.1–1.0)
Monocytes Relative: 9 %
Neutro Abs: 7.3 10*3/uL (ref 1.7–7.7)
Neutrophils Relative %: 75 %
Platelets: 232 10*3/uL (ref 150–400)
RBC: 4.67 MIL/uL (ref 4.22–5.81)
RDW: 14.2 % (ref 11.5–15.5)
WBC: 9.7 10*3/uL (ref 4.0–10.5)
nRBC: 0 % (ref 0.0–0.2)

## 2020-07-15 LAB — RESPIRATORY PANEL BY RT PCR (FLU A&B, COVID)
Influenza A by PCR: NEGATIVE
Influenza B by PCR: NEGATIVE
SARS Coronavirus 2 by RT PCR: NEGATIVE

## 2020-07-15 MED ORDER — SODIUM CHLORIDE 0.9 % IV SOLN
1000.0000 mg | INTRAVENOUS | Status: AC
  Administered 2020-07-16 – 2020-07-19 (×4): 1000 mg via INTRAVENOUS
  Filled 2020-07-15 (×4): qty 8

## 2020-07-15 MED ORDER — POLYETHYLENE GLYCOL 3350 17 G PO PACK
17.0000 g | PACK | Freq: Every day | ORAL | Status: DC | PRN
  Administered 2020-07-16: 17 g via ORAL
  Filled 2020-07-15: qty 1

## 2020-07-15 MED ORDER — MIDODRINE HCL 5 MG PO TABS: 7.5000 mg | ORAL_TABLET | Freq: Three times a day (TID) | ORAL | Status: DC

## 2020-07-15 MED ORDER — MELATONIN 3 MG PO TABS
6.0000 mg | ORAL_TABLET | Freq: Every evening | ORAL | Status: DC | PRN
  Filled 2020-07-15: qty 2

## 2020-07-15 MED ORDER — SODIUM CHLORIDE 0.9 % IV SOLN
1000.0000 mg | Freq: Once | INTRAVENOUS | Status: DC
  Administered 2020-07-15: 1000 mg via INTRAVENOUS
  Filled 2020-07-15: qty 8

## 2020-07-15 MED ORDER — GADOBUTROL 1 MMOL/ML IV SOLN
5.5000 mL | Freq: Once | INTRAVENOUS | Status: AC | PRN
  Administered 2020-07-15: 5.5 mL via INTRAVENOUS

## 2020-07-15 MED ORDER — PANTOPRAZOLE SODIUM 40 MG PO TBEC: 40.0000 mg | DELAYED_RELEASE_TABLET | Freq: Every day | ORAL | Status: DC

## 2020-07-15 MED ORDER — MIDODRINE HCL 5 MG PO TABS
7.5000 mg | ORAL_TABLET | Freq: Three times a day (TID) | ORAL | Status: DC
  Administered 2020-07-15 – 2020-07-19 (×14): 7.5 mg via ORAL
  Filled 2020-07-15 (×3): qty 2
  Filled 2020-07-15: qty 1
  Filled 2020-07-15 (×11): qty 2

## 2020-07-15 MED ORDER — ENOXAPARIN SODIUM 40 MG/0.4ML ~~LOC~~ SOLN
40.0000 mg | SUBCUTANEOUS | Status: DC
  Administered 2020-07-15 – 2020-07-18 (×4): 40 mg via SUBCUTANEOUS
  Filled 2020-07-15 (×4): qty 0.4

## 2020-07-15 MED ORDER — PANTOPRAZOLE SODIUM 40 MG PO TBEC
40.0000 mg | DELAYED_RELEASE_TABLET | Freq: Every day | ORAL | Status: DC
  Administered 2020-07-15 – 2020-07-19 (×5): 40 mg via ORAL
  Filled 2020-07-15 (×5): qty 1

## 2020-07-15 NOTE — Consult Note (Signed)
NEUROLOGY CONSULTATION NOTE   Date of service: July 15, 2020 Patient Name: Jaime [name] MRN:  [id number] DOB:  [birth year] Reason for consult: "concern for MS Flare"  History of Present Illness  Jaime [name] is a 24 y.o. male with PMH significant for autism, depression and verry aggressive relapsing remitting multiple sclerosis who presents with weakness. Left worse than right with difficulty walking and dragging hi L foot. He also has worsening tremor.  He was evaluated by our team inpatient in August for an MS flare up. MRI showed multiple active demyelinating lesions. He was not on any disease modifying therapy. He was given IV solumedrol x 5 days. He followed with Dr. Epimenio Foot in sept. He was to start on Tysabri but in the meantime had worsening symptoms and came to the ED.   ROS   Constitutional Denies weight loss, fever and chills.  HEENT Denies changes in vision and hearing.  Respiratory Denies SOB and cough.  CV Denies palpitations and CP  GI Denies abdominal pain, nausea, vomiting and diarrhea.  GU Denies dysuria and urinary frequency.  MSK Denies myalgia and joint pain.  Skin Denies rash and pruritus.  Neurological Denies headache and syncope.  Psychiatric Denies recent changes in mood. Denies anxiety and depression.   Past History   Past Medical History:  Diagnosis Date  . Autism   . Depression   . MS (multiple sclerosis) (HCC)    Past Surgical History:  Procedure Laterality Date  . NO PAST SURGERIES     Family History  Problem Relation Age of Onset  . Clotting disorder Mother    Social History   Socioeconomic History  . Marital status: Single    Spouse name: Not on file  . Number of children: Not on file  . Years of education: Not on file  . Highest education level: Not on file  Occupational History  . Not on file  Tobacco Use  . Smoking status: Never Smoker  . Smokeless tobacco: Never Used  Vaping Use  . Vaping Use: Never used  Substance  and Sexual Activity  . Alcohol use: No  . Drug use: Never  . Sexual activity: Not Currently  Other Topics Concern  . Not on file  Social History Narrative   Right handed    Caffeine use: soda sometimes   Lives with family   Social Determinants of Health   Financial Resource Strain:   . Difficulty of Paying Living Expenses: Not on file  Food Insecurity:   . Worried About Programme researcher, broadcasting/film/video in the Last Year: Not on file  . Ran Out of Food in the Last Year: Not on file  Transportation Needs:   . Lack of Transportation (Medical): Not on file  . Lack of Transportation (Non-Medical): Not on file  Physical Activity:   . Days of Exercise per Week: Not on file  . Minutes of Exercise per Session: Not on file  Stress:   . Feeling of Stress : Not on file  Social Connections:   . Frequency of Communication with Friends and Family: Not on file  . Frequency of Social Gatherings with Friends and Family: Not on file  . Attends Religious Services: Not on file  . Active Member of Clubs or Organizations: Not on file  . Attends Banker Meetings: Not on file  . Marital Status: Not on file   No Known Allergies  Medications  (Not in a hospital admission)    Vitals  Vitals:   07/15/20 0644 07/15/20 0748 07/15/20 0800 07/15/20 0900  BP: 114/71 113/76 107/75 109/68  Pulse: 81 73 66 61  Resp: 16 14    Temp: 98.6 F (37 C) 98.2 F (36.8 C)    TempSrc: Oral Oral    SpO2: 99% 100% 100% 100%     There is no height or weight on file to calculate BMI.  Physical Exam   General: Laying comfortably in bed; in no acute distress. HENT: Normal oropharynx and mucosa. Normal external appearance of ears and nose. Neck: Supple, no pain or tenderness CV: No JVD. No peripheral edema. Pulmonary: Symmetric Chest rise. Normal respiratory effort. Abdomen: Soft to touch, non-tender. Ext: No cyanosis, edema, or deformity Skin: No rash. Normal palpation of skin.  Musculoskeletal: Normal  digits and nails by inspection. No clubbing.  Neurologic Examination  Mental status/Cognition: Alert, oriented to self, place, month and year, good attention. Speech/language: Fluent, comprehension intact, object naming intact, repetition intact. Cranial nerves:   CN II Pupils equal and reactive to light, no VF deficits   CN III,IV,VI EOM intact, no gaze preference or deviation, gaze evoked nystagmus that is direction changing.   CN V normal sensation in V1, V2, and V3 segments bilaterally   CN VII no asymmetry, no nasolabial fold flattening   CN VIII normal hearing to speech   CN IX & X normal palatal elevation, no uvular deviation   CN XI 5/5 head turn and 5/5 shoulder shrug bilaterally   CN XII midline tongue protrusion   Motor:  Muscle bulk: normal, tone increased, pronator drift none tremor yes but more ataxia than tremor. Mvmt Root Nerve  Muscle Right Left Comments  SA C5/6 Ax Deltoid 4+ 5   EF C5/6 Mc Biceps 5 5   EE C6/7/8 Rad Triceps 5 5   WF C6/7 Med FCR 5 5   WE C7/8 PIN ECU 5 5   F Ab C8/T1 U ADM/FDI 4+ 5   HF L1/2/3 Fem Illopsoas 4+ 4+   KE L2/3/4 Fem Quad 4+ 4+   DF L4/5 D Peron Tib Ant 4+ 2   PF S1/2 Tibial Grc/Sol 4 2    Reflexes:  Right Left Comments  Pectoralis + +    Biceps (C5/6) 3 3   Brachioradialis (C5/6) 3 3    Triceps (C6/7) 3 3    Patellar (L3/4) 3 3 Cross adductors +   Achilles (S1) 4 4 With 2-3 beats of clonus on the R, sustained clonus on the left.   Hoffman      Plantar     Jaw jerk    Sensation:  Light touch Intact throughout   Pin prick Intact throughout.   Temperature    Vibration   Proprioception    Coordination/Complex Motor:  - Finger to Nose with ataxia BL and poor coordination. - Heel to shin unable to do due to weakness, notable for incoordination too. - Rapid alternating movement are slowed.  Labs   CBC:  Recent Labs  Lab 07/15/20 0829  WBC 9.7  NEUTROABS 7.3  HGB 14.6  HCT 44.0  MCV 94.2  PLT 232    Basic  Metabolic Panel:  Lab Results  Component Value Date   NA 141 07/15/2020   K 3.6 07/15/2020   CO2 26 07/15/2020   GLUCOSE 88 07/15/2020   BUN 17 07/15/2020   CREATININE 0.82 07/15/2020   CALCIUM 9.8 07/15/2020   GFRNONAA >60 07/15/2020   GFRAA >60 06/02/2020   Lipid  Panel: No results found for: LDLCALC HgbA1c:  Lab Results  Component Value Date   HGBA1C 5.3 12/14/2019   Urine Drug Screen: No results found for: LABOPIA, COCAINSCRNUR, LABBENZ, AMPHETMU, THCU, LABBARB  Alcohol Level No results found for: [facility]  MRI Brain w + wo C Mildly motion degraded examination.  Numerous (numbering at least 15-20) subcentimeter enhancing lesions within the juxtacortical and subcortical white matter of the bilateral cerebral hemispheres. The majority of, if not all of, these enhancing lesions appear new as compared to the MRI of 05/19/2020 and are consistent with sites of active demyelination.  As before, there is background severe multifocal T2 hyperintense signal abnormality within the cerebral white matter, corpus callosum, thalami, brainstem, cerebellum and middle cerebellar peduncles. These findings are consistent with the provided history of multiple sclerosis.  MRI C and T spine w + wo C: Similar to the prior MRI of 12/11/2019, there is extensive multifocal T2 hyperintense signal abnormality affecting the majority of the cervical spinal cord. There is associated mild diffuse cord atrophy and these findings are consistent with the provided history of multiple sclerosis. Within the limitations of motion degradation, no abnormal enhancement of the cervical spinal cord is identified to suggest active demyelination.  Extensive multifocal T2 hyperintense signal abnormality affecting the majority of the thoracic spinal cord. There are T2 hyperintense lesions at the T4, T5, T6, T7-T8 and T11 levels which are new or significantly progressed as compared to the MRI of 12/16/2019.  These findings are consistent with the provided history of demyelinating disease/multiple sclerosis. There is questionable enhancement corresponding with the T4 and T5 level lesions and this may reflect active demyelination.   Impression   Jaime [name] is a 24 y.o. male with PMH significant for autism, depression and verry aggressive relapsing remitting multiple sclerosis who presents with weakness. Left worse than right with difficulty walking and dragging hi L foot. He also has worsening tremor. His neurologic examination is notable for weakness in BL lower extremities with L > R with hyperreflexia. MRI Neuro axis with several new and some actively enhancing lesions and progression of lesions in his T spine with ? Enhancement at T4-5.  Recommendations  - IV methylprednisone 1000mg  Q24 hours x 5 days with Protonix. May consider a short Prednisone taper afterward depending on how he responds. - Would recommend helping out with getting an early appointment for Tysabri infusion after discharge. Patient's mom to work on some paperwork for this today. - PT/OT to assess for potential inpatient rehab. ______________________________________________________________________  Thank you for the opportunity to take part in the care of this patient. If you have any further questions, please contact the neurology consultation attending.  Signed,  [facility] Pager Number [phone]

## 2020-07-15 NOTE — ED Provider Notes (Signed)
[facility] EMERGENCY DEPARTMENT Provider Note   CSN: [id number] Arrival date & time: 07/14/20  1341     History CC: Weakness  Jaime [name] is a 24 y.o. male w/ hx of autism, MS, presenting to ED with leg weakness and arm weakness.  His mother reports onset of symptoms 3-4 days ago.  Patient normally walks with assistance, but is now having worsening weakness in his lower legs, left leg > right leg.  He also has a worsening tremor.  They were worried about another MS flare and came to the ED yesterday.  Unfortunately due to prolonged waiting times related to the pandemic, they spent 17 hours in the [place] waiting room prior to being roomed and evaluated by myself on 07/15/20 at 0800.  At this time he continues to have the same symptoms without any worsening or new headache, vision changes, CP, fevers, or chills.  He is not vaccinated for covid yet.  He is not on MS medications at home according to his mother.  His last course of steroids was during his last hospitalization in Sept 2021, where he received IV solumedrol x 5 doses.  He follows with Dr Epimenio Foot from neurology.  He is on midodrine 7.5 mg TID for hypotension and is due for morning dose - ran out of this medication at home.  He is also on protonix.    HPI     Past Medical History:  Diagnosis Date  . Autism   . Depression   . MS (multiple sclerosis) [facility])     Patient Active Problem List   Diagnosis Date Noted  . Multiple sclerosis (HCC) 07/15/2020  . Depression   . Leucocytosis   . Exacerbation of multiple sclerosis (HCC) 05/20/2020  . POTS (postural orthostatic tachycardia syndrome) 05/20/2020  . Poor balance 01/09/2020  . Autism spectrum disorder   . Tachycardia   . Dysautonomia orthostatic hypotension syndrome   . Multiple sclerosis exacerbation (HCC) 12/17/2019  . Lower extremity weakness 12/12/2019  . Demyelinating disease of central nervous system (HCC) 12/12/2019  . MS (multiple  sclerosis) (HCC) 12/12/2019    Past Surgical History:  Procedure Laterality Date  . NO PAST SURGERIES         Family History  Problem Relation Age of Onset  . Clotting disorder Mother     Social History   Tobacco Use  . Smoking status: Never Smoker  . Smokeless tobacco: Never Used  Vaping Use  . Vaping Use: Never used  Substance Use Topics  . Alcohol use: No  . Drug use: Never    Home Medications Prior to Admission medications   Medication Sig Start Date End Date Taking? Authorizing Provider  midodrine (PROAMATINE) 2.5 MG tablet Take 3 tablets (7.5 mg total) by mouth 3 (three) times daily with meals. 06/04/20  Yes Angiulli, Mcarthur Rossetti, PA-C  pantoprazole (PROTONIX) 40 MG tablet Take 1 tablet (40 mg total) by mouth daily. 06/04/20  Yes Angiulli, Mcarthur Rossetti, PA-C  acetaminophen (TYLENOL) 325 MG tablet Take 2 tablets (650 mg total) by mouth every 4 (four) hours as needed for mild pain or fever (or Fever >/= 101). Patient not taking: Reported on 07/15/2020 06/04/20   Angiulli, Mcarthur Rossetti, PA-C  melatonin 3 MG TABS tablet Take 2 tablets (6 mg total) by mouth at bedtime. Patient not taking: Reported on 06/19/2020 06/04/20   Angiulli, Mcarthur Rossetti, PA-C  senna (SENOKOT) 8.6 MG TABS tablet Take 2 tablets (17.2 mg total) by mouth 2 (two)  times daily at 10 AM and 5 PM. Patient not taking: Reported on 07/15/2020 06/04/20   Angiulli, Mcarthur Rossetti, PA-C  sertraline (ZOLOFT) 50 MG tablet Take 1 tablet (50 mg total) by mouth daily. Patient not taking: Reported on 07/15/2020 06/19/20   Asa Lente, MD    Allergies    Patient has no known allergies.  Review of Systems   Review of Systems  Constitutional: Positive for fatigue. Negative for chills and fever.  Eyes: Negative for photophobia, pain, redness and visual disturbance.  Respiratory: Negative for cough and shortness of breath.   Cardiovascular: Negative for chest pain and palpitations.  Gastrointestinal: Negative for abdominal pain, nausea and  vomiting.  Genitourinary: Negative for dysuria and hematuria.  Musculoskeletal: Negative for arthralgias and back pain.  Skin: Positive for rash. Negative for wound.  Neurological: Positive for weakness and numbness. Negative for syncope, facial asymmetry, light-headedness and headaches.  Psychiatric/Behavioral: Negative for agitation and confusion.  All other systems reviewed and are negative.   Physical Exam Updated Vital Signs BP 114/76   Pulse (!) 59   Temp 98.2 F (36.8 C) (Oral)   Resp 14   SpO2 99%   Physical Exam Vitals and nursing note reviewed.  Constitutional:      Appearance: He is well-developed.  HENT:     Head: Normocephalic and atraumatic.  Eyes:     Extraocular Movements: Extraocular movements intact.     Conjunctiva/sclera: Conjunctivae normal.     Pupils: Pupils are equal, round, and reactive to light.  Cardiovascular:     Rate and Rhythm: Normal rate and regular rhythm.     Pulses: Normal pulses.  Pulmonary:     Effort: Pulmonary effort is normal. No respiratory distress.     Breath sounds: Normal breath sounds.  Abdominal:     General: There is no distension.     Palpations: Abdomen is soft.     Tenderness: There is no abdominal tenderness.  Musculoskeletal:     Cervical back: Neck supple.  Skin:    General: Skin is warm and dry.     Comments: Small 1 cm cyst-like lesion on medial aspect of left forearm (near prior IV site) that is non-tender, no fluctuance, no palpbale cord  Neurological:     Mental Status: He is alert and oriented to person, place, and time. Mental status is at baseline.     Motor: Motor function is intact.     Comments: Strength 4/5 in left lower extremity (hip flexion, knee extension) Strength 5/5 in right lower extremity Pt reports bilateral LE paresthesias most notably in feet Grip strength equal bilaterally No pronator drift  Psychiatric:        Mood and Affect: Mood normal.        Behavior: Behavior normal.     ED  Results / Procedures / Treatments   Labs (all labs ordered are listed, but only abnormal results are displayed) Labs Reviewed  RESPIRATORY PANEL BY RT PCR (FLU A&B, COVID)  URINALYSIS, ROUTINE W REFLEX MICROSCOPIC  BASIC METABOLIC PANEL  CBC WITH DIFFERENTIAL/PLATELET  BASIC METABOLIC PANEL  CBC    EKG None  Radiology MR Brain W and Wo Contrast  Result Date: 07/15/2020 CLINICAL DATA:  Multiple sclerosis, new event; new left leg weakness. EXAM: MRI HEAD WITHOUT AND WITH CONTRAST TECHNIQUE: Multiplanar, multiecho pulse sequences of the brain and surrounding structures were obtained without and with intravenous contrast. CONTRAST:  5.2mL GADAVIST GADOBUTROL 1 MMOL/ML IV SOLN COMPARISON:  Brain MRI 05/19/2020.  FINDINGS: Brain: Mild intermittent motion degradation. This includes mild motion degradation of the axial T1 weighted postcontrast sequence. There are numerous subcentimeter enhancing lesions within the juxtacortical and subcortical white matter of the bilateral cerebral hemispheres (at least 15-20 in number) (see annotations on series 32, image 34). The majority of, if not all of, these enhancing lesions appear new as compared to the prior examination of 05/19/2020. As before, there is background severe multifocal T2/FLAIR hyperintense signal abnormality within the subcortical/juxtacortical and deep cerebral white matter, corpus callosum, thalami, brainstem, cerebellum and middle cerebellar peduncles. No evidence of intracranial mass. No chronic intracranial blood products. No extra-axial fluid collection. No midline shift. Vascular: Expected proximal arterial flow voids. Skull and upper cervical spine: No focal marrow lesion. Sinuses/Orbits: Visualized orbits show no acute finding. Trace scattered paranasal sinus mucosal thickening. No significant mastoid effusion. IMPRESSION: Mildly motion degraded examination. Numerous (numbering at least 15-20) subcentimeter enhancing lesions within the  juxtacortical and subcortical white matter of the bilateral cerebral hemispheres. The majority of, if not all of, these enhancing lesions appear new as compared to the MRI of 05/19/2020 and are consistent with sites of active demyelination. As before, there is background severe multifocal T2 hyperintense signal abnormality within the cerebral white matter, corpus callosum, thalami, brainstem, cerebellum and middle cerebellar peduncles. These findings are consistent with the provided history of multiple sclerosis. Electronically Signed   By: Jackey Loge DO   On: 07/15/2020 15:09   MR Cervical Spine W or Wo Contrast  Result Date: 07/15/2020 CLINICAL DATA:  Multiple sclerosis, new event. Additional provided: Weakness, difficulty walking. EXAM: MRI CERVICAL SPINE WITHOUT AND WITH CONTRAST TECHNIQUE: Multiplanar and multiecho pulse sequences of the cervical spine, to include the craniocervical junction and cervicothoracic junction, were obtained without and with intravenous contrast. CONTRAST:  5.7mL GADAVIST GADOBUTROL 1 MMOL/ML IV SOLN COMPARISON:  Cervical spine MRI 12/11/2019. FINDINGS: The examination is intermittently motion degraded, limiting evaluation. Most notably, there is moderate motion degradation of the axial T2 GRE sequence, moderate motion degradation of the axial T2 weighted sequence and moderate/severe motion degradation of the postcontrast axial T1 weighted sequence. Alignment: Straightening of the expected cervical lordosis. No significant spondylolisthesis. Vertebrae: Vertebral body height is maintained. No focal suspicious osseous lesion or significant marrow edema. Cord: Similar to the prior MRI of 12/11/2019, there is extensive multifocal T2/STIR hyperintense signal abnormality throughout the majority of the cervical spinal cord. Within the limitations of motion degradation, no abnormal cord enhancement is identified to suggest active demyelination. There is associated mild diffuse atrophy  of the cervical spinal cord. Posterior Fossa, vertebral arteries, paraspinal tissues: Posterior fossa better assessed on the concurrently performed brain MRI. Flow voids preserved within the imaged cervical vertebral arteries. Paraspinal soft tissues within normal limits. Disc levels: Intervertebral disc height is maintained. No significant disc herniation, spinal canal stenosis or neural foraminal narrowing at any level. IMPRESSION: Motion degraded examination as described and limiting evaluation. Similar to the prior MRI of 12/11/2019, there is extensive multifocal T2 hyperintense signal abnormality affecting the majority of the cervical spinal cord. There is associated mild diffuse cord atrophy and these findings are consistent with the provided history of multiple sclerosis. Within the limitations of motion degradation, no abnormal enhancement of the cervical spinal cord is identified to suggest active demyelination. Electronically Signed   By: Jackey Loge DO   On: 07/15/2020 15:19   MR THORACIC SPINE W WO CONTRAST  Result Date: 07/15/2020 CLINICAL DATA:  Demyelinating disease; MS flare suspected, new left leg  weakness. EXAM: MRI THORACIC WITHOUT AND WITH CONTRAST TECHNIQUE: Multiplanar and multiecho pulse sequences of the thoracic spine were obtained without and with intravenous contrast. CONTRAST:  5.73mL GADAVIST GADOBUTROL 1 MMOL/ML IV SOLN COMPARISON:  Thoracic spine MRI 12/16/2019. FINDINGS: MRI THORACIC SPINE FINDINGS Alignment:  No significant spondylolisthesis. Vertebrae: Vertebral body height is maintained. No suspicious osseous lesion or significant marrow edema. Cord: Again demonstrated is extensive multifocal T2/STIR hyperintense signal abnormality throughout the majority of the thoracic spinal cord. There are several lesions which are new or significantly progressed as compared to the prior examination of 12/16/2019 as follows. A lesion within the central cord at the T4 level (series 23, image  10). A lesion within the right dorsal lateral aspect of the cord at the T5 level (series 23, image 13). A lesion within the right aspect of the spinal cord at the T6 level (series 23, image 16). A lesion within the central cord at the T7-T8 level (series 23, image 19). A lesion within the central cord at the T11 level (series 23, image 30). Faint contrast enhancement is questioned at the sites of the T4 and T5 level lesions and this reflect active demyelination (series 26, image 11). No definite abnormal enhancement is identified elsewhere within the thoracic cord. Paraspinal and other soft tissues: No abnormality identified within included portions of the thorax or upper abdomen. Disc levels: At T4-T5, there is unchanged tiny central disc protrusion which partially effaces the ventral thecal sac and contacts the ventral spinal cord. No significant central canal stenosis. At T6-T7, there is an unchanged small central disc protrusion which partially effaces the ventral thecal sac and contacts the ventral spinal cord. No significant spinal canal stenosis. No significant foraminal narrowing at any level. IMPRESSION: Extensive multifocal T2 hyperintense signal abnormality affecting the majority of the thoracic spinal cord. There are T2 hyperintense lesions at the T4, T5, T6, T7-T8 and T11 levels which are new or significantly progressed as compared to the MRI of 12/16/2019. These findings are consistent with the provided history of demyelinating disease/multiple sclerosis. There is questionable enhancement corresponding with the T4 and T5 level lesions and this may reflect active demyelination. Electronically Signed   By: Jackey Loge DO   On: 07/15/2020 15:38    Procedures Procedures (including critical care time)  Medications Ordered in ED Medications  midodrine (PROAMATINE) tablet 7.5 mg (7.5 mg Oral Given 07/15/20 0837)  pantoprazole (PROTONIX) EC tablet 40 mg (40 mg Oral Given 07/15/20 0837)    methylPREDNISolone sodium succinate (SOLU-MEDROL) 1,000 mg in sodium chloride 0.9 % 50 mL IVPB (has no administration in time range)  enoxaparin (LOVENOX) injection 40 mg (has no administration in time range)  polyethylene glycol (MIRALAX / GLYCOLAX) packet 17 g (has no administration in time range)  melatonin tablet 6 mg (has no administration in time range)  gadobutrol (GADAVIST) 1 MMOL/ML injection 5.5 mL (5.5 mLs Intravenous Contrast Given 07/15/20 1402)    ED Course  I have reviewed the triage vital signs and the nursing notes.  Pertinent labs & imaging results that were available during my care of the patient were reviewed by me and considered in my medical decision making (see chart for details).  This is a 24 year old male with a history of recurring MS presenting to emergency department with new weakness in his LLE and bilateral LE paresthesias.  He has mother concerned this may be recurring MS flare.  These episodes were noted on physical exam, although he does appear chronically deconditioned, and is  required assistance with ambulation at baseline.    I discussed the case with neurology who recommended MR imaging of the brain, C and T-spine, which have been ordered.  We will also obtain basic labs and check a UA to look for signs of infection.  We will swab him for Covid as he is unvaccinated, though he has no other signs or symptoms of COVID-19 at this time.  Patient also reported a small nodule on his left forearm, where he says they placed the IV during his last hospitalization a month ago.  This may be a superficial thrombophlebitis or else with cyst formation, very low suspicion for abscess at this time.  Clinically I have a low suspicion for deep venous thrombosis with the superficiality and location of the site.  It is completely painless.  We will monitor it for now.    Clinical Course as of Jul 16 1811  Tue Jul 15, 2020  4540 I spoke to Dr Derry Lory from neurology who  recommends MRI brain and C and T spine with and without contrast to evaluate for new lesions, then will re-discuss with them, prior to initiating steroids.   [MT]  1517 IMPRESSION: Mildly motion degraded examination.  Numerous (numbering at least 15-20) subcentimeter enhancing lesions within the juxtacortical and subcortical white matter of the bilateral cerebral hemispheres. The majority of, if not all of, these enhancing lesions appear new as compared to the MRI of 05/19/2020 and are consistent with sites of active demyelination.  As before, there is background severe multifocal T2 hyperintense signal abnormality within the cerebral white matter, corpus callosum, thalami, brainstem, cerebellum and middle cerebellar peduncles. These findings are consistent with the provided history of multiple sclerosis.   [MT]  1517 Spoke to Dr Derry Lory from neurology who recommends high dose IV steroids and medical admission.   [MT]  1546 Solumedrol ordered, will page for admission   [MT]  1627 Signed out to family medicine team   [MT]  1628 Patient and mother updated, neurologist noted at bedside   [MT]    Clinical Course User Index [MT] Sameerah Nachtigal, Kermit Balo, MD    Final Clinical Impression(s) / ED Diagnoses Final diagnoses:  Multiple sclerosis [facility])    Rx / DC Orders ED Discharge Orders    None       Zebulun Deman, Kermit Balo, MD 07/15/20 [phone]

## 2020-07-15 NOTE — H&P (Addendum)
Family Medicine Teaching [facility] Admission History and Physical Service Pager: [phone]  Patient name: Jaime [name] Medical record number: [id number] Date of birth: 11-28-1995 Age: 24 y.o. Gender: male  Primary Care Provider: Macy Mis, MD Consultants: Neurology Code Status: Full  Emergency Contact:  Regina Eck, (Mother), [phone] (cell), [phone](home)  Chief Complaint: Weakness  Assessment and Plan: Jaime [name] is a 24 y.o. male presenting with left leg weakness due to multiple sclerosis flare. PMH is significant for relapsing remitting MS, POTS, autism (high-functioning), and depression.   Relapsing Remitting Multiple Sclerosis  Patient has a history of relapsing, remitting MS diagnosed in March, 2021 and has had 2 subsequent hospitalizations for multiple sclerosis.  At baseline, he is able to ambulate independently and has a tremor in his right hand.  He is followed by Dr. Epimenio Foot, neurology and has not yet started disease modifying therapy.  Presenting with LE weakness, numbness and tingling (left greater than right), worsening right hand tremor, and slurred speech. Mom reports pt has some swallowing difficulty and urinary incontinence. Patient was to start Tysabri infusions. Exam notable for lateral nystagmus, hyperreflexia and left leg weakness, and likely some element of ataxia. Neurology consulted in the ED.  MRI brain revealed multiple new white matter lesions in the bilateral cerebral hemispheres, not seen on previous MRI in August 2021, consistent with active demyelination.  MR C-spine showed no active demyelination however MR T-spine showed evidence of active demyelination at T4 and T5. No eye involvement to suggest optic neuritis.  Patient started on steroids in the ED. Neurology recommended admission for IV steroids.  - admit to FPTS, med-tele, Dr. Manson Passey attending - Neurology consulted, appreciate recommendations - IV methylprednisolone 1000 mg x 5  days (10/12- ) - vitals per unit routine - PT/OT: eval and treat - SLP - Lovenox for VTE ppx  - Protonix   Postural orthostatic tachycardia syndrome  Home meds: midodrine 7.5 mg TID. Wears a abdominal compression girdle at home. Most recent BP 114/76. - continue home meds  GERD At home, takes omeprazole 40 mg daily. - Protonix 40 mg po  Covid vaccination Patient unvaccinated, interested in receiving the vaccine during admission.. - Consider vaccination prior to discharge  Autism Spectrum Disorder Patient has high functioning autism.   Depression  Patient prescribed Zoloft but mom reports he has not started this medication.   Insomina  Mom reports pt takes Melatonin 6 mg prn at bedtime.  - Melatonin 6 mg prn    FEN/GI: regular diet Prophylaxis: enoxaparin   Disposition: Med-Tele    History of Present Illness:  Jaime [name] is a 24 y.o. male presenting with bilateral leg weakness (left worse than right), right hand tremor, and slurred speech that started 5 days ago.  Symptoms have been gradually worsening since onset.  He has a tremor in his right hand at baseline, which was worsened over the past few days.  He has also had numbness and tingling in his legs as well.  Mother has noticed progressive worsening in his gait, described by mother as if he "had been drinking a lot of beer".  Normally, he is able to walk on his own but has been requiring assistance (such as needing help walking to the bathroom and using the stairs) over the past few days.  Prior to arrival, mother required the help of a neighbor to carry him to the car due to his leg weakness.  He also sustained a fall this past Friday (4 days  ago) where he fell onto his right side, denies head injury and is not currently in pain.  Denies headache, vision changes.  Patient was diagnosed with MS in March 2021 after what mother describes as him "flopping like a fish on the ground".  He has had 2 subsequent  hospitalizations due to MS flareup.  During the second hospitalization, he had blurry vision and LE weakness. Last time he had similar sx when he was hospitalized. The patient reports he was very mobile prior to MS diagnosis in March 2021. For example, he previously walked several miles without issue. March 2021 was sudden change. He had onset of weakness and falls. Diagnosed with MS at that time.   He follows with Dr. Epimenio Foot, neurology.  Mother states that they were in the process of getting him started on Tysabri, but has not started yet. He had not been on any disease modifying therapy.  Patient was supposed to meet his new PCP today.  No recent sick contacts.  Not vaccinated against COVID, but is interested in receiving the vaccine here.  Mom gives him Ensure bid.   Review Of Systems: Per HPI with the following additions:   Review of Systems  Constitutional: Negative for fever.  HENT: Negative for sore throat.   Eyes: Negative for blurred vision.  Respiratory: Negative for cough and sputum production.   Cardiovascular: Negative for chest pain.  Gastrointestinal: Negative for abdominal pain, nausea and vomiting.  Genitourinary: Negative for dysuria.  Musculoskeletal: Positive for falls. Negative for neck pain.  Skin: Negative for rash.  Neurological: Positive for dizziness, tingling, tremors, speech change and weakness. Negative for headaches.       Numbness    Patient Active Problem List   Diagnosis Date Noted  . Depression   . Leucocytosis   . Exacerbation of multiple sclerosis (HCC) 05/20/2020  . POTS (postural orthostatic tachycardia syndrome) 05/20/2020  . Poor balance 01/09/2020  . Autism spectrum disorder   . Tachycardia   . Dysautonomia orthostatic hypotension syndrome   . Multiple sclerosis exacerbation (HCC) 12/17/2019  . Lower extremity weakness 12/12/2019  . Demyelinating disease of central nervous system (HCC) 12/12/2019  . MS (multiple sclerosis) (HCC)  12/12/2019    Past Medical History: Past Medical History:  Diagnosis Date  . Autism   . Depression   . MS (multiple sclerosis) (HCC)     Past Surgical History: Past Surgical History:  Procedure Laterality Date  . NO PAST SURGERIES      Social History: Social History   Tobacco Use  . Smoking status: Never Smoker  . Smokeless tobacco: Never Used  Vaping Use  . Vaping Use: Never used  Substance Use Topics  . Alcohol use: No  . Drug use: Never   Additional social history: Graduated high school, mother thinking about taking him back to [employer] for program for students with special needs. Likes Tour manager.  Please also refer to relevant sections of EMR.  Family History: Family History  Problem Relation Age of Onset  . Clotting disorder Mother    Kateri Mc has multiple sclerosis.    Allergies and Medications: No Known Allergies No current facility-administered medications on file prior to encounter.   Current Outpatient Medications on File Prior to Encounter  Medication Sig Dispense Refill  . acetaminophen (TYLENOL) 325 MG tablet Take 2 tablets (650 mg total) by mouth every 4 (four) hours as needed for mild pain or fever (or Fever >/= 101).    . cephALEXin (KEFLEX) 500  MG capsule Take 1 capsule (500 mg total) by mouth every 12 (twelve) hours. 6 capsule 0  . melatonin 3 MG TABS tablet Take 2 tablets (6 mg total) by mouth at bedtime. (Patient not taking: Reported on 06/19/2020) 30 tablet 0  . midodrine (PROAMATINE) 2.5 MG tablet Take 3 tablets (7.5 mg total) by mouth 3 (three) times daily with meals. 90 tablet 0  . pantoprazole (PROTONIX) 40 MG tablet Take 1 tablet (40 mg total) by mouth daily. 30 tablet 0  . senna (SENOKOT) 8.6 MG TABS tablet Take 2 tablets (17.2 mg total) by mouth 2 (two) times daily at 10 AM and 5 PM. 120 tablet 0  . sertraline (ZOLOFT) 50 MG tablet Take 1 tablet (50 mg total) by mouth daily. 30 tablet 11    Objective: BP 114/76   Pulse (!) 59    Temp 98.2 F (36.8 C) (Oral)   Resp 14   SpO2 99%  Exam: General: Well-appearing young male, laying in bed comfortably, NAD Eyes: PERRL, EOMI ENTM: MMM, lips chapped Neck: supple Cardiovascular: RRR, no murmurs Respiratory: CTAB Gastrointestinal: soft, non-tender, +BS MSK: moves all extremities Derm: no rash seen Neuro: Alert, conversant, speech intact and easy to understand, gaze-evoked nystagmus noted, CN II-XII otherwise intact, 5/5 strength right lower extremity, 4+ strength left lower extremity, 4+/5 grip strength on left, otherwise 5/5 strength bilateral upper extremities, gross sensation intact bilateral upper and lower extremities, right hand tremor increased with finger-to-nose, normal finger-to-nose on left, heel-to-shin intact Psych: affect appropriate  Labs and Imaging: CBC BMET  Recent Labs  Lab 07/15/20 0829  WBC 9.7  HGB 14.6  HCT 44.0  PLT 232   Recent Labs  Lab 07/15/20 0829  NA 141  K 3.6  CL 105  CO2 26  BUN 17  CREATININE 0.82  GLUCOSE 88  CALCIUM 9.8     MR Brain W and Wo Contrast  Result Date: 07/15/2020 CLINICAL DATA:  Multiple sclerosis, new event; new left leg weakness. EXAM: MRI HEAD WITHOUT AND WITH CONTRAST TECHNIQUE: Multiplanar, multiecho pulse sequences of the brain and surrounding structures were obtained without and with intravenous contrast. CONTRAST:  5.30mL GADAVIST GADOBUTROL 1 MMOL/ML IV SOLN COMPARISON:  Brain MRI 05/19/2020. FINDINGS: Brain: Mild intermittent motion degradation. This includes mild motion degradation of the axial T1 weighted postcontrast sequence. There are numerous subcentimeter enhancing lesions within the juxtacortical and subcortical white matter of the bilateral cerebral hemispheres (at least 15-20 in number) (see annotations on series 32, image 34). The majority of, if not all of, these enhancing lesions appear new as compared to the prior examination of 05/19/2020. As before, there is background severe multifocal  T2/FLAIR hyperintense signal abnormality within the subcortical/juxtacortical and deep cerebral white matter, corpus callosum, thalami, brainstem, cerebellum and middle cerebellar peduncles. No evidence of intracranial mass. No chronic intracranial blood products. No extra-axial fluid collection. No midline shift. Vascular: Expected proximal arterial flow voids. Skull and upper cervical spine: No focal marrow lesion. Sinuses/Orbits: Visualized orbits show no acute finding. Trace scattered paranasal sinus mucosal thickening. No significant mastoid effusion. IMPRESSION: Mildly motion degraded examination. Numerous (numbering at least 15-20) subcentimeter enhancing lesions within the juxtacortical and subcortical white matter of the bilateral cerebral hemispheres. The majority of, if not all of, these enhancing lesions appear new as compared to the MRI of 05/19/2020 and are consistent with sites of active demyelination. As before, there is background severe multifocal T2 hyperintense signal abnormality within the cerebral white matter, corpus callosum, thalami, brainstem, cerebellum  and middle cerebellar peduncles. These findings are consistent with the provided history of multiple sclerosis. Electronically Signed   By: Jackey Loge DO   On: 07/15/2020 15:09   MR Cervical Spine W or Wo Contrast  Result Date: 07/15/2020 CLINICAL DATA:  Multiple sclerosis, new event. Additional provided: Weakness, difficulty walking. EXAM: MRI CERVICAL SPINE WITHOUT AND WITH CONTRAST TECHNIQUE: Multiplanar and multiecho pulse sequences of the cervical spine, to include the craniocervical junction and cervicothoracic junction, were obtained without and with intravenous contrast. CONTRAST:  5.1mL GADAVIST GADOBUTROL 1 MMOL/ML IV SOLN COMPARISON:  Cervical spine MRI 12/11/2019. FINDINGS: The examination is intermittently motion degraded, limiting evaluation. Most notably, there is moderate motion degradation of the axial T2 GRE  sequence, moderate motion degradation of the axial T2 weighted sequence and moderate/severe motion degradation of the postcontrast axial T1 weighted sequence. Alignment: Straightening of the expected cervical lordosis. No significant spondylolisthesis. Vertebrae: Vertebral body height is maintained. No focal suspicious osseous lesion or significant marrow edema. Cord: Similar to the prior MRI of 12/11/2019, there is extensive multifocal T2/STIR hyperintense signal abnormality throughout the majority of the cervical spinal cord. Within the limitations of motion degradation, no abnormal cord enhancement is identified to suggest active demyelination. There is associated mild diffuse atrophy of the cervical spinal cord. Posterior Fossa, vertebral arteries, paraspinal tissues: Posterior fossa better assessed on the concurrently performed brain MRI. Flow voids preserved within the imaged cervical vertebral arteries. Paraspinal soft tissues within normal limits. Disc levels: Intervertebral disc height is maintained. No significant disc herniation, spinal canal stenosis or neural foraminal narrowing at any level. IMPRESSION: Motion degraded examination as described and limiting evaluation. Similar to the prior MRI of 12/11/2019, there is extensive multifocal T2 hyperintense signal abnormality affecting the majority of the cervical spinal cord. There is associated mild diffuse cord atrophy and these findings are consistent with the provided history of multiple sclerosis. Within the limitations of motion degradation, no abnormal enhancement of the cervical spinal cord is identified to suggest active demyelination. Electronically Signed   By: Jackey Loge DO   On: 07/15/2020 15:19   MR THORACIC SPINE W WO CONTRAST  Result Date: 07/15/2020 CLINICAL DATA:  Demyelinating disease; MS flare suspected, new left leg weakness. EXAM: MRI THORACIC WITHOUT AND WITH CONTRAST TECHNIQUE: Multiplanar and multiecho pulse sequences of the  thoracic spine were obtained without and with intravenous contrast. CONTRAST:  5.45mL GADAVIST GADOBUTROL 1 MMOL/ML IV SOLN COMPARISON:  Thoracic spine MRI 12/16/2019. FINDINGS: MRI THORACIC SPINE FINDINGS Alignment:  No significant spondylolisthesis. Vertebrae: Vertebral body height is maintained. No suspicious osseous lesion or significant marrow edema. Cord: Again demonstrated is extensive multifocal T2/STIR hyperintense signal abnormality throughout the majority of the thoracic spinal cord. There are several lesions which are new or significantly progressed as compared to the prior examination of 12/16/2019 as follows. A lesion within the central cord at the T4 level (series 23, image 10). A lesion within the right dorsal lateral aspect of the cord at the T5 level (series 23, image 13). A lesion within the right aspect of the spinal cord at the T6 level (series 23, image 16). A lesion within the central cord at the T7-T8 level (series 23, image 19). A lesion within the central cord at the T11 level (series 23, image 30). Faint contrast enhancement is questioned at the sites of the T4 and T5 level lesions and this reflect active demyelination (series 26, image 11). No definite abnormal enhancement is identified elsewhere within the thoracic cord. Paraspinal and  other soft tissues: No abnormality identified within included portions of the thorax or upper abdomen. Disc levels: At T4-T5, there is unchanged tiny central disc protrusion which partially effaces the ventral thecal sac and contacts the ventral spinal cord. No significant central canal stenosis. At T6-T7, there is an unchanged small central disc protrusion which partially effaces the ventral thecal sac and contacts the ventral spinal cord. No significant spinal canal stenosis. No significant foraminal narrowing at any level. IMPRESSION: Extensive multifocal T2 hyperintense signal abnormality affecting the majority of the thoracic spinal cord. There are T2  hyperintense lesions at the T4, T5, T6, T7-T8 and T11 levels which are new or significantly progressed as compared to the MRI of 12/16/2019. These findings are consistent with the provided history of demyelinating disease/multiple sclerosis. There is questionable enhancement corresponding with the T4 and T5 level lesions and this may reflect active demyelination. Electronically Signed   By: Jackey Loge DO   On: 07/15/2020 15:38     Littie Deeds, MD 07/15/2020, 6:17 PM PGY-1, [facility] Family Medicine FPTS Intern pager: [phone], text pages welcome   FPTS Upper-Level Resident Addendum   I have independently interviewed and examined the patient. I have discussed the above with the original author and agree with their documentation. My edits for correction/addition/clarification are in blue Please see also any attending notes.   Katha Cabal, DO PGY-2,  Family Medicine 07/15/2020 7:53 PM  FPTS Service pager: [phone] (text pages welcome through [facility])

## 2020-07-15 NOTE — ED Triage Notes (Signed)
All triage charting done by [place] G., RN. Pt reregistered at 0037 07/15/2020. "Pt here from home with mom stating that the pt is having another MS flare up . Mostly with weakness and difficulty walking pt is followed by neurology."

## 2020-07-15 NOTE — Progress Notes (Addendum)
   FMTS Attending Admission Note: Terisa Starr, MD  Personal pager:  [phone] FPTS Service Pager:  [phone]   I  have seen and examined this patient, reviewed their chart. I have discussed this patient with the resident. Will sign resident note as it is available.   24 year old with history of relapsing remitting multiple sclerosis not on controller medication, autonomic dysfunction, and autism spectrum presenting with falls, ataxia, and left sided weakness likely due to flare of quite aggressive relapsing remitting multiple sclerosis.   History obtained from patient and mother. Patient lives at home with mother and siblings. Symptoms of MS began in January/February. He has had two admissions for IV steroids (March, August). Planning to start natalizumab soon. He reports no recent illnesses, stressors, or medication changes. He is not yet vaccinated against COVID but would be interested.   He reports four days of left leg weakness. He had one fall at home due to weakness. He also endorses bilateral lower extremity numbness and tingling.  He also reports urinary incontinence, which he at times has. Mother endorses mild slurring of speech. No headaches, blurry vision, difficulty with swallowing, chest pain, cough, congestion, sick contacts, diarrhea, or abdominal pain.  Reviewed medical, surgical, family history. Per notes, great uncle had MS. He likes to play video games, high school graduate, non smoker.  Vitals noted. Labs WNL, COVID negative.  MRI of thoracic spine notable for significant thoracic spine disease. MRI brain notable for multiple new lesions.  Exam notable for somewhat slowed speech.  Neuro: CN II: PERRL CN III, IV,VI: EOMI, + nystagmus on lateral gaze  CV V: Normal sensation in V1, V2, V3 CVII: Symmetric smile and brow raise CN VIII: Normal hearing CN IX,X: Symmetric palate raise  CN XI: 5/5 shoulder shrug CN XII: Symmetric tongue protrusion  UE strength is 5/5 in  left On right, has significant ataxia, tremor, and has 4/5 resisted strength  LE strength is 4/5 on left   3+ Biceps and brachioradialis BL 3+ Patellar reflexes  Ataxia on heel to shin BL, weakness noted Normal sensation in UE and LE bilaterally  No ataxia with finger to nose, normal heel to shin   1. Relapsing remitting multiple sclerosis, with flare, IV steroids per Neurology.  2. 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 Continue midodrine, uses abdominal binder.  3. Enoxaparin for VTE prophylaxis.   Will sign history and physical as available.

## 2020-07-15 NOTE — Telephone Encounter (Signed)
Pt has still not come by to sign the tysabri start form but it does appear that he went to ED for MS symptoms.

## 2020-07-16 DIAGNOSIS — I951 Orthostatic hypotension: Secondary | ICD-10-CM | POA: Diagnosis not present

## 2020-07-16 DIAGNOSIS — R739 Hyperglycemia, unspecified: Secondary | ICD-10-CM | POA: Diagnosis not present

## 2020-07-16 DIAGNOSIS — G35 Multiple sclerosis: Principal | ICD-10-CM

## 2020-07-16 DIAGNOSIS — F840 Autistic disorder: Secondary | ICD-10-CM | POA: Diagnosis not present

## 2020-07-16 DIAGNOSIS — T380X5A Adverse effect of glucocorticoids and synthetic analogues, initial encounter: Secondary | ICD-10-CM

## 2020-07-16 LAB — CBC
HCT: 45.2 % (ref 39.0–52.0)
Hemoglobin: 15.5 g/dL (ref 13.0–17.0)
MCH: 31 pg (ref 26.0–34.0)
MCHC: 34.3 g/dL (ref 30.0–36.0)
MCV: 90.4 fL (ref 80.0–100.0)
Platelets: 258 10*3/uL (ref 150–400)
RBC: 5 MIL/uL (ref 4.22–5.81)
RDW: 13.4 % (ref 11.5–15.5)
WBC: 10.4 10*3/uL (ref 4.0–10.5)
nRBC: 0 % (ref 0.0–0.2)

## 2020-07-16 LAB — BASIC METABOLIC PANEL
Anion gap: 12 (ref 5–15)
BUN: 15 mg/dL (ref 6–20)
CO2: 22 mmol/L (ref 22–32)
Calcium: 10.1 mg/dL (ref 8.9–10.3)
Chloride: 105 mmol/L (ref 98–111)
Creatinine, Ser: 0.88 mg/dL (ref 0.61–1.24)
GFR, Estimated: >60 mL/min (ref >60)
Glucose, Bld: 141 mg/dL — ABNORMAL HIGH (ref 70–99)
Potassium: 4 mmol/L (ref 3.5–5.1)
Sodium: 139 mmol/L (ref 135–145)

## 2020-07-16 NOTE — Progress Notes (Signed)
Inpatient Rehab Admissions Coordinator:   CIR following, but cannot admit until course of IV steroids are complete.   Megan Salon, MS, CCC-SLP Rehab Admissions Coordinator  [phone] (celll) [phone] (office)

## 2020-07-16 NOTE — Progress Notes (Addendum)
Family Medicine Teaching Service Daily Progress Note Intern Pager: [phone]  Patient name: Jaime [name] Medical record number: [id number] Date of birth: 03/10/1996 Age: 24 y.o. Gender: male  Primary Care Provider: Macy Mis, MD Consultants: Neurology Code Status: Full  Pt Overview and Major Events to Date:  Patient admitted on 10/12  Assessment and Plan:  Patient is a 24 y.o. male presenting with LLE weakness d/t MS flare. PMH is significant for relapsing remitting MS, POTS, high-functioning autism, and depression.  Relapsing Remitting Multiple Sclerosis Patient with 2 previous hospitalizations d/t MS flares in March and August of 2021. Follows neurology with Dr. Epimenio Foot. Plans to start Tysabri soon. MRI brain demonstrated multiple new white matter lesions in bilateral cerebral hemispheres that were not seen on previous MRI brain in 8/21. Findings consistent with active demyelination. MR T spine demonstrated active demyelination at T4-T5. Patient started on steroids in the ED. Neurology recommended to admit patient and continue IV steroids. Patient bradycardic with a HR low of 51. Patient with no additional concerns this morning. Labs notable for glucose of 141, but otherwise unremarkable. - Continue IV methylprednisolone 1000 mg 58 mL/hr x5 days - PT/OT consult - SLP consult - Bladder scan q8h - will update mother about his status  Postural orthostatic tachycardia syndrome Home medications include midrodrine 7.5 mg TID. Most recent BP this morning 97/62.  - Continue home midrodrine 7.5 mg TID  GERD Home medications include omeprazole 40 mg qd. - Start Protonix 40 mg qd  Depression Patient has been prescribed Zoloft, but has not started taking this medication yet.  Autism Spectrum Disorder Patient has high functioning autism.  Covid vaccination Patient has not received Covid vaccination and is interested in receiving one if possible.  - Consider vaccination prior to  discharge  FEN/GI: Regular PPx: Lovenox  Disposition: Med-Tele  Subjective:  No acute overnight events, patient was evaluated at the bedside this morning. He reports no issues or concerns. He denies any complaints. He says that he slept well, but continues to have some mild weakness in both of his legs, worse on the left than the right.  Objective: Temp:  [97.7 F (36.5 C)-98.7 F (37.1 C)] 97.7 F (36.5 C) (10/13 0552) Pulse Rate:  [51-73] 52 (10/13 0552) Resp:  [14-18] 18 (10/13 0552) BP: (97-114)/(62-80) 97/62 (10/13 0552) SpO2:  [97 %-100 %] 97 % (10/13 0552) Weight:  [53.6 kg] 53.6 kg (10/12 2013) Physical Exam: General: Patient is a pleasant, young male sitting up in bed, eating breakfast, in no apparent distress. Cardiovascular: Normal S1 and S2 with no murmurs, rubs, or gallops. RRR. Distal pulses 2+ and symmetrical. Respiratory: CTA in all fields with no wheezing, rales, or rhonchi. Chest rise is symmetrical with no increased labor of breathing. Abdominal: Soft, non distended, non tender to palpation. No hepatosplenomegaly appreciated. Bowel sounds normoactive. Skin: No active rashes or lesions on exam. Extremities: No peripheral edema or cyanosis.  Neurological: Alert and oriented x4. Normal mentation. Horizontal nystagmus noted. CN II-XII otherwise intact. MS 5/5 in RUE and RLE. MS 4/5 in LLE and LUE. Tremor present in RUE. Tremor worsens with finger to nose on the right. Finger to nose intact on the left. Heel to shin intact bilaterally. Sensation to fine touch intact bilaterally.  Laboratory: Recent Labs  Lab 07/15/20 0829  WBC 9.7  HGB 14.6  HCT 44.0  PLT 232   Recent Labs  Lab 07/15/20 0829  NA 141  K 3.6  CL 105  CO2 26  BUN 17  CREATININE 0.82  CALCIUM 9.8  GLUCOSE 88    Imaging/Diagnostic Tests: MR Brain W and Wo Contrast  Result Date: 07/15/2020 CLINICAL DATA:  Multiple sclerosis, new event; new left leg weakness. EXAM: MRI HEAD WITHOUT  AND WITH CONTRAST TECHNIQUE: Multiplanar, multiecho pulse sequences of the brain and surrounding structures were obtained without and with intravenous contrast. CONTRAST:  5.15mL GADAVIST GADOBUTROL 1 MMOL/ML IV SOLN COMPARISON:  Brain MRI 05/19/2020. FINDINGS: Brain: Mild intermittent motion degradation. This includes mild motion degradation of the axial T1 weighted postcontrast sequence. There are numerous subcentimeter enhancing lesions within the juxtacortical and subcortical white matter of the bilateral cerebral hemispheres (at least 15-20 in number) (see annotations on series 32, image 34). The majority of, if not all of, these enhancing lesions appear new as compared to the prior examination of 05/19/2020. As before, there is background severe multifocal T2/FLAIR hyperintense signal abnormality within the subcortical/juxtacortical and deep cerebral white matter, corpus callosum, thalami, brainstem, cerebellum and middle cerebellar peduncles. No evidence of intracranial mass. No chronic intracranial blood products. No extra-axial fluid collection. No midline shift. Vascular: Expected proximal arterial flow voids. Skull and upper cervical spine: No focal marrow lesion. Sinuses/Orbits: Visualized orbits show no acute finding. Trace scattered paranasal sinus mucosal thickening. No significant mastoid effusion. IMPRESSION: Mildly motion degraded examination. Numerous (numbering at least 15-20) subcentimeter enhancing lesions within the juxtacortical and subcortical white matter of the bilateral cerebral hemispheres. The majority of, if not all of, these enhancing lesions appear new as compared to the MRI of 05/19/2020 and are consistent with sites of active demyelination. As before, there is background severe multifocal T2 hyperintense signal abnormality within the cerebral white matter, corpus callosum, thalami, brainstem, cerebellum and middle cerebellar peduncles. These findings are consistent with the provided  history of multiple sclerosis. Electronically Signed   By: Jackey Loge DO   On: 07/15/2020 15:09   MR Cervical Spine W or Wo Contrast  Result Date: 07/15/2020 CLINICAL DATA:  Multiple sclerosis, new event. Additional provided: Weakness, difficulty walking. EXAM: MRI CERVICAL SPINE WITHOUT AND WITH CONTRAST TECHNIQUE: Multiplanar and multiecho pulse sequences of the cervical spine, to include the craniocervical junction and cervicothoracic junction, were obtained without and with intravenous contrast. CONTRAST:  5.70mL GADAVIST GADOBUTROL 1 MMOL/ML IV SOLN COMPARISON:  Cervical spine MRI 12/11/2019. FINDINGS: The examination is intermittently motion degraded, limiting evaluation. Most notably, there is moderate motion degradation of the axial T2 GRE sequence, moderate motion degradation of the axial T2 weighted sequence and moderate/severe motion degradation of the postcontrast axial T1 weighted sequence. Alignment: Straightening of the expected cervical lordosis. No significant spondylolisthesis. Vertebrae: Vertebral body height is maintained. No focal suspicious osseous lesion or significant marrow edema. Cord: Similar to the prior MRI of 12/11/2019, there is extensive multifocal T2/STIR hyperintense signal abnormality throughout the majority of the cervical spinal cord. Within the limitations of motion degradation, no abnormal cord enhancement is identified to suggest active demyelination. There is associated mild diffuse atrophy of the cervical spinal cord. Posterior Fossa, vertebral arteries, paraspinal tissues: Posterior fossa better assessed on the concurrently performed brain MRI. Flow voids preserved within the imaged cervical vertebral arteries. Paraspinal soft tissues within normal limits. Disc levels: Intervertebral disc height is maintained. No significant disc herniation, spinal canal stenosis or neural foraminal narrowing at any level. IMPRESSION: Motion degraded examination as described and  limiting evaluation. Similar to the prior MRI of 12/11/2019, there is extensive multifocal T2 hyperintense signal abnormality affecting the majority of the cervical spinal  cord. There is associated mild diffuse cord atrophy and these findings are consistent with the provided history of multiple sclerosis. Within the limitations of motion degradation, no abnormal enhancement of the cervical spinal cord is identified to suggest active demyelination. Electronically Signed   By: Jackey Loge DO   On: 07/15/2020 15:19   MR THORACIC SPINE W WO CONTRAST  Result Date: 07/15/2020 CLINICAL DATA:  Demyelinating disease; MS flare suspected, new left leg weakness. EXAM: MRI THORACIC WITHOUT AND WITH CONTRAST TECHNIQUE: Multiplanar and multiecho pulse sequences of the thoracic spine were obtained without and with intravenous contrast. CONTRAST:  5.63mL GADAVIST GADOBUTROL 1 MMOL/ML IV SOLN COMPARISON:  Thoracic spine MRI 12/16/2019. FINDINGS: MRI THORACIC SPINE FINDINGS Alignment:  No significant spondylolisthesis. Vertebrae: Vertebral body height is maintained. No suspicious osseous lesion or significant marrow edema. Cord: Again demonstrated is extensive multifocal T2/STIR hyperintense signal abnormality throughout the majority of the thoracic spinal cord. There are several lesions which are new or significantly progressed as compared to the prior examination of 12/16/2019 as follows. A lesion within the central cord at the T4 level (series 23, image 10). A lesion within the right dorsal lateral aspect of the cord at the T5 level (series 23, image 13). A lesion within the right aspect of the spinal cord at the T6 level (series 23, image 16). A lesion within the central cord at the T7-T8 level (series 23, image 19). A lesion within the central cord at the T11 level (series 23, image 30). Faint contrast enhancement is questioned at the sites of the T4 and T5 level lesions and this reflect active demyelination (series 26,  image 11). No definite abnormal enhancement is identified elsewhere within the thoracic cord. Paraspinal and other soft tissues: No abnormality identified within included portions of the thorax or upper abdomen. Disc levels: At T4-T5, there is unchanged tiny central disc protrusion which partially effaces the ventral thecal sac and contacts the ventral spinal cord. No significant central canal stenosis. At T6-T7, there is an unchanged small central disc protrusion which partially effaces the ventral thecal sac and contacts the ventral spinal cord. No significant spinal canal stenosis. No significant foraminal narrowing at any level. IMPRESSION: Extensive multifocal T2 hyperintense signal abnormality affecting the majority of the thoracic spinal cord. There are T2 hyperintense lesions at the T4, T5, T6, T7-T8 and T11 levels which are new or significantly progressed as compared to the MRI of 12/16/2019. These findings are consistent with the provided history of demyelinating disease/multiple sclerosis. There is questionable enhancement corresponding with the T4 and T5 level lesions and this may reflect active demyelination. Electronically Signed   By: Jackey Loge DO   On: 07/15/2020 15:38     Loren Racer, Medical Student 07/16/2020, 7:40 AM FPTS Intern pager: [phone], text pages welcome  Resident Addendum I have separately seen and examined the patient.  I have discussed the findings and exam with the student and agree with the above note.  I helped develop the management plan that is described in the student's note and I agree with the content.     I spoke with patient and he is agreeable to CIR.  Will expect him to transfer to their service in the next few days.    Lenor Coffin, MD PGY-3 [facility] residency program

## 2020-07-16 NOTE — Progress Notes (Signed)
Inpatient Rehab Admissions Coordinator:   Met with patient at bedside to discuss potential CIR admission. Pt. Stated interest. Will pursue for potential admit next week, pending bed availability.  Nelton Amsden, MS, CCC-SLP Rehab Admissions Coordinator  [phone] (celll) [phone] (office) 

## 2020-07-16 NOTE — Progress Notes (Signed)
NEUROLOGY CONSULTATION PROGRESS NOTE   Date of service: July 16, 2020 Patient Name: Jaime [name] MRN:  [id number] DOB:  1996/07/02  Brief HPI  Jaime [name] is a 24 y.o. male with aggressive relapsing remitting MS, p/w L > R BL leg weakness and ataxia. Found to have numerous enhancing lesions on MRI B and a ? Enhancement in T spine. Findings concerning for MS flare up.   Interval Hx   No significant difference compared to yesterday. Eating his breakfast, no complaints.  Vitals   Vitals:   07/15/20 0900 07/15/20 1200 07/15/20 2013 07/16/20 0552  BP: 109/68 114/76 114/80 97/62  Pulse: 61 (!) 59 (!) 51 (!) 52  Resp:   18 18  Temp:   98.7 F (37.1 C) 97.7 F (36.5 C)  TempSrc:   Oral Oral  SpO2: 100% 99% 99% 97%  Weight:   53.6 kg      Body mass index is 17.45 kg/m.  Physical Exam   General: Laying comfortably in bed; in no acute distress. HENT: Normal oropharynx and mucosa. Normal external appearance of ears and nose. Neck: Supple, no pain or tenderness CV: No JVD. No peripheral edema. Pulmonary: Symmetric Chest rise. Normal respiratory effort. Abdomen: Soft to touch, non-tender. Ext: No cyanosis, edema, or deformity Skin: No rash. Normal palpation of skin.  Musculoskeletal: Normal digits and nails by inspection. No clubbing.  Neurologic Examination  Mental status/Cognition: Alert, oriented to self, able to carry on a conversation. Speech/language: Fluent, comprehension intact. Cranial nerves:   CN II Pupils equal and round, no VF deficits   CN III,IV,VI EOM intact, no gaze preference or deviation, still has gaze evoked nystagmus with horizontal gaze that is direction changing.   CN V    CN VII no asymmetry, no nasolabial fold flattening   CN VIII normal hearing to speech   CN IX & X normal palatal elevation, no uvular deviation   CN XI    CN XII midline tongue protrusion   Motor:  Muscle bulk: normal, tone increased, pronator drift none. Intention  tremor with ataxia. Mvmt Root Nerve  Muscle Right Left Comments  SA C5/6 Ax Deltoid     EF C5/6 Mc Biceps     EE C6/7/8 Rad Triceps     WF C6/7 Med FCR     WE C7/8 PIN ECU     F Ab C8/T1 U ADM/FDI 5 5   HF L1/2/3 Fem Illopsoas     KE L2/3/4 Fem Quad     DF L4/5 D Peron Tib Ant 4+ 3   PF S1/2 Tibial Grc/Sol 4+ 4    Reflexes:  Right Left Comments  Pectoralis + +    Biceps (C5/6) 3 3   Brachioradialis (C5/6) 3 3    Triceps (C6/7) 3 3    Patellar (L3/4) 3 3    Achilles (S1) 4 4    Hoffman + +    Plantar up up   Jaw jerk    Sensation:  Light touch intact   Pin prick    Temperature    Vibration   Proprioception    Coordination/Complex Motor:  - Finger to Nose with ataxia and intention tremor. - Rapid alternating movement are slowed.  Labs   Basic Metabolic Panel:  Lab Results  Component Value Date   NA 139 07/16/2020   K 4.0 07/16/2020   CO2 22 07/16/2020   GLUCOSE 141 (H) 07/16/2020   BUN 15 07/16/2020   CREATININE 0.88 07/16/2020  CALCIUM 10.1 07/16/2020   GFRNONAA >60 07/16/2020   GFRAA >60 06/02/2020   HbA1c:  Lab Results  Component Value Date   HGBA1C 5.3 12/14/2019   LDL: No results found for: [facility] Urine Drug Screen: No results found for: LABOPIA, COCAINSCRNUR, LABBENZ, AMPHETMU, THCU, LABBARB  Alcohol Level No results found for: ETH No results found for: PHENYTOIN, ZONISAMIDE, LAMOTRIGINE, LEVETIRACETA No results found for: PHENYTOIN, PHENOBARB, VALPROATE, CBMZ  Imaging and Diagnostic studies   Reviewed labs from today, some mild glucose elevation probably 2/2 steroids. No significant abnormality on CBC or chemistry.  Impression   with aggressive relapsing remitting MS, p/w L > R BL leg weakness and ataxia. Found to have numerous enhancing lesions on MRI B and a ? Enhancement in T spine. Findings concerning for MS flare up.  Recommendations   - IV methylprednisone 1000mg  Q24 hours x 5 days with Protonix. May consider a short Prednisone  taper afterward depending on how he responds. - Yet to be evaluated by Pt and OT. - Could not get in touch with mom over the phone to update her. ______________________________________________________________________   Thank you for the opportunity to take part in the care of this patient. If you have any further questions, please contact the neurology consultation attending.  Signed,  [facility] Pager Number Erick Blinks

## 2020-07-16 NOTE — Progress Notes (Signed)
Occupational Therapy Evaluation  Patient with functional deficits listed below impacting safety and independence with self care. Patient with tremulous extremities, impaired dynamic sitting balance requiring up to mod A for LB dressing task. Patient able to doff sock however unable to don. Patient mod A to power up to standing and stabilize, and ambulate to bathroom for toilet transfer. Patient max A for clothing management due to impaired standing balance. Recommend continued acute OT services to facilitate D/C to venue listed below, patient is motivated to participate in therapy and regain maximize independence with self care.    07/16/20 1200  OT Visit Information  Last OT Received On 07/16/20  Assistance Needed +1  History of Present Illness 24 y.o. male presenting with left leg weakness due to multiple sclerosis flare. PMH is significant for relapsing remitting MS, POTS, autism (high-functioning), and depression.  Precautions  Precautions Fall  Restrictions  Weight Bearing Restrictions No  Home Living  Family/patient expects to be discharged to: Private residence  Living Arrangements Parent;Other relatives  Available Help at Discharge Family;Available 24 hours/day  Type of Home Apartment  Home Access Stairs to enter  Entrance Stairs-Number of Steps 2 without rail at back entrance, 4 wiht bil rail at front entrance, per pt  Entrance Stairs-Rails Can reach both  Home Layout Two level;Bed/bath upstairs  Alternate Level Stairs-Number of Steps full flight  Alternate Level Stairs-Rails Right  Bathroom Shower/Tub Tub/shower unit  Horticulturist, commercial Yes  Home Equipment Walker - 2 wheels  Prior Function  Level of Independence Needs assistance  Gait / Transfers Assistance Needed pt reports he has been ambulating with use of a walker recently. Fell on 10/8 attempting to get to the bathroom. Pt's mother has been assisting with stairs and ADLs  Communication   Communication No difficulties  Pain Assessment  Pain Assessment No/denies pain  Cognition  Arousal/Alertness Awake/alert  Behavior During Therapy Flat affect  Overall Cognitive Status History of cognitive impairments - at baseline  General Comments slowed processing at times, pt with history of high functioning autism. Pt has good awareness of situation and follows commands well.  Upper Extremity Assessment  Upper Extremity Assessment RUE deficits/detail;LUE deficits/detail  RUE Deficits / Details grossly 4-/5. bilateral UE tremous  RUE Sensation WNL  RUE Coordination decreased gross motor;decreased fine motor  LUE Deficits / Details grossly 4-/5  LUE Sensation WNL  LUE Coordination decreased fine motor;decreased gross motor  Lower Extremity Assessment  Lower Extremity Assessment Defer to PT evaluation  ADL  Overall ADL's  Needs assistance/impaired  Grooming Minimal assistance;Sitting  Upper Body Bathing Minimal assistance;Sitting  Lower Body Bathing Moderate assistance;Sit to/from stand;Sitting/lateral leans  Upper Body Dressing  Minimal assistance;Sitting  Lower Body Dressing Moderate assistance;Sitting/lateral leans  Lower Body Dressing Details (indicate cue type and reason) patient able to doff sock with min A for sitting balance and unable to don   Toilet Transfer Moderate assistance;Ambulation;Cueing for safety;Cueing for sequencing;Grab bars;RW (bedside commode over toilet )  Toilet Transfer Details (indicate cue type and reason) mod A to power up to standing and stabilize, cues to reach back for commode arms with sitting for eccentric control   Toileting- Clothing Manipulation and Hygiene Maximal assistance;Sit to/from stand  Toileting - Clothing Manipulation Details (indicate cue type and reason) to manage underwear due to decreased standing balance  Functional mobility during ADLs Moderate assistance;Cueing for safety;Cueing for sequencing;Rolling walker  General ADL  Comments patient requiring increased assistance for self care due to decreased balance, activity  tolerance, safety awareness  Bed Mobility  Overal bed mobility Needs Assistance  Bed Mobility Supine to Sit;Sit to Supine  Supine to sit HOB elevated;Supervision  Sit to supine Min assist  General bed mobility comments min A to position trunk back into bed  Transfers  Overall transfer level Needs assistance  Equipment used Rolling walker (2 wheeled)  Transfers Sit to/from Stand  Sit to Stand Mod assist  General transfer comment mod A to power up to standing and stabilize, cues for hand placement and problem solve getting walker around obstacle in bathroom  Balance  Overall balance assessment Needs assistance  Sitting-balance support No upper extremity supported;Feet supported  Sitting balance-Leahy Scale Fair  Sitting balance - Comments tremulous, up to mod A for dynamic standing balance  Standing balance support Bilateral upper extremity supported  Standing balance-Leahy Scale Poor  Standing balance comment reliant on B UE support and mod A  OT - End of Session  Equipment Utilized During Treatment Rolling walker  Activity Tolerance Patient tolerated treatment well  Patient left in bed;with call bell/phone within reach;with bed alarm set  Nurse Communication Mobility status;Other (comment) (patient concerned about constipation)  OT Assessment  OT Recommendation/Assessment Patient needs continued OT Services  OT Visit Diagnosis Unsteadiness on feet (R26.81);Other abnormalities of gait and mobility (R26.89);History of falling (Z91.81);Other symptoms and signs involving the nervous system (R29.898)  OT Problem List Decreased activity tolerance;Impaired balance (sitting and/or standing);Decreased coordination;Decreased safety awareness;Decreased knowledge of use of DME or AE  OT Plan  OT Frequency (ACUTE ONLY) Min 2X/week  OT Treatment/Interventions (ACUTE ONLY) Self-care/ADL  training;Therapeutic exercise;Neuromuscular education;DME and/or AE instruction;Therapeutic activities;Patient/family education;Balance training  AM-PAC OT "6 Clicks" Daily Activity Outcome Measure (Version 2)  Help from another person eating meals? 3  Help from another person taking care of personal grooming? 3  Help from another person toileting, which includes using toliet, bedpan, or urinal? 2  Help from another person bathing (including washing, rinsing, drying)? 2  Help from another person to put on and taking off regular upper body clothing? 3  Help from another person to put on and taking off regular lower body clothing? 2  6 Click Score 15  OT Recommendation  Follow Up Recommendations CIR  OT Equipment Other (comment) (defer to next venue)  Individuals Consulted  Consulted and Agree with Results and Recommendations Patient  Acute Rehab OT Goals  Patient Stated Goal agreeable to CIR  OT Goal Formulation With patient  Time For Goal Achievement 07/30/20  Potential to Achieve Goals Good  OT Time Calculation  OT Start Time (ACUTE ONLY) 1057  OT Stop Time (ACUTE ONLY) 1129  OT Time Calculation (min) 32 min  OT General Charges  $OT Visit 1 Visit  OT Evaluation  $OT Eval Moderate Complexity 1 Mod  OT Treatments  $Self Care/Home Management  8-22 mins  Written Expression  Dominant Hand Right   Marlyce Huge OT OT pager: [phone]

## 2020-07-16 NOTE — Progress Notes (Addendum)
Inpatient Rehab Admissions Coordinator Note:   Per therapy recommendations, pt was screened for CIR candidacy by Brysen Shankman, MS CCC-SLP. At this time, Pt. Appears to have functional decline and is a good candidate for CIR. Will request order for rehab consult per protocol.  Please contact me with questions.   Zavier Canela, MS, CCC-SLP Rehab Admissions Coordinator  [phone] (celll) [phone] (office)  

## 2020-07-16 NOTE — Consult Note (Signed)
Physical Medicine and Rehabilitation Consult Reason for Consult: Decreased functional mobility Referring Physician: Internal medicine   HPI: Jaime [name] is a 24 y.o. right-handed male with history of mild autism, hypotension and recently diagnosed multiple sclerosis March 2021 followed by Dr. Epimenio Foot.  History taken from chart review and patient.  Patient well-known to rehab services from multiple exacerbations of MS 12/17/2019 to 12/26/2019 as well as 05/24/2020 to 06/05/2020.  He was discharged to home ambulating contact-guard assist.  Patient lives with his mother and 57 year old and 77 year old siblings.  Two-level home bed and bath upstairs.  He presented on 07/15/2020 with relapsing multiple sclerosis weakness left>right.  MRI the brain showed numerous subcentimeter enhancing lesions within the juxtacortical subcortical white matter of the bilateral cerebral hemispheres.  Currently maintained on Lovenox for DVT prophylaxis.  Intravenous Solu-Medrol initiated x5 doses.  ProAmatine for monitoring of associated orthostasis.  Tolerating regular diet.  Therapy evaluations completed with recommendations of physical medicine rehab consult.  Review of Systems  Constitutional: Negative for chills and fever.  HENT: Negative for hearing loss.   Eyes: Positive for blurred vision. Negative for double vision.  Respiratory: Negative for cough and shortness of breath.   Cardiovascular: Positive for leg swelling. Negative for chest pain.  Gastrointestinal: Positive for constipation. Negative for heartburn, nausea and vomiting.  Genitourinary: Negative for dysuria and hematuria.  Musculoskeletal: Positive for myalgias.  Skin: Negative for rash.  Neurological: Positive for sensory change, speech change and weakness.  Psychiatric/Behavioral: Positive for depression.       Autism  All other systems reviewed and are negative.  Past Medical History:  Diagnosis Date  . Autism   . Depression   . MS  (multiple sclerosis) (HCC)    Past Surgical History:  Procedure Laterality Date  . NO PAST SURGERIES     Family History  Problem Relation Age of Onset  . Clotting disorder Mother    Social History:  reports that he has never smoked. He has never used smokeless tobacco. He reports that he does not drink alcohol and does not use drugs. Allergies: No Known Allergies Medications Prior to Admission  Medication Sig Dispense Refill  . midodrine (PROAMATINE) 2.5 MG tablet Take 3 tablets (7.5 mg total) by mouth 3 (three) times daily with meals. 90 tablet 0  . pantoprazole (PROTONIX) 40 MG tablet Take 1 tablet (40 mg total) by mouth daily. 30 tablet 0  . acetaminophen (TYLENOL) 325 MG tablet Take 2 tablets (650 mg total) by mouth every 4 (four) hours as needed for mild pain or fever (or Fever >/= 101). (Patient not taking: Reported on 07/15/2020)    . melatonin 3 MG TABS tablet Take 2 tablets (6 mg total) by mouth at bedtime. (Patient not taking: Reported on 06/19/2020) 30 tablet 0  . senna (SENOKOT) 8.6 MG TABS tablet Take 2 tablets (17.2 mg total) by mouth 2 (two) times daily at 10 AM and 5 PM. (Patient not taking: Reported on 07/15/2020) 120 tablet 0  . sertraline (ZOLOFT) 50 MG tablet Take 1 tablet (50 mg total) by mouth daily. (Patient not taking: Reported on 07/15/2020) 30 tablet 11    Home: Home Living Family/patient expects to be discharged to:: Private residence Living Arrangements: Parent, Other relatives Available Help at Discharge: Family, Available 24 hours/day Type of Home: Apartment Home Access: Stairs to enter [employer] of Steps: 2 without rail at back entrance, 4 wiht bil rail at front entrance, per pt Entrance Stairs-Rails: Can reach  both Home Layout: Two level, Bed/bath upstairs Alternate Level Stairs-Number of Steps: full flight Alternate Level Stairs-Rails: Right Bathroom Shower/Tub: Engineer, manufacturing systems: Standard Bathroom Accessibility: Yes Home  Equipment: Walker - 2 wheels  Functional History: Prior Function Level of Independence: Needs assistance Gait / Transfers Assistance Needed: pt reports he has been ambulating with use of a walker recently. Fell on 10/8 attempting to get to the bathroom. Pt's mother has been assisting with stairs and ADLs Functional Status:  Mobility: Bed Mobility Overal bed mobility: Needs Assistance Bed Mobility: Supine to Sit, Sit to Supine Supine to sit: Supervision Sit to supine: Supervision Transfers Overall transfer level: Needs assistance Equipment used: Rolling walker (2 wheeled) Transfers: Sit to/from Stand Sit to Stand: Min assist Ambulation/Gait Ambulation/Gait assistance: Min Chemical engineer (Feet): 10 Feet Assistive device: Rolling walker (2 wheeled) Gait Pattern/deviations: Step-to pattern General Gait Details: pt with short step to gait, tremors noted during ambulation Gait velocity: reduced Gait velocity interpretation: <1.31 ft/sec, indicative of household ambulator    ADL:    Cognition: Cognition Overall Cognitive Status: History of cognitive impairments - at baseline Orientation Level: Oriented X4 Cognition Arousal/Alertness: Awake/alert Behavior During Therapy: Flat affect Overall Cognitive Status: History of cognitive impairments - at baseline General Comments: slowed processing at times, pt with history of high functioning autism. Pt has good awareness of situation and follows commands well.  Blood pressure 97/62, pulse (!) 52, temperature 97.7 F (36.5 C), temperature source Oral, resp. rate 18, weight 53.6 kg, SpO2 97 %. Physical Exam Vitals reviewed.  Constitutional:      Comments: Thin  HENT:     Head: Normocephalic and atraumatic.     Nose: Nose normal.  Eyes:     General:        Right eye: No discharge.        Left eye: No discharge.     Extraocular Movements: Extraocular movements intact.  Cardiovascular:     Rate and Rhythm: Normal rate and  regular rhythm.  Pulmonary:     Effort: Pulmonary effort is normal. No respiratory distress.     Breath sounds: Normal breath sounds. No stridor.  Abdominal:     General: Abdomen is flat. Bowel sounds are normal. There is no distension.  Musculoskeletal:     Cervical back: Normal range of motion and neck supple.     Comments: No edema or tenderness in extremities  Skin:    General: Skin is warm and dry.  Neurological:     Mental Status: He is alert.     Comments: Alert He does provide his name and age.   Follows commands. Motor: Grossly 5/5 throughout Apraxic Ataxic Sensation diminished to light touch bilateral lower extremities  Psychiatric:        Mood and Affect: Affect is blunt.        Speech: Speech is delayed.     Results for orders placed or performed during the hospital encounter of 07/15/20 (from the past 24 hour(s))  Urinalysis, Routine w reflex microscopic Urine, Clean Catch     Status: None   Collection Time: 07/15/20 12:16 PM  Result Value Ref Range   Color, Urine YELLOW YELLOW   APPearance CLEAR CLEAR   Specific Gravity, Urine 1.019 1.005 - 1.030   pH 6.0 5.0 - 8.0   Glucose, UA NEGATIVE NEGATIVE mg/dL   Hgb urine dipstick NEGATIVE NEGATIVE   Bilirubin Urine NEGATIVE NEGATIVE   Ketones, ur NEGATIVE NEGATIVE mg/dL   Protein, ur NEGATIVE NEGATIVE  mg/dL   Nitrite NEGATIVE NEGATIVE   Leukocytes,Ua NEGATIVE NEGATIVE  Basic metabolic panel     Status: Abnormal   Collection Time: 07/16/20  7:33 AM  Result Value Ref Range   Sodium 139 135 - 145 mmol/L   Potassium 4.0 3.5 - 5.1 mmol/L   Chloride 105 98 - 111 mmol/L   CO2 22 22 - 32 mmol/L   Glucose, Bld 141 (H) 70 - 99 mg/dL   BUN 15 6 - 20 mg/dL   Creatinine, Ser 4.19 0.61 - 1.24 mg/dL   Calcium 62.2 8.9 - 29.7 mg/dL   GFR, Estimated >98 >92 mL/min   Anion gap 12 5 - 15  CBC     Status: None   Collection Time: 07/16/20  7:33 AM  Result Value Ref Range   WBC 10.4 4.0 - 10.5 K/uL   RBC 5.00 4.22 - 5.81  MIL/uL   Hemoglobin 15.5 13.0 - 17.0 g/dL   HCT 11.9 39 - 52 %   MCV 90.4 80.0 - 100.0 fL   MCH 31.0 26.0 - 34.0 pg   MCHC 34.3 30.0 - 36.0 g/dL   RDW 41.7 40.8 - 14.4 %   Platelets 258 150 - 400 K/uL   nRBC 0.0 0.0 - 0.2 %   MR Brain W and Wo Contrast  Result Date: 07/15/2020 CLINICAL DATA:  Multiple sclerosis, new event; new left leg weakness. EXAM: MRI HEAD WITHOUT AND WITH CONTRAST TECHNIQUE: Multiplanar, multiecho pulse sequences of the brain and surrounding structures were obtained without and with intravenous contrast. CONTRAST:  5.45mL GADAVIST GADOBUTROL 1 MMOL/ML IV SOLN COMPARISON:  Brain MRI 05/19/2020. FINDINGS: Brain: Mild intermittent motion degradation. This includes mild motion degradation of the axial T1 weighted postcontrast sequence. There are numerous subcentimeter enhancing lesions within the juxtacortical and subcortical white matter of the bilateral cerebral hemispheres (at least 15-20 in number) (see annotations on series 32, image 34). The majority of, if not all of, these enhancing lesions appear new as compared to the prior examination of 05/19/2020. As before, there is background severe multifocal T2/FLAIR hyperintense signal abnormality within the subcortical/juxtacortical and deep cerebral white matter, corpus callosum, thalami, brainstem, cerebellum and middle cerebellar peduncles. No evidence of intracranial mass. No chronic intracranial blood products. No extra-axial fluid collection. No midline shift. Vascular: Expected proximal arterial flow voids. Skull and upper cervical spine: No focal marrow lesion. Sinuses/Orbits: Visualized orbits show no acute finding. Trace scattered paranasal sinus mucosal thickening. No significant mastoid effusion. IMPRESSION: Mildly motion degraded examination. Numerous (numbering at least 15-20) subcentimeter enhancing lesions within the juxtacortical and subcortical white matter of the bilateral cerebral hemispheres. The majority of, if  not all of, these enhancing lesions appear new as compared to the MRI of 05/19/2020 and are consistent with sites of active demyelination. As before, there is background severe multifocal T2 hyperintense signal abnormality within the cerebral white matter, corpus callosum, thalami, brainstem, cerebellum and middle cerebellar peduncles. These findings are consistent with the provided history of multiple sclerosis. Electronically Signed   By: Jackey Loge DO   On: 07/15/2020 15:09   MR Cervical Spine W or Wo Contrast  Result Date: 07/15/2020 CLINICAL DATA:  Multiple sclerosis, new event. Additional provided: Weakness, difficulty walking. EXAM: MRI CERVICAL SPINE WITHOUT AND WITH CONTRAST TECHNIQUE: Multiplanar and multiecho pulse sequences of the cervical spine, to include the craniocervical junction and cervicothoracic junction, were obtained without and with intravenous contrast. CONTRAST:  5.56mL GADAVIST GADOBUTROL 1 MMOL/ML IV SOLN COMPARISON:  Cervical spine MRI  12/11/2019. FINDINGS: The examination is intermittently motion degraded, limiting evaluation. Most notably, there is moderate motion degradation of the axial T2 GRE sequence, moderate motion degradation of the axial T2 weighted sequence and moderate/severe motion degradation of the postcontrast axial T1 weighted sequence. Alignment: Straightening of the expected cervical lordosis. No significant spondylolisthesis. Vertebrae: Vertebral body height is maintained. No focal suspicious osseous lesion or significant marrow edema. Cord: Similar to the prior MRI of 12/11/2019, there is extensive multifocal T2/STIR hyperintense signal abnormality throughout the majority of the cervical spinal cord. Within the limitations of motion degradation, no abnormal cord enhancement is identified to suggest active demyelination. There is associated mild diffuse atrophy of the cervical spinal cord. Posterior Fossa, vertebral arteries, paraspinal tissues: Posterior fossa  better assessed on the concurrently performed brain MRI. Flow voids preserved within the imaged cervical vertebral arteries. Paraspinal soft tissues within normal limits. Disc levels: Intervertebral disc height is maintained. No significant disc herniation, spinal canal stenosis or neural foraminal narrowing at any level. IMPRESSION: Motion degraded examination as described and limiting evaluation. Similar to the prior MRI of 12/11/2019, there is extensive multifocal T2 hyperintense signal abnormality affecting the majority of the cervical spinal cord. There is associated mild diffuse cord atrophy and these findings are consistent with the provided history of multiple sclerosis. Within the limitations of motion degradation, no abnormal enhancement of the cervical spinal cord is identified to suggest active demyelination. Electronically Signed   By: Jackey Loge DO   On: 07/15/2020 15:19   MR THORACIC SPINE W WO CONTRAST  Result Date: 07/15/2020 CLINICAL DATA:  Demyelinating disease; MS flare suspected, new left leg weakness. EXAM: MRI THORACIC WITHOUT AND WITH CONTRAST TECHNIQUE: Multiplanar and multiecho pulse sequences of the thoracic spine were obtained without and with intravenous contrast. CONTRAST:  5.26mL GADAVIST GADOBUTROL 1 MMOL/ML IV SOLN COMPARISON:  Thoracic spine MRI 12/16/2019. FINDINGS: MRI THORACIC SPINE FINDINGS Alignment:  No significant spondylolisthesis. Vertebrae: Vertebral body height is maintained. No suspicious osseous lesion or significant marrow edema. Cord: Again demonstrated is extensive multifocal T2/STIR hyperintense signal abnormality throughout the majority of the thoracic spinal cord. There are several lesions which are new or significantly progressed as compared to the prior examination of 12/16/2019 as follows. A lesion within the central cord at the T4 level (series 23, image 10). A lesion within the right dorsal lateral aspect of the cord at the T5 level (series 23, image  13). A lesion within the right aspect of the spinal cord at the T6 level (series 23, image 16). A lesion within the central cord at the T7-T8 level (series 23, image 19). A lesion within the central cord at the T11 level (series 23, image 30). Faint contrast enhancement is questioned at the sites of the T4 and T5 level lesions and this reflect active demyelination (series 26, image 11). No definite abnormal enhancement is identified elsewhere within the thoracic cord. Paraspinal and other soft tissues: No abnormality identified within included portions of the thorax or upper abdomen. Disc levels: At T4-T5, there is unchanged tiny central disc protrusion which partially effaces the ventral thecal sac and contacts the ventral spinal cord. No significant central canal stenosis. At T6-T7, there is an unchanged small central disc protrusion which partially effaces the ventral thecal sac and contacts the ventral spinal cord. No significant spinal canal stenosis. No significant foraminal narrowing at any level. IMPRESSION: Extensive multifocal T2 hyperintense signal abnormality affecting the majority of the thoracic spinal cord. There are T2 hyperintense lesions at the  T4, T5, T6, T7-T8 and T11 levels which are new or significantly progressed as compared to the MRI of 12/16/2019. These findings are consistent with the provided history of demyelinating disease/multiple sclerosis. There is questionable enhancement corresponding with the T4 and T5 level lesions and this may reflect active demyelination. Electronically Signed   By: Jackey Loge DO   On: 07/15/2020 15:38    Assessment/Plan: Diagnosis: Multiple sclerosis exacerbation Labs independently reviewed.  Records reviewed and summated above.  1. Does the need for close, 24 hr/day medical supervision in concert with the patient's rehab needs make it unreasonable for this patient to be served in a less intensive setting? Potentially  2. Co-Morbidities requiring  supervision/potential complications:  mild autism, hypotension (monitor with increased exertion), multiple sclerosis, steroid induced hyperglycemia (SSI, Monitor in accordance with exercise and adjust meds as necessary) 3. Due to bladder management, disease management, medication administration and patient education, does the patient require 24 hr/day rehab nursing? Potentially 4. Does the patient require coordinated care of a physician, rehab nurse, therapy disciplines of PT/OT to address physical and functional deficits in the context of the above medical diagnosis(es)? Potentially Addressing deficits in the following areas: balance, endurance, locomotion, strength, transferring, bowel/bladder control, bathing, dressing, toileting and psychosocial support 5. Can the patient actively participate in an intensive therapy program of at least 3 hrs of therapy per day at least 5 days per week? Yes 6. The potential for patient to make measurable gains while on inpatient rehab is excellent and good 7. Anticipated functional outcomes upon discharge from inpatient rehab are supervision  with PT, supervision with OT, modified independent with SLP. 8. Estimated rehab length of stay to reach the above functional goals is: 4-6 days. 9. Anticipated discharge destination: Home 10. Overall Rehab/Functional Prognosis: excellent  RECOMMENDATIONS: This patient's condition is appropriate for continued rehabilitative care in the following setting: Patient doing relatively well on day of evaluation.  We will continue to monitor with completion of steroids and consider CIR patient does not progress to supervision level of functioning. Patient has agreed to participate in recommended program. Yes Note that insurance prior authorization may be required for reimbursement for recommended care.  Comment: Rehab Admissions Coordinator to follow up.  I have personally performed a face to face diagnostic evaluation, including,  but not limited to relevant history and physical exam findings, of this patient and developed relevant assessment and plan.  Additionally, I have reviewed and concur with the physician assistant's documentation above.   Maryla Morrow, MD, ABPMR [facility] Rossetti Angiulli, PA-C 07/16/2020

## 2020-07-16 NOTE — PMR Pre-admission (Addendum)
PMR Admission Coordinator Pre-Admission Assessment  Patient: Jaime [name] is an 24 y.o., male MRN: [id number] DOB: [birth year] Height:   Weight: 53.6 kg              Insurance Information HMO:     PPO:      PCP:      IPA:      80/20:      OTHER:  PRIMARY: Medicare Jaime [name]       Policy#: [id number]      Subscriber:Patient  CM Name:       Phone#:      Fax#:  Pre-Cert#:       Employer:  Benefits:  Phone #:      Name:  Eff. Date:      Deduct:       Out of Pocket Max:       Life Max:   CIR: Covered per Medicaid guidelines  Outpatient:      Co-Pay:  Home Health:       Co-Pay:  DME:      Co-Pay:  Providers: in network  SECONDARY: none   Financial Counselor:     Phone#:   The Data processing manager" for patients in Inpatient Rehabilitation Facilities with attached "Privacy Act Statement-Health Care Records" was provided and verbally reviewed with: N/A  Emergency Contact Information Contact Information    Name Relation Home Work Mobile   [place] Mother [phone]  [phone]   Jaime [name]   [phone]     Current Medical History  Patient Admitting Diagnosis: MS exacerbation  History of Present Illness: : Jaime [name] is a 24 year old right-handed male history of mild autism, hypertension recently diagnosed multiple sclerosis March 2021 followed by neurology services Dr. Epimenio Foot.  Patient well-known to rehab services from multiple sclerosis exacerbations 12/17/2019 to 12/26/2019 as well as 05/24/2020 to 06/05/2020.  He was discharged home ambulating contact-guard assist.  He lives with his mother and 44 year old and 48 year old siblings.  Two-level home bed and bath upstairs.  Presented 07/15/2020 with relapsing multiple sclerosis weakness left greater than right.  MRI of the brain showed numerous subcentimeter enhancing lesions within the juxtacortical subcortical white matter of the bilateral cerebral hemispheres.  Placed on intravenous  Solu-Medrol x5 doses.  Subcutaneous Lovenox for DVT prophylaxis.  ProAmatine ongoing for history of orthostasis.  Tolerating a regular diet.  Therapy evaluations completed and patient was admitted for a comprehensive rehab program.  Past Medical History  Past Medical History:  Diagnosis Date  . Autism   . Depression   . MS (multiple sclerosis) (HCC)     Family History  family history includes Clotting disorder in his mother.  Prior Rehab/Hospitalizations:  Has the patient had prior rehab or hospitalizations prior to admission? Yes  Has the patient had major surgery during 100 days prior to admission? No  Current Medications   Current Facility-Administered Medications:  .  enoxaparin (LOVENOX) injection 40 mg, 40 mg, Subcutaneous, Q24H, Littie Deeds, MD, 40 mg at 07/17/20 1914 .  melatonin tablet 6 mg, 6 mg, Oral, QHS, Brimage, Vondra, DO .  methylPREDNISolone sodium succinate (SOLU-MEDROL) 1,000 mg in sodium chloride 0.9 % 50 mL IVPB, 1,000 mg, Intravenous, Q24H, Littie Deeds, MD, Last Rate: 58 mL/hr at 07/17/20 1530, 1,000 mg at 07/17/20 1530 .  midodrine (PROAMATINE) tablet 7.5 mg, 7.5 mg, Oral, TID WC, Littie Deeds, MD, 7.5 mg at 07/18/20 0907 .  pantoprazole (PROTONIX) EC tablet 40 mg, 40 mg, Oral, Daily, Littie Deeds, MD, 40  mg at 07/18/20 0907 .  [START ON 07/19/2020] polyethylene glycol (MIRALAX / GLYCOLAX) packet 17 g, 17 g, Oral, Daily, Brimage, Vondra, DO .  senna (SENOKOT) tablet 8.6 mg, 1 tablet, Oral, Daily, Brimage, Vondra, DO  Patients Current Diet:  Diet Order            Diet regular Room service appropriate? Yes; Fluid consistency: Thin  Diet effective now                 Precautions / Restrictions Precautions Precautions: Fall Restrictions Weight Bearing Restrictions: No   Has the patient had 2 or more falls or a fall with injury in the past year?No  Prior Activity Level Pt. Was going out 1-2x a week for appointments, was not working PTA.   Prior  Functional Level Prior Function Level of Independence: Needs assistance Gait / Transfers Assistance Needed: pt reports he has been ambulating with use of a walker recently. Fell on 10/8 attempting to get to the bathroom. Pt's mother has been assisting with stairs and ADLs  Self Care: Did the patient need help bathing, dressing, using the toilet or eating?  Independent  Indoor Mobility: Did the patient need assistance with walking from room to room (with or without device)? Independent  Stairs: Did the patient need assistance with internal or external stairs (with or without device)? Independent  Functional Cognition: Did the patient need help planning regular tasks such as shopping or remembering to take medications? Dependent  Home Assistive Devices / Equipment Home Assistive Devices/Equipment: Jeannette How (specify type) Home Equipment: Walker - 2 wheels  Prior Device Use: Indicate devices/aids used by the patient prior to current illness, exacerbation or injury? None of the above  Current Functional Level Cognition  Overall Cognitive Status: History of cognitive impairments - at baseline Orientation Level: Oriented X4 General Comments: pt with high functioning autism, follows commands well and good awarness of situation but requires increased time for problem sovling     Extremity Assessment (includes Sensation/Coordination)  Upper Extremity Assessment: RUE deficits/detail, LUE deficits/detail RUE Deficits / Details: grossly 4-/5. bilateral UE tremous RUE Sensation: WNL RUE Coordination: decreased gross motor, decreased fine motor LUE Deficits / Details: grossly 4-/5 LUE Sensation: WNL LUE Coordination: decreased fine motor, decreased gross motor  Lower Extremity Assessment: Defer to PT evaluation RLE Deficits / Details: grossly 4+/5 RLE Sensation: decreased light touch LLE Deficits / Details: L knee flexion/extension 4/5, PF/DF 3/5 LLE Sensation: decreased light  touch LLE Coordination: decreased gross motor    ADLs  Overall ADL's : Needs assistance/impaired Eating/Feeding Details (indicate cue type and reason): reports using L UE more than R due to tremors--would benefit from weighted utensils  Grooming: Minimal assistance, Standing Grooming Details (indicate cue type and reason): to engage in standing tasks at sink, declined grooming tasks but simulated requring min assist for balance (pt even leaning against sink)  Upper Body Bathing: Minimal assistance, Sitting Lower Body Bathing: Moderate assistance, Sit to/from stand, Sitting/lateral leans Upper Body Dressing : Minimal assistance, Sitting Lower Body Dressing: Moderate assistance, Sit to/from stand Lower Body Dressing Details (indicate cue type and reason): able to adjust socks, relies on BUE support standing  Toilet Transfer: Minimal assistance, Ambulation, RW Toilet Transfer Details (indicate cue type and reason): simulated to recliner  Toileting- Clothing Manipulation and Hygiene: Maximal assistance, Sit to/from stand Toileting - Clothing Manipulation Details (indicate cue type and reason): to manage underwear due to decreased standing balance Functional mobility during ADLs: Minimal assistance, Rolling walker, Cueing for  safety, Cueing for sequencing General ADL Comments: patient requiring increased assistance for self care due to decreased balance, activity tolerance, safety awareness    Mobility  Overal bed mobility: Needs Assistance Bed Mobility: Supine to Sit Supine to sit: Supervision, HOB elevated Sit to supine: Supervision General bed mobility comments: HOB elevated, increased time but no physical assist required    Transfers  Overall transfer level: Needs assistance Equipment used: Rolling walker (2 wheeled) Transfers: Sit to/from Stand Sit to Stand: Min assist General transfer comment: min assist to power up and steady, cueing for hand placement/safety with RW      Ambulation / Gait / Stairs / Wheelchair Mobility  Ambulation/Gait Ambulation/Gait assistance: Editor, commissioning (Feet): 10 Feet Assistive device: Rolling walker (2 wheeled) Gait Pattern/deviations: Ataxic, Trunk flexed (exaggerated hip and knee flexion) General Gait Details: Pt gait appears uncoordinated with exaggerated hip and knee flexion. Mildly unsteady with min A to correct. Cues for postural control and drawing attention to pt's gait deviations. Gait velocity: reduced Gait velocity interpretation: <1.31 ft/sec, indicative of household ambulator    Posture / Balance Dynamic Sitting Balance Sitting balance - Comments: min guard to close supervision for safety  Balance Overall balance assessment: Needs assistance Sitting-balance support: No upper extremity supported, Feet supported Sitting balance-Leahy Scale: Fair Sitting balance - Comments: min guard to close supervision for safety  Postural control: Posterior lean Standing balance support: Bilateral upper extremity supported, During functional activity (or trunk support leaning on sink) Standing balance-Leahy Scale: Poor Standing balance comment: relies on BUE and external support    Special needs/care consideration  None     Previous Home Environment (from acute therapy documentation) Living Arrangements: Parent, Other relatives Available Help at Discharge: Family, Available 24 hours/day Type of Home: Apartment Home Layout: Two level, Bed/bath upstairs Alternate Level Stairs-Rails: Right Alternate Level Stairs-Number of Steps: full flight Home [name]: Stairs to enter Entrance Stairs-Rails: Can reach both Entrance Stairs-Number of Steps: 2 without rail at back entrance, 4 wiht bil rail at front entrance, per pt Bathroom Shower/Tub: Associate Professor: Yes Home Care Services: No  Discharge Living Setting Plans for Discharge Living Setting: Patient's home Type of  Home at Discharge: Apartment Discharge Home Layout: Two level Alternate Level Stairs-Rails: Right Alternate Level Stairs-Number of Steps: full flight Discharge Home [name]: Stairs to enter Entrance Stairs-Rails: None Entrance Stairs-Number of Steps:  (2) Discharge Bathroom Shower/Tub: Tub/shower unit Discharge Bathroom Toilet: Standard Discharge Bathroom Accessibility: Yes Does the patient have any problems obtaining your medications?: No  Social/Family/Support Systems Patient Roles: Parent Contact Information: [phone] Anticipated Caregiver: Jahmil Macleod  Anticipated Caregiver's Contact Information: [phone] Ability/Limitations of Caregiver:  (none ) Caregiver Availability: 24/7 Discharge Plan Discussed with Primary Caregiver: Yes Is Caregiver In Agreement with Plan?: Yes   Goals Patient/Family Goal for Rehab: PT/OT Mod I  Expected length of stay: 5-7 days Pt/Family Agrees to Admission and willing to participate: Yes Program Orientation Provided & Reviewed with Pt/Caregiver Including Roles  & Responsibilities: Yes   Decrease burden of Care through IP rehab admission: Specialzed equipment needs, Diet advancement, Decrease number of caregivers, Patient/family education   Possible need for SNF placement upon discharge: Not anticipated   Patient Condition: This patient's medical and functional status has changed since the consult dated: 07/16/2020 in which the Rehabilitation Physician determined and documented that the patient's condition is appropriate for intensive rehabilitative care in an inpatient rehabilitation facility. See "History of Present Illness" (above) for medical update. Functional changes  are:Pt. Ambulating 10 ft with min assist. Patient's medical and functional status update has been discussed with the Rehabilitation physician and patient remains appropriate for inpatient rehabilitation. Will admit to inpatient rehab today.  Preadmission Screen Completed  By:  Jeronimo Greaves, CCC-SLP, 07/18/2020 2:28 PM ______________________________________________________________________   Discussed status with Dr. Wynn Banker on 07/18/20 at 1430 and received approval for admission tomorrow.  Admission Coordinator:  Jeronimo Greaves, time [address] 07/18/2020

## 2020-07-16 NOTE — Evaluation (Signed)
Clinical/Bedside Swallow Evaluation Patient Details  Name: Jaime [name] MRN: [id number] Date of Birth: January 28, 1996  Today's Date: 07/16/2020 Time: SLP Start Time (ACUTE ONLY): 1012 SLP Stop Time (ACUTE ONLY): 1018 SLP Time Calculation (min) (ACUTE ONLY): 6 min  Past Medical History:  Past Medical History:  Diagnosis Date  . Autism   . Depression   . MS (multiple sclerosis) (HCC)    Past Surgical History:  Past Surgical History:  Procedure Laterality Date  . NO PAST SURGERIES     HPI:  Jaime [name] is a 24 y.o. male presenting with left leg weakness due to multiple sclerosis flare. PMH is significant for relapsing remitting MS, POTS, autism (high-functioning), and depression.  MRI 10/12 reflects MS with indications of demyelenation.   Assessment / Plan / Recommendation Clinical Impression  Pt presents with functional swallowing as assessed clinically.  There were no clinical indicators of dyphagia with any consistencies trialed and pt exhibited good clearance of solids.  Pt denies deficits with swallowing this admission and was eating AM meal tray without difficulty on SLP arrival.  Recommend continuing regular texture diet with thin liquids. SLP will sign off at this time.  If there is a decline in swallow function, please re-consult ST.   SLP Visit Diagnosis: Dysphagia, unspecified (R13.10)    Aspiration Risk  No limitations    Diet Recommendation Regular;Thin liquid   Liquid Administration via: Straw;Cup Medication Administration: Whole meds with liquid Supervision: Patient able to self feed Compensations: Slow rate;Small sips/bites Postural Changes: Seated upright at 90 degrees    Other  Recommendations Oral Care Recommendations: Oral care BID   Follow up Recommendations None      Frequency and Duration  (N/A)          Prognosis Prognosis for Safe Diet Advancement:  (N/A)      Swallow Study   General Date of Onset: 07/15/20 HPI: Jaime [name] is  a 24 y.o. male presenting with left leg weakness due to multiple sclerosis flare. PMH is significant for relapsing remitting MS, POTS, autism (high-functioning), and depression.  MRI 10/12 reflects MS with indications of demyelenation. Type of Study: Bedside Swallow Evaluation Previous Swallow Assessment: Seed during previous admission in August with grossly normal swallow function noted Diet Prior to this Study: Regular Temperature Spikes Noted: No Respiratory Status: Room air History of Recent Intubation: No Behavior/Cognition: Alert;Cooperative;Pleasant mood Oral Cavity Assessment: Within Functional Limits Oral Care Completed by SLP: No Oral Cavity - Dentition: Adequate natural dentition Vision: Functional for self-feeding Self-Feeding Abilities: Able to feed self Patient Positioning: Upright in bed Baseline Vocal Quality: Normal Volitional Cough: Strong Volitional Swallow: Able to elicit    Oral/Motor/Sensory Function Overall Oral Motor/Sensory Function: Within functional limits Facial ROM: Within Functional Limits Facial Symmetry: Within Functional Limits Lingual ROM: Within Functional Limits Lingual Symmetry: Within Functional Limits Lingual Strength: Within Functional Limits Velum: Within Functional Limits Mandible: Within Functional Limits   Ice Chips Ice chips: Not tested   Thin Liquid Thin Liquid: Within functional limits Presentation: Straw;Self Fed    Nectar Thick Nectar Thick Liquid: Not tested   Honey Thick     Puree Puree: Within functional limits Presentation: Spoon   Solid     Solid: Within functional limits Presentation: Self Fed      Kerrie Pleasure, MA, CCC-SLP Acute Rehabilitation Services Office: [phone] 07/16/2020,11:02 AM

## 2020-07-16 NOTE — Evaluation (Signed)
Physical Therapy Evaluation Patient Details Name: Jaime [name] MRN: [id number] DOB: [birth year] Today's Date: 07/16/2020   History of Present Illness  24 y.o. male presenting with left leg weakness due to multiple sclerosis flare. PMH is significant for relapsing remitting MS, POTS, autism (high-functioning), and depression.  Clinical Impression  Pt presents to PT with deficits in functional mobility, gait, balance, endurance, strength, power, coordination, and sensation. Pt demonstrates LLE weakness and impaired coordination in LLE and BUEs. Pt with significant intention tremor in BUEs, impairing the pt's ability to safely and reliably utilize DME. Pt requires assistance for all OOB mobility to improve balance and reduce falls risk. Pt will benefit from acute PT POC to aide in a return to independent mobility and reduce falls risk. PT recommends CIR placement at this time as the pt is motivated and demonstrates, has 24/7 caregiver support, and demonstrates the potential to return to a supervision mobility level.    Follow Up Recommendations CIR    Equipment Recommendations       Recommendations for Other Services Rehab consult     Precautions / Restrictions Precautions Precautions: Fall Restrictions Weight Bearing Restrictions: No      Mobility  Bed Mobility Overal bed mobility: Needs Assistance Bed Mobility: Supine to Sit;Sit to Supine     Supine to sit: Supervision Sit to supine: Supervision      Transfers Overall transfer level: Needs assistance Equipment used: Rolling walker (2 wheeled) Transfers: Sit to/from Stand Sit to Stand: Min assist            Ambulation/Gait Ambulation/Gait assistance: Min Chemical engineer (Feet): 10 Feet Assistive device: Rolling walker (2 wheeled) Gait Pattern/deviations: Step-to pattern Gait velocity: reduced Gait velocity interpretation: <1.31 ft/sec, indicative of household ambulator General Gait Details: pt with short  step to gait, tremors noted during ambulation  Stairs            Wheelchair Mobility    Modified Rankin (Stroke Patients Only)       Balance Overall balance assessment: Needs assistance Sitting-balance support: No upper extremity supported;Feet supported Sitting balance-Leahy Scale: Good     Standing balance support: Bilateral upper extremity supported Standing balance-Leahy Scale: Poor Standing balance comment: reliant on BUE support and minG-minA                             Pertinent Vitals/Pain Pain Assessment: No/denies pain    Home Living Family/patient expects to be discharged to:: Private residence Living Arrangements: Parent;Other relatives Available Help at Discharge: Family;Available 24 hours/day Type of Home: Apartment Home Access: Stairs to enter Entrance Stairs-Rails: Can reach both Entrance Stairs-Number of Steps: 2 without rail at back entrance, 4 wiht bil rail at front entrance, per pt Home Layout: Two level;Bed/bath upstairs Home Equipment: Walker - 2 wheels      Prior Function Level of Independence: Needs assistance   Gait / Transfers Assistance Needed: pt reports he has been ambulating with use of a walker recently. Fell on 10/8 attempting to get to the bathroom. Pt's mother has been assisting with stairs and ADLs           Hand Dominance   Dominant Hand: Right    Extremity/Trunk Assessment   Upper Extremity Assessment Upper Extremity Assessment: RUE deficits/detail;LUE deficits/detail RUE Deficits / Details: grossly 4/5 RUE Coordination: decreased gross motor;decreased fine motor LUE Deficits / Details: grossly 4/5 LUE Coordination: decreased fine motor;decreased gross motor    Lower Extremity Assessment  Lower Extremity Assessment: LLE deficits/detail;RLE deficits/detail RLE Deficits / Details: grossly 4+/5 RLE Sensation: decreased light touch LLE Deficits / Details: L knee flexion/extension 4/5, PF/DF 3/5 LLE  Sensation: decreased light touch LLE Coordination: decreased gross motor    Cervical / Trunk Assessment Cervical / Trunk Assessment: Normal  Communication   Communication: No difficulties  Cognition Arousal/Alertness: Awake/alert Behavior During Therapy: Flat affect Overall Cognitive Status: History of cognitive impairments - at baseline                                 General Comments: slowed processing at times, pt with history of high functioning autism. Pt has good awareness of situation and follows commands well.      General Comments General comments (skin integrity, edema, etc.): VSS on RA    Exercises     Assessment/Plan    PT Assessment Patient needs continued PT services  PT Problem List Decreased strength;Decreased activity tolerance;Decreased balance;Decreased mobility;Decreased coordination;Decreased knowledge of use of DME;Decreased safety awareness;Decreased knowledge of precautions;Impaired sensation       PT Treatment Interventions DME instruction;Gait training;Stair training;Functional mobility training;Therapeutic activities;Therapeutic exercise;Balance training;Neuromuscular re-education;Patient/family education    PT Goals (Current goals can be found in the Care Plan section)  Acute Rehab PT Goals Patient Stated Goal: To improve mobility quality and reduce falls risk PT Goal Formulation: With patient Time For Goal Achievement: 07/30/20 Potential to Achieve Goals: Good    Frequency Min 3X/week   Barriers to discharge        Co-evaluation               AM-PAC PT "6 Clicks" Mobility  Outcome Measure Help needed turning from your back to your side while in a flat bed without using bedrails?: None Help needed moving from lying on your back to sitting on the side of a flat bed without using bedrails?: None Help needed moving to and from a bed to a chair (including a wheelchair)?: A Little Help needed standing up from a chair using  your arms (e.g., wheelchair or bedside chair)?: A Little Help needed to walk in hospital room?: A Little Help needed climbing 3-5 steps with a railing? : A Lot 6 Click Score: 19    End of Session   Activity Tolerance: Patient tolerated treatment well Patient left: in bed;with call bell/phone within reach;with bed alarm set Nurse Communication: Mobility status PT Visit Diagnosis: Unsteadiness on feet (R26.81);Muscle weakness (generalized) (M62.81);Other symptoms and signs involving the nervous system (R29.898);Other abnormalities of gait and mobility (R26.89)    Time: 6553-7482 PT Time Calculation (min) (ACUTE ONLY): 24 min   Charges:   PT Evaluation $PT Eval Moderate Complexity: 1 Mod          Arlyss Gandy, PT, DPT Acute Rehabilitation Pager: [phone]   Arlyss Gandy 07/16/2020, 10:04 AM

## 2020-07-17 DIAGNOSIS — G35 Multiple sclerosis: Secondary | ICD-10-CM | POA: Diagnosis not present

## 2020-07-17 DIAGNOSIS — I951 Orthostatic hypotension: Secondary | ICD-10-CM | POA: Diagnosis not present

## 2020-07-17 DIAGNOSIS — F840 Autistic disorder: Secondary | ICD-10-CM | POA: Diagnosis not present

## 2020-07-17 DIAGNOSIS — R29898 Other symptoms and signs involving the musculoskeletal system: Secondary | ICD-10-CM | POA: Diagnosis not present

## 2020-07-17 NOTE — Telephone Encounter (Signed)
Located start form, mother needs to intial in three spots on one of the pages. This was missed. I called and spoke with mother, she plans to stop by today before 5pm to sign. She has been busy taking care of her son and two daughters who are in virtual school currently. Advised once this is completed, we will send back to [employer]. She can then call [facility] as previously discussed to get him scheduled for Tysabri. She verbalized understanding. She questioned if pt could receive while admitted at the hospital. Advised I did not know this answer, best to speak with doctor at the hospital about this. She verbalized understanding.

## 2020-07-17 NOTE — Discharge Summary (Signed)
Family Medicine Teaching [facility] Discharge Summary  Patient name: Jaime [name] Medical record number: [id number] Date of birth: 16-Jan-1996 Age: 24 y.o. Gender: male Date of Admission: 07/15/2020  Date of Discharge: 07/19/20 Admitting Physician: Katha Cabal, DO  Primary Care Provider: Macy Mis, MD Consultants: Neurology  Indication for Hospitalization: Acute flare of multiple sclerosis  Discharge Diagnoses/Problem List:  Relapsing remitting multiple sclerosis Postural orthostatic tachycardia syndrome GERD Depression Autism spectrum disorder  Disposition: CIR  Discharge Condition: Stable and improved  Discharge Exam:  Physical Exam: General: Patient laying comfortably in bed, in no acute distress. Cardiovascular: RRR, no murmurs or gallops  Respiratory: lungs clear to auscultation  Abdomen: soft, nontender, presence of active bowel sounds  Neuro: AOx4 Extremities: radial pulses intact bilaterally  Taken from Dr. Judie Grieve progress note on the day of discharge.   Brief Hospital Course:  Patient is a 24 y.o. male presenting with LLE weakness d/t MS flare. PMH is significant for relapsing remitting MS, POTS, high-functioning autism, and depression.  Relapsing remitting multiple sclerosis flare, improved Patient with a history of relapsing remitting MS diagnosed in March 2021 and has had 2 subsequent hospitalizations for flares, in March and August respectively. At baseline, he is able to ambulate independently and has a tremor in his right hand. He follows neurology with Dr. Epimenio Foot. He presented initially with LE weakness, numbness, and tingling that seemed to be worse on the left. He also experienced worsening of his hand tremor, slurred speech, swallowing difficulty and urinary incontinence. MRI brain revealed multiple new white matter lesions in the bilateral cerebral hemispheres that were not seen on previous MRI in August 2021. MR T-spine also showed evidence  of active demyelination at T4-5. Patient was started on IV steroids in the ED. Neurology recommended 5 days of IV methylprednisolone 1000 mg. SLP evaluated patient and recommended a regular diet with thin liquids. PT consulted and recommended CIR on discharge. CIR spoke with the patient, but was unable to accept him while he was still on IV steroids.   Home medications were continued for other chronic diseases which remained stable.    Issues for Follow Up:  1. Follow up with PCP regarding COVID vaccination and starting on Zoloft for depression.  2. Follow up with neurology for continued MS management. Facilitate process with mom and Dr. Epimenio Foot (neurologist) on starting patient on MS directed infusions (Tysabri).  Significant Procedures: None  Significant Labs and Imaging:  Recent Labs  Lab 07/15/20 0829 07/16/20 0733 07/18/20 0255  WBC 9.7 10.4 19.4*  HGB 14.6 15.5 13.9  HCT 44.0 45.2 40.5  PLT 232 258 282   Recent Labs  Lab 07/15/20 0829 07/15/20 0829 07/16/20 0733 07/18/20 0255  NA 141  --  139 139  K 3.6   < > 4.0 4.0  CL 105  --  105 105  CO2 26  --  22 25  GLUCOSE 88  --  141* 149*  BUN 17  --  15 14  CREATININE 0.82  --  0.88 0.84  CALCIUM 9.8  --  10.1 9.4   < > = values in this interval not displayed.    MR Brain W and Wo Contrast  Result Date: 07/15/2020 CLINICAL DATA:  Multiple sclerosis, new event; new left leg weakness. EXAM: MRI HEAD WITHOUT AND WITH CONTRAST TECHNIQUE: Multiplanar, multiecho pulse sequences of the brain and surrounding structures were obtained without and with intravenous contrast. CONTRAST:  5.61mL GADAVIST GADOBUTROL 1 MMOL/ML IV SOLN COMPARISON:  Brain  MRI 05/19/2020. FINDINGS: Brain: Mild intermittent motion degradation. This includes mild motion degradation of the axial T1 weighted postcontrast sequence. There are numerous subcentimeter enhancing lesions within the juxtacortical and subcortical white matter of the bilateral cerebral  hemispheres (at least 15-20 in number) (see annotations on series 32, image 34). The majority of, if not all of, these enhancing lesions appear new as compared to the prior examination of 05/19/2020. As before, there is background severe multifocal T2/FLAIR hyperintense signal abnormality within the subcortical/juxtacortical and deep cerebral white matter, corpus callosum, thalami, brainstem, cerebellum and middle cerebellar peduncles. No evidence of intracranial mass. No chronic intracranial blood products. No extra-axial fluid collection. No midline shift. Vascular: Expected proximal arterial flow voids. Skull and upper cervical spine: No focal marrow lesion. Sinuses/Orbits: Visualized orbits show no acute finding. Trace scattered paranasal sinus mucosal thickening. No significant mastoid effusion. IMPRESSION: Mildly motion degraded examination. Numerous (numbering at least 15-20) subcentimeter enhancing lesions within the juxtacortical and subcortical white matter of the bilateral cerebral hemispheres. The majority of, if not all of, these enhancing lesions appear new as compared to the MRI of 05/19/2020 and are consistent with sites of active demyelination. As before, there is background severe multifocal T2 hyperintense signal abnormality within the cerebral white matter, corpus callosum, thalami, brainstem, cerebellum and middle cerebellar peduncles. These findings are consistent with the provided history of multiple sclerosis. Electronically Signed   By: Jackey Loge DO   On: 07/15/2020 15:09   MR Cervical Spine W or Wo Contrast  Result Date: 07/15/2020 CLINICAL DATA:  Multiple sclerosis, new event. Additional provided: Weakness, difficulty walking. EXAM: MRI CERVICAL SPINE WITHOUT AND WITH CONTRAST TECHNIQUE: Multiplanar and multiecho pulse sequences of the cervical spine, to include the craniocervical junction and cervicothoracic junction, were obtained without and with intravenous contrast. CONTRAST:   5.64mL GADAVIST GADOBUTROL 1 MMOL/ML IV SOLN COMPARISON:  Cervical spine MRI 12/11/2019. FINDINGS: The examination is intermittently motion degraded, limiting evaluation. Most notably, there is moderate motion degradation of the axial T2 GRE sequence, moderate motion degradation of the axial T2 weighted sequence and moderate/severe motion degradation of the postcontrast axial T1 weighted sequence. Alignment: Straightening of the expected cervical lordosis. No significant spondylolisthesis. Vertebrae: Vertebral body height is maintained. No focal suspicious osseous lesion or significant marrow edema. Cord: Similar to the prior MRI of 12/11/2019, there is extensive multifocal T2/STIR hyperintense signal abnormality throughout the majority of the cervical spinal cord. Within the limitations of motion degradation, no abnormal cord enhancement is identified to suggest active demyelination. There is associated mild diffuse atrophy of the cervical spinal cord. Posterior Fossa, vertebral arteries, paraspinal tissues: Posterior fossa better assessed on the concurrently performed brain MRI. Flow voids preserved within the imaged cervical vertebral arteries. Paraspinal soft tissues within normal limits. Disc levels: Intervertebral disc height is maintained. No significant disc herniation, spinal canal stenosis or neural foraminal narrowing at any level. IMPRESSION: Motion degraded examination as described and limiting evaluation. Similar to the prior MRI of 12/11/2019, there is extensive multifocal T2 hyperintense signal abnormality affecting the majority of the cervical spinal cord. There is associated mild diffuse cord atrophy and these findings are consistent with the provided history of multiple sclerosis. Within the limitations of motion degradation, no abnormal enhancement of the cervical spinal cord is identified to suggest active demyelination. Electronically Signed   By: Jackey Loge DO   On: 07/15/2020 15:19   MR  THORACIC SPINE W WO CONTRAST  Result Date: 07/15/2020 CLINICAL DATA:  Demyelinating disease; MS flare suspected,  new left leg weakness. EXAM: MRI THORACIC WITHOUT AND WITH CONTRAST TECHNIQUE: Multiplanar and multiecho pulse sequences of the thoracic spine were obtained without and with intravenous contrast. CONTRAST:  5.62mL GADAVIST GADOBUTROL 1 MMOL/ML IV SOLN COMPARISON:  Thoracic spine MRI 12/16/2019. FINDINGS: MRI THORACIC SPINE FINDINGS Alignment:  No significant spondylolisthesis. Vertebrae: Vertebral body height is maintained. No suspicious osseous lesion or significant marrow edema. Cord: Again demonstrated is extensive multifocal T2/STIR hyperintense signal abnormality throughout the majority of the thoracic spinal cord. There are several lesions which are new or significantly progressed as compared to the prior examination of 12/16/2019 as follows. A lesion within the central cord at the T4 level (series 23, image 10). A lesion within the right dorsal lateral aspect of the cord at the T5 level (series 23, image 13). A lesion within the right aspect of the spinal cord at the T6 level (series 23, image 16). A lesion within the central cord at the T7-T8 level (series 23, image 19). A lesion within the central cord at the T11 level (series 23, image 30). Faint contrast enhancement is questioned at the sites of the T4 and T5 level lesions and this reflect active demyelination (series 26, image 11). No definite abnormal enhancement is identified elsewhere within the thoracic cord. Paraspinal and other soft tissues: No abnormality identified within included portions of the thorax or upper abdomen. Disc levels: At T4-T5, there is unchanged tiny central disc protrusion which partially effaces the ventral thecal sac and contacts the ventral spinal cord. No significant central canal stenosis. At T6-T7, there is an unchanged small central disc protrusion which partially effaces the ventral thecal sac and contacts  the ventral spinal cord. No significant spinal canal stenosis. No significant foraminal narrowing at any level. IMPRESSION: Extensive multifocal T2 hyperintense signal abnormality affecting the majority of the thoracic spinal cord. There are T2 hyperintense lesions at the T4, T5, T6, T7-T8 and T11 levels which are new or significantly progressed as compared to the MRI of 12/16/2019. These findings are consistent with the provided history of demyelinating disease/multiple sclerosis. There is questionable enhancement corresponding with the T4 and T5 level lesions and this may reflect active demyelination. Electronically Signed   By: Jackey Loge DO   On: 07/15/2020 15:38     Results/Tests Pending at Time of Discharge:   Discharge Medications:  Allergies as of 07/19/2020   No Known Allergies     Medication List    STOP taking these medications   sertraline 50 MG tablet Commonly known as: Zoloft     TAKE these medications   acetaminophen 325 MG tablet Commonly known as: TYLENOL Take 2 tablets (650 mg total) by mouth every 4 (four) hours as needed for mild pain or fever (or Fever >/= 101).   melatonin 3 MG Tabs tablet Take 2 tablets (6 mg total) by mouth at bedtime.   midodrine 2.5 MG tablet Commonly known as: PROAMATINE Take 3 tablets (7.5 mg total) by mouth 3 (three) times daily with meals.   pantoprazole 40 MG tablet Commonly known as: PROTONIX Take 1 tablet (40 mg total) by mouth daily.   senna 8.6 MG Tabs tablet Commonly known as: SENOKOT Take 2 tablets (17.2 mg total) by mouth 2 (two) times daily at 10 AM and 5 PM.       Discharge Instructions: Please refer to Patient Instructions section of EMR for full details.  Patient was counseled important signs and symptoms that should prompt return to medical care, changes in medications,  dietary instructions, activity restrictions, and follow up appointments.   Follow-Up Appointments:   Katha Cabal, DO 07/19/2020, 4:46  PM

## 2020-07-17 NOTE — Progress Notes (Addendum)
Family Medicine Teaching Service Daily Progress Note Intern Pager: [phone]  Patient name: Jaime [name] Medical record number: [id number] Date of birth: 08/23/1996 Age: 24 y.o. Gender: male  Primary Care Provider: Macy Mis, MD Consultants: Neurology Code Status: Full  Pt Overview and Major Events to Date:  Patient admitted on 10/12  Assessment and Plan:  Patient is a 24 y.o. male presenting with LLE weakness d/t MS flare. PMH is significant for relapsing remitting MS, POTS, high-functioning autism, and depression.  Relapsing remitting multiple sclerosis MRI brain demonstrated multiple new white matter lesions in bilateral cerebral hemispheres that were not seen on previous MRI brain in 8/21. Findings consistent with active demyelination. MR T spine demonstrated active demyelination at T4-T5. Neurology recommends IV methylprednisolone 1000 mg x5 days to treat acute flare. Patient started on IV steroids in ED. Currently on day 3. SLP evaluated patient and recommends regular diet with no restrictions. PT recommended CIR. CIR willing to accept patient, but will need to finish his IV steroids prior. Labs will be redrawn on Friday, 10/15. - Continue IV methylprednisolone 1000 mg qd - Bladder scan q8h - Will update mother about status today  Postural orthostatic tachycardia syndrome Home medications include midrodrine 7.5 mg TID. BP has remained stable throughout admission. Most recent BP this morning 115/76. - Continue midrodrine 7.5 mg TID   GERD Home medications include omeprazole 40 mg qd. - Start Protonix 40 mg qd  Insomnia Patient's home medications include melatonin 6 mg prn at bedtime. - Continue melatonin 6 mg prn  Depression Patient has been prescribed Zoloft, but has not started taking this medication yet. Holding now. He can start as outpatient.   Autism spectrum disorder Patient has high functioning autism. Will need to update his mother regularly.    Covid  vaccination Patient has not received Covid vaccination and is interested in receiving one if possible.  - Consider vaccination prior to discharge  FEN/GI: Regular PPx: Lovenox  Disposition: Med-Tele, possible d/c to CIR on Sat.  Subjective:  No acute overnight events. Patient evaluated at the bedside this morning. He denies any questions or concerns today. He does state that he did not sleep well overnight, only getting about 1 hour of quality sleep. He is tired on exam this morning. Denies any current pain or problems, but is requesting to be left alone to sleep.   Objective: Temp:  [98.2 F (36.8 C)-98.5 F (36.9 C)] 98.4 F (36.9 C) (10/14 0518) Pulse Rate:  [60-96] 71 (10/14 0518) Resp:  [16-18] 18 (10/14 0518) BP: (115-122)/(67-76) 115/76 (10/14 0518) SpO2:  [96 %-98 %] 96 % (10/14 0518) Physical Exam: General: Patient is a pleasant young male, lying supine in bed, resting comfortably, in no apparent distress.  Cardiovascular: Normal S1 and S2 with no murmurs, rubs, or gallops. RRR. Distal pulses 2+ and symmetrical. Respiratory: CTA in all fields with no wheezing, rales, or rhonchi. Chest rise is symmetrical with no increased labor of breathing. Abdominal: Soft, non distended, non tender to palpation. No hepatosplenomegaly appreciated. Bowel sounds normoactive. Skin: No active rashes or lesions on exam. Extremities: No peripheral edema or cyanosis.  Neurological: Alert and oriented x4. Normal mentation. CN II-XII grossly intact. MS 5/5 in RUE and RLE. MS 4/5 in LLE and LUE. Tremor present in RUE. More pronounced on finger to nose. Heel to shin intact bilaterally. Sensation to light touch intact bilaterally.   Laboratory: Recent Labs  Lab 07/15/20 0829 07/16/20 0733  WBC 9.7 10.4  HGB 14.6 15.5  HCT 44.0 45.2  PLT 232 258   Recent Labs  Lab 07/15/20 0829 07/16/20 0733  NA 141 139  K 3.6 4.0  CL 105 105  CO2 26 22  BUN 17 15  CREATININE 0.82 0.88  CALCIUM  9.8 10.1  GLUCOSE 88 141*    Imaging/Diagnostic Tests: No results found.   Stacey Drain T, Medical Student 07/17/2020, 7:42 AM FPTS Intern pager: [phone], text pages welcome   Resident Addendum I have separately seen and examined the patient.  I have discussed the findings and exam with the student and agree with the above note.  I helped develop the management plan that is described in the student's note and I agree with the content.    Pt sleeping when I checked on him this morning so I did not awake him given his insomnia issues.  Awaiting IV steroids to complete course.  We will need to touch base with PM&R prior to his completion of his steroids.   Lenor Coffin, MD PGY-3 [facility] residency program

## 2020-07-18 LAB — CBC
HCT: 40.5 % (ref 39.0–52.0)
Hemoglobin: 13.9 g/dL (ref 13.0–17.0)
MCH: 31.7 pg (ref 26.0–34.0)
MCHC: 34.3 g/dL (ref 30.0–36.0)
MCV: 92.3 fL (ref 80.0–100.0)
Platelets: 282 10*3/uL (ref 150–400)
RBC: 4.39 MIL/uL (ref 4.22–5.81)
RDW: 13.9 % (ref 11.5–15.5)
WBC: 19.4 10*3/uL — ABNORMAL HIGH (ref 4.0–10.5)
nRBC: 0 % (ref 0.0–0.2)

## 2020-07-18 LAB — BASIC METABOLIC PANEL
Anion gap: 9 (ref 5–15)
BUN: 14 mg/dL (ref 6–20)
CO2: 25 mmol/L (ref 22–32)
Calcium: 9.4 mg/dL (ref 8.9–10.3)
Chloride: 105 mmol/L (ref 98–111)
Creatinine, Ser: 0.84 mg/dL (ref 0.61–1.24)
GFR, Estimated: >60 mL/min (ref >60)
Glucose, Bld: 149 mg/dL — ABNORMAL HIGH (ref 70–99)
Potassium: 4 mmol/L (ref 3.5–5.1)
Sodium: 139 mmol/L (ref 135–145)

## 2020-07-18 MED ORDER — POLYETHYLENE GLYCOL 3350 17 G PO PACK
17.0000 g | PACK | Freq: Every day | ORAL | Status: DC
  Administered 2020-07-19: 17 g via ORAL
  Filled 2020-07-18: qty 1

## 2020-07-18 MED ORDER — MELATONIN 3 MG PO TABS
6.0000 mg | ORAL_TABLET | Freq: Every day | ORAL | Status: DC
  Administered 2020-07-18: 6 mg via ORAL
  Filled 2020-07-18 (×2): qty 2

## 2020-07-18 MED ORDER — SENNA 8.6 MG PO TABS
1.0000 | ORAL_TABLET | Freq: Every day | ORAL | Status: DC
  Administered 2020-07-18 – 2020-07-19 (×2): 8.6 mg via ORAL
  Filled 2020-07-18 (×2): qty 1

## 2020-07-18 NOTE — Hospital Course (Addendum)
Patient is a 24 y.o. male presenting with LLE weakness d/t MS flare. PMH is significant for relapsing remitting MS, POTS, high-functioning autism, and depression.  Relapsing remitting multiple sclerosis flare, improved Patient with a history of relapsing remitting MS diagnosed in March 2021 and has had 2 subsequent hospitalizations for flares, in March and August respectively. At baseline, he is able to ambulate independently and has a tremor in his right hand. He follows neurology with Dr. Epimenio Foot. He presented initially with LE weakness, numbness, and tingling that seemed to be worse on the left. He also experienced worsening of his hand tremor and slurred speech. His mother reported that the patient had experienced some swallowing difficulty and urinary incontinence. MRI brain revealed multiple new white matter lesions in the bilateral cerebral hemispheres that were not seen on previous MRI in August 2021. MR T spine also showed evidence of active demyelination at T4-5. Patient was started on IV steroids in the ED. Neurology recommended 5 days of IV methylprednisolone 1000 mg. SLP evaluated patient and recommended a regular diet with thin liquids. PT consulted and recommended CIR on discharge. CIR spoke with the patient, but was unable to accept him while he was still on IV steroids.   Postural orthostatic tachycardia syndrome, stable Patient's home medications included midrodrine 7.5 mg TID. He was started on his home medications at admission. BP remained stable throughout admission.  Insomnia Home medications included melatonin 6 mg prn qhs. Patient on 10/14 that he had trouble sleeping overnight and was drowsy throughout the day. On 10/15, he endorsed sleeping better, but stated that he had not been asking for his melatonin at night, which has been very successful at helping him sleep as an outpatient. His melatonin was changed from prn to scheduled so he would not have to ask for it while in the  hospital.

## 2020-07-18 NOTE — Progress Notes (Addendum)
Family Medicine Teaching Service Daily Progress Note Intern Pager: [phone]  Patient name: Jaime [name] Medical record number: [id number] Date of birth: 1995-12-01 Age: 24 y.o. Gender: male  Primary Care Provider: Macy Mis, MD Consultants: Neurology Code Status: Full  Pt Overview and Major Events to Date:  Patient admitted on 10/12  Assessment and Plan:  Patient is a 24 y.o. male presenting with LLE weakness d/t MS flare. PMH is significant for relapsing remitting MS, POTS, high-functioning autism, and depression.  Relapsing remitting multiple sclerosis, improving MRI brain demonstrated multiple new white matter lesions in bilateral cerebral hemispheres that were not seen on previous MRI brain in 8/21. Findings consistent with active demyelination. MR T spine demonstrated active demyelination at T4-T5. Neurology recommends IV methylprednisolone 1000 mg x5 days to treat acute flare. Patient started on IV steroids in ED. Currently on day 4. Patient will need to finish course of IV steroids prior to CIR admission. Of note, patients WBC 19.4 this morning. Likely 2/2 high dose steroids over the previous 3 days. - Continue IV methylprednisolone 1000 mg qd - Will continue to update mother regularly - Discharge to CIR when bed is available   Postural orthostatic tachycardia syndrome, stable Home medications include midrodrine 7.5 mg TID. BP has remained stable throughout admission. Most recent BP this morning 127/81. - Continue midrodrine 7.5 mg TID  Constipation Patient with intermittent constipation. Takes Miralax prn at home.  Will schedule Senna and Miralax.   GERD Home medications include omeprazole 40 mg qd. - Continue Protonix 40 mg qd  Insomnia Home medications include melatonin 6 mg prn qhs. - Schedule melatonin 6 mg qhs  Depression Patient has been prescribed Zoloft, but has not started yet. Will continue to hold for now. Can start as outpatient.  Autism spectrum  disorder Patient has high functioning autism. Will continue to update mother regularly.  Covid vaccination Patient has not received Covid vaccination and is interested in receiving one if possible this admission. - Will defer vaccination to outpatient   FEN/GI: Regular PPx: Lovenox  Disposition: CIR  Subjective:  No acute overnight events. Patient evaluated at the bedside. He endorses sleeping better overnight last night. He has not received his prn melatonin while in the hospital, and states that it works well for him at home. He still endorses some residual bilateral numbness in he LE. His strength is continuing to improve. He denies any concerns or questions at this time.  Objective: Temp:  [97.9 F (36.6 C)-99.1 F (37.3 C)] 98.4 F (36.9 C) (10/15 0601) Pulse Rate:  [57-72] 72 (10/15 0601) Resp:  [16-18] 16 (10/15 0601) BP: (114-128)/(63-83) 127/81 (10/15 0601) SpO2:  [97 %-99 %] 99 % (10/15 0601) Physical Exam: General: Patient is a pleasant young male, lying supine in bed, resting comfortably, in no apparent distress. Cardiovascular: Normal S1 and S2 with no murmurs, rubs, or gallops. RRR. Distal pulses 2+ and symmetrical. Respiratory: CTA in all fields with no wheezing, rales, or rhonchi. Chest rise is symmetrical with no increased labor of breathing. Abdominal: Soft, non distended, non tender to palpation. No hepatosplenomegaly appreciated. Bowel sounds normoactive. Skin: No active rashes or lesions on exam. Extremities: No peripheral edema or cyanosis.  Neurological: Alert and oriented x4. Normal mentation. MS 5/5 bilaterally in the UE and LE. Sensation to light touch intact bilaterally in the UE and LE. Tremor present in the RUE.   Laboratory: Recent Labs  Lab 07/15/20 0829 07/16/20 0733 07/18/20 0255  WBC 9.7 10.4 19.4*  HGB  14.6 15.5 13.9  HCT 44.0 45.2 40.5  PLT 232 258 282   Recent Labs  Lab 07/15/20 0829 07/16/20 0733 07/18/20 0255  NA 141 139  139  K 3.6 4.0 4.0  CL 105 105 105  CO2 26 22 25   BUN 17 15 14   CREATININE 0.82 0.88 0.84  CALCIUM 9.8 10.1 9.4  GLUCOSE 88 141* 149*     Imaging/Diagnostic Tests: No results found.   T, Medical Student 07/18/2020, 7:39 AM FPTS Intern pager: [phone], text pages welcome     RESIDENT ATTESTATION OF STUDENT NOTE   I personally evaluated this patient along with the student, and verified all aspects of the history, physical exam, and medical decision making as documented by the student. I agree with the student's documentation and have made all necessary edits.   07/20/2020, DO PGY-1, [facility] 07/18/2020 2:07 PM

## 2020-07-18 NOTE — Progress Notes (Signed)
Inpatient Rehab Admissions Coordinator:   I have a bed available tomorrow and will plan to transfer Pt to CIR once IV steroids are complete. Nurse may call [phone] for report on Saturday.   Megan Salon, MS, CCC-SLP Rehab Admissions Coordinator  [phone] (celll) [phone] (office)

## 2020-07-18 NOTE — Progress Notes (Addendum)
NEUROLOGY CONSULTATION PROGRESS NOTE   Date of service: July 18, 2020 Patient Name: Jaime [name] MRN:  [id number] DOB:  03-25-1996  Brief HPI  Jaime [name] is a 24 y.o. male with aggressive relapsing remitting MS, p/w L > R BL leg weakness and ataxia. Found to have numerous enhancing lesions on MRI B and a ? Enhancement in T spine. Findings concerning for MS flare up.   Interval Hx   Feeling in big toe is returning. He is happy that he is doing better.  Vitals   Vitals:   07/17/20 1737 07/18/20 0000 07/18/20 0601 07/18/20 1232  BP: 128/83 123/63 127/81 113/64  Pulse: (!) 57 (!) 57 72 89  Resp: 18 18 16    Temp: 99.1 F (37.3 C) 97.9 F (36.6 C) 98.4 F (36.9 C) 98.5 F (36.9 C)  TempSrc: Oral Oral Oral Oral  SpO2: 98% 97% 99% 96%  Weight:         Body mass index is 17.45 kg/m.  Physical Exam   General: Laying comfortably in bed; in no acute distress. HENT: Normal oropharynx and mucosa. Normal external appearance of ears and nose. Neck: Supple, no pain or tenderness CV: No JVD. No peripheral edema. Pulmonary: Symmetric Chest rise. Normal respiratory effort. Abdomen: Soft to touch, non-tender. Ext: No cyanosis, edema, or deformity Skin: No rash. Normal palpation of skin.  Musculoskeletal: Normal digits and nails by inspection. No clubbing.  Neurologic Examination  Mental status/Cognition: Alert, oriented to self, able to carry on a conversation. Speech/language: Fluent, comprehension intact. Cranial nerves:   CN II Pupils equal and round, no VF deficits   CN III,IV,VI EOM intact, no gaze preference or deviation, still has gaze evoked nystagmus with horizontal gaze that is direction changing.   CN V    CN VII no asymmetry, no nasolabial fold flattening   CN VIII normal hearing to speech   CN IX & X normal palatal elevation, no uvular deviation   CN XI    CN XII midline tongue protrusion   Motor:  Muscle bulk: normal, tone increased, pronator drift  none. Intention tremor with ataxia. Mvmt Root Nerve  Muscle Right Left Comments  SA C5/6 Ax Deltoid     EF C5/6 Mc Biceps     EE C6/7/8 Rad Triceps     WF C6/7 Med FCR 4+ 4+   WE C7/8 PIN ECU 4+ 4+   F Ab C8/T1 U ADM/FDI 5 5   HF L1/2/3 Fem Illopsoas 4+ 4+   KE L2/3/4 Fem Quad 4+ 4+   DF L4/5 D Peron Tib Ant 4+ 3   PF S1/2 Tibial Grc/Sol 4+ 4    Reflexes:  Right Left Comments  Pectoralis + +    Biceps (C5/6) 3 3   Brachioradialis (C5/6) 3 3    Triceps (C6/7) 3 3    Patellar (L3/4) 3 3    Achilles (S1) 4 4    Hoffman + +    Plantar up up   Jaw jerk    Sensation:  Light touch intact   Pin prick    Temperature    Vibration   Proprioception    Coordination/Complex Motor:  - Finger to Nose with ataxia and intention tremor. - Rapid alternating movement are slowed.  Labs   Basic Metabolic Panel:  Lab Results  Component Value Date   NA 139 07/18/2020   K 4.0 07/18/2020   CO2 25 07/18/2020   GLUCOSE 149 (H) 07/18/2020   BUN 14  07/18/2020   CREATININE 0.84 07/18/2020   CALCIUM 9.4 07/18/2020   GFRNONAA >60 07/18/2020   GFRAA >60 06/02/2020   HbA1c:  Lab Results  Component Value Date   HGBA1C 5.3 12/14/2019   LDL: No results found for: [facility] Urine Drug Screen: No results found for: LABOPIA, COCAINSCRNUR, LABBENZ, AMPHETMU, THCU, LABBARB  Alcohol Level No results found for: ETH No results found for: PHENYTOIN, ZONISAMIDE, LAMOTRIGINE, LEVETIRACETA No results found for: PHENYTOIN, PHENOBARB, VALPROATE, CBMZ  Imaging and Diagnostic studies   Reviewed labs from today, some mild glucose elevation probably 2/2 steroids. No significant abnormality on CBC or chemistry.  Impression   with aggressive relapsing remitting MS, p/w L > R BL leg weakness and ataxia. Found to have numerous enhancing lesions on MRI B and a ? Enhancement in T spine. Findings concerning for MS flare up.  Recommendations   - IV methylprednisone 1000mg  Q24 hours x 5 days with Protonix(ending  10/16). - PT and OT, he will be a good candidate for inpatient rehab. - Recommend AFO for left foot drop. ______________________________________________________________________   Thank you for the opportunity to take part in the care of this patient. If you have any further questions, please contact the neurology consultation attending.  Signed,  11/16 [facility] Pager Number Erick Blinks

## 2020-07-18 NOTE — Progress Notes (Signed)
Inpatient Rehab Admissions Coordinator:   I spoke with pt.'s mother and she is in agreement for Pt. To come tomorrow after he finishes IV steroids if I have a bed available.   Megan Salon, MS, CCC-SLP Rehab Admissions Coordinator  [phone] (celll) [phone] (office)

## 2020-07-18 NOTE — Progress Notes (Signed)
Occupational Therapy Treatment Patient Details Name: Jaime [name] MRN: [id number] DOB: 1996-01-01 Today's Date: 07/18/2020    History of present illness 24 y.o. male presenting with left leg weakness due to multiple sclerosis flare. PMH is significant for relapsing remitting MS, POTS, autism (high-functioning), and depression.   OT comments  Patient supine in bed and agreeable to OT session.  Completing bed mobility with supervision, transfers/in room mobility with min assist using RW and simulated grooming tasks at sink with min assist.  He remains limited by UE tremors, weakness, impaired balance and decreased activity tolerance. He relies on BUE support in standing and reports "feeling uneasy" when asked to complete ADL task in standing, leaning forwards at sink to support body briefly.  He is progressing towards goals and is highly motivated to return to PLOF. Will follow and continue to recommend CIR.    Follow Up Recommendations  CIR    Equipment Recommendations  Other (comment) (TBD at next venue of care )    Recommendations for Other Services      Precautions / Restrictions Precautions Precautions: Fall Restrictions Weight Bearing Restrictions: No       Mobility Bed Mobility Overal bed mobility: Needs Assistance Bed Mobility: Supine to Sit     Supine to sit: Supervision;HOB elevated Sit to supine: Supervision   General bed mobility comments: HOB elevated, increased time but no physical assist required  Transfers Overall transfer level: Needs assistance Equipment used: Rolling walker (2 wheeled) Transfers: Sit to/from Stand Sit to Stand: Min assist         General transfer comment: min assist to power up and steady, cueing for hand placement/safety with RW     Balance Overall balance assessment: Needs assistance Sitting-balance support: No upper extremity supported;Feet supported Sitting balance-Leahy Scale: Fair Sitting balance - Comments: min guard  to close supervision for safety  Postural control: Posterior lean Standing balance support: Bilateral upper extremity supported;During functional activity (or trunk support leaning on sink) Standing balance-Leahy Scale: Poor Standing balance comment: relies on BUE and external support                           ADL either performed or assessed with clinical judgement   ADL Overall ADL's : Needs assistance/impaired   Eating/Feeding Details (indicate cue type and reason): reports using L UE more than R due to tremors--would benefit from weighted utensils  Grooming: Minimal assistance;Standing Grooming Details (indicate cue type and reason): to engage in standing tasks at sink, declined grooming tasks but simulated requring min assist for balance (pt even leaning against sink)              Lower Body Dressing: Moderate assistance;Sit to/from stand Lower Body Dressing Details (indicate cue type and reason): able to adjust socks, relies on BUE support standing  Toilet Transfer: Minimal assistance;Ambulation;RW Toilet Transfer Details (indicate cue type and reason): simulated to recliner          Functional mobility during ADLs: Minimal assistance;Rolling walker;Cueing for safety;Cueing for sequencing       Vision       Perception     Praxis      Cognition Arousal/Alertness: Awake/alert Behavior During Therapy: Flat affect Overall Cognitive Status: History of cognitive impairments - at baseline                                 General Comments:  pt with high functioning autism, follows commands well and good awarness of situation but requires increased time for problem sovling         Exercises Other Exercises Other Exercises: mini squats at chair; 10x; no UE support; close min guard for safety.   Shoulder Instructions       General Comments      Pertinent Vitals/ Pain       Pain Assessment: No/denies pain Faces Pain Scale: No hurt  Home  Living                                          Prior Functioning/Environment              Frequency  Min 2X/week        Progress Toward Goals  OT Goals(current goals can now be found in the care plan section)  Progress towards OT goals: Progressing toward goals  Acute Rehab OT Goals Patient Stated Goal: to get better OT Goal Formulation: With patient  Plan Discharge plan remains appropriate;Frequency remains appropriate    Co-evaluation                 AM-PAC OT "6 Clicks" Daily Activity     Outcome Measure   Help from another person eating meals?: A Little Help from another person taking care of personal grooming?: A Little Help from another person toileting, which includes using toliet, bedpan, or urinal?: A Lot Help from another person bathing (including washing, rinsing, drying)?: A Little Help from another person to put on and taking off regular upper body clothing?: A Little Help from another person to put on and taking off regular lower body clothing?: A Little 6 Click Score: 17    End of Session Equipment Utilized During Treatment: Gait belt;Rolling walker  OT Visit Diagnosis: Unsteadiness on feet (R26.81);Other abnormalities of gait and mobility (R26.89);History of falling (Z91.81);Other symptoms and signs involving the nervous system (R29.898)   Activity Tolerance Patient tolerated treatment well   Patient Left in chair;with call bell/phone within reach;with chair alarm set   Nurse Communication Mobility status        Time: 3329-5188 OT Time Calculation (min): 14 min  Charges: OT General Charges $OT Visit: 1 Visit OT Treatments $Self Care/Home Management : 8-22 mins  Jaime [name], OT Acute Rehabilitation Services Pager [phone] Office [phone]    Jaime [name] 07/18/2020, 12:31 PM

## 2020-07-18 NOTE — Progress Notes (Signed)
Physical Therapy Treatment Patient Details Name: Jaime [name] MRN: [id number] DOB: Nov 20, 1995 Today's Date: 07/18/2020    History of Present Illness 24 y.o. male presenting with left leg weakness due to multiple sclerosis flare. PMH is significant for relapsing remitting MS, POTS, autism (high-functioning), and depression.    PT Comments    This PTA has worked with pt during previous hospital admission. Pt's gait quality and coordination has significantly declined from previous admission, limiting pt's distance and safety with gait. Noted increased tremulous movement throughout session, interfering with pt's ability to don socks. Pt was able to ambulate short distance in room and perform mini squats. He reports feeling very tired and returned to supine in bed. Feel current plan remains appropriate. Will continue to follow acutely.    Follow Up Recommendations  CIR     Equipment Recommendations       Recommendations for Other Services Rehab consult     Precautions / Restrictions Precautions Precautions: Fall Restrictions Weight Bearing Restrictions: No    Mobility  Bed Mobility Overal bed mobility: Needs Assistance Bed Mobility: Supine to Sit;Sit to Supine     Supine to sit: HOB elevated;Supervision Sit to supine: Supervision   General bed mobility comments: HOB elevated for bed mobilities. Pt able to progress into and OOB with supervision for safety. Increased time and effort with tremulous movement.  Transfers Overall transfer level: Needs assistance Equipment used: Rolling walker (2 wheeled) Transfers: Sit to/from Stand Sit to Stand: Min guard         General transfer comment: min guard for safety with cues for safe hand placement using RW.  Ambulation/Gait Ambulation/Gait assistance: Min assist Gait Distance (Feet): 10 Feet Assistive device: Rolling walker (2 wheeled) Gait Pattern/deviations: Ataxic;Trunk flexed (exaggerated hip and knee flexion) Gait  velocity: reduced Gait velocity interpretation: <1.31 ft/sec, indicative of household ambulator General Gait Details: Pt gait appears uncoordinated with exaggerated hip and knee flexion. Mildly unsteady with min A to correct. Cues for postural control and drawing attention to pt's gait deviations.   Stairs             Wheelchair Mobility    Modified Rankin (Stroke Patients Only)       Balance Overall balance assessment: Needs assistance Sitting-balance support: No upper extremity supported;Feet supported Sitting balance-Leahy Scale: Fair Sitting balance - Comments: tremulous, required min A for balance while donning socks. Postural control: Posterior lean Standing balance support: Bilateral upper extremity supported Standing balance-Leahy Scale: Poor Standing balance comment: reliant on B UE support for ambulation. Able to statically stand w/o support, though appears unsteady.                            Cognition Arousal/Alertness: Awake/alert Behavior During Therapy: Flat affect Overall Cognitive Status: History of cognitive impairments - at baseline                                 General Comments: slowed processing at times, pt with history of high functioning autism. Pt has good awareness of situation and follows commands well; however if asked a two part question he will will not answer both parts.       Exercises      General Comments        Pertinent Vitals/Pain Pain Assessment: No/denies pain Faces Pain Scale: No hurt    Home Living  Prior Function            PT Goals (current goals can now be found in the care plan section) Acute Rehab PT Goals Patient Stated Goal: agreeable to CIR PT Goal Formulation: With patient Time For Goal Achievement: 07/30/20 Potential to Achieve Goals: Good Progress towards PT goals: Progressing toward goals    Frequency    Min 3X/week      PT Plan Current  plan remains appropriate    Co-evaluation              AM-PAC PT "6 Clicks" Mobility   Outcome Measure  Help needed turning from your back to your side while in a flat bed without using bedrails?: None Help needed moving from lying on your back to sitting on the side of a flat bed without using bedrails?: None Help needed moving to and from a bed to a chair (including a wheelchair)?: A Little Help needed standing up from a chair using your arms (e.g., wheelchair or bedside chair)?: A Little Help needed to walk in hospital room?: A Little Help needed climbing 3-5 steps with a railing? : A Lot 6 Click Score: 19    End of Session Equipment Utilized During Treatment: Gait belt Activity Tolerance: Patient tolerated treatment well Patient left: in bed;with call bell/phone within reach;with bed alarm set Nurse Communication: Mobility status PT Visit Diagnosis: Unsteadiness on feet (R26.81);Muscle weakness (generalized) (M62.81);Other symptoms and signs involving the nervous system (R29.898);Other abnormalities of gait and mobility (R26.89)     Time: 4827-0786 PT Time Calculation (min) (ACUTE ONLY): 19 min  Charges:  $Neuromuscular Re-education: 8-22 mins                     Kallie Locks, [place] Pager [phone] Acute Rehab   Sheral Apley 07/18/2020, 10:41 AM

## 2020-07-19 ENCOUNTER — Inpatient Hospital Stay (HOSPITAL_COMMUNITY): Payer: Medicaid Other

## 2020-07-19 ENCOUNTER — Inpatient Hospital Stay (HOSPITAL_COMMUNITY): Payer: Medicaid Other | Admitting: Occupational Therapy

## 2020-07-19 ENCOUNTER — Inpatient Hospital Stay (HOSPITAL_COMMUNITY)
Admission: RE | Admit: 2020-07-19 | Discharge: 2020-07-26 | DRG: 059 | Disposition: A | Discharge status: Home Health | Payer: Medicaid Other | Source: Intra-hospital | Attending: Physical Medicine and Rehabilitation | Admitting: Physical Medicine and Rehabilitation

## 2020-07-19 ENCOUNTER — Other Ambulatory Visit: Payer: Self-pay

## 2020-07-19 DIAGNOSIS — R066 Hiccough: Secondary | ICD-10-CM | POA: Diagnosis present

## 2020-07-19 DIAGNOSIS — F840 Autistic disorder: Secondary | ICD-10-CM | POA: Diagnosis present

## 2020-07-19 DIAGNOSIS — Y92239 Unspecified place in hospital as the place of occurrence of the external cause: Secondary | ICD-10-CM | POA: Diagnosis not present

## 2020-07-19 DIAGNOSIS — Z832 Family history of diseases of the blood and blood-forming organs and certain disorders involving the immune mechanism: Secondary | ICD-10-CM | POA: Diagnosis not present

## 2020-07-19 DIAGNOSIS — K5900 Constipation, unspecified: Secondary | ICD-10-CM | POA: Diagnosis present

## 2020-07-19 DIAGNOSIS — F32A Depression, unspecified: Secondary | ICD-10-CM | POA: Diagnosis present

## 2020-07-19 DIAGNOSIS — I10 Essential (primary) hypertension: Secondary | ICD-10-CM | POA: Diagnosis present

## 2020-07-19 DIAGNOSIS — Z79899 Other long term (current) drug therapy: Secondary | ICD-10-CM

## 2020-07-19 DIAGNOSIS — G252 Other specified forms of tremor: Secondary | ICD-10-CM | POA: Diagnosis present

## 2020-07-19 DIAGNOSIS — D72829 Elevated white blood cell count, unspecified: Secondary | ICD-10-CM | POA: Diagnosis not present

## 2020-07-19 DIAGNOSIS — D72828 Other elevated white blood cell count: Secondary | ICD-10-CM | POA: Diagnosis not present

## 2020-07-19 DIAGNOSIS — R270 Ataxia, unspecified: Secondary | ICD-10-CM | POA: Diagnosis present

## 2020-07-19 DIAGNOSIS — E876 Hypokalemia: Secondary | ICD-10-CM

## 2020-07-19 DIAGNOSIS — G35 Multiple sclerosis: Principal | ICD-10-CM | POA: Diagnosis present

## 2020-07-19 DIAGNOSIS — T380X5A Adverse effect of glucocorticoids and synthetic analogues, initial encounter: Secondary | ICD-10-CM | POA: Diagnosis not present

## 2020-07-19 DIAGNOSIS — R159 Full incontinence of feces: Secondary | ICD-10-CM | POA: Diagnosis not present

## 2020-07-19 DIAGNOSIS — I951 Orthostatic hypotension: Secondary | ICD-10-CM | POA: Diagnosis present

## 2020-07-19 LAB — CREATININE, SERUM
Creatinine, Ser: 0.95 mg/dL (ref 0.61–1.24)
GFR, Estimated: >60 mL/min (ref >60)

## 2020-07-19 LAB — CBC
HCT: 41.4 % (ref 39.0–52.0)
Hemoglobin: 14.3 g/dL (ref 13.0–17.0)
MCH: 31.4 pg (ref 26.0–34.0)
MCHC: 34.5 g/dL (ref 30.0–36.0)
MCV: 90.8 fL (ref 80.0–100.0)
Platelets: 305 10*3/uL (ref 150–400)
RBC: 4.56 MIL/uL (ref 4.22–5.81)
RDW: 13.5 % (ref 11.5–15.5)
WBC: 18 10*3/uL — ABNORMAL HIGH (ref 4.0–10.5)
nRBC: 0 % (ref 0.0–0.2)

## 2020-07-19 MED ORDER — MIDODRINE HCL 5 MG PO TABS
7.5000 mg | ORAL_TABLET | Freq: Three times a day (TID) | ORAL | Status: DC
  Administered 2020-07-19 – 2020-07-20 (×2): 7.5 mg via ORAL
  Filled 2020-07-19 (×2): qty 2

## 2020-07-19 MED ORDER — PANTOPRAZOLE SODIUM 40 MG PO TBEC
40.0000 mg | DELAYED_RELEASE_TABLET | Freq: Every day | ORAL | Status: DC
  Administered 2020-07-21 – 2020-07-26 (×6): 40 mg via ORAL
  Filled 2020-07-19 (×7): qty 1

## 2020-07-19 MED ORDER — ENOXAPARIN SODIUM 40 MG/0.4ML ~~LOC~~ SOLN: 40.0000 mg | SUBCUTANEOUS | Status: DC

## 2020-07-19 MED ORDER — ENOXAPARIN SODIUM 40 MG/0.4ML ~~LOC~~ SOLN
40.0000 mg | SUBCUTANEOUS | Status: DC
  Administered 2020-07-19 – 2020-07-25 (×7): 40 mg via SUBCUTANEOUS
  Filled 2020-07-19 (×7): qty 0.4

## 2020-07-19 NOTE — Plan of Care (Signed)
Mother called, will be here shortly to provide pt a shower as was not given one before being transferred. Informed of process for evaluation. This nurse was advised that she will provide shower to her son. Will continue with plan of care.

## 2020-07-19 NOTE — H&P (Signed)
Physical Medicine and Rehabilitation Admission H&P       HPI: Jaime [name] is a 24 year old right-handed male history of mild autism, hypertension recently diagnosed multiple sclerosis March 2021 followed by neurology services Dr. Epimenio Foot.  Patient well-known to rehab services from multiple sclerosis exacerbations 12/17/2019 to 12/26/2019 as well as 05/24/2020 to 06/05/2020.  He was discharged home ambulating contact-guard assist.  He lives with his mother and 69 year old and 80 year old siblings.  Two-level home bed and bath upstairs.  Presented 07/15/2020 with relapsing multiple sclerosis weakness left greater than right.  MRI of the brain showed numerous subcentimeter enhancing lesions within the juxtacortical subcortical white matter of the bilateral cerebral hemispheres.  Placed on intravenous Solu-Medrol x5 doses.  Subcutaneous Lovenox for DVT prophylaxis.  ProAmatine ongoing for history of orthostasis.  Tolerating a regular diet.  Therapy evaluations completed and patient was admitted for a comprehensive rehab program.   Review of Systems  Constitutional: Negative for chills and fever.  HENT: Negative for hearing loss.   Eyes: Positive for blurred vision.  Respiratory: Negative for cough and shortness of breath.   Cardiovascular: Positive for leg swelling.  Gastrointestinal: Positive for constipation. Negative for heartburn and vomiting.  Genitourinary: Negative for dysuria, flank pain and hematuria.  Musculoskeletal: Positive for myalgias.  Skin: Negative for rash.  Neurological: Positive for tremors, sensory change and weakness.  Psychiatric/Behavioral: Positive for depression.       Mild autism  All other systems reviewed and are negative.       Past Medical History:  Diagnosis Date  . Autism    . Depression    . MS (multiple sclerosis) (HCC)           Past Surgical History:  Procedure Laterality Date  . NO PAST SURGERIES             Family History  Problem Relation Age of  Onset  . Clotting disorder Mother      Social History:  reports that he has never smoked. He has never used smokeless tobacco. He reports that he does not drink alcohol and does not use drugs. Allergies: No Known Allergies       Medications Prior to Admission  Medication Sig Dispense Refill  . midodrine (PROAMATINE) 2.5 MG tablet Take 3 tablets (7.5 mg total) by mouth 3 (three) times daily with meals. 90 tablet 0  . pantoprazole (PROTONIX) 40 MG tablet Take 1 tablet (40 mg total) by mouth daily. 30 tablet 0  . acetaminophen (TYLENOL) 325 MG tablet Take 2 tablets (650 mg total) by mouth every 4 (four) hours as needed for mild pain or fever (or Fever >/= 101). (Patient not taking: Reported on 07/15/2020)      . melatonin 3 MG TABS tablet Take 2 tablets (6 mg total) by mouth at bedtime. (Patient not taking: Reported on 06/19/2020) 30 tablet 0  . senna (SENOKOT) 8.6 MG TABS tablet Take 2 tablets (17.2 mg total) by mouth 2 (two) times daily at 10 AM and 5 PM. (Patient not taking: Reported on 07/15/2020) 120 tablet 0  . sertraline (ZOLOFT) 50 MG tablet Take 1 tablet (50 mg total) by mouth daily. (Patient not taking: Reported on 07/15/2020) 30 tablet 11      Drug Regimen Review Drug regimen was reviewed and remains appropriate with no significant issues identified   Home: Home Living Family/patient expects to be discharged to:: Private residence Living Arrangements: Parent, Other relatives Available Help at Discharge: Family, Available 24 hours/day Type of Home:  Apartment Home Access: Stairs to enter Entrance Stairs-Number of Steps: 2 without rail at back entrance, 4 wiht bil rail at front entrance, per pt Entrance Stairs-Rails: Can reach both Home Layout: Two level, Bed/bath upstairs Alternate Level Stairs-Number of Steps: full flight Alternate Level Stairs-Rails: Right Bathroom Shower/Tub: Engineer, manufacturing systems: Standard Bathroom Accessibility: Yes Home Equipment: Walker - 2  wheels   Functional History: Prior Function Level of Independence: Needs assistance Gait / Transfers Assistance Needed: pt reports he has been ambulating with use of a walker recently. Fell on 10/8 attempting to get to the bathroom. Pt's mother has been assisting with stairs and ADLs   Functional Status:  Mobility: Bed Mobility Overal bed mobility: Needs Assistance Bed Mobility: Supine to Sit, Sit to Supine Supine to sit: HOB elevated, Supervision Sit to supine: Min assist General bed mobility comments: min A to position trunk back into bed Transfers Overall transfer level: Needs assistance Equipment used: Rolling walker (2 wheeled) Transfers: Sit to/from Stand Sit to Stand: Mod assist General transfer comment: mod A to power up to standing and stabilize, cues for hand placement and problem solve getting walker around obstacle in bathroom Ambulation/Gait Ambulation/Gait assistance: Min assist Gait Distance (Feet): 10 Feet Assistive device: Rolling walker (2 wheeled) Gait Pattern/deviations: Step-to pattern General Gait Details: pt with short step to gait, tremors noted during ambulation Gait velocity: reduced Gait velocity interpretation: <1.31 ft/sec, indicative of household ambulator   ADL: ADL Overall ADL's : Needs assistance/impaired Grooming: Minimal assistance, Sitting Upper Body Bathing: Minimal assistance, Sitting Lower Body Bathing: Moderate assistance, Sit to/from stand, Sitting/lateral leans Upper Body Dressing : Minimal assistance, Sitting Lower Body Dressing: Moderate assistance, Sitting/lateral leans Lower Body Dressing Details (indicate cue type and reason): patient able to doff sock with min A for sitting balance and unable to don  Toilet Transfer: Moderate assistance, Ambulation, Cueing for safety, Cueing for sequencing, Grab bars, RW (bedside commode over toilet ) Toilet Transfer Details (indicate cue type and reason): mod A to power up to standing and  stabilize, cues to reach back for commode arms with sitting for eccentric control  Toileting- Clothing Manipulation and Hygiene: Maximal assistance, Sit to/from stand Toileting - Clothing Manipulation Details (indicate cue type and reason): to manage underwear due to decreased standing balance Functional mobility during ADLs: Moderate assistance, Cueing for safety, Cueing for sequencing, Rolling walker General ADL Comments: patient requiring increased assistance for self care due to decreased balance, activity tolerance, safety awareness   Cognition: Cognition Overall Cognitive Status: History of cognitive impairments - at baseline Orientation Level: Oriented X4 Cognition Arousal/Alertness: Awake/alert Behavior During Therapy: Flat affect Overall Cognitive Status: History of cognitive impairments - at baseline General Comments: slowed processing at times, pt with history of high functioning autism. Pt has good awareness of situation and follows commands well.   Physical Exam: Blood pressure 115/76, pulse 71, temperature 98.4 F (36.9 C), temperature source Oral, resp. rate 18, weight 53.6 kg, SpO2 96 %. Physical Exam Neurological:     Comments: Patient is alert.  Mood is a bit flat but appropriate.  He makes eye contact with examiner.  Provides his name and age.  He does recall his latest stay on inpatient rehab services.     General: No acute distress Mood and affect are appropriate Heart: Regular rate and rhythm no rubs murmurs or extra sounds Lungs: Clear to auscultation, breathing unlabored, no rales or wheezes Abdomen: Positive bowel sounds, soft nontender to palpation, nondistended Extremities: No clubbing, cyanosis, or edema  Skin: No evidence of breakdown, no evidence of rash Neurologic: Cranial nerves II through XII intact, motor strength is 5/5 in bilateral deltoid, bicep, tricep, grip, hip flexor, knee extensors, ankle dorsiflexor and plantar flexor Sensory exam normal  sensation to light touch in bilateral upper and lower extremities Cerebellar exam ataxiafinger to nose to finger as well as heel to shin in bilateral upper and lower extremities +intention tremor Musculoskeletal: Full range of motion in all 4 extremities. No joint swelling    Lab Results Last 48 Hours        Results for orders placed or performed during the hospital encounter of 07/15/20 (from the past 48 hour(s))  Basic metabolic panel     Status: Abnormal    Collection Time: 07/16/20  7:33 AM  Result Value Ref Range    Sodium 139 135 - 145 mmol/L    Potassium 4.0 3.5 - 5.1 mmol/L    Chloride 105 98 - 111 mmol/L    CO2 22 22 - 32 mmol/L    Glucose, Bld 141 (H) 70 - 99 mg/dL      Comment: Glucose reference range applies only to samples taken after fasting for at least 8 hours.    BUN 15 6 - 20 mg/dL    Creatinine, Ser 3.87 0.61 - 1.24 mg/dL    Calcium 56.4 8.9 - 33.2 mg/dL    GFR, Estimated >95 >18 mL/min    Anion gap 12 5 - 15      Comment: Performed at [facility], [address], [place], [place] [address]  CBC     Status: None    Collection Time: 07/16/20  7:33 AM  Result Value Ref Range    WBC 10.4 4.0 - 10.5 K/uL    RBC 5.00 4.22 - 5.81 MIL/uL    Hemoglobin 15.5 13.0 - 17.0 g/dL    HCT 06.3 39 - 52 %    MCV 90.4 80.0 - 100.0 fL    MCH 31.0 26.0 - 34.0 pg    MCHC 34.3 30.0 - 36.0 g/dL    RDW 01.6 01.0 - 93.2 %    Platelets 258 150 - 400 K/uL    nRBC 0.0 0.0 - 0.2 %      Comment: Performed at [facility], [address], [place], [place] [address]       Imaging Results (Last 48 hours)  MR Brain W and Wo Contrast   Result Date: 07/15/2020 CLINICAL DATA:  Multiple sclerosis, new event; new left leg weakness. EXAM: MRI HEAD WITHOUT AND WITH CONTRAST TECHNIQUE: Multiplanar, multiecho pulse sequences of the brain and surrounding structures were obtained without and with intravenous contrast. CONTRAST:  5.80mL GADAVIST GADOBUTROL 1 MMOL/ML IV SOLN COMPARISON:   Brain MRI 05/19/2020. FINDINGS: Brain: Mild intermittent motion degradation. This includes mild motion degradation of the axial T1 weighted postcontrast sequence. There are numerous subcentimeter enhancing lesions within the juxtacortical and subcortical white matter of the bilateral cerebral hemispheres (at least 15-20 in number) (see annotations on series 32, image 34). The majority of, if not all of, these enhancing lesions appear new as compared to the prior examination of 05/19/2020. As before, there is background severe multifocal T2/FLAIR hyperintense signal abnormality within the subcortical/juxtacortical and deep cerebral white matter, corpus callosum, thalami, brainstem, cerebellum and middle cerebellar peduncles. No evidence of intracranial mass. No chronic intracranial blood products. No extra-axial fluid collection. No midline shift. Vascular: Expected proximal arterial flow voids. Skull and upper cervical spine: No  focal marrow lesion. Sinuses/Orbits: Visualized orbits show no acute finding. Trace scattered paranasal sinus mucosal thickening. No significant mastoid effusion. IMPRESSION: Mildly motion degraded examination. Numerous (numbering at least 15-20) subcentimeter enhancing lesions within the juxtacortical and subcortical white matter of the bilateral cerebral hemispheres. The majority of, if not all of, these enhancing lesions appear new as compared to the MRI of 05/19/2020 and are consistent with sites of active demyelination. As before, there is background severe multifocal T2 hyperintense signal abnormality within the cerebral white matter, corpus callosum, thalami, brainstem, cerebellum and middle cerebellar peduncles. These findings are consistent with the provided history of multiple sclerosis. Electronically Signed   By: Jackey Loge DO   On: 07/15/2020 15:09    MR Cervical Spine W or Wo Contrast   Result Date: 07/15/2020 CLINICAL DATA:  Multiple sclerosis, new event. Additional  provided: Weakness, difficulty walking. EXAM: MRI CERVICAL SPINE WITHOUT AND WITH CONTRAST TECHNIQUE: Multiplanar and multiecho pulse sequences of the cervical spine, to include the craniocervical junction and cervicothoracic junction, were obtained without and with intravenous contrast. CONTRAST:  5.39mL GADAVIST GADOBUTROL 1 MMOL/ML IV SOLN COMPARISON:  Cervical spine MRI 12/11/2019. FINDINGS: The examination is intermittently motion degraded, limiting evaluation. Most notably, there is moderate motion degradation of the axial T2 GRE sequence, moderate motion degradation of the axial T2 weighted sequence and moderate/severe motion degradation of the postcontrast axial T1 weighted sequence. Alignment: Straightening of the expected cervical lordosis. No significant spondylolisthesis. Vertebrae: Vertebral body height is maintained. No focal suspicious osseous lesion or significant marrow edema. Cord: Similar to the prior MRI of 12/11/2019, there is extensive multifocal T2/STIR hyperintense signal abnormality throughout the majority of the cervical spinal cord. Within the limitations of motion degradation, no abnormal cord enhancement is identified to suggest active demyelination. There is associated mild diffuse atrophy of the cervical spinal cord. Posterior Fossa, vertebral arteries, paraspinal tissues: Posterior fossa better assessed on the concurrently performed brain MRI. Flow voids preserved within the imaged cervical vertebral arteries. Paraspinal soft tissues within normal limits. Disc levels: Intervertebral disc height is maintained. No significant disc herniation, spinal canal stenosis or neural foraminal narrowing at any level. IMPRESSION: Motion degraded examination as described and limiting evaluation. Similar to the prior MRI of 12/11/2019, there is extensive multifocal T2 hyperintense signal abnormality affecting the majority of the cervical spinal cord. There is associated mild diffuse cord atrophy and  these findings are consistent with the provided history of multiple sclerosis. Within the limitations of motion degradation, no abnormal enhancement of the cervical spinal cord is identified to suggest active demyelination. Electronically Signed   By: Jackey Loge DO   On: 07/15/2020 15:19    MR THORACIC SPINE W WO CONTRAST   Result Date: 07/15/2020 CLINICAL DATA:  Demyelinating disease; MS flare suspected, new left leg weakness. EXAM: MRI THORACIC WITHOUT AND WITH CONTRAST TECHNIQUE: Multiplanar and multiecho pulse sequences of the thoracic spine were obtained without and with intravenous contrast. CONTRAST:  5.48mL GADAVIST GADOBUTROL 1 MMOL/ML IV SOLN COMPARISON:  Thoracic spine MRI 12/16/2019. FINDINGS: MRI THORACIC SPINE FINDINGS Alignment:  No significant spondylolisthesis. Vertebrae: Vertebral body height is maintained. No suspicious osseous lesion or significant marrow edema. Cord: Again demonstrated is extensive multifocal T2/STIR hyperintense signal abnormality throughout the majority of the thoracic spinal cord. There are several lesions which are new or significantly progressed as compared to the prior examination of 12/16/2019 as follows. A lesion within the central cord at the T4 level (series 23, image 10). A lesion within the right  dorsal lateral aspect of the cord at the T5 level (series 23, image 13). A lesion within the right aspect of the spinal cord at the T6 level (series 23, image 16). A lesion within the central cord at the T7-T8 level (series 23, image 19). A lesion within the central cord at the T11 level (series 23, image 30). Faint contrast enhancement is questioned at the sites of the T4 and T5 level lesions and this reflect active demyelination (series 26, image 11). No definite abnormal enhancement is identified elsewhere within the thoracic cord. Paraspinal and other soft tissues: No abnormality identified within included portions of the thorax or upper abdomen. Disc levels: At  T4-T5, there is unchanged tiny central disc protrusion which partially effaces the ventral thecal sac and contacts the ventral spinal cord. No significant central canal stenosis. At T6-T7, there is an unchanged small central disc protrusion which partially effaces the ventral thecal sac and contacts the ventral spinal cord. No significant spinal canal stenosis. No significant foraminal narrowing at any level. IMPRESSION: Extensive multifocal T2 hyperintense signal abnormality affecting the majority of the thoracic spinal cord. There are T2 hyperintense lesions at the T4, T5, T6, T7-T8 and T11 levels which are new or significantly progressed as compared to the MRI of 12/16/2019. These findings are consistent with the provided history of demyelinating disease/multiple sclerosis. There is questionable enhancement corresponding with the T4 and T5 level lesions and this may reflect active demyelination. Electronically Signed   By: Jackey Loge DO   On: 07/15/2020 15:38             Medical Problem List and Plan: 1.  Decreased functional ability secondary to multiple sclerosis exacerbation.  IV Solu-Medrol x5 completed.  Follow-up outpatient Dr. Epimenio Foot             -patient may shower             -ELOS/Goals: 5-7 2.  Antithrombotics: -DVT/anticoagulation: Subcutaneous Lovenox             -antiplatelet therapy: N/A 3. Pain Management: Tylenol as needed 4. Mood/mild autism: Patient is cognitively at baseline.  Melatonin 6 mg nightly as needed             -antipsychotic agents: N/A 5. Neuropsych: This patient is capable of making decisions on his own behalf. 6. Skin/Wound Care: Routine skin checks 7. Fluids/Electrolytes/Nutrition: Routine in and outs with follow-up chemistries 8.  Orthostasis.  ProAmatine 7.5 mg 3 times daily.  Monitor with increased mobility 9.  Constipation.  MiraLAX as needed    Charlton Amor, PA-C 07/17/2020 "I have personally performed a face to face diagnostic evaluation of  this patient.  Additionally, I have reviewed and concur with the physician assistant's documentation above." Erick Colace M.D. [facility] FAAPM&R (Neuromuscular Med) Diplomate Am Board of Electrodiagnostic Med Fellow Am Board of Interventional Pain

## 2020-07-19 NOTE — Progress Notes (Signed)
Family Medicine Teaching Service Daily Progress Note Intern Pager: [phone]  Patient name: Jaime [name] Medical record number: [id number] Date of birth: [birth year] Age: 24 y.o. Gender: male  Primary Care Provider: Macy Mis, MD Consultants: Neurology  Code Status: Full  Pt Overview and Major Events to Date:  Admitted on 10/12  Assessment and Plan: Patient is a 24 y.o. male presenting with LLE weakness d/t MS flare. PMH is significant for relapsing remitting MS, POTS, high-functioning autism, and depression.  Relapsing remitting multiple sclerosis Improving. MRI brain demonstrated multiple new white matter lesions in bilateral cerebral hemispheres that were not seen on previous MRI brain in 8/21. Findings consistent with active demyelination. MR T spine demonstrated active demyelination at T4-T5. - Per neuro recs, continue IV methylprednisolone 1000 mg qd, day 5 -consider f/u neurology outpatient for continued management  Postural orthostatic tachycardia syndrome Stable with stable BP. Home medications include midrodrine 7.5 mg TID.  - Continue midrodrine 7.5 mg TID  Constipation Patient with intermittent constipation.  -senna 8.6 mg daily -miralax 17 g daily  GERD Home medications include omeprazole 40 mg qd. - Continue Protonix 40 mg qd  Insomnia Home medications include melatonin 6 mg prn qhs. - Schedule melatonin 6 mg qhs  Depression Patient has been prescribed Zoloft, but has not started yet.  -F/u with PCP to start zoloft, hold at this time   Autism spectrum disorder Patient has high functioning autism.   Covid vaccination Patient has not received Covid vaccination and is interested in receiving one if possible this admission. - Will defer vaccination to outpatient   FEN/GI: regular diet  PPx: Lovenox    Status is: Inpatient   Disposition: CIR      Subjective:  No overnight events to report. Denies dyspnea and chest pain.    Objective: Temp:  [98.4 F (36.9 C)-98.8 F (37.1 C)] 98.4 F (36.9 C) (10/16 0037) Pulse Rate:  [50-89] 50 (10/16 0037) Resp:  [16-18] 18 (10/16 0037) BP: (108-127)/(64-81) 111/70 (10/16 0037) SpO2:  [82 %-99 %] 95 % (10/16 0037) Physical Exam: General: Patient laying comfortably in bed, in no acute distress. Cardiovascular: RRR, no murmurs or gallops  Respiratory: lungs clear to auscultation  Abdomen: soft, nontender, presence of active bowel sounds  Neuro: AOx4 Extremities: radial pulses intact bilaterally   Laboratory: Recent Labs  Lab 07/15/20 0829 07/16/20 0733 07/18/20 0255  WBC 9.7 10.4 19.4*  HGB 14.6 15.5 13.9  HCT 44.0 45.2 40.5  PLT 232 258 282   Recent Labs  Lab 07/15/20 0829 07/16/20 0733 07/18/20 0255  NA 141 139 139  K 3.6 4.0 4.0  CL 105 105 105  CO2 26 22 25   BUN 17 15 14   CREATININE 0.82 0.88 0.84  CALCIUM 9.8 10.1 9.4  GLUCOSE 88 141* 149*      Imaging/Diagnostic Tests: No results found.  , DO 07/19/2020, 3:40 AM PGY-1, [facility] FPTS Intern pager: [phone], text pages welcome

## 2020-07-19 NOTE — Progress Notes (Signed)
Pt arrived to unit, alert and oriented, able to make needs known, oriented to rehab, pt has personal belongings to include, doll, 1 electronic game device with one hand control, glasses, cell phone and some clothes

## 2020-07-20 ENCOUNTER — Inpatient Hospital Stay (HOSPITAL_COMMUNITY): Payer: Medicaid Other

## 2020-07-20 ENCOUNTER — Inpatient Hospital Stay (HOSPITAL_COMMUNITY): Payer: Medicaid Other | Admitting: Occupational Therapy

## 2020-07-20 DIAGNOSIS — G35 Multiple sclerosis: Secondary | ICD-10-CM | POA: Diagnosis not present

## 2020-07-20 MED ORDER — MIDODRINE HCL 5 MG PO TABS
5.0000 mg | ORAL_TABLET | Freq: Two times a day (BID) | ORAL | Status: DC
  Administered 2020-07-21 – 2020-07-22 (×3): 5 mg via ORAL
  Filled 2020-07-20 (×3): qty 1

## 2020-07-20 MED ORDER — BACLOFEN 5 MG HALF TABLET
5.0000 mg | ORAL_TABLET | Freq: Three times a day (TID) | ORAL | Status: DC
  Administered 2020-07-20 – 2020-07-26 (×18): 5 mg via ORAL
  Filled 2020-07-20 (×18): qty 1

## 2020-07-20 MED ORDER — MELATONIN 3 MG PO TABS
6.0000 mg | ORAL_TABLET | Freq: Every day | ORAL | Status: DC
  Administered 2020-07-20 – 2020-07-25 (×6): 6 mg via ORAL
  Filled 2020-07-20 (×6): qty 2

## 2020-07-20 MED ORDER — SENNA 8.6 MG PO TABS
2.0000 | ORAL_TABLET | Freq: Two times a day (BID) | ORAL | Status: DC
  Administered 2020-07-20 – 2020-07-21 (×2): 17.2 mg via ORAL
  Filled 2020-07-20 (×2): qty 2

## 2020-07-20 MED ORDER — POLYETHYLENE GLYCOL 3350 17 G PO PACK: 17.0000 g | PACK | Freq: Every day | ORAL | Status: DC | PRN

## 2020-07-20 MED ORDER — MIDODRINE HCL 5 MG PO TABS
5.0000 mg | ORAL_TABLET | Freq: Three times a day (TID) | ORAL | Status: DC
  Administered 2020-07-20: 5 mg via ORAL
  Filled 2020-07-20 (×2): qty 1

## 2020-07-20 NOTE — Progress Notes (Signed)
[facility] PHYSICAL MEDICINE & REHABILITATION PROGRESS NOTE   Subjective/Complaints:  Cont of bladder, patient indicates he has had a bowel movement.  This was not recorded by nursing Working with physical therapy.  PT notes some elevation of blood pressure with laying, lower lip sitting, no dizziness  ROS no chest pain shortness of breath nausea vomiting or diarrhea, positive hiccups intractable  Objective:   No results found. Recent Labs    07/18/20 0255 07/19/20 1642  WBC 19.4* 18.0*  HGB 13.9 14.3  HCT 40.5 41.4  PLT 282 305   Recent Labs    07/18/20 0255 07/19/20 1642  NA 139  --   K 4.0  --   CL 105  --   CO2 25  --   GLUCOSE 149*  --   BUN 14  --   CREATININE 0.84 0.95  CALCIUM 9.4  --     Intake/Output Summary (Last 24 hours) at 07/20/2020 0959 Last data filed at 07/20/2020 0725 Gross per 24 hour  Intake --  Output 1075 ml  Net -1075 ml        Physical Exam: Vital Signs Blood pressure 131/90, pulse (!) 47, temperature 98.4 F (36.9 C), temperature source Oral, resp. rate 18, SpO2 100 %.   General: No acute distress, thin cachectic Mood and affect are appropriate Heart: Regular rate and rhythm no rubs murmurs or extra sounds Lungs: Clear to auscultation, breathing unlabored, no rales or wheezes Abdomen: Positive bowel sounds, soft nontender to palpation, nondistended Extremities: No clubbing, cyanosis, or edema Skin: No evidence of breakdown, no evidence of rash Neurologic: Cranial nerves II through XII intact, motor strength is 4/5 in bilateral deltoid, bicep, tricep, grip, hip flexor, knee extensors, ankle dorsiflexor and plantar flexor Sensory exam normal sensation to light touch and proprioception in bilateral upper and lower extremities Cerebellar exam dysmetria right greater than left finger to nose to finger as well as heel to shin in bilateral upper and lower extremities Musculoskeletal: Full range of motion in all 4 extremities. No joint  swelling   Assessment/Plan: 1. Functional deficits secondary to MS exacerbation which require 3+ hours per day of interdisciplinary therapy in a comprehensive inpatient rehab setting.  Physiatrist is providing close team supervision and 24 hour management of active medical problems listed below.  Physiatrist and rehab team continue to assess barriers to discharge/monitor patient progress toward functional and medical goals  Care Tool:  Bathing              Bathing assist       Upper Body Dressing/Undressing Upper body dressing        Upper body assist      Lower Body Dressing/Undressing Lower body dressing            Lower body assist       Toileting Toileting    Toileting assist Assist for toileting: Independent with assistive device Assistive Device Comment:  (Urinal)   Transfers Chair/bed transfer  Transfers assist           Locomotion Ambulation   Ambulation assist              Walk 10 feet activity   Assist           Walk 50 feet activity   Assist           Walk 150 feet activity   Assist           Walk 10 feet on uneven surface  activity  Assist           Wheelchair     Assist               Wheelchair 50 feet with 2 turns activity    Assist            Wheelchair 150 feet activity     Assist          Blood pressure 131/90, pulse (!) 47, temperature 98.4 F (36.9 C), temperature source Oral, resp. rate 18, SpO2 100 %.  Medical Problem List and Plan: 1.Decreased functional abilitysecondary to multiple sclerosis exacerbation. IV Solu-Medrol x5 completed. Follow-up outpatient Dr. Epimenio Foot -patient may shower -ELOS/Goals: 5-7 2. Antithrombotics: -DVT/anticoagulation:Subcutaneous Lovenox -antiplatelet therapy: N/A 3. Pain Management:Tylenol as needed 4. Mood/mild autism:Patient is cognitively at baseline. Melatonin 6 mg nightly  as needed -antipsychotic agents: N/A 5. Neuropsych: This patientiscapable of making decisions on hisown behalf. 6. Skin/Wound Care:Routine skin checks 7. Fluids/Electrolytes/Nutrition:Routine in and outs with follow-up chemistries Good urine Output, not incont 8. Orthostasis. ProAmatine 7.5 mg 3 times daily. Monitor with increased mobility BPs running on the high side, will try reducing to 5 mg 3 times daily and check orthostatic vitals daily 9. Constipation. MiraLAX as needed, no BM recorded since rehab admit yesterday   10.  Leukocytosis- afebrile, likely due to steroids 11.  Intractable hiccups, likely MS related, will trial baclofen LOS: 1 days A FACE TO FACE EVALUATION WAS PERFORMED  Erick Colace 07/20/2020, 9:59 AM

## 2020-07-20 NOTE — Evaluation (Signed)
Occupational Therapy Assessment and Plan  Patient Details  Name: Jaime [name] MRN: [id number] Date of Birth: December 07, 1995  OT Diagnosis: ataxia and coordination disorder Rehab Potential: Rehab Potential (ACUTE ONLY): Good ELOS: 5-7 days   Today's Date: 07/20/2020 OT Individual Time: 7510-2585 OT Individual Time Calculation (min): 58 min     Hospital Problem: Principal Problem:   Multiple sclerosis exacerbation ([place])   Past Medical History:  Past Medical History:  Diagnosis Date  . Autism   . Depression   . MS (multiple sclerosis) ([facility])    Past Surgical History:  Past Surgical History:  Procedure Laterality Date  . NO PAST SURGERIES      Assessment & Plan Clinical Impression: Jaime [name] is a 24 year old right-handed male history of mild autism, hypertension recently diagnosed multiple sclerosis March 2021 followed by neurology services Dr. Felecia Shelling. Patient well-known to rehab services from multiple sclerosis exacerbations 12/17/2019 to 12/26/2019 as well as 05/24/2020 to 06/05/2020. He was discharged home ambulating contact-guard assist. He lives with his mother and 43 year old and 1 year old siblings. Two-level home bed and bath upstairs. Presented 07/15/2020 with relapsing multiple sclerosis weakness left greater than right. MRI of the brain showed numerous subcentimeter enhancing lesions within the juxtacortical subcortical white matter of the bilateral cerebral hemispheres. Placed on intravenous Solu-Medrol x5 doses. Subcutaneous Lovenox for DVT prophylaxis. ProAmatine ongoing for history of orthostasis. Tolerating a regular diet. Therapy evaluations completed and patient was admitted for a comprehensive rehab program.     Patient currently requires min with basic self-care skills secondary to muscle weakness, impaired timing and sequencing, ataxia and decreased coordination and decreased standing balance.  Prior to hospitalization, patient could complete BADLs  with supervision-CGA.  Patient will benefit from skilled intervention to increase independence with basic self-care skills prior to discharge home with family.  Anticipate patient will require 24 hour supervision and minimal physical assistance and follow up home health.  OT - End of Session Endurance Deficit: Yes Endurance Deficit Description: He needed to lie down in bed to complete certain components of dressing due to dizziness/fatigue OT Assessment Rehab Potential (ACUTE ONLY): Good OT Barriers to Discharge: Inaccessible home environment OT Barriers to Discharge Comments: Bed/bathroom upstairs, bathroom is not walker accessible per pt and he fell at home when using the restroom without device OT Patient demonstrates impairments in the following area(s): Balance;Motor;Endurance;Safety OT Basic ADL's Functional Problem(s): Grooming;Bathing;Dressing;Toileting OT Advanced ADL's Functional Problem(s): Simple Meal Preparation OT Transfers Functional Problem(s): Toilet;Tub/Shower OT Additional Impairment(s): Fuctional Use of Upper Extremity OT Plan OT Intensity: Minimum of 1-2 x/day, 45 to 90 minutes OT Frequency: 5 out of 7 days OT Duration/Estimated Length of Stay: 5-7 days OT Treatment/Interventions: Balance/vestibular training;Discharge planning;Self Care/advanced ADL retraining;Therapeutic Activities;Functional mobility training;Patient/family education;Therapeutic Exercise;Visual/perceptual remediation/compensation;UE/LE Coordination activities;Disease mangement/prevention;Wheelchair propulsion/positioning;UE/LE Strength taining/ROM;Psychosocial support;Neuromuscular re-education;DME/adaptive equipment instruction;Community reintegration OT Self Feeding Anticipated Outcome(s): no goal OT Basic Self-Care Anticipated Outcome(s): Supervision OT Toileting Anticipated Outcome(s): Supervision OT Bathroom Transfers Anticipated Outcome(s): CGA OT Recommendation Recommendations for Other  Services: Neuropsych consult Patient destination: Home Follow Up Recommendations: Other (comment) (TBD) Equipment Recommended: To be determined   OT Evaluation Precautions/Restrictions  Precautions Precautions: Fall Precaution Comments: Orthostatic hypotension and elevated HR with activity, ataxia General   Vital Signs  Pain   Home Living/Prior Functioning Home Living Available Help at Discharge: Family, Available 24 hours/day Type of Home: Apartment Home Access: Stairs to enter [employer] of Steps: 2 without rail at back entrance, 4 wiht bil rail at front entrance, per pt Entrance Stairs-Rails: Can  reach both Home Layout: Two level, Bed/bath upstairs Alternate Level Stairs-Number of Steps: 6-8 steps Alternate Level Stairs-Rails: Right Bathroom Shower/Tub: Multimedia programmer: Standard Bathroom Accessibility: No  Lives With: Family IADL History Homemaking Responsibilities: No Leisure and Hobbies: Drawing and playing videogames Prior Function Level of Independence: Needs assistance with ADLs (CGA-supervision for BADLs and functional transfers PTA per pt report)  Able to Take Stairs?: Yes Driving: No Vision Baseline Vision/History: Wears glasses Wears Glasses: At all times Patient Visual Report: No change from baseline Perception  Perception: Within Functional Limits Praxis Praxis: Intact Cognition Overall Cognitive Status: Within Functional Limits for tasks assessed Arousal/Alertness: Awake/alert Orientation Level: Person;Place;Situation Person: Oriented Place: Oriented Situation: Oriented Year: 2021 Month: October Day of Week: Correct Memory: Appears intact Immediate Memory Recall: Sock;Blue;Bed Memory Recall Sock: Without Cue Memory Recall Blue: Without Cue Memory Recall Bed: Without Cue Attention: Focused Focused Attention: Appears intact Selective Attention: Appears intact Awareness: Appears intact Problem Solving: Appears  intact Safety/Judgment: Appears intact Sensation Sensation Light Touch: Appears Intact Coordination Gross Motor Movements are Fluid and Coordinated: No Fine Motor Movements are Fluid and Coordinated: No Coordination and Movement Description: UE tremors R>L and ataxia with orthostatic hypotension limiting mobility Finger Nose Finger Test: tremulous Rt>Lt Motor  Motor Motor: Ataxia;Abnormal postural alignment and control Motor - Skilled Clinical Observations: Ataxic and tremulous, limited balance strategies  Trunk/Postural Assessment  Cervical Assessment Cervical Assessment: Within Functional Limits Thoracic Assessment Thoracic Assessment: Exceptions to [facility] (kyphotic) Lumbar Assessment Lumbar Assessment: Exceptions to [facility] (posterior pelvic tilt) Postural Control Postural Control: Deficits on evaluation (impaired)  Balance Balance Balance Assessed: Yes Dynamic Sitting Balance Dynamic Sitting - Balance Support: No upper extremity supported Dynamic Sitting - Level of Assistance: 4: Min assist (donning gripper socks) Dynamic Standing Balance Dynamic Standing - Balance Support: No upper extremity supported;During functional activity Dynamic Standing - Level of Assistance: 4: Min assist (LB dressing) Extremity/Trunk Assessment RUE Assessment RUE Assessment: Exceptions to [facility] Active Range of Motion (AROM) Comments: WNL General Strength Comments: 4/5 grossly, tremor activity Rt>Lt LUE Assessment LUE Assessment: Exceptions to [facility] Active Range of Motion (AROM) Comments: WNL General Strength Comments: 4/5 grossly, tremor activity  Care Tool Care Tool Self Care Eating   Eating Assist Level: Set up assist    Oral Care     setup assist    Bathing   Body parts bathed by patient: Right arm;Left arm;Chest;Abdomen;Front perineal area;Buttocks;Face;Right upper leg;Left upper leg;Left lower leg;Right lower leg     Assist Level: Contact Guard/Touching assist    Upper Body  Dressing(including orthotics)   What is the patient wearing?: Pull over shirt   Assist Level: Set up assist    Lower Body Dressing (excluding footwear)   What is the patient wearing?: Pants;Underwear/pull up Assist for lower body dressing: Contact Guard/Touching assist    Putting on/Taking off footwear   What is the patient wearing?: Non-skid slipper socks Assist for footwear: Contact Guard/Touching assist       Care Tool Toileting Toileting activity   Assist for toileting: Contact Guard/Touching assist Assistive Device Comment: urinal.     Care Tool Transfers Sit to stand transfer   Sit to stand assist level: Minimal Assistance - Patient > 75% Sit to stand assistive device: Walker  Chair/bed transfer   Chair/bed transfer assist level: Minimal Assistance - Patient > 75%     Toilet transfer   Assist Level: Minimal Assistance - Patient > 75%     Care Tool Cognition Expression of Ideas and Wants Expression  of Ideas and Wants: Some difficulty - exhibits some difficulty with expressing needs and ideas (e.g, some words or finishing thoughts) or speech is not clear   Understanding Verbal and Non-Verbal Content Understanding Verbal and Non-Verbal Content: Usually understands - understands most conversations, but misses some part/intent of message. Requires cues at times to understand   Memory/Recall Ability *first 3 days only Memory/Recall Ability *first 3 days only: Current season;Location of own room;Staff names and faces;That he or she is in a hospital/hospital unit    Refer to Care Plan for [place] 1 OT Short Term Goal 1 (Week 1): STGS=LTGS due to ELOS  Recommendations for other services: None    Skilled Therapeutic Intervention Skilled OT session completed with focus on initial evaluation, education on OT role/POC, and establishment of patient-centered goals.   Pt greeted in bed with no c/o pain. Agreeable to shower. Bed mobility completed  with setup assistance using bedrails with HOB elevated. Ambulatory transfer to toilet completed using RW with Min A, pt with ataxic LE movements. CGA for balance during clothing mgt with pt having +bladder void. Transitioned to the TTB where pt then bathed at sit<stand level, CGA for dynamic standing balance. He was able to use figure 4 position to wash his feet. Pt then ambulated to EOB to engage in dressing tasks. OT donned his Teds. Pt utilized bridge technique to elevate pants and also donned shirt bedlevel with setup assistance. Pt remained in bed at close of session, eating his breakfast. He states that he is usually Rt hand dominant however tremor activity is more prominent in Rt, and therefore he uses Lt hand at dominant level to eat and when engaging in self care. This was also observed by OT throughout session. Pt remained in bed with all needs within reach and bed alarm set.   Pt rated his perceived dizziness during activity as 1-2 out of 10 (10 being worst dizziness ever experienced). He opted to bridge to pull up pants to accommodate dizziness which was rated at a 2 at this point. Notified RN of ordering abdominal binder, as he used binder during previous CIR stays.   ADL ADL Eating: Set up Where Assessed-Eating: Bed level Grooming: Setup Upper Body Bathing: Supervision/safety Where Assessed-Upper Body Bathing: Shower Lower Body Bathing: Contact guard Where Assessed-Lower Body Bathing: Shower Upper Body Dressing: Setup Where Assessed-Upper Body Dressing: Bed level Lower Body Dressing: Contact guard Where Assessed-Lower Body Dressing: Other (Comment);Bed level (CGA doffing clothing sit<stand prior to shower) Toileting: Not assessed Toilet Transfer: Minimal assistance Toilet Transfer Method: Ambulating (RW) Toilet Transfer Equipment: Energy manager: Minimal assistance Social research officer, government Method: Ambulating (RW) Youth worker: Careers information officer A ambulatory bathroom transfers using RW   Discharge Criteria: Patient will be discharged from OT if patient refuses treatment 3 consecutive times without medical reason, if treatment goals not met, if there is a change in medical status, if patient makes no progress towards goals or if patient is discharged from hospital.  The above assessment, treatment plan, treatment alternatives and goals were discussed and mutually agreed upon: by patient  Skeet Simmer 07/20/2020, 4:25 PM

## 2020-07-20 NOTE — Evaluation (Signed)
Physical Therapy Assessment and Plan  Patient Details  Name: Jaime [name] MRN: [id number] Date of Birth: 07-08-1996  PT Diagnosis: Abnormality of gait, Ataxia, Ataxic gait, Coordination disorder, Difficulty walking, Dizziness and giddiness and Muscle weakness Rehab Potential: Good ELOS: 5-7   Today's Date: 07/20/2020 PT Individual Time: 0940-1040 PT Individual Time Calculation (min): 60 min    Hospital Problem: Principal Problem:   Multiple sclerosis exacerbation ([place])   Past Medical History:  Past Medical History:  Diagnosis Date  . Autism   . Depression   . MS (multiple sclerosis) ([place])    Past Surgical History:  Past Surgical History:  Procedure Laterality Date  . NO PAST SURGERIES      Assessment & Plan Clinical Impression: Patient is a 24 y.o. year old right-handed male history of mild autism, hypertension recently diagnosed multiple sclerosis March 2021 followed by neurology services Dr. Felecia Shelling. Patient well-known to rehab services from multiple sclerosis exacerbations 12/17/2019 to 12/26/2019 as well as 05/24/2020 to 06/05/2020. He was discharged home ambulating contact-guard assist. He lives with his mother and 59 year old and 32 year old siblings. Two-level home bed and bath upstairs. Presented 07/15/2020 with relapsing multiple sclerosis weakness left greater than right. MRI of the brain showed numerous subcentimeter enhancing lesions within the juxtacortical subcortical white matter of the bilateral cerebral hemispheres. Placed on intravenous Solu-Medrol x5 doses. Subcutaneous Lovenox for DVT prophylaxis. ProAmatine ongoing for history of orthostasis. Tolerating a regular diet. Therapy evaluations completed and patient was admitted for a comprehensive rehab program. Patient transferred to [facility] on 07/19/2020 .   Patient currently requires min with mobility secondary to muscle weakness, decreased cardiorespiratoy endurance, ataxia and decreased coordination and  decreased sitting balance, decreased standing balance, decreased postural control and decreased balance strategies.  Prior to hospitalization, patient was independent  with mobility and lived with Family in a [place] home.  Home access is 2 without rail at back entrance, 4 wiht bil rail at front entrance, per ptStairs to enter.  Patient will benefit from skilled PT intervention to maximize safe functional mobility, minimize fall risk and decrease caregiver burden for planned discharge home with intermittent assist.  Anticipate patient will benefit from follow up OP at discharge.  PT - End of Session Activity Tolerance: Tolerates < 10 min activity with changes in vital signs Endurance Deficit: Yes Endurance Deficit Description: dizziness upon sitting up and standing up, nausea with prolonged sitting PT Assessment Rehab Potential (ACUTE/IP ONLY): Good PT Barriers to Discharge: Home environment access/layout;Lack of/limited family support PT Barriers to Discharge Comments: Patient has multilevel home with 4 STE with B rails and a full flight with R rail to access bed/bath; patient's mother is only adult caregiver available at d/c, also cares for patient's younger siblings PT Patient demonstrates impairments in the following area(s): Balance;Safety;Endurance;Motor;Nutrition PT Transfers Functional Problem(s): Bed Mobility;Bed to Chair;Car;Furniture;Floor PT Locomotion Functional Problem(s): Ambulation;Wheelchair Mobility;Stairs PT Plan PT Intensity: Minimum of 1-2 x/day ,45 to 90 minutes PT Frequency: 5 out of 7 days PT Duration Estimated Length of Stay: 5-7 PT Treatment/Interventions: Ambulation/gait training;Discharge planning;DME/adaptive equipment instruction;Functional mobility training;Pain management;Psychosocial support;Splinting/orthotics;Therapeutic Activities;UE/LE Strength taining/ROM;Visual/perceptual remediation/compensation;Balance/vestibular training;Community  reintegration;Neuromuscular re-education;Patient/family education;Stair training;Therapeutic Exercise;UE/LE Coordination activities;Wheelchair propulsion/positioning PT Transfers Anticipated Outcome(s): mod I using LRAD PT Locomotion Anticipated Outcome(s): Supervision uisng LRAD >150 ft PT Recommendation Recommendations for Other Services: Neuropsych consult Follow Up Recommendations: Outpatient PT Patient destination: Home Equipment Recommended: To be determined Equipment Details: has RW from previous admission   PT Evaluation Precautions/Restrictions Precautions Precautions: Fall Precaution Comments: Orthostatic hypotension and  elevated HR with activity Home Living/Prior Functioning Home Living Available Help at Discharge: Family;Available 24 hours/day Type of Home: Apartment Home Access: Stairs to enter Entrance Stairs-Number of Steps: 2 without rail at back entrance, 4 wiht bil rail at front entrance, per pt Entrance Stairs-Rails: Can reach both Home Layout: Two level;Bed/bath upstairs Alternate Level Stairs-Number of Steps: full flight Alternate Level Stairs-Rails: Right Bathroom Shower/Tub: Chiropodist: Standard Bathroom Accessibility: Yes  Lives With: Family Prior Function Level of Independence: Independent with gait;Independent with transfers  Able to Take Stairs?: Yes Driving: No Vision/Perception  Vision - Assessment Eye Alignment: Within Functional Limits Ocular Range of Motion: Within Functional Limits Alignment/Gaze Preference: Within Defined Limits Tracking/Visual Pursuits: Decreased smoothness of horizontal tracking;Decreased smoothness of vertical tracking;Decreased smoothness of eye movement to RIGHT superior field;Decreased smoothness of eye movement to LEFT superior field;Decreased smoothness of eye movement to RIGHT inferior field;Decreased smoothness of eye movement to LEFT inferior field Saccades: Additional eye shifts occurred during  testing;Decreased speed of saccadic movement Perception Perception: Within Functional Limits Praxis Praxis: Intact  Cognition Overall Cognitive Status: Within Functional Limits for tasks assessed Arousal/Alertness: Awake/alert Orientation Level: Oriented X4 Attention: Focused Focused Attention: Appears intact Selective Attention: Appears intact Memory: Appears intact Awareness: Appears intact Problem Solving: Appears intact Safety/Judgment: Appears intact Sensation Sensation Light Touch: Appears Intact Proprioception: Impaired by gross assessment Coordination Gross Motor Movements are Fluid and Coordinated: No Fine Motor Movements are Fluid and Coordinated: No Coordination and Movement Description: UE tremors R>L and ataxia with orthostatic hypotension limiting mobility Finger Nose Finger Test: slow and deliberate with UE tremor R>L, good accuracy Heel Shin Test: ataxia noted during testing, equal bilaterally Motor  Motor Motor: Ataxia;Abnormal postural alignment and control Motor - Skilled Clinical Observations: x 4 extremities, decreased postural control and balance strategies   Trunk/Postural Assessment  Cervical Assessment Cervical Assessment: Within Functional Limits Thoracic Assessment Thoracic Assessment: Exceptions to [facility] (kyphotic posture in standing) Lumbar Assessment Lumbar Assessment: Exceptions to [facility] (posterior pelvic tilt) Postural Control Postural Control: Deficits on evaluation Righting Reactions: delayed and limited Protective Responses: delayed and limited  Balance Balance Balance Assessed: Yes Static Sitting Balance Static Sitting - Balance Support: No upper extremity supported Static Sitting - Level of Assistance: 5: Stand by assistance Dynamic Sitting Balance Dynamic Sitting - Balance Support: No upper extremity supported Dynamic Sitting - Level of Assistance: 5: Stand by assistance Static Standing Balance Static Standing - Balance Support: No  upper extremity supported;During functional activity Static Standing - Level of Assistance: 4: Min assist (CGA) Dynamic Standing Balance Dynamic Standing - Balance Support: No upper extremity supported;During functional activity Dynamic Standing - Level of Assistance: 4: Min assist Extremity Assessment  RLE Assessment RLE Assessment: Exceptions to [facility] Active Range of Motion (AROM) Comments: WFL with ataxic tremors with mobility General Strength Comments: Grossly 4+/5 throughout in sitting LLE Assessment LLE Assessment: Exceptions to [facility] Active Range of Motion (AROM) Comments: WFL with ataxic tremors with mobility General Strength Comments: Grossly 4+/5 throughout in sitting  Care Tool Care Tool Bed Mobility Roll left and right activity   Roll left and right assist level: Independent    Sit to lying activity   Sit to lying assist level: Supervision/Verbal cueing    Lying to sitting edge of bed activity   Lying to sitting edge of bed assist level: Supervision/Verbal cueing     Care Tool Transfers Sit to stand transfer   Sit to stand assist level: Minimal Assistance - Patient > 75%  Chair/bed transfer   Chair/bed transfer assist level: Minimal Assistance - Patient > 75%     Psychologist, counselling transfer activity did not occur: Safety/medical concerns (evaluation limited by nausea and OH)        Care Tool Locomotion Ambulation   Assist level: Minimal Assistance - Patient > 75% Assistive device: Walker-rolling Max distance: 3 ft  Walk 10 feet activity Walk 10 feet activity did not occur: Safety/medical concerns (evaluation limited by nausea and OH)       Walk 50 feet with 2 turns activity Walk 50 feet with 2 turns activity did not occur: Safety/medical concerns (evaluation limited by nausea and OH)      Walk 150 feet activity Walk 150 feet activity did not occur: Safety/medical concerns (evaluation limited by nausea and OH)      Walk 10 feet on  uneven surfaces activity Walk 10 feet on uneven surfaces activity did not occur: Safety/medical concerns (evaluation limited by nausea and OH)      Stairs Stair activity did not occur: Safety/medical concerns (evaluation limited by nausea and OH)        Walk up/down 1 step activity Walk up/down 1 step or curb (drop down) activity did not occur: Safety/medical concerns (evaluation limited by nausea and OH)     Walk up/down 4 steps activity did not occuR: Safety/medical concerns (evaluation limited by nausea and OH)  Walk up/down 4 steps activity      Walk up/down 12 steps activity Walk up/down 12 steps activity did not occur: Safety/medical concerns (evaluation limited by nausea and OH)      Pick up small objects from floor Pick up small object from the floor (from standing position) activity did not occur: Safety/medical concerns (evaluation limited by nausea and OH)      Wheelchair   Type of Wheelchair: Manual   Wheelchair assist level: Supervision/Verbal cueing Max wheelchair distance: >150 ft  Wheel 50 feet with 2 turns activity   Assist Level: Supervision/Verbal cueing  Wheel 150 feet activity   Assist Level: Supervision/Verbal cueing    Refer to Care Plan for Long Term Goals  SHORT TERM GOAL WEEK 1 PT Short Term Goal 1 (Week 1): STG=LTG due to short ELOS.  Recommendations for other services: Neuropsych  Skilled Therapeutic Intervention In addition to the PT evaluation above, the patient performed the following skilled PT interventions: Patient in bed with B TED hose and non-skid socks donned with breakfast tray barely touched upon PT arrival. Patient alert and agreeable to PT session. Patient denied pain during session, however reported that he feels "woozy" lying in the bed. Monitored vitals with mobility, see below. RN reports patient was orthostatic with OT this morning, ordered abdominal binder for patient, not yet in patient's room.   Orthostatic vitals: Lying: BP  147/96, MAP 110, HR 54 (reports feeling "woozy") RN and MD cleared patient for mobility Sitting: BP 137/89, MAP 100, HR 57 (reports feeling "woozy" and dizzy, dizziness resolved <30 sec) Standing: BP 90/60, MAP 97, HR 118 (reports feeling "woozy" and dizzy in standing, resolves once seated)  Therapeutic Activity: Bed Mobility: Patient performed rolling R/L independently in a flat bed without use of bed rails and supine to/from sit with supervision for safety due to dizziness and tremors.  Transfers: Patient performed sit to/from stand x1 and stand pivot x1 with min A without an AD, performed stand pivot x1 with RW with CGA following cues for hand placement  and reaching back to control descent.   Gait Training:  Patient ambulated 3 feet, limited by nausea, using RW for balance per patient's request with min A-CGA. Ambulated with ataxic gait, required assist for AD management, forward trunk flexion, and very small step length with mild steppage gait.   Wheelchair Mobility:  Patient propelled wheelchair 150 feet with supervision requiring 2-3 strokes with R UE per 1 stroke with L due to decreased motor control on R. Patient became nauseous following w/c mobility, BP 138/94, MAP 109, HR 57. Patient requested to return to the room to lie down. Transported patient back to the room with total A due to nausea and patient returned to lying in the bed, see transfers and bed mobility above.  Patient in bed at end of session with breaks locked, bed alarm set, and all needs within reach. Patient reported having intermittent headaches and hiccups since admission and reported x1 fall due to syncopal episode prior to admission. Stated that he does not check his vitals at home, however, he has been wearing his abdominal binder with mobility. Patient's affect remains flat, but appropriate. Noted decreased frustration tolerance, patient pounding on chest and thigh, with symptoms and mobility throughout evaluation,  patient is easily redirected with education on this behavior.   Instructed pt in results of PT evaluation as detailed above, PT POC, rehab potential, rehab goals, and discharge recommendations. Additionally discussed CIR's policies regarding fall safety and use of chair alarm and/or quick release belt. Pt verbalized understanding and in agreement. Will update pt's family members as they become available.  Discharge Criteria: Patient will be discharged from PT if patient refuses treatment 3 consecutive times without medical reason, if treatment goals not met, if there is a change in medical status, if patient makes no progress towards goals or if patient is discharged from hospital.  The above assessment, treatment plan, treatment alternatives and goals were discussed and mutually agreed upon: by patient  Nissim Fleischer L Gilad Dugger PT, DPT  07/20/2020, 11:29 AM

## 2020-07-20 NOTE — Plan of Care (Signed)
  Problem: RH Bathing Goal: LTG Patient will bathe all body parts with assist levels (OT) Description: LTG: Patient will bathe all body parts with assist levels (OT) Flowsheets (Taken 07/20/2020 1629) LTG: Pt will perform bathing with assistance level/cueing: Supervision/Verbal cueing   Problem: RH Dressing Goal: LTG Patient will perform lower body dressing w/assist (OT) Description: LTG: Patient will perform lower body dressing with assist, with/without cues in positioning using equipment (OT) Flowsheets (Taken 07/20/2020 1629) LTG: Pt will perform lower body dressing with assistance level of: (excluding Teds)  Supervision/Verbal cueing  Other (Comment)   Problem: RH Toileting Goal: LTG Patient will perform toileting task (3/3 steps) with assistance level (OT) Description: LTG: Patient will perform toileting task (3/3 steps) with assistance level (OT)  Flowsheets (Taken 07/20/2020 1629) LTG: Pt will perform toileting task (3/3 steps) with assistance level: Supervision/Verbal cueing   Problem: RH Toilet Transfers Goal: LTG Patient will perform toilet transfers w/assist (OT) Description: LTG: Patient will perform toilet transfers with assist, with/without cues using equipment (OT) Flowsheets (Taken 07/20/2020 1629) LTG: Pt will perform toilet transfers with assistance level of: Contact Guard/Touching assist   Problem: RH Tub/Shower Transfers Goal: LTG Patient will perform tub/shower transfers w/assist (OT) Description: LTG: Patient will perform tub/shower transfers with assist, with/without cues using equipment (OT) Flowsheets (Taken 07/20/2020 1629) LTG: Pt will perform tub/shower stall transfers with assistance level of: Contact Guard/Touching assist

## 2020-07-20 NOTE — Progress Notes (Signed)
Orthopedic Tech Progress Note Patient Details:  Jaime [name] [birth year] [id number]  Ortho Devices Type of Ortho Device: Abdominal binder Ortho Device/Splint Interventions: Ordered   Post Interventions Patient Tolerated: Well Instructions Provided: Poper ambulation with device, Care of device   Richardo Popoff P [name] 07/20/2020, 11:10 AM

## 2020-07-20 NOTE — Progress Notes (Addendum)
Inpatient Rehabilitation Medication Review by a Pharmacist  A complete drug regimen review was completed for this patient to identify any potential clinically significant medication issues.  Clinically significant medication issues were identified:  no   Type of Medication Issue Identified Description of Issue Urgent (address now) Non-Urgent (address on AM team rounds) Plan Plan Accepted by Provider? (Yes / No / Pending AM Rounds)  Drug Interaction(s) (clinically significant)       Duplicate Therapy       Allergy       No Medication Administration End Date       Incorrect Dose       Additional Drug Therapy Needed  Melatonin 6mg  QHS and bowel regimen not ordered Non-urgent Ordered Yes  Other         Name of provider notified for urgent issues identified:   Provider Method of Notification:    For non-urgent medication issues to be resolved on team rounds tomorrow morning a [facility] Secure Chat Handoff was sent to:    Pharmacist comments:   Time spent performing this drug regimen review (minutes):  10   , PharmD, BCPS Clinical Pharmacist [phone] Please check AMION for all [facility] numbers 07/20/2020

## 2020-07-21 ENCOUNTER — Encounter (HOSPITAL_COMMUNITY): Payer: Self-pay | Admitting: Physical Medicine and Rehabilitation

## 2020-07-21 ENCOUNTER — Inpatient Hospital Stay (HOSPITAL_COMMUNITY): Payer: Medicaid Other | Admitting: Occupational Therapy

## 2020-07-21 ENCOUNTER — Inpatient Hospital Stay (HOSPITAL_COMMUNITY): Payer: Medicaid Other

## 2020-07-21 LAB — CBC WITH DIFFERENTIAL/PLATELET
Abs Immature Granulocytes: 0.12 10*3/uL — ABNORMAL HIGH (ref 0.00–0.07)
Basophils Absolute: 0 10*3/uL (ref 0.0–0.1)
Basophils Relative: 0 %
Eosinophils Absolute: 0 10*3/uL (ref 0.0–0.5)
Eosinophils Relative: 0 %
HCT: 43 % (ref 39.0–52.0)
Hemoglobin: 15.1 g/dL (ref 13.0–17.0)
Immature Granulocytes: 1 %
Lymphocytes Relative: 17 %
Lymphs Abs: 2.2 10*3/uL (ref 0.7–4.0)
MCH: 31.6 pg (ref 26.0–34.0)
MCHC: 35.1 g/dL (ref 30.0–36.0)
MCV: 90 fL (ref 80.0–100.0)
Monocytes Absolute: 1 10*3/uL (ref 0.1–1.0)
Monocytes Relative: 8 %
Neutro Abs: 9.8 10*3/uL — ABNORMAL HIGH (ref 1.7–7.7)
Neutrophils Relative %: 74 %
Platelets: 273 10*3/uL (ref 150–400)
RBC: 4.78 MIL/uL (ref 4.22–5.81)
RDW: 13.1 % (ref 11.5–15.5)
WBC: 13.2 10*3/uL — ABNORMAL HIGH (ref 4.0–10.5)
nRBC: 0 % (ref 0.0–0.2)

## 2020-07-21 LAB — COMPREHENSIVE METABOLIC PANEL
ALT: 16 U/L (ref 0–44)
AST: 15 U/L (ref 15–41)
Albumin: 3.4 g/dL — ABNORMAL LOW (ref 3.5–5.0)
Alkaline Phosphatase: 40 U/L (ref 38–126)
Anion gap: 10 (ref 5–15)
BUN: 18 mg/dL (ref 6–20)
CO2: 26 mmol/L (ref 22–32)
Calcium: 9.1 mg/dL (ref 8.9–10.3)
Chloride: 103 mmol/L (ref 98–111)
Creatinine, Ser: 0.83 mg/dL (ref 0.61–1.24)
GFR, Estimated: >60 mL/min (ref >60)
Glucose, Bld: 89 mg/dL (ref 70–99)
Potassium: 3.3 mmol/L — ABNORMAL LOW (ref 3.5–5.1)
Sodium: 139 mmol/L (ref 135–145)
Total Bilirubin: 0.5 mg/dL (ref 0.3–1.2)
Total Protein: 6.1 g/dL — ABNORMAL LOW (ref 6.5–8.1)

## 2020-07-21 MED ORDER — POTASSIUM CHLORIDE 20 MEQ PO PACK
40.0000 meq | PACK | Freq: Two times a day (BID) | ORAL | Status: AC
  Administered 2020-07-21 (×2): 40 meq via ORAL
  Filled 2020-07-21 (×2): qty 2

## 2020-07-21 MED ORDER — SENNA 8.6 MG PO TABS
2.0000 | ORAL_TABLET | Freq: Every day | ORAL | Status: DC
  Administered 2020-07-21 – 2020-07-25 (×5): 17.2 mg via ORAL
  Filled 2020-07-21 (×4): qty 2

## 2020-07-21 MED ORDER — DOCUSATE SODIUM 100 MG PO CAPS
100.0000 mg | ORAL_CAPSULE | Freq: Three times a day (TID) | ORAL | Status: DC
  Administered 2020-07-21 – 2020-07-25 (×13): 100 mg via ORAL
  Filled 2020-07-21 (×15): qty 1

## 2020-07-21 NOTE — Progress Notes (Signed)
Inpatient Rehabilitation  Patient information reviewed and entered into eRehab system by Jezlyn Westerfield M. Niv Darley, M.A., CCC/SLP, PPS Coordinator.  Information including medical coding, functional ability and quality indicators will be reviewed and updated through discharge.    

## 2020-07-21 NOTE — Telephone Encounter (Signed)
I called pt's mother again to remind her to come finish the tysbari start form. No answer, VM not set up.  Pt's mother did not come by last week as she indicated that she would to sign the tysabri start form on behalf of the pt.

## 2020-07-21 NOTE — Progress Notes (Signed)
Signed    Expand All Collapse All           Physical Medicine and Rehabilitation Consult Reason for Consult: Decreased functional mobility Referring Physician: Internal medicine     HPI: Jaime [name] is a 24 y.o. right-handed male with history of mild autism, hypotension and recently diagnosed multiple sclerosis March 2021 followed by Dr. Epimenio Foot.  History taken from chart review and patient.  Patient well-known to rehab services from multiple exacerbations of MS 12/17/2019 to 12/26/2019 as well as 05/24/2020 to 06/05/2020.  He was discharged to home ambulating contact-guard assist.  Patient lives with his mother and 59 year old and 55 year old siblings.  Two-level home bed and bath upstairs.  He presented on 07/15/2020 with relapsing multiple sclerosis weakness left>right.  MRI the brain showed numerous subcentimeter enhancing lesions within the juxtacortical subcortical white matter of the bilateral cerebral hemispheres.  Currently maintained on Lovenox for DVT prophylaxis.  Intravenous Solu-Medrol initiated x5 doses.  ProAmatine for monitoring of associated orthostasis.  Tolerating regular diet.  Therapy evaluations completed with recommendations of physical medicine rehab consult.   Review of Systems  Constitutional: Negative for chills and fever.  HENT: Negative for hearing loss.   Eyes: Positive for blurred vision. Negative for double vision.  Respiratory: Negative for cough and shortness of breath.   Cardiovascular: Positive for leg swelling. Negative for chest pain.  Gastrointestinal: Positive for constipation. Negative for heartburn, nausea and vomiting.  Genitourinary: Negative for dysuria and hematuria.  Musculoskeletal: Positive for myalgias.  Skin: Negative for rash.  Neurological: Positive for sensory change, speech change and weakness.  Psychiatric/Behavioral: Positive for depression.       Autism  All other systems reviewed and are negative.       Past Medical History:    Diagnosis Date  . Autism    . Depression    . MS (multiple sclerosis) (HCC)           Past Surgical History:  Procedure Laterality Date  . NO PAST SURGERIES             Family History  Problem Relation Age of Onset  . Clotting disorder Mother      Social History:  reports that he has never smoked. He has never used smokeless tobacco. He reports that he does not drink alcohol and does not use drugs. Allergies: No Known Allergies       Medications Prior to Admission  Medication Sig Dispense Refill  . midodrine (PROAMATINE) 2.5 MG tablet Take 3 tablets (7.5 mg total) by mouth 3 (three) times daily with meals. 90 tablet 0  . pantoprazole (PROTONIX) 40 MG tablet Take 1 tablet (40 mg total) by mouth daily. 30 tablet 0  . acetaminophen (TYLENOL) 325 MG tablet Take 2 tablets (650 mg total) by mouth every 4 (four) hours as needed for mild pain or fever (or Fever >/= 101). (Patient not taking: Reported on 07/15/2020)      . melatonin 3 MG TABS tablet Take 2 tablets (6 mg total) by mouth at bedtime. (Patient not taking: Reported on 06/19/2020) 30 tablet 0  . senna (SENOKOT) 8.6 MG TABS tablet Take 2 tablets (17.2 mg total) by mouth 2 (two) times daily at 10 AM and 5 PM. (Patient not taking: Reported on 07/15/2020) 120 tablet 0  . sertraline (ZOLOFT) 50 MG tablet Take 1 tablet (50 mg total) by mouth daily. (Patient not taking: Reported on 07/15/2020) 30 tablet 11      Home: Home Living Family/patient  expects to be discharged to:: Private residence Living Arrangements: Parent, Other relatives Available Help at Discharge: Family, Available 24 hours/day Type of Home: Apartment Home Access: Stairs to enter [employer] of Steps: 2 without rail at back entrance, 4 wiht bil rail at front entrance, per pt Entrance Stairs-Rails: Can reach both Home Layout: Two level, Bed/bath upstairs Alternate Level Stairs-Number of Steps: full flight Alternate Level Stairs-Rails: Right Bathroom  Shower/Tub: Engineer, manufacturing systems: Standard Bathroom Accessibility: Yes Home Equipment: Walker - 2 wheels  Functional History: Prior Function Level of Independence: Needs assistance Gait / Transfers Assistance Needed: pt reports he has been ambulating with use of a walker recently. Fell on 10/8 attempting to get to the bathroom. Pt's mother has been assisting with stairs and ADLs Functional Status:  Mobility: Bed Mobility Overal bed mobility: Needs Assistance Bed Mobility: Supine to Sit, Sit to Supine Supine to sit: Supervision Sit to supine: Supervision Transfers Overall transfer level: Needs assistance Equipment used: Rolling walker (2 wheeled) Transfers: Sit to/from Stand Sit to Stand: Min assist Ambulation/Gait Ambulation/Gait assistance: Min Chemical engineer (Feet): 10 Feet Assistive device: Rolling walker (2 wheeled) Gait Pattern/deviations: Step-to pattern General Gait Details: pt with short step to gait, tremors noted during ambulation Gait velocity: reduced Gait velocity interpretation: <1.31 ft/sec, indicative of household ambulator   ADL:   Cognition: Cognition Overall Cognitive Status: History of cognitive impairments - at baseline Orientation Level: Oriented X4 Cognition Arousal/Alertness: Awake/alert Behavior During Therapy: Flat affect Overall Cognitive Status: History of cognitive impairments - at baseline General Comments: slowed processing at times, pt with history of high functioning autism. Pt has good awareness of situation and follows commands well.   Blood pressure 97/62, pulse (!) 52, temperature 97.7 F (36.5 C), temperature source Oral, resp. rate 18, weight 53.6 kg, SpO2 97 %. Physical Exam Vitals reviewed.  Constitutional:      Comments: Thin  HENT:     Head: Normocephalic and atraumatic.     Nose: Nose normal.  Eyes:     General:        Right eye: No discharge.        Left eye: No discharge.     Extraocular Movements:  Extraocular movements intact.  Cardiovascular:     Rate and Rhythm: Normal rate and regular rhythm.  Pulmonary:     Effort: Pulmonary effort is normal. No respiratory distress.     Breath sounds: Normal breath sounds. No stridor.  Abdominal:     General: Abdomen is flat. Bowel sounds are normal. There is no distension.  Musculoskeletal:     Cervical back: Normal range of motion and neck supple.     Comments: No edema or tenderness in extremities  Skin:    General: Skin is warm and dry.  Neurological:     Mental Status: He is alert.     Comments: Alert He does provide his name and age.   Follows commands. Motor: Grossly 5/5 throughout Apraxic Ataxic Sensation diminished to light touch bilateral lower extremities  Psychiatric:        Mood and Affect: Affect is blunt.        Speech: Speech is delayed.        Lab Results Last 24 Hours       Results for orders placed or performed during the hospital encounter of 07/15/20 (from the past 24 hour(s))  Urinalysis, Routine w reflex microscopic Urine, Clean Catch     Status: None    Collection Time: 07/15/20 12:16  PM  Result Value Ref Range    Color, Urine YELLOW YELLOW    APPearance CLEAR CLEAR    Specific Gravity, Urine 1.019 1.005 - 1.030    pH 6.0 5.0 - 8.0    Glucose, UA NEGATIVE NEGATIVE mg/dL    Hgb urine dipstick NEGATIVE NEGATIVE    Bilirubin Urine NEGATIVE NEGATIVE    Ketones, ur NEGATIVE NEGATIVE mg/dL    Protein, ur NEGATIVE NEGATIVE mg/dL    Nitrite NEGATIVE NEGATIVE    Leukocytes,Ua NEGATIVE NEGATIVE  Basic metabolic panel     Status: Abnormal    Collection Time: 07/16/20  7:33 AM  Result Value Ref Range    Sodium 139 135 - 145 mmol/L    Potassium 4.0 3.5 - 5.1 mmol/L    Chloride 105 98 - 111 mmol/L    CO2 22 22 - 32 mmol/L    Glucose, Bld 141 (H) 70 - 99 mg/dL    BUN 15 6 - 20 mg/dL    Creatinine, Ser 1.61 0.61 - 1.24 mg/dL    Calcium 09.6 8.9 - 04.5 mg/dL    GFR, Estimated >40 >98 mL/min    Anion gap 12 5  - 15  CBC     Status: None    Collection Time: 07/16/20  7:33 AM  Result Value Ref Range    WBC 10.4 4.0 - 10.5 K/uL    RBC 5.00 4.22 - 5.81 MIL/uL    Hemoglobin 15.5 13.0 - 17.0 g/dL    HCT 11.9 39 - 52 %    MCV 90.4 80.0 - 100.0 fL    MCH 31.0 26.0 - 34.0 pg    MCHC 34.3 30.0 - 36.0 g/dL    RDW 14.7 82.9 - 56.2 %    Platelets 258 150 - 400 K/uL    nRBC 0.0 0.0 - 0.2 %       Imaging Results (Last 48 hours)  MR Brain W and Wo Contrast   Result Date: 07/15/2020 CLINICAL DATA:  Multiple sclerosis, new event; new left leg weakness. EXAM: MRI HEAD WITHOUT AND WITH CONTRAST TECHNIQUE: Multiplanar, multiecho pulse sequences of the brain and surrounding structures were obtained without and with intravenous contrast. CONTRAST:  5.56mL GADAVIST GADOBUTROL 1 MMOL/ML IV SOLN COMPARISON:  Brain MRI 05/19/2020. FINDINGS: Brain: Mild intermittent motion degradation. This includes mild motion degradation of the axial T1 weighted postcontrast sequence. There are numerous subcentimeter enhancing lesions within the juxtacortical and subcortical white matter of the bilateral cerebral hemispheres (at least 15-20 in number) (see annotations on series 32, image 34). The majority of, if not all of, these enhancing lesions appear new as compared to the prior examination of 05/19/2020. As before, there is background severe multifocal T2/FLAIR hyperintense signal abnormality within the subcortical/juxtacortical and deep cerebral white matter, corpus callosum, thalami, brainstem, cerebellum and middle cerebellar peduncles. No evidence of intracranial mass. No chronic intracranial blood products. No extra-axial fluid collection. No midline shift. Vascular: Expected proximal arterial flow voids. Skull and upper cervical spine: No focal marrow lesion. Sinuses/Orbits: Visualized orbits show no acute finding. Trace scattered paranasal sinus mucosal thickening. No significant mastoid effusion. IMPRESSION: Mildly motion degraded  examination. Numerous (numbering at least 15-20) subcentimeter enhancing lesions within the juxtacortical and subcortical white matter of the bilateral cerebral hemispheres. The majority of, if not all of, these enhancing lesions appear new as compared to the MRI of 05/19/2020 and are consistent with sites of active demyelination. As before, there is background severe multifocal T2 hyperintense signal abnormality within  the cerebral white matter, corpus callosum, thalami, brainstem, cerebellum and middle cerebellar peduncles. These findings are consistent with the provided history of multiple sclerosis. Electronically Signed   By: Jackey Loge DO   On: 07/15/2020 15:09    MR Cervical Spine W or Wo Contrast   Result Date: 07/15/2020 CLINICAL DATA:  Multiple sclerosis, new event. Additional provided: Weakness, difficulty walking. EXAM: MRI CERVICAL SPINE WITHOUT AND WITH CONTRAST TECHNIQUE: Multiplanar and multiecho pulse sequences of the cervical spine, to include the craniocervical junction and cervicothoracic junction, were obtained without and with intravenous contrast. CONTRAST:  5.50mL GADAVIST GADOBUTROL 1 MMOL/ML IV SOLN COMPARISON:  Cervical spine MRI 12/11/2019. FINDINGS: The examination is intermittently motion degraded, limiting evaluation. Most notably, there is moderate motion degradation of the axial T2 GRE sequence, moderate motion degradation of the axial T2 weighted sequence and moderate/severe motion degradation of the postcontrast axial T1 weighted sequence. Alignment: Straightening of the expected cervical lordosis. No significant spondylolisthesis. Vertebrae: Vertebral body height is maintained. No focal suspicious osseous lesion or significant marrow edema. Cord: Similar to the prior MRI of 12/11/2019, there is extensive multifocal T2/STIR hyperintense signal abnormality throughout the majority of the cervical spinal cord. Within the limitations of motion degradation, no abnormal cord  enhancement is identified to suggest active demyelination. There is associated mild diffuse atrophy of the cervical spinal cord. Posterior Fossa, vertebral arteries, paraspinal tissues: Posterior fossa better assessed on the concurrently performed brain MRI. Flow voids preserved within the imaged cervical vertebral arteries. Paraspinal soft tissues within normal limits. Disc levels: Intervertebral disc height is maintained. No significant disc herniation, spinal canal stenosis or neural foraminal narrowing at any level. IMPRESSION: Motion degraded examination as described and limiting evaluation. Similar to the prior MRI of 12/11/2019, there is extensive multifocal T2 hyperintense signal abnormality affecting the majority of the cervical spinal cord. There is associated mild diffuse cord atrophy and these findings are consistent with the provided history of multiple sclerosis. Within the limitations of motion degradation, no abnormal enhancement of the cervical spinal cord is identified to suggest active demyelination. Electronically Signed   By: Jackey Loge DO   On: 07/15/2020 15:19    MR THORACIC SPINE W WO CONTRAST   Result Date: 07/15/2020 CLINICAL DATA:  Demyelinating disease; MS flare suspected, new left leg weakness. EXAM: MRI THORACIC WITHOUT AND WITH CONTRAST TECHNIQUE: Multiplanar and multiecho pulse sequences of the thoracic spine were obtained without and with intravenous contrast. CONTRAST:  5.74mL GADAVIST GADOBUTROL 1 MMOL/ML IV SOLN COMPARISON:  Thoracic spine MRI 12/16/2019. FINDINGS: MRI THORACIC SPINE FINDINGS Alignment:  No significant spondylolisthesis. Vertebrae: Vertebral body height is maintained. No suspicious osseous lesion or significant marrow edema. Cord: Again demonstrated is extensive multifocal T2/STIR hyperintense signal abnormality throughout the majority of the thoracic spinal cord. There are several lesions which are new or significantly progressed as compared to the prior  examination of 12/16/2019 as follows. A lesion within the central cord at the T4 level (series 23, image 10). A lesion within the right dorsal lateral aspect of the cord at the T5 level (series 23, image 13). A lesion within the right aspect of the spinal cord at the T6 level (series 23, image 16). A lesion within the central cord at the T7-T8 level (series 23, image 19). A lesion within the central cord at the T11 level (series 23, image 30). Faint contrast enhancement is questioned at the sites of the T4 and T5 level lesions and this reflect active demyelination (series 26, image 11).  No definite abnormal enhancement is identified elsewhere within the thoracic cord. Paraspinal and other soft tissues: No abnormality identified within included portions of the thorax or upper abdomen. Disc levels: At T4-T5, there is unchanged tiny central disc protrusion which partially effaces the ventral thecal sac and contacts the ventral spinal cord. No significant central canal stenosis. At T6-T7, there is an unchanged small central disc protrusion which partially effaces the ventral thecal sac and contacts the ventral spinal cord. No significant spinal canal stenosis. No significant foraminal narrowing at any level. IMPRESSION: Extensive multifocal T2 hyperintense signal abnormality affecting the majority of the thoracic spinal cord. There are T2 hyperintense lesions at the T4, T5, T6, T7-T8 and T11 levels which are new or significantly progressed as compared to the MRI of 12/16/2019. These findings are consistent with the provided history of demyelinating disease/multiple sclerosis. There is questionable enhancement corresponding with the T4 and T5 level lesions and this may reflect active demyelination. Electronically Signed   By: Jackey Loge DO   On: 07/15/2020 15:38       Assessment/Plan: Diagnosis: Multiple sclerosis exacerbation Labs independently reviewed.  Records reviewed and summated above.   1. Does the need  for close, 24 hr/day medical supervision in concert with the patient's rehab needs make it unreasonable for this patient to be served in a less intensive setting? Potentially  2. Co-Morbidities requiring supervision/potential complications:  mild autism, hypotension (monitor with increased exertion), multiple sclerosis, steroid induced hyperglycemia (SSI, Monitor in accordance with exercise and adjust meds as necessary) 3. Due to bladder management, disease management, medication administration and patient education, does the patient require 24 hr/day rehab nursing? Potentially 4. Does the patient require coordinated care of a physician, rehab nurse, therapy disciplines of PT/OT to address physical and functional deficits in the context of the above medical diagnosis(es)? Potentially Addressing deficits in the following areas: balance, endurance, locomotion, strength, transferring, bowel/bladder control, bathing, dressing, toileting and psychosocial support 5. Can the patient actively participate in an intensive therapy program of at least 3 hrs of therapy per day at least 5 days per week? Yes 6. The potential for patient to make measurable gains while on inpatient rehab is excellent and good 7. Anticipated functional outcomes upon discharge from inpatient rehab are supervision  with PT, supervision with OT, modified independent with SLP. 8. Estimated rehab length of stay to reach the above functional goals is: 4-6 days. 9. Anticipated discharge destination: Home 10. Overall Rehab/Functional Prognosis: excellent   RECOMMENDATIONS: This patient's condition is appropriate for continued rehabilitative care in the following setting: Patient doing relatively well on day of evaluation.  We will continue to monitor with completion of steroids and consider CIR patient does not progress to supervision level of functioning. Patient has agreed to participate in recommended program. Yes Note that insurance prior  authorization may be required for reimbursement for recommended care.   Comment: Rehab Admissions Coordinator to follow up.   I have personally performed a face to face diagnostic evaluation, including, but not limited to relevant history and physical exam findings, of this patient and developed relevant assessment and plan.  Additionally, I have reviewed and concur with the physician assistant's documentation above.    Maryla Morrow, MD, ABPMR [facility] Rossetti Angiulli, PA-C 07/16/2020

## 2020-07-21 NOTE — Progress Notes (Signed)
PMR Admission Coordinator Pre-Admission Assessment  Patient: Jaime [name] is an 23 y.o., male MRN: [id number] DOB: 04/20/1996 Height:   Weight: 53.6 kg              Insurance Information HMO:     PPO:      PCP:      IPA:      80/20:      OTHER:  PRIMARY: Medicare [place] [facility]       Policy#: [id number]      Subscriber:Patient  CM Name:       Phone#:      Fax#:  Pre-Cert#:       Employer:  Benefits:  Phone #:      Name:  Eff. Date:      Deduct:       Out of Pocket Max:       Life Max:   CIR: Covered per Medicaid guidelines  Outpatient:      Co-Pay:  Home Health:       Co-Pay:  DME:      Co-Pay:  Providers: in network  SECONDARY: none   Financial Counselor:     Phone#:   The "Data Collection Information Summary" for patients in Inpatient Rehabilitation Facilities with attached "Privacy Act Statement-Health Care Records" was provided and verbally reviewed with: N/A  Emergency Contact Information Contact Information    Name Relation Home Work Mobile   [place],Dawn Mother [phone]  [phone]   [place], Alex Brother   [phone]     Current Medical History  Patient Admitting Diagnosis: MS exacerbation  History of Present Illness: : Jaime [name] is a 23-year-old right-handed male history of mild autism, hypertension recently diagnosed multiple sclerosis March 2021 followed by neurology services Dr. Sater.  Patient well-known to rehab services from multiple sclerosis exacerbations 12/17/2019 to 12/26/2019 as well as 05/24/2020 to 06/05/2020.  He was discharged home ambulating contact-guard assist.  He lives with his mother and 16-year-old and 12-year-old siblings.  Two-level home bed and bath upstairs.  Presented 07/15/2020 with relapsing multiple sclerosis weakness left greater than right.  MRI of the brain showed numerous subcentimeter enhancing lesions within the juxtacortical subcortical white matter of the bilateral cerebral hemispheres.  Placed on intravenous  Solu-Medrol x5 doses.  Subcutaneous Lovenox for DVT prophylaxis.  ProAmatine ongoing for history of orthostasis.  Tolerating a regular diet.  Therapy evaluations completed and patient was admitted for a comprehensive rehab program.  Past Medical History  Past Medical History:  Diagnosis Date  . Autism   . Depression   . MS (multiple sclerosis) (HCC)     Family History  family history includes Clotting disorder in his mother.  Prior Rehab/Hospitalizations:  Has the patient had prior rehab or hospitalizations prior to admission? Yes  Has the patient had major surgery during 100 days prior to admission? No  Current Medications   Current Facility-Administered Medications:  .  enoxaparin (LOVENOX) injection 40 mg, 40 mg, Subcutaneous, Q24H, Sun, Richard, MD, 40 mg at 07/17/20 1914 .  melatonin tablet 6 mg, 6 mg, Oral, QHS, Brimage, Vondra, DO .  methylPREDNISolone sodium succinate (SOLU-MEDROL) 1,000 mg in sodium chloride 0.9 % 50 mL IVPB, 1,000 mg, Intravenous, Q24H, Sun, Richard, MD, Last Rate: 58 mL/hr at 07/17/20 1530, 1,000 mg at 07/17/20 1530 .  midodrine (PROAMATINE) tablet 7.5 mg, 7.5 mg, Oral, TID WC, Sun, Richard, MD, 7.5 mg at 07/18/20 0907 .  pantoprazole (PROTONIX) EC tablet 40 mg, 40 mg, Oral, Daily, Sun, Richard, MD, 40   mg at 07/18/20 0907 .  [START ON 07/19/2020] polyethylene glycol (MIRALAX / GLYCOLAX) packet 17 g, 17 g, Oral, Daily, Brimage, Vondra, DO .  senna (SENOKOT) tablet 8.6 mg, 1 tablet, Oral, Daily, Brimage, Vondra, DO  Patients Current Diet:  Diet Order            Diet regular Room service appropriate? Yes; Fluid consistency: Thin  Diet effective now                 Precautions / Restrictions Precautions Precautions: Fall Restrictions Weight Bearing Restrictions: No   Has the patient had 2 or more falls or a fall with injury in the past year?No  Prior Activity Level Pt. Was going out 1-2x a week for appointments, was not working PTA.   Prior  Functional Level Prior Function Level of Independence: Needs assistance Gait / Transfers Assistance Needed: pt reports he has been ambulating with use of a walker recently. Fell on 10/8 attempting to get to the bathroom. Pt's mother has been assisting with stairs and ADLs  Self Care: Did the patient need help bathing, dressing, using the toilet or eating?  Independent  Indoor Mobility: Did the patient need assistance with walking from room to room (with or without device)? Independent  Stairs: Did the patient need assistance with internal or external stairs (with or without device)? Independent  Functional Cognition: Did the patient need help planning regular tasks such as shopping or remembering to take medications? Dependent  Home Assistive Devices / Equipment Home Assistive Devices/Equipment: Eyeglasses, Walker (specify type) Home Equipment: Walker - 2 wheels  Prior Device Use: Indicate devices/aids used by the patient prior to current illness, exacerbation or injury? None of the above  Current Functional Level Cognition  Overall Cognitive Status: History of cognitive impairments - at baseline Orientation Level: Oriented X4 General Comments: pt with high functioning autism, follows commands well and good awarness of situation but requires increased time for problem sovling     Extremity Assessment (includes Sensation/Coordination)  Upper Extremity Assessment: RUE deficits/detail, LUE deficits/detail RUE Deficits / Details: grossly 4-/5. bilateral UE tremous RUE Sensation: WNL RUE Coordination: decreased gross motor, decreased fine motor LUE Deficits / Details: grossly 4-/5 LUE Sensation: WNL LUE Coordination: decreased fine motor, decreased gross motor  Lower Extremity Assessment: Defer to PT evaluation RLE Deficits / Details: grossly 4+/5 RLE Sensation: decreased light touch LLE Deficits / Details: L knee flexion/extension 4/5, PF/DF 3/5 LLE Sensation: decreased light  touch LLE Coordination: decreased gross motor    ADLs  Overall ADL's : Needs assistance/impaired Eating/Feeding Details (indicate cue type and reason): reports using L UE more than R due to tremors--would benefit from weighted utensils  Grooming: Minimal assistance, Standing Grooming Details (indicate cue type and reason): to engage in standing tasks at sink, declined grooming tasks but simulated requring min assist for balance (pt even leaning against sink)  Upper Body Bathing: Minimal assistance, Sitting Lower Body Bathing: Moderate assistance, Sit to/from stand, Sitting/lateral leans Upper Body Dressing : Minimal assistance, Sitting Lower Body Dressing: Moderate assistance, Sit to/from stand Lower Body Dressing Details (indicate cue type and reason): able to adjust socks, relies on BUE support standing  Toilet Transfer: Minimal assistance, Ambulation, RW Toilet Transfer Details (indicate cue type and reason): simulated to recliner  Toileting- Clothing Manipulation and Hygiene: Maximal assistance, Sit to/from stand Toileting - Clothing Manipulation Details (indicate cue type and reason): to manage underwear due to decreased standing balance Functional mobility during ADLs: Minimal assistance, Rolling walker, Cueing for   safety, Cueing for sequencing General ADL Comments: patient requiring increased assistance for self care due to decreased balance, activity tolerance, safety awareness    Mobility  Overal bed mobility: Needs Assistance Bed Mobility: Supine to Sit Supine to sit: Supervision, HOB elevated Sit to supine: Supervision General bed mobility comments: HOB elevated, increased time but no physical assist required    Transfers  Overall transfer level: Needs assistance Equipment used: Rolling walker (2 wheeled) Transfers: Sit to/from Stand Sit to Stand: Min assist General transfer comment: min assist to power up and steady, cueing for hand placement/safety with RW      Ambulation / Gait / Stairs / Wheelchair Mobility  Ambulation/Gait Ambulation/Gait assistance: Min assist Gait Distance (Feet): 10 Feet Assistive device: Rolling walker (2 wheeled) Gait Pattern/deviations: Ataxic, Trunk flexed (exaggerated hip and knee flexion) General Gait Details: Pt gait appears uncoordinated with exaggerated hip and knee flexion. Mildly unsteady with min A to correct. Cues for postural control and drawing attention to pt's gait deviations. Gait velocity: reduced Gait velocity interpretation: <1.31 ft/sec, indicative of household ambulator    Posture / Balance Dynamic Sitting Balance Sitting balance - Comments: min guard to close supervision for safety  Balance Overall balance assessment: Needs assistance Sitting-balance support: No upper extremity supported, Feet supported Sitting balance-Leahy Scale: Fair Sitting balance - Comments: min guard to close supervision for safety  Postural control: Posterior lean Standing balance support: Bilateral upper extremity supported, During functional activity (or trunk support leaning on sink) Standing balance-Leahy Scale: Poor Standing balance comment: relies on BUE and external support    Special needs/care consideration  None     Previous Home Environment (from acute therapy documentation) Living Arrangements: Parent, Other relatives Available Help at Discharge: Family, Available 24 hours/day Type of Home: Apartment Home Layout: Two level, Bed/bath upstairs Alternate Level Stairs-Rails: Right Alternate Level Stairs-Number of Steps: full flight Home Access: Stairs to enter Entrance Stairs-Rails: Can reach both Entrance Stairs-Number of Steps: 2 without rail at back entrance, 4 wiht bil rail at front entrance, per pt Bathroom Shower/Tub: Tub/shower unit Bathroom Toilet: Standard Bathroom Accessibility: Yes Home Care Services: No  Discharge Living Setting Plans for Discharge Living Setting: Patient's home Type of  Home at Discharge: Apartment Discharge Home Layout: Two level Alternate Level Stairs-Rails: Right Alternate Level Stairs-Number of Steps: full flight Discharge Home Access: Stairs to enter Entrance Stairs-Rails: None Entrance Stairs-Number of Steps:  (2) Discharge Bathroom Shower/Tub: Tub/shower unit Discharge Bathroom Toilet: Standard Discharge Bathroom Accessibility: Yes Does the patient have any problems obtaining your medications?: No  Social/Family/Support Systems Patient Roles: Parent Contact Information: [phone] Anticipated Caregiver: Dawn Mcdaniel  Anticipated Caregiver's Contact Information: [phone] Ability/Limitations of Caregiver:  (none ) Caregiver Availability: 24/7 Discharge Plan Discussed with Primary Caregiver: Yes Is Caregiver In Agreement with Plan?: Yes   Goals Patient/Family Goal for Rehab: PT/OT Mod I  Expected length of stay: 5-7 days Pt/Family Agrees to Admission and willing to participate: Yes Program Orientation Provided & Reviewed with Pt/Caregiver Including Roles  & Responsibilities: Yes   Decrease burden of Care through IP rehab admission: Specialzed equipment needs, Diet advancement, Decrease number of caregivers, Patient/family education   Possible need for SNF placement upon discharge: Not anticipated   Patient Condition: This patient's medical and functional status has changed since the consult dated: 07/16/2020 in which the Rehabilitation Physician determined and documented that the patient's condition is appropriate for intensive rehabilitative care in an inpatient rehabilitation facility. See "History of Present Illness" (above) for medical update. Functional changes   are:Pt. Ambulating 10 ft with min assist. Patient's medical and functional status update has been discussed with the Rehabilitation physician and patient remains appropriate for inpatient rehabilitation. Will admit to inpatient rehab today.  Preadmission Screen Completed  By:  Laura B Staley, CCC-SLP, 07/18/2020 2:28 PM ______________________________________________________________________   Discussed status with Dr. Kirsteins on 07/18/20 at 1430 and received approval for admission tomorrow.  Admission Coordinator:  Laura B Staley, time 1430 /Date 07/18/2020    

## 2020-07-21 NOTE — Progress Notes (Signed)
Occupational Therapy Session Note  Patient Details  Name: Jaime [name] MRN: [id number] Date of Birth: 13-Jan-1996  Today's Date: 07/21/2020 OT Individual Time: 6283-1517 and 1401-1511 OT Individual Time Calculation (min): 56 min and 70 min   Short Term Goals: Week 1:  OT Short Term Goal 1 (Week 1): STGS=LTGS due to ELOS  Skilled Therapeutic Interventions/Progress Updates:    Pt greeted at time of session sitting up in wheelchair with urgency needing to use the bathroom, brought via wheelchair to/from toilet and SPT with grab bar CGA, all toileting tasks in same manner. Pt had very loose BM, extended time to finish toileting and cleaning. Washed periarea and buttocks with CGA in standing, donned new underwear and pants with CGA. Set up at sink level for oral care with supervision. Pt brought to gym via wheelchair for time management and performed SCIFIT on level 4 for 8 minutes to improve endurance and strength. Once back in room, alarm on and call bell in reach.   Session 2: Pt greeted at time of session sitting up in wheelchair agreeable to OT session, on phone with family member. NT reporting low BP, checked and 91/56, ok for therapy session. Pt needed to use the bathroom beforehand, CGA all aspects ambulating short distance to bathroom and transfer to/from commode all with RW and CGA. Hygiene and clothing management in same manner. Transported to gym via wheelchair for time management and SPT Nustep with CGA with RW, level 7 for 10 minutes without fatigue. C/o dizziness, BP 105/65. Note pt wearing thigh high TEDS and abdominal binder throughout session. 3 rounds of 2 minute intervals at [employer] with increasing complexity for remembering directions and use of BUEs for GM/FMC tasks. BP checked throughout session, remained WNL. Once transported back to room via wheelchair, time spent fixing chair alarm. Ended session with pt sitting up in wheelchair alarm on, call bell in reach, all needs met.    Therapy Documentation Precautions:  Precautions Precautions: Fall Precaution Comments: Orthostatic hypotension and elevated HR with activity, ataxia Restrictions Weight Bearing Restrictions: No     Therapy/Group: Individual Therapy  Viona Gilmore 07/21/2020, 12:59 PM

## 2020-07-21 NOTE — Progress Notes (Signed)
Inpatient Rehabilitation Center Individual Statement of Services  Patient Name:  RUSTYN CONERY  Date:  07/21/2020  Welcome to the Inpatient Rehabilitation Center.  Our goal is to provide you with an individualized program based on your diagnosis and situation, designed to meet your specific needs.  With this comprehensive rehabilitation program, you will be expected to participate in at least 3 hours of rehabilitation therapies Monday-Friday, with modified therapy programming on the weekends.  Your rehabilitation program will include the following services:  Physical Therapy (PT), Occupational Therapy (OT), Speech Therapy (ST), 24 hour per day rehabilitation nursing, Therapeutic Recreaction (TR), Neuropsychology, Care Coordinator, Rehabilitation Medicine, Nutrition Services, Pharmacy Services and Other  Weekly team conferences will be held on Tuesday to discuss your progress.  Your Inpatient Rehabilitation Care Coordinator will talk with you frequently to get your input and to update you on team discussions.  Team conferences with you and your family in attendance may also be held.  Expected length of stay: 5-7 Days  Overall anticipated outcome: MOD I  Depending on your progress and recovery, your program may change. Your Inpatient Rehabilitation Care Coordinator will coordinate services and will keep you informed of any changes. Your Inpatient Rehabilitation Care Coordinator's name and contact numbers are listed  below.  The following services may also be recommended but are not provided by the Inpatient Rehabilitation Center:    Home Health Rehabiltiation Services  Outpatient Rehabilitation Services    Arrangements will be made to provide these services after discharge if needed.  Arrangements include referral to agencies that provide these services.  Your insurance has been verified to be:  Medicaid [place] primary doctor is:  Delbert Harness, MD  Pertinent  information will be shared with your doctor and your insurance company.  Inpatient Rehabilitation Care Coordinator:  Lavera Guise, [place] [phone] or [phone]  Information discussed with and copy given to patient by: Andria Rhein, 07/21/2020, 11:53 AM

## 2020-07-21 NOTE — Progress Notes (Signed)
[facility] PHYSICAL MEDICINE & REHABILITATION PROGRESS NOTE   Subjective/Complaints: +constipation Denies pain Sleeping well Feels therapy is going well.   ROS: denies chest pain shortness of breath nausea vomiting or diarrhea, positive hiccups intractable  Objective:   No results found. Recent Labs    07/19/20 1642 07/21/20 0610  WBC 18.0* 13.2*  HGB 14.3 15.1  HCT 41.4 43.0  PLT 305 273   Recent Labs    07/19/20 1642 07/21/20 0610  NA  --  139  K  --  3.3*  CL  --  103  CO2  --  26  GLUCOSE  --  89  BUN  --  18  CREATININE 0.95 0.83  CALCIUM  --  9.1    Intake/Output Summary (Last 24 hours) at 07/21/2020 1157 Last data filed at 07/21/2020 0500 Gross per 24 hour  Intake --  Output 425 ml  Net -425 ml        Physical Exam: Vital Signs Blood pressure 118/70, pulse 66, temperature 98.4 F (36.9 C), temperature source Oral, resp. rate 19, SpO2 98 %.   General: Alert and oriented x 3, No apparent distress HEENT: Head is normocephalic, atraumatic, PERRLA, EOMI, sclera anicteric, oral mucosa pink and moist, dentition intact, ext ear canals clear,  Neck: Supple without JVD or lymphadenopathy Heart: Reg rate and rhythm. No murmurs rubs or gallops Chest: CTA bilaterally without wheezes, rales, or rhonchi; no distress Abdomen: Soft, non-tender, non-distended, bowel sounds positive. Extremities: No clubbing, cyanosis, or edema. Pulses are 2+ Skin: No evidence of breakdown, no evidence of rash Neurologic: Cranial nerves II through XII intact, motor strength is 4/5 in bilateral deltoid, bicep, tricep, grip, hip flexor, knee extensors, ankle dorsiflexor and plantar flexor Sensory exam normal sensation to light touch and proprioception in bilateral upper and lower extremities Cerebellar exam dysmetria right greater than left finger to nose to finger as well as heel to shin in bilateral upper and lower extremities Musculoskeletal: Full range of motion in all 4  extremities. No joint swelling    Assessment/Plan: 1. Functional deficits secondary to MS exacerbation which require 3+ hours per day of interdisciplinary therapy in a comprehensive inpatient rehab setting.  Physiatrist is providing close team supervision and 24 hour management of active medical problems listed below.  Physiatrist and rehab team continue to assess barriers to discharge/monitor patient progress toward functional and medical goals  Care Tool:  Bathing    Body parts bathed by patient: Right arm, Left arm, Chest, Abdomen, Front perineal area, Buttocks, Face, Right upper leg, Left upper leg, Left lower leg, Right lower leg         Bathing assist Assist Level: Contact Guard/Touching assist     Upper Body Dressing/Undressing Upper body dressing   What is the patient wearing?: Pull over shirt    Upper body assist Assist Level: Set up assist    Lower Body Dressing/Undressing Lower body dressing      What is the patient wearing?: Pants, Underwear/pull up     Lower body assist Assist for lower body dressing: Contact Guard/Touching assist     Toileting Toileting    Toileting assist Assist for toileting: Contact Guard/Touching assist Assistive Device Comment: urinal.   Transfers Chair/bed transfer  Transfers assist     Chair/bed transfer assist level: Minimal Assistance - Patient > 75%     Locomotion Ambulation   Ambulation assist      Assist level: Minimal Assistance - Patient > 75% Assistive device: Walker-rolling Max distance:  3 ft   Walk 10 feet activity   Assist  Walk 10 feet activity did not occur: Safety/medical concerns (evaluation limited by nausea and OH)        Walk 50 feet activity   Assist Walk 50 feet with 2 turns activity did not occur: Safety/medical concerns (evaluation limited by nausea and OH)         Walk 150 feet activity   Assist Walk 150 feet activity did not occur: Safety/medical concerns (evaluation  limited by nausea and OH)         Walk 10 feet on uneven surface  activity   Assist Walk 10 feet on uneven surfaces activity did not occur: Safety/medical concerns (evaluation limited by nausea and OH)         Wheelchair     Assist Will patient use wheelchair at discharge?: No (Per PT long term goals ) Type of Wheelchair: Manual    Wheelchair assist level: Supervision/Verbal cueing Max wheelchair distance: >150 ft    Wheelchair 50 feet with 2 turns activity    Assist        Assist Level: Supervision/Verbal cueing   Wheelchair 150 feet activity     Assist      Assist Level: Supervision/Verbal cueing   Blood pressure 118/70, pulse 66, temperature 98.4 F (36.9 C), temperature source Oral, resp. rate 19, SpO2 98 %.  Medical Problem List and Plan: 1.Decreased functional abilitysecondary to multiple sclerosis exacerbation. IV Solu-Medrol x5 completed. Follow-up outpatient Dr. Epimenio Foot -patient may shower -ELOS/Goals: 5-7  Continue CIR 2. Antithrombotics: -DVT/anticoagulation:Subcutaneous Lovenox -antiplatelet therapy: N/A 3. Pain Management:Tylenol as needed. Well controlled.  4. Mood/mild autism:Patient is cognitively at baseline. Melatonin 6 mg nightly as needed -antipsychotic agents: N/A 5. Neuropsych: This patientiscapable of making decisions on hisown behalf. 6. Skin/Wound Care:Routine skin checks 7. Fluids/Electrolytes/Nutrition:Routine in and outs with follow-up chemistries Good urine Output, not incont 8. Orthostasis. ProAmatine 7.5 mg 3 times daily. Monitor with increased mobility BPs running on the high side, will try reducing to 5 mg 3 times daily and check orthostatic vitals daily 9. Constipation. MiraLAX as needed, no BM recorded since rehab admit yesterday   10.  Leukocytosis- afebrile, likely due to steroids, trending downward 11.  Intractable hiccups, likely MS related,  will trial baclofen 12. Hypokalemia: K+ 3.3 today Supplemented with BID today and repeat BMP tomorrow LOS: 2 days A FACE TO FACE EVALUATION WAS PERFORMED  Clint Bolder P Kaytlynn Kochan 07/21/2020, 11:57 AM

## 2020-07-21 NOTE — Progress Notes (Signed)
° °  Patient Details  Name: Jaime [name] MRN: [id number] Date of Birth: Nov 23, 1995  Today's Date: 07/21/2020  Hospital Problems: Principal Problem:   Multiple sclerosis exacerbation [facility])  Past Medical History:  Past Medical History:  Diagnosis Date   Autism    Depression    MS (multiple sclerosis) (HCC)    Past Surgical History:  Past Surgical History:  Procedure Laterality Date   NO PAST SURGERIES     Social History:  reports that he has never smoked. He has never used smokeless tobacco. He reports that he does not drink alcohol and does not use drugs.  Family / Support Systems Marital Status: Single Other Supports: Soil scientist (Mother), Scientific laboratory technician (brother) Anticipated Caregiver: Dawn (Mother) Ability/Limitations of Caregiver: None Caregiver Availability: 24/7  Social History Preferred language: English Religion:  Read: Yes Write: Yes Guardian/Conservator: Regina Eck   Abuse/Neglect Abuse/Neglect Assessment Can Be Completed: Yes Physical Abuse: Denies Verbal Abuse: Denies Sexual Abuse: Denies Exploitation of patient/patient's resources: Denies Self-Neglect: Denies  Emotional Status Pt's affect, behavior and adjustment status: no Recent Psychosocial Issues: no Psychiatric History: no Substance Abuse History: no  Patient / Family Perceptions, Expectations & Goals Pt/Family understanding of illness & functional limitations: mother is understanding currently Premorbid pt/family roles/activities: Pt going out 1-2x a week into commmunity. ambulating with rw (mothers assist with ADL and stairs) Anticipated changes in roles/activities/participation: Mother to continue asisst Pt/family expectations/goals: Mod I  Building surveyor: None Premorbid Home Care/DME Agencies: Other (Comment) Adult nurse) Transportation available at discharge: Family able to transport Resource referrals recommended: Neuropsychology  Discharge Planning Living  Arrangements: Parent, Other relatives Support Systems: Parent, Other relatives Type of Residence: Private residence (2 steps w/o railings (back door), 4 steps with railings (front door)) Insurance Resources: Medicaid (specify county) Medical sales representative) Living Expenses: Lives with family Money Management: Family, Patient Does the patient have any problems obtaining your medications?: No Care Coordinator Anticipated Follow Up Needs: HH/OP Expected length of stay: 5-7 Days  Clinical Impression SW entered room, introduced self explained role. Patient called mother at beside, sw provided same information. Sw will continue to follow up.  Andria Rhein 07/21/2020, 1:13 PM

## 2020-07-21 NOTE — Progress Notes (Signed)
Physical Therapy Session Note  Patient Details  Name: Jaime [name] MRN: [id number] Date of Birth: [birth year]  Today's Date: 07/21/2020 PT Individual Time: 3500-9381 PT Individual Time Calculation (min): 64 min   Short Term Goals: Week 1:  PT Short Term Goal 1 (Week 1): STG=LTG due to short ELOS.  Skilled Therapeutic Interventions/Progress Updates:    Patient in bed total A to don THT and abdominal binder.  Supine to sit with HOB up with S.  Sitting BP 104/65; HR 76.  Patient sitting mod I for eating breakfast with fair sitting tolerance, mild c/o dizziness.  Sit to stand with RW and min A.  Standing BP 99/50, HR 118 and pt symptomatic.  PAtient ambulated with RW to bathroom with min A and CGA for balance while doffing pants, donning pants and sit to stand.  Propelled w/c to gym with S increased time with veering due to L UE weakness more than R and some ataxia.  Patient negotiated 4 steps with 2 rails with min A x 3 reps.  Ambulation in gym x 35' w/ RW and min A BP 82/58, HR 122 seated after ambulation.  Patient assisted to w/c stand step with RW and min A and back to room due to time constraints.  Left seated in w/c with seatbelt alarm and call bell in reach, RN to obtain box for belt alarm.  Therapy Documentation Precautions:  Precautions Precautions: Fall Precaution Comments: Orthostatic hypotension and elevated HR with activity, ataxia Restrictions Weight Bearing Restrictions: No  Pain: Pain Assessment Pain Scale: 0-10 Pain Score: 0-No pain    Therapy/Group: Individual Therapy  [name]  [name], PT 07/21/2020, 8:45 AM

## 2020-07-22 ENCOUNTER — Inpatient Hospital Stay (HOSPITAL_COMMUNITY): Payer: Medicaid Other | Admitting: Occupational Therapy

## 2020-07-22 ENCOUNTER — Inpatient Hospital Stay (HOSPITAL_COMMUNITY): Payer: Medicaid Other

## 2020-07-22 LAB — BASIC METABOLIC PANEL
Anion gap: 9 (ref 5–15)
BUN: 18 mg/dL (ref 6–20)
CO2: 28 mmol/L (ref 22–32)
Calcium: 8.8 mg/dL — ABNORMAL LOW (ref 8.9–10.3)
Chloride: 102 mmol/L (ref 98–111)
Creatinine, Ser: 0.76 mg/dL (ref 0.61–1.24)
GFR, Estimated: >60 mL/min (ref >60)
Glucose, Bld: 87 mg/dL (ref 70–99)
Potassium: 3.1 mmol/L — ABNORMAL LOW (ref 3.5–5.1)
Sodium: 139 mmol/L (ref 135–145)

## 2020-07-22 MED ORDER — POTASSIUM CHLORIDE 20 MEQ PO PACK: 40.0000 meq | PACK | Freq: Three times a day (TID) | ORAL | Status: DC

## 2020-07-22 MED ORDER — MIDODRINE HCL 5 MG PO TABS
10.0000 mg | ORAL_TABLET | Freq: Two times a day (BID) | ORAL | Status: DC
  Administered 2020-07-22 – 2020-07-26 (×9): 10 mg via ORAL
  Filled 2020-07-22 (×9): qty 2

## 2020-07-22 MED ORDER — SERTRALINE HCL 50 MG PO TABS
25.0000 mg | ORAL_TABLET | Freq: Every day | ORAL | Status: DC
  Administered 2020-07-22 – 2020-07-25 (×4): 25 mg via ORAL
  Filled 2020-07-22 (×4): qty 1

## 2020-07-22 MED ORDER — MIDODRINE HCL 5 MG PO TABS: 10.0000 mg | ORAL_TABLET | Freq: Two times a day (BID) | ORAL | Status: DC

## 2020-07-22 MED ORDER — POTASSIUM CHLORIDE CRYS ER 20 MEQ PO TBCR
40.0000 meq | EXTENDED_RELEASE_TABLET | Freq: Two times a day (BID) | ORAL | Status: DC
  Administered 2020-07-22 – 2020-07-23 (×3): 40 meq via ORAL
  Filled 2020-07-22 (×3): qty 2

## 2020-07-22 NOTE — IPOC Note (Signed)
Overall Plan of Care [facility]) Patient Details Name: Jaime [name] MRN: [id number] DOB: 10/29/1995  Admitting Diagnosis: Multiple sclerosis exacerbation [facility])  Hospital Problems: Principal Problem:   Multiple sclerosis exacerbation (HCC)     Functional Problem List: Nursing Bladder, Bowel, Endurance  PT Balance, Safety, Endurance, Motor, Nutrition  OT Balance, Motor, Endurance, Safety  SLP    TR         Basic ADL's: OT Grooming, Bathing, Dressing, Toileting     Advanced  ADL's: OT Simple Meal Preparation     Transfers: PT Bed Mobility, Bed to Chair, Car, Furniture, Floor  OT Toilet, Tub/Shower     Locomotion: PT Ambulation, Psychologist, prison and probation services, Stairs     Additional Impairments: OT Fuctional Use of Upper Extremity  SLP        TR      Anticipated Outcomes Item Anticipated Outcome  Self Feeding no goal  Swallowing      Basic self-care  Supervision  Toileting  Supervision   Bathroom Transfers CGA  Bowel/Bladder  to be continent x 2  Transfers  mod I using LRAD  Locomotion  Supervision uisng LRAD >150 ft  Communication     Cognition     Pain  less than 3 out of 10 on pain scale  Safety/Judgment  to remain free from falls while in rehab   Therapy Plan: PT Intensity: Minimum of 1-2 x/day ,45 to 90 minutes PT Frequency: 5 out of 7 days PT Duration Estimated Length of Stay: 5-7 OT Intensity: Minimum of 1-2 x/day, 45 to 90 minutes OT Frequency: 5 out of 7 days OT Duration/Estimated Length of Stay: 5-7 days     Due to the current state of emergency, patients may not be receiving their 3-hours of Medicare-mandated therapy.   Team Interventions: Nursing Interventions Patient/Family Education, Disease Management/Prevention, Skin Care/Wound Management, Discharge Planning, Psychosocial Support, Bowel Management, Bladder Management, Medication Management  PT interventions Ambulation/gait training, Discharge planning, DME/adaptive equipment instruction,  Functional mobility training, Pain management, Psychosocial support, Splinting/orthotics, Therapeutic Activities, UE/LE Strength taining/ROM, Visual/perceptual remediation/compensation, Warden/ranger, Community reintegration, Neuromuscular re-education, Equities trader education, Museum/gallery curator, Therapeutic Exercise, UE/LE Coordination activities, Wheelchair propulsion/positioning  OT Interventions Warden/ranger, Discharge planning, Self Care/advanced ADL retraining, Therapeutic Activities, Functional mobility training, Patient/family education, Therapeutic Exercise, Visual/perceptual remediation/compensation, UE/LE Coordination activities, Disease mangement/prevention, Wheelchair propulsion/positioning, UE/LE Strength taining/ROM, Psychosocial support, Neuromuscular re-education, DME/adaptive equipment instruction, Community reintegration  SLP Interventions    TR Interventions    SW/CM Interventions Discharge Planning, Psychosocial Support, Patient/Family Education   Barriers to Discharge MD  Medical stability  Nursing Incontinence    PT Home environment access/layout, Lack of/limited family support Patient has multilevel home with 4 STE with B rails and a full flight with R rail to access bed/bath; patient's mother is only adult caregiver available at d/c, also cares for patient's younger siblings  OT Inaccessible home environment Bed/bathroom upstairs, bathroom is not walker accessible per pt and he fell at home when using the restroom without device  SLP      SW       Team Discharge Planning: Destination: PT-Home ,OT- Home , SLP-  Projected Follow-up: PT-Outpatient PT, OT-  Other (comment) (TBD), SLP-  Projected Equipment Needs: PT-To be determined, OT- To be determined, SLP-  Equipment Details: PT-has RW from previous admission, OT-  Patient/family involved in discharge planning: PT- Patient,  OT-Patient, SLP-   MD ELOS: 5-7 days  Medical Rehab Prognosis:   Excellent Assessment: Jaime [name] is a 24 year old man  who was admitted to CIR with decreased functional abilitysecondary to multiple sclerosis exacerbation. IV Solu-Medrol x5 completed. His pain has been well controlled. His constipation is being treated with a bowel program. BP has been orthostatic despite TED hose and abdominal binder- increased dose of Midodrine. Monitor BP TID and orthostatic vital signs daily. Baclofen was started for hiccups with good effect. K+ has been low with supplement initiated and is currently being checked daily. WBC is trending downward.    See Team Conference Notes for weekly updates to the plan of care

## 2020-07-22 NOTE — Progress Notes (Signed)
Physical Therapy Session Note  Patient Details  Name: Jaime [name] MRN: [id number] Date of Birth: Feb 11, 1996  Today's Date: 07/22/2020 PT Individual Time: 1110-1207 PT Individual Time Calculation (min): 57 min   Short Term Goals: Week 1:  PT Short Term Goal 1 (Week 1): STG=LTG due to short ELOS.  Skilled Therapeutic Interventions/Progress Updates:    Patient received sitting up in wc agreeable to PT. He denies pain at this time. He does request additional time to complete breakfast. UE ataxia noted when bringing food > mouth. Patient able to propel wc using B LE to therapy gym. NuStep x10 mins level 5 completed for LE coordination/reciprocal movements. Patient requesting to return to room to use restroom. He was able to ambulate into bathroom using RW and CGA to transfer to toilet. He was able to have a bowl movement. Peri hygiene completed independently. Patient ambulating back to wc to return to therapy gym. Dynamic standing balance completed with UE functional task. No LOB noted, but poor endurance for standing was observed. Patient returning to room in wc, seatbelt alarm on, call light within reach.   Therapy Documentation Precautions:  Precautions Precautions: Fall Precaution Comments: Orthostatic hypotension and elevated HR with activity, ataxia Restrictions Weight Bearing Restrictions: No   Therapy/Group: Individual Therapy  Westley Foots 07/22/2020, 7:41 AM

## 2020-07-22 NOTE — Telephone Encounter (Signed)
I called pt's mother, per DPR. She still has not come by the office to finish the tysabri start form for pt. She reports that she will come by this week.

## 2020-07-22 NOTE — Progress Notes (Signed)
Occupational Therapy Session Note  Patient Details  Name: Jaime [name] MRN: [id number] Date of Birth: [birth year]  Today's Date: 07/22/2020 OT Individual Time: 1100-1155 OT Individual Time Calculation (min): 55 min    Short Term Goals: Week 1:  OT Short Term Goal 1 (Week 1): STGS=LTGS due to ELOS  Skilled Therapeutic Interventions/Progress Updates:    1:1 Pt was received in the w/c. Ambulated to the bathroom with RW with min A. Pt voided but urine had spilled on the floor between [facility] and commode. Assisted with pt with changing soiled TEDS and socks. Pt ambulated to the dayroom with RW with significant hip flexion (steppage) and heavy reliance on RW. Pt performed 5 min on Nustep on level 6 resistance. Pt transferred into regular standard chair and performed sit to stands without UE support challenging balance and endurance. Transitioned into standing and challenging dynamic standing balance with kicking a yoga block (alternating feet). Pt with more difficulty sustaining balance hen weight shifting to left foot and kicking with right foot- requiring A to maintain balance, kicking block with left foot required only contact guard.  After rest break ambulated back to room without steppage and a more normal pattern in ambulation. Transitioned into recliner. Left with all needs within reach.     Therapy Documentation Precautions:  Precautions Precautions: Fall Precaution Comments: Orthostatic hypotension and elevated HR with activity, ataxia Restrictions Weight Bearing Restrictions: No General:   Vital Signs: Therapy Vitals Temp: (!) 97.5 F (36.4 C) Temp Source: Oral Pulse Rate: (!) 101 Resp: 18 BP: 101/60 Patient Position (if appropriate): Sitting Oxygen Therapy SpO2: 98 % O2 Device: Room Air Pain: No c/o pain  Therapy/Group: Individual Therapy  Roney Mans [facility] 07/22/2020, 3:58 PM

## 2020-07-22 NOTE — Progress Notes (Signed)
Patient ID: Jaime [name], male   DOB: 04/30/1996, 24 y.o.   MRN: [id number] Team Conference Report to Patient/Family  Team Conference discussion was reviewed with the patient and caregiver, including goals, any changes in plan of care and target discharge date.  Patient and caregiver express understanding and are in agreement.  The patient has a target discharge date of 07/26/20.  Andria Rhein 07/22/2020, 12:53 PM

## 2020-07-22 NOTE — Progress Notes (Signed)
Occupational Therapy Session Note  Patient Details  Name: Jaime [name] MRN: [id number] Date of Birth: 04-Jul-1996  Today's Date: 07/22/2020 OT Individual Time: 3536-1443 OT Individual Time Calculation (min): 70 min    Short Term Goals: Week 1:  OT Short Term Goal 1 (Week 1): STGS=LTGS due to ELOS  Skilled Therapeutic Interventions/Progress Updates:    Pt greeted at time of session reclined in bed resting but agreeable to OT session, supine to sit supervision and ambulated to bathroom min guard with RW, 3/3 toileting tasks CGA and transfer to/from toilet with min/guard. After toileting, pt attempted to ambulate to shower with pants around knee level, educated that he needs to have pants pulled up before walking to decrease fall risk. Ambulate short distance to shower level with Min guard with RW and cue to fully turn prior to sitting. Transfer to shower bench CGA, performed UB/LB bathing at shower level with CS with brief stands for washing buttocks. Hand held assist walking short distance to toilet level where he performed UB/LB dressing seated with set up with UB dress for pull over shirt and abdominal binder, LB dress CGA for pants and underwear. Ambulated short distance back to wheelchair with Min guard with RW. Set up for eating breakfast, attempted to locate weight spoon but unable to locate. Trialed 1.5# wrist weight to decrease tremors with self feeding, no difference between weight/no weight noted this date but pt states that in the past he has benefitted from weighted spoon/utensils. Alarm on, call bell in reach.   Therapy Documentation Precautions:  Precautions Precautions: Fall Precaution Comments: Orthostatic hypotension and elevated HR with activity, ataxia Restrictions Weight Bearing Restrictions: No    Therapy/Group: Individual Therapy  Erasmo Score 07/22/2020, 10:11 AM

## 2020-07-22 NOTE — Progress Notes (Addendum)
[facility] PHYSICAL MEDICINE & REHABILITATION PROGRESS NOTE   Subjective/Complaints: Had BM yesterday K+ did not improve despite supplementation. Discussed with nursing, he may not be taking all of liquid supplement- will switch to pill form  ROS: denies chest pain shortness of breath nausea vomiting or diarrhea, positive hiccups intractable  Objective:   No results found. Recent Labs    07/19/20 1642 07/21/20 0610  WBC 18.0* 13.2*  HGB 14.3 15.1  HCT 41.4 43.0  PLT 305 273   Recent Labs    07/21/20 0610 07/22/20 0623  NA 139 139  K 3.3* 3.1*  CL 103 102  CO2 26 28  GLUCOSE 89 87  BUN 18 18  CREATININE 0.83 0.76  CALCIUM 9.1 8.8*    Intake/Output Summary (Last 24 hours) at 07/22/2020 0929 Last data filed at 07/22/2020 0850 Gross per 24 hour  Intake 840 ml  Output 925 ml  Net -85 ml        Physical Exam: Vital Signs Blood pressure 100/71, pulse 78, temperature 98.5 F (36.9 C), resp. rate 19, height 5\' 9"  (1.753 m), weight 53.8 kg, SpO2 100 %. General: Alert and oriented x 3, No apparent distress HEENT: Head is normocephalic, atraumatic, PERRLA, EOMI, sclera anicteric, oral mucosa pink and moist, dentition intact, ext ear canals clear,  Neck: Supple without JVD or lymphadenopathy Heart: Reg rate and rhythm. No murmurs rubs or gallops Chest: CTA bilaterally without wheezes, rales, or rhonchi; no distress Abdomen: Soft, non-tender, non-distended, bowel sounds positive.  Neurologic: Cranial nerves II through XII intact, motor strength is 4/5 in bilateral deltoid, bicep, tricep, grip, hip flexor, knee extensors, ankle dorsiflexor and plantar flexor Sensory exam normal sensation to light touch and proprioception in bilateral upper and lower extremities Cerebellar exam dysmetria right greater than left finger to nose to finger as well as heel to shin in bilateral upper and lower extremities Musculoskeletal: Full range of motion in all 4 extremities. No joint  swelling    Assessment/Plan: 1. Functional deficits secondary to MS exacerbation which require 3+ hours per day of interdisciplinary therapy in a comprehensive inpatient rehab setting.  Physiatrist is providing close team supervision and 24 hour management of active medical problems listed below.  Physiatrist and rehab team continue to assess barriers to discharge/monitor patient progress toward functional and medical goals  Care Tool:  Bathing    Body parts bathed by patient: Right arm, Left arm, Chest, Abdomen, Front perineal area, Buttocks, Face, Right upper leg, Left upper leg, Left lower leg, Right lower leg         Bathing assist Assist Level: Contact Guard/Touching assist     Upper Body Dressing/Undressing Upper body dressing   What is the patient wearing?: Pull over shirt    Upper body assist Assist Level: Set up assist    Lower Body Dressing/Undressing Lower body dressing      What is the patient wearing?: Pants, Underwear/pull up     Lower body assist Assist for lower body dressing: Contact Guard/Touching assist     Toileting Toileting    Toileting assist Assist for toileting: Contact Guard/Touching assist Assistive Device Comment: urinal.   Transfers Chair/bed transfer  Transfers assist     Chair/bed transfer assist level: Minimal Assistance - Patient > 75%     Locomotion Ambulation   Ambulation assist      Assist level: Minimal Assistance - Patient > 75% Assistive device: Walker-rolling Max distance: 35   Walk 10 feet activity   Assist  Walk  10 feet activity did not occur: Safety/medical concerns (evaluation limited by nausea and OH)  Assist level: Contact Guard/Touching assist Assistive device: Walker-rolling   Walk 50 feet activity   Assist Walk 50 feet with 2 turns activity did not occur: Safety/medical concerns         Walk 150 feet activity   Assist Walk 150 feet activity did not occur: Safety/medical concerns          Walk 10 feet on uneven surface  activity   Assist Walk 10 feet on uneven surfaces activity did not occur: Safety/medical concerns (evaluation limited by nausea and OH)         Wheelchair     Assist Will patient use wheelchair at discharge?: No Type of Wheelchair: Manual    Wheelchair assist level: Supervision/Verbal cueing Max wheelchair distance: >150 ft    Wheelchair 50 feet with 2 turns activity    Assist        Assist Level: Supervision/Verbal cueing   Wheelchair 150 feet activity     Assist      Assist Level: Supervision/Verbal cueing   Blood pressure 100/71, pulse 78, temperature 98.5 F (36.9 C), resp. rate 19, height 5\' 9"  (1.753 m), weight 53.8 kg, SpO2 100 %.  Medical Problem List and Plan: 1.Decreased functional abilitysecondary to multiple sclerosis exacerbation. IV Solu-Medrol x5 completed. Follow-up outpatient Dr. -patient may shower -ELOS/Goals: 5-7  Continue CIR  -Interdisciplinary Team Conference today  2. Antithrombotics: -DVT/anticoagulation:Subcutaneous Lovenox -antiplatelet therapy: N/A 3. Pain Management:Tylenol as needed. Well controlled 4. Mood/mild autism:Patient is cognitively at baseline. Melatonin 6 mg nightly as needed -antipsychotic agents: N/A 5. Neuropsych: This patientiscapable of making decisions on hisown behalf. 6. Skin/Wound Care:Routine skin checks 7. Fluids/Electrolytes/Nutrition:Routine in and outs with follow-up chemistries Good urine Output, not incont 8. Orthostasis. ProAmatine 7.5 mg 3 times daily. Monitor with increased mobility BPs running on the high side, will try reducing to 5 mg 3 times daily and check orthostatic vitals daily  10/19: symptomatic orthostasis with therapy. Increase midodrine to 10mg  twice per day at 8 and 12 9. Constipation. MiraLAX as needed, no BM recorded since rehab admit yesterday   10.   Leukocytosis- afebrile, likely due to steroids, trending downward 11.  Intractable hiccups, likely MS related, will trial baclofen. Resolved.  12. Hypokalemia: K+ 3.1 today. Change supplement to kdur and repeat tomorrow morning.   >35 minutes spent in discussing patient's care with therapists, reviewing notes, updating mother, and checking potassium, supplementing, and repeating tomorrow, discussing orthostasis and increasing midodrine dosing  LOS: 3 days A FACE TO FACE EVALUATION WAS PERFORMED  11/19 P Jamie-Lee Galdamez 07/22/2020, 9:29 AM

## 2020-07-22 NOTE — Progress Notes (Signed)
Pt refused Orthostatic VS.

## 2020-07-22 NOTE — Patient Care Conference (Signed)
Inpatient RehabilitationTeam Conference and Plan of Care Update Date: 07/22/2020   Time: 11:41 AM    Patient Name: Jaime [name]      Medical Record Number: [id number]  Date of Birth: 02-02-1996 Sex: Male         Room/Bed: 4W06C/4W06C-01 Payor Info: Payor: MEDICAID [place] / Plan: MEDICAID [place] ACCESS / Product Type: *No Product type* /    Admit Date/Time:  07/19/2020  4:07 PM  Primary Diagnosis:  Multiple sclerosis exacerbation [facility])  Hospital Problems: Principal Problem:   Multiple sclerosis exacerbation [facility]    Expected Discharge Date: Expected Discharge Date: 07/26/20  Team Members Present: Physician leading conference: Dr. Sula Soda Care Coodinator Present: Kennyth Arnold, RN, BSN, CRRN;Christina Vita Barley, BSW Nurse Present: Adam Phenix, LPN PT Present: Casimiro Needle, PT OT Present: Earleen Newport, OT PPS Coordinator present : Edson Snowball, Park Breed, SLP     Current Status/Progress Goal Weekly Team Focus  Bowel/Bladder   Pt is continent of B/B. LBM  07/21/20  normal elimination pattern  Q2h toileting/PRN   Swallow/Nutrition/ Hydration             ADL's   CGA ADL transfers with RW, toileting/bathing/dressing all CGA, low BP/dizziness  Supervision - CGA home with mom  standing balance, functional mobility during ADLs with RW, endurance, family ed?   Mobility   Gait with RW about 107' w/ THT and binder, BP 82/58, HR 122, stairs x 4 min A with rails x 3 separate trials  Supervision except stairs CGA  activity/upright tolerance, focus, balance, coordination, stairs   Communication             Safety/Cognition/ Behavioral Observations            Pain   Pt denies having pain at this time  remain pain free  Assess pain Qshift/PRN   Skin   NO obvious signs of skin breakdown at this time  no new skin issues  Assess skin Qshift/PRN     Discharge Planning:  Discharging home with mother to provide care. Avaliable 24/7. 3 Steps to enter, no railings    Team Discussion: Continent of B/B, complains of blurred vision. OT close to baseline, patient can don ab binder, needs assist to don TED's. PT contact guard assist with supervision goals. Patient is currently min assist with stairs using handrails. He has 3 steps to enter home with no handrails. Patient on target to meet rehab goals: yes  *See Care Plan and progress notes for long and short-term goals.   Revisions to Treatment Plan:  None noted  Teaching Needs: Continue family education with mother.  Current Barriers to Discharge: Hypokalemia, orthostatic, and constipation.  Possible Resolutions to Barriers: Continue to supplement K+, continue with Ab binder, and MD to increase Midodrine to 10mg  BID, continue bowel program.     Medical Summary Current Status: Had BM 10/19, hypokalemic to 3.1 despite supplementation, orthostasis    Barriers to Discharge Comments: hypokalemic to 3.1 despite supplementation, constipation, orthostasis despite teds and binder Possible Resolutions to 11/19 Focus: change supplement to Kdur, repeat BMP tomorrow morning, continue bowel program, orthostatic vital signs daily, increase midodrine dose to 10mg  twice per day   Continued Need for Acute Rehabilitation Level of Care: The patient requires daily medical management by a physician with specialized training in physical medicine and rehabilitation for the following reasons: Direction of a multidisciplinary physical rehabilitation program to maximize functional independence : Yes Medical management of patient stability for increased activity during participation in an  intensive rehabilitation regime.: Yes Analysis of laboratory values and/or radiology reports with any subsequent need for medication adjustment and/or medical intervention. : Yes   I attest that I was present, lead the team conference, and concur with the assessment and plan of the team.   Tennis Must 07/22/2020, 5:30 PM

## 2020-07-23 ENCOUNTER — Inpatient Hospital Stay (HOSPITAL_COMMUNITY): Payer: Medicaid Other | Admitting: Occupational Therapy

## 2020-07-23 ENCOUNTER — Inpatient Hospital Stay (HOSPITAL_COMMUNITY): Payer: Medicaid Other | Admitting: Physical Therapy

## 2020-07-23 ENCOUNTER — Inpatient Hospital Stay (HOSPITAL_COMMUNITY): Payer: Medicaid Other

## 2020-07-23 LAB — BASIC METABOLIC PANEL
Anion gap: 9 (ref 5–15)
BUN: 15 mg/dL (ref 6–20)
CO2: 25 mmol/L (ref 22–32)
Calcium: 8.9 mg/dL (ref 8.9–10.3)
Chloride: 103 mmol/L (ref 98–111)
Creatinine, Ser: 0.83 mg/dL (ref 0.61–1.24)
GFR, Estimated: >60 mL/min (ref >60)
Glucose, Bld: 89 mg/dL (ref 70–99)
Potassium: 4 mmol/L (ref 3.5–5.1)
Sodium: 137 mmol/L (ref 135–145)

## 2020-07-23 NOTE — Progress Notes (Signed)
[facility] PHYSICAL MEDICINE & REHABILITATION PROGRESS NOTE   Subjective/Complaints: Had incontinent BM this morning. K+ normalized this morning- will stop supplement.  BMP otherwise stable today.   ROS: denies chest pain shortness of breath nausea vomiting or diarrhea, positive hiccups intractable  Objective:   No results found. Recent Labs    07/21/20 0610  WBC 13.2*  HGB 15.1  HCT 43.0  PLT 273   Recent Labs    07/22/20 0623 07/23/20 0612  NA 139 137  K 3.1* 4.0  CL 102 103  CO2 28 25  GLUCOSE 87 89  BUN 18 15  CREATININE 0.76 0.83  CALCIUM 8.8* 8.9    Intake/Output Summary (Last 24 hours) at 07/23/2020 1407 Last data filed at 07/23/2020 1100 Gross per 24 hour  Intake 558 ml  Output 900 ml  Net -342 ml        Physical Exam: Vital Signs Blood pressure 109/64, pulse 92, temperature 98.1 F (36.7 C), resp. rate 18, height 5\' 9"  (1.753 m), weight 53.8 kg, SpO2 100 %.  General: Alert and oriented x 3, No apparent distress HEENT: Head is normocephalic, atraumatic, PERRLA, EOMI, sclera anicteric, oral mucosa pink and moist, dentition intact, ext ear canals clear,  Neck: Supple without JVD or lymphadenopathy Heart: Reg rate and rhythm. No murmurs rubs or gallops Chest: CTA bilaterally without wheezes, rales, or rhonchi; no distress Abdomen: Soft, non-tender, non-distended, bowel sounds positive. Extremities: No clubbing, cyanosis, or edema. Pulses are 2+ Skin: Clean and intact without signs of breakdown  Neurologic: Cranial nerves II through XII intact, motor strength is 4/5 in bilateral deltoid, bicep, tricep, grip, hip flexor, knee extensors, ankle dorsiflexor and plantar flexor Sensory exam normal sensation to light touch and proprioception in bilateral upper and lower extremities Cerebellar exam dysmetria right greater than left finger to nose to finger as well as heel to shin in bilateral upper and lower extremities Musculoskeletal: Full range of motion  in all 4 extremities. No joint swelling    Assessment/Plan: 1. Functional deficits secondary to MS exacerbation which require 3+ hours per day of interdisciplinary therapy in a comprehensive inpatient rehab setting.  Physiatrist is providing close team supervision and 24 hour management of active medical problems listed below.  Physiatrist and rehab team continue to assess barriers to discharge/monitor patient progress toward functional and medical goals  Care Tool:  Bathing    Body parts bathed by patient: Right arm, Left arm, Chest, Abdomen, Front perineal area, Buttocks, Face, Right upper leg, Left upper leg, Left lower leg, Right lower leg         Bathing assist Assist Level: Supervision/Verbal cueing     Upper Body Dressing/Undressing Upper body dressing   What is the patient wearing?: Pull over shirt    Upper body assist Assist Level: Set up assist    Lower Body Dressing/Undressing Lower body dressing      What is the patient wearing?: Pants, Underwear/pull up     Lower body assist Assist for lower body dressing: Contact Guard/Touching assist     Toileting Toileting    Toileting assist Assist for toileting: Contact Guard/Touching assist Assistive Device Comment: urinal.   Transfers Chair/bed transfer  Transfers assist     Chair/bed transfer assist level: Contact Guard/Touching assist     Locomotion Ambulation   Ambulation assist      Assist level: Contact Guard/Touching assist Assistive device: Walker-rolling Max distance: 10   Walk 10 feet activity   Assist  Walk 10 feet activity  did not occur: Safety/medical concerns (evaluation limited by nausea and OH)  Assist level: Contact Guard/Touching assist Assistive device: Walker-rolling   Walk 50 feet activity   Assist Walk 50 feet with 2 turns activity did not occur: Safety/medical concerns         Walk 150 feet activity   Assist Walk 150 feet activity did not occur:  Safety/medical concerns         Walk 10 feet on uneven surface  activity   Assist Walk 10 feet on uneven surfaces activity did not occur: Safety/medical concerns (evaluation limited by nausea and OH)         Wheelchair     Assist Will patient use wheelchair at discharge?: No Type of Wheelchair: Manual    Wheelchair assist level: Supervision/Verbal cueing Max wheelchair distance: >150 ft    Wheelchair 50 feet with 2 turns activity    Assist        Assist Level: Supervision/Verbal cueing   Wheelchair 150 feet activity     Assist      Assist Level: Supervision/Verbal cueing   Blood pressure 109/64, pulse 92, temperature 98.1 F (36.7 C), resp. rate 18, height 5\' 9"  (1.753 m), weight 53.8 kg, SpO2 100 %.  Medical Problem List and Plan: 1.Decreased functional abilitysecondary to multiple sclerosis exacerbation. IV Solu-Medrol x5 completed. Follow-up outpatient Dr. -patient may shower -ELOS/Goals: 5-7  Continue CIR 2. Antithrombotics: -DVT/anticoagulation:Subcutaneous Lovenox -antiplatelet therapy: N/A 3. Pain Management:Tylenol as needed. Well controlled.  4. Mood/mild autism:Patient is cognitively at baseline. Melatonin 6 mg nightly as needed -antipsychotic agents: N/A 5. Neuropsych: This patientiscapable of making decisions on hisown behalf. 6. Skin/Wound Care:Routine skin checks 7. Fluids/Electrolytes/Nutrition:Routine in and outs with follow-up chemistries Good urine Output, not incont 8. Orthostasis. ProAmatine 7.5 mg 3 times daily. Monitor with increased mobility BPs running on the high side, will try reducing to 5 mg 3 times daily and check orthostatic vitals daily  10/19: symptomatic orthostasis with therapy. Increase midodrine to 10mg  twice per day at 8 and 12  10/20: appears to be better controlled with increase in dose.  9. Constipation. MiraLAX as needed, no BM  recorded since rehab admit yesterday   10.  Leukocytosis- afebrile, likely due to steroids, trending downward 11.  Intractable hiccups, likely MS related, will trial baclofen. Resolved.  12. Hypokalemia: K+ 3.1 today. Change supplement to kdur and repeat tomorrow morning.   10/20: K+ is well controlled.   LOS: 4 days A FACE TO FACE EVALUATION WAS PERFORMED  11/20 P Osborn Pullin 07/23/2020, 2:07 PM

## 2020-07-23 NOTE — Progress Notes (Signed)
Physical Therapy Session Note  Patient Details  Name: Jaime [name] MRN: [id number] Date of Birth: Nov 30, 1995  Today's Date: 07/23/2020 PT Individual Time: 1402-1502 PT Individual Time Calculation (min): 60 min   Short Term Goals: Week 1:  PT Short Term Goal 1 (Week 1): STG=LTG due to short ELOS.  Skilled Therapeutic Interventions/Progress Updates:   Patient in recliner in room finished lunch. Already with TEDS and binder on, tightened binder.  Reports minimal symptoms of light headedness today.  Performed sit to stand to RW with CGA.  Ambulated x 180' to therapy gym with CGA with RW.  Patient negotiated 8 steps with bilateral rails and CGA.  Standing balance assessment as noted below, 22/56; educated in fall risk reduction for home.  Patient propelled with bilat UE/LE in w/c to dayroom with S.  Performed NuStep as noted below.  Patient rested and able to ambulate back to room another 100' with CGA with RW.  Left seated in recliner with alarm belt activated and needs in reach.   Therapy Documentation Precautions:  Precautions Precautions: Fall Precaution Comments: Orthostatic hypotension and elevated HR with activity, ataxia Restrictions Weight Bearing Restrictions: No Pain: Pain Assessment Pain Score: 0-No pain     Balance: Standardized Balance Assessment Standardized Balance Assessment: Berg Balance Test Berg Balance Test Sit to Stand: Able to stand  independently using hands Standing Unsupported: Able to stand 2 minutes with supervision Sitting with Back Unsupported but Feet Supported on Floor or Stool: Able to sit safely and securely 2 minutes Stand to Sit: Controls descent by using hands Transfers: Able to transfer with verbal cueing and /or supervision Standing Unsupported with Eyes Closed: Able to stand 3 seconds Standing Ubsupported with Feet Together: Needs help to attain position but able to stand for 30 seconds with feet together From Standing, Reach Forward with  Outstretched Arm: Can reach forward >5 cm safely (2") From Standing Position, Pick up Object from Floor: Unable to try/needs assist to keep balance From Standing Position, Turn to Look Behind Over each Shoulder: Needs supervision when turning Turn 360 Degrees: Needs assistance while turning Standing Unsupported, Alternately Place Feet on Step/Stool: Able to complete >2 steps/needs minimal assist Standing Unsupported, One Foot in Front: Loses balance while stepping or standing Standing on One Leg: Unable to try or needs assist to prevent fall Total Score: 22 Exercises: Cardiovascular Exercises NuStep: 6 minutes level 6 bilat UE/LE    Therapy/Group: Individual Therapy  [name]  [name], PT 07/23/2020, 2:49 PM

## 2020-07-23 NOTE — Progress Notes (Signed)
Physical Therapy Session Note  Patient Details  Name: Jaime [name] MRN: [id number] Date of Birth: December 10, 1995  Today's Date: 07/23/2020 PT Individual Time: 1135-1200 PT Individual Time Calculation (min): 25 min   Short Term Goals: Week 1:  PT Short Term Goal 1 (Week 1): STG=LTG due to short ELOS.  Skilled Therapeutic Interventions/Progress Updates:   Pt received sitting in recliner and agreeable to PT. Pt reports need to urinate. Ambulatory transfer to toilet with CGA assist and RW.   PT instructed pt in Modified [place] level A:  HS curls. X 10  Hip abduction x 8  Sit<>stand x 5 tandem stance 2 x 10 sec hold Mini-squat x 10 BUE support on RW throughout for safety and stability as well as min-CGA from PT for safty due to BUE tremors and mild L lean intermittently.   Patient left sitting in recliner with call bell in reach and all needs met.         Therapy Documentation Precautions:  Precautions Precautions: Fall Precaution Comments: Orthostatic hypotension and elevated HR with activity, ataxia Restrictions Weight Bearing Restrictions: No Pain: denies    Therapy/Group: Individual Therapy  Lorie Phenix 07/23/2020, 1:20 PM

## 2020-07-23 NOTE — Progress Notes (Signed)
Occupational Therapy Session Note  Patient Details  Name: Jaime [name] MRN: [id number] Date of Birth: 05-06-1996  Today's Date: 07/23/2020 OT Individual Time: 0912-1000 OT Individual Time Calculation (min): 48 min    Short Term Goals: Week 1:  OT Short Term Goal 1 (Week 1): STGS=LTGS due to ELOS  Skilled Therapeutic Interventions/Progress Updates:  Patient met lying supine in bed in agreement with OT treatment session. 0/10 pain at rest and with activity. Treatment session with focus on self-care re-education, dynamic standing balance, and ADL transfers. Patient incontinent of bowel upon entry. Assist for hygiene/clothing management. Patient visibly frustrated after episode of bowel incontinence stating "I'm just trying to figure out this bowel." Education on strategies including timed toileting to decrease episodes of incontinence. Will continue to assess needs in subsequent treatment sessions. Bathing at shower level with CGA and cues for safety. Patient able to don UB clothing seated EOB and LB clothing in sititng/standing with CGA and use of RW. Session concluded with patient seated in recliner with call bell within reach, belt alarm activated, and all needs met.   Therapy Documentation Precautions:  Precautions Precautions: Fall Precaution Comments: Orthostatic hypotension and elevated HR with activity, ataxia Restrictions Weight Bearing Restrictions: No General:   Therapy/Group: Individual Therapy  Patsy Varma R [place] 07/23/2020, 7:32 AM

## 2020-07-23 NOTE — Progress Notes (Signed)
Physical Therapy Session Note  Patient Details  Name: Jaime [name] MRN: [id number] Date of Birth: Mar 04, 1996  Today's Date: 07/23/2020 PT Individual Time: 1615-1710 PT Individual Time Calculation (min): 55 min   Short Term Goals: Week 1:  PT Short Term Goal 1 (Week 1): STG=LTG due to short ELOS.  Skilled Therapeutic Interventions/Progress Updates:   Pt received sitting in recliner and agreeable to PT. Pt performed gait training through hall with supervision assist x 176f and cues for increased step width intermittently. Gait training also performed [facility] cement sidewalk [facility] x 725f[facility] supervision assist- CGA. Min cues for AD management with unlevel surface from cracks and with turns   Dynamic standing balance while engaged in wii resort speed slice and sword attack. Min cues for use of UE to improve game control and use of 1 UE on AD to prevent posterior LOB.   Throughout session pt performed all transfers with RW and supervision assist including sit<>stand and stand pivot with UE support on RW. Patient returned to room and performed stand pivot to recliner with RW and supervision assist. Pt left sitting in recliner with call bell in reach and all needs met.        Therapy Documentation Precautions:  Precautions Precautions: Fall Precaution Comments: Orthostatic hypotension and elevated HR with activity, ataxia Restrictions Weight Bearing Restrictions: No Vital Signs: Therapy Vitals Pulse Rate: 92 Resp: 18 BP: 109/64 Patient Position (if appropriate): Sitting Oxygen Therapy SpO2: 100 % O2 Device: Room Air Pain: Pain Assessment Pain Score: 0-No pain    Therapy/Group: Individual Therapy  AuLorie Phenix0/20/2021, 5:14 PM

## 2020-07-24 ENCOUNTER — Inpatient Hospital Stay (HOSPITAL_COMMUNITY): Payer: Medicaid Other | Admitting: Occupational Therapy

## 2020-07-24 ENCOUNTER — Inpatient Hospital Stay (HOSPITAL_COMMUNITY): Payer: Medicaid Other

## 2020-07-24 NOTE — Progress Notes (Signed)
Occupational Therapy Session Note  Patient Details  Name: Jaime [name] MRN: [id number] Date of Birth: October 23, 1995  Today's Date: 07/24/2020 OT Individual Time: 4259-5638 OT Individual Time Calculation (min): 57 min    Short Term Goals: Week 1:  OT Short Term Goal 1 (Week 1): STGS=LTGS due to ELOS   Skilled Therapeutic Interventions/Progress Updates:    Pt greeted at time of session sitting up in recliner agreeable to OT session, declining ADL at this time saying he would shower later with nursing if needed and had on clean clothes, but did need to use the bathroom. No pain thorughout session. BP 100/ 71 with TEDS and binder. Ambulated to/from bathroom with RW with CGA/CS and performed transfer to/from commode and clothing management in the same manner. Extended time on commode for BM. Functional mobility throughout room to/from sink level for hand hygiene with CS/CGA with RW, static standing for hand washing without UE support. Transported via wheelchair to tub shower room and after therapist demonstration, performed tub/shower transfer with Min A/hand held assist (pt states walker will not fit in bathroom), able to step over tub wall as he has a shower seat at home. Dynamic standing with dynavision for 2 rounds of 2 minute intervals with varying levels of commands to remember and bending/reaching to challenge balance, performed with no UE support and alternating unilateral support. Once back in room, ambulated to recliner CGA/CS, alarm on cal lbell in reach.   Therapy Documentation Precautions:  Precautions Precautions: Fall Precaution Comments: Orthostatic hypotension and elevated HR with activity, ataxia Restrictions Weight Bearing Restrictions: No     Therapy/Group: Individual Therapy  Erasmo Score 07/24/2020, 12:22 PM

## 2020-07-24 NOTE — Discharge Summary (Signed)
Physician Discharge Summary  Patient ID: Jaime [name] MRN: [id number] DOB/AGE: 10/11/1995 23 y.o.  Admit date: 07/19/2020 Discharge date:   Discharge Diagnoses:  Principal Problem:   Multiple sclerosis exacerbation (HCC) DVT prophylaxis Orthostasis Constipation Mild autism Intractable hiccups  Discharged Condition: Stable  Significant Diagnostic Studies: MR Brain W and Wo Contrast  Result Date: 07/15/2020 CLINICAL DATA:  Multiple sclerosis, new event; new left leg weakness. EXAM: MRI HEAD WITHOUT AND WITH CONTRAST TECHNIQUE: Multiplanar, multiecho pulse sequences of the brain and surrounding structures were obtained without and with intravenous contrast. CONTRAST:  5.1mL GADAVIST GADOBUTROL 1 MMOL/ML IV SOLN COMPARISON:  Brain MRI 05/19/2020. FINDINGS: Brain: Mild intermittent motion degradation. This includes mild motion degradation of the axial T1 weighted postcontrast sequence. There are numerous subcentimeter enhancing lesions within the juxtacortical and subcortical white matter of the bilateral cerebral hemispheres (at least 15-20 in number) (see annotations on series 32, image 34). The majority of, if not all of, these enhancing lesions appear new as compared to the prior examination of 05/19/2020. As before, there is background severe multifocal T2/FLAIR hyperintense signal abnormality within the subcortical/juxtacortical and deep cerebral white matter, corpus callosum, thalami, brainstem, cerebellum and middle cerebellar peduncles. No evidence of intracranial mass. No chronic intracranial blood products. No extra-axial fluid collection. No midline shift. Vascular: Expected proximal arterial flow voids. Skull and upper cervical spine: No focal marrow lesion. Sinuses/Orbits: Visualized orbits show no acute finding. Trace scattered paranasal sinus mucosal thickening. No significant mastoid effusion. IMPRESSION: Mildly motion degraded examination. Numerous (numbering at least 15-20)  subcentimeter enhancing lesions within the juxtacortical and subcortical white matter of the bilateral cerebral hemispheres. The majority of, if not all of, these enhancing lesions appear new as compared to the MRI of 05/19/2020 and are consistent with sites of active demyelination. As before, there is background severe multifocal T2 hyperintense signal abnormality within the cerebral white matter, corpus callosum, thalami, brainstem, cerebellum and middle cerebellar peduncles. These findings are consistent with the provided history of multiple sclerosis. Electronically Signed   By: Jackey Loge DO   On: 07/15/2020 15:09   MR Cervical Spine W or Wo Contrast  Result Date: 07/15/2020 CLINICAL DATA:  Multiple sclerosis, new event. Additional provided: Weakness, difficulty walking. EXAM: MRI CERVICAL SPINE WITHOUT AND WITH CONTRAST TECHNIQUE: Multiplanar and multiecho pulse sequences of the cervical spine, to include the craniocervical junction and cervicothoracic junction, were obtained without and with intravenous contrast. CONTRAST:  5.41mL GADAVIST GADOBUTROL 1 MMOL/ML IV SOLN COMPARISON:  Cervical spine MRI 12/11/2019. FINDINGS: The examination is intermittently motion degraded, limiting evaluation. Most notably, there is moderate motion degradation of the axial T2 GRE sequence, moderate motion degradation of the axial T2 weighted sequence and moderate/severe motion degradation of the postcontrast axial T1 weighted sequence. Alignment: Straightening of the expected cervical lordosis. No significant spondylolisthesis. Vertebrae: Vertebral body height is maintained. No focal suspicious osseous lesion or significant marrow edema. Cord: Similar to the prior MRI of 12/11/2019, there is extensive multifocal T2/STIR hyperintense signal abnormality throughout the majority of the cervical spinal cord. Within the limitations of motion degradation, no abnormal cord enhancement is identified to suggest active  demyelination. There is associated mild diffuse atrophy of the cervical spinal cord. Posterior Fossa, vertebral arteries, paraspinal tissues: Posterior fossa better assessed on the concurrently performed brain MRI. Flow voids preserved within the imaged cervical vertebral arteries. Paraspinal soft tissues within normal limits. Disc levels: Intervertebral disc height is maintained. No significant disc herniation, spinal canal stenosis or neural foraminal narrowing at  any level. IMPRESSION: Motion degraded examination as described and limiting evaluation. Similar to the prior MRI of 12/11/2019, there is extensive multifocal T2 hyperintense signal abnormality affecting the majority of the cervical spinal cord. There is associated mild diffuse cord atrophy and these findings are consistent with the provided history of multiple sclerosis. Within the limitations of motion degradation, no abnormal enhancement of the cervical spinal cord is identified to suggest active demyelination. Electronically Signed   By: Jackey Loge DO   On: 07/15/2020 15:19   MR THORACIC SPINE W WO CONTRAST  Result Date: 07/15/2020 CLINICAL DATA:  Demyelinating disease; MS flare suspected, new left leg weakness. EXAM: MRI THORACIC WITHOUT AND WITH CONTRAST TECHNIQUE: Multiplanar and multiecho pulse sequences of the thoracic spine were obtained without and with intravenous contrast. CONTRAST:  5.68mL GADAVIST GADOBUTROL 1 MMOL/ML IV SOLN COMPARISON:  Thoracic spine MRI 12/16/2019. FINDINGS: MRI THORACIC SPINE FINDINGS Alignment:  No significant spondylolisthesis. Vertebrae: Vertebral body height is maintained. No suspicious osseous lesion or significant marrow edema. Cord: Again demonstrated is extensive multifocal T2/STIR hyperintense signal abnormality throughout the majority of the thoracic spinal cord. There are several lesions which are new or significantly progressed as compared to the prior examination of 12/16/2019 as follows. A lesion  within the central cord at the T4 level (series 23, image 10). A lesion within the right dorsal lateral aspect of the cord at the T5 level (series 23, image 13). A lesion within the right aspect of the spinal cord at the T6 level (series 23, image 16). A lesion within the central cord at the T7-T8 level (series 23, image 19). A lesion within the central cord at the T11 level (series 23, image 30). Faint contrast enhancement is questioned at the sites of the T4 and T5 level lesions and this reflect active demyelination (series 26, image 11). No definite abnormal enhancement is identified elsewhere within the thoracic cord. Paraspinal and other soft tissues: No abnormality identified within included portions of the thorax or upper abdomen. Disc levels: At T4-T5, there is unchanged tiny central disc protrusion which partially effaces the ventral thecal sac and contacts the ventral spinal cord. No significant central canal stenosis. At T6-T7, there is an unchanged small central disc protrusion which partially effaces the ventral thecal sac and contacts the ventral spinal cord. No significant spinal canal stenosis. No significant foraminal narrowing at any level. IMPRESSION: Extensive multifocal T2 hyperintense signal abnormality affecting the majority of the thoracic spinal cord. There are T2 hyperintense lesions at the T4, T5, T6, T7-T8 and T11 levels which are new or significantly progressed as compared to the MRI of 12/16/2019. These findings are consistent with the provided history of demyelinating disease/multiple sclerosis. There is questionable enhancement corresponding with the T4 and T5 level lesions and this may reflect active demyelination. Electronically Signed   By: Jackey Loge DO   On: 07/15/2020 15:38    Labs:  Basic Metabolic Panel: Recent Labs  Lab 07/19/20 1642 07/21/20 0610 07/22/20 0623 07/23/20 0612  NA  --  139 139 137  K  --  3.3* 3.1* 4.0  CL  --  103 102 103  CO2  --  26 28 25    GLUCOSE  --  89 87 89  BUN  --  18 18 15   CREATININE 0.95 0.83 0.76 0.83  CALCIUM  --  9.1 8.8* 8.9    CBC: Recent Labs  Lab 07/19/20 1642 07/21/20 0610  WBC 18.0* 13.2*  NEUTROABS  --  9.8*  HGB 14.3 15.1  HCT 41.4 43.0  MCV 90.8 90.0  PLT 305 273    CBG: No results for input(s): GLUCAP in the last 168 hours.  Family history.  Mother with clotting disorder as well as hypertension.  Denies any colon cancer esophageal cancer or rectal cancer  Brief HPI:   Jaime [name] is a 24 y.o. right-handed male with history of mild autism, hypertension, recently diagnosed multiple sclerosis March 2021 followed by neurology service Dr. Epimenio Foot.  Well-known to rehab services from prior multiple sclerosis exacerbations 12/17/2019 to 12/26/2019 as well as 05/24/2020 to 06/05/2020.  He was discharged home ambulating contact-guard assist.  He lives with his mother 58 year old and 75 year old siblings.  Two-level home bed and bath upstairs.  Presented 07/15/2020 with relapsing multiple sclerosis weakness left greater than right.  MRI of the brain showed numerous subcentimeter enhancing lesions within the juxtacortical subcortical white matter of the bilateral cerebral hemispheres.  Placed on intravenous Solu-Medrol x5 doses.  Subcutaneous Lovenox for DVT prophylaxis.  ProAmatine ongoing for orthostasis.  Tolerating regular diet.  Therapy evaluations completed and patient was admitted for a comprehensive rehab program.   Hospital Course: Jaime [name] was admitted to rehab 07/19/2020 for inpatient therapies to consist of PT, ST and OT at least three hours five days a week. Past admission physiatrist, therapy team and rehab RN have worked together to provide customized collaborative inpatient rehab.  Pertaining to patient's multiple sclerosis exacerbation he had completed a 5 course of Solu-Medrol he would follow-up outpatient Dr. Epimenio Foot of neurology services.  Subcutaneous Lovenox for DVT prophylaxis no  bleeding episodes.  He was maintained on baclofen for some spasticity 5 mg 3 times daily.  He did have some difficulty in sleeping using melatonin only if needed as well is documented history of mild autism patient at baseline.  Patient did continue on low-dose Zoloft.  Bouts of orthostasis and remained on ProAmatine and would need to be monitored outpatient.  Bouts of constipation resolved with laxative assistance.   Blood pressures were monitored on TID basis and remained soft and monitored      Rehab course: During patient's stay in rehab weekly team conferences were held to monitor patient's progress, set goals and discuss barriers to discharge. At admission, patient required minimal assist 10 feet rolling walker supervision supine to sit minimal assist upper body bathing mod assist lower body bathing minimal assist upper body dressing mod assist lower body dressing  Physical exam.  Blood pressure 115/76 pulse 71 temperature 98.4 respirations 18 oxygen saturations 96% room air Constitutional.  No acute distress HEENT Head.  Normocephalic and atraumatic Eyes.  Pupils round and reactive to light no discharge.nystagmus Neck.  Supple nontender no JVD without thyromegaly Cardiac regular rate rhythm without extra sounds or murmur heard Abdomen.  Soft nontender positive bowel sounds without rebound Respiratory effort normal no respiratory distress without wheeze Skin.  No evidence of breakdown Extremities.  No evidence of clubbing cyanosis or edema Neurologic.  Cranial nerves II through XII intact motor strength 5/5 bilateral deltoids biceps triceps grip hip flexors knee extensors ankle dorsi plantarflexion.  Sensory exam normal sensation to light touch bilateral upper lower extremities.  Finger exam ataxia finger-to-nose to finger as well as heel-to-shin in bilateral upper and lower extremities.  Positive intention tremor.  He/  has had improvement in activity tolerance, balance, postural  control as well as ability to compensate for deficits. He/ has had improvement in functional use RUE/LUE  and RLE/LLE as well as  improvement in awareness.  Patient performed gait training through hallway with supervision assist 120 feet some cues for increased step with intermittently.  Dynamic standing balance while engaged in activities with minimal assist.  Transfers rolling walker supervision.  Gather his belongings for activities day living and homemaking.  He did need some assist for clothing management and hygiene.  Full family teaching completed plan discharge to home       Disposition: Discharge to home    Diet: Regular  Special Instructions: No driving smoking or alcohol  Medications at discharge 1.  Baclofen 5 mg p.o. 3 times daily 2.  Colace 100 mg p.o. 3 times daily 3.  Melatonin 6 mg nightly 4.  ProAmatine 10 mg p.o. twice daily with meals 5.  Zoloft 25 mg nightly 6.Protonix 40 mg daily  30-35 minutes were spent completing discharge summary and discharge planning  Discharge Instructions    Ambulatory referral to Physical Medicine Rehab   Complete by: As directed    Moderate complexity follow-up 2 weeks MS exacerbation       Follow-up Information    Lovorn, Aundra Millet, MD Follow up.   Specialty: Physical Medicine and Rehabilitation Why: Office to call for appointment Contact information: [address] [place] [address] [phone]        Asa Lente, MD Follow up.   Specialty: Neurology Why: Call for appointment Contact information: [address] [place] [address] [phone]               Signed: Charlton Amor 07/25/2020, 5:17 AM

## 2020-07-24 NOTE — Discharge Summary (Signed)
Occupational Therapy Discharge Summary  Patient Details  Name: Jaime [name] MRN: [id number] Date of Birth: 09-Nov-1995  Today's Date: 07/25/2020 OT Individual Time: 1105-1201 OT Individual Time Calculation (min): 56 min    Patient has met 5 of 5 long term goals due to improved activity tolerance, improved balance, ability to compensate for deficits and improved coordination.  Patient to discharge at overall Supervision-CGA level.  [name] has attended CIR family education sessions during pts past stays here and he is d/c at the same functional level as he did previously. We called up Mrs. Delman during session with education provided regarding wearing compression garments (abdominal binder + thigh high Teds) during all OOB mobility, providing HHA for all bathroom transfers, as their bathroom is not walker accessible and pt has a fall hx, also discussed using the [facility] for nighttime toileting to minimize fall risk during toilet transfers. Encouraged Mrs. Walski to attend pts afternoon OT session for hands on family ed if she would feel it would be helpful. She verbalized understanding. She also asked OT about purchasing a treadmill for pt and OT advised to consult f/u therapists before purchasing any therapeutic equipment for home use.  All goals met.   Recommendation:  Patient will benefit from ongoing skilled OT services in home health setting to continue to advance functional skills in the area of BADL and Reduce care partner burden.  Equipment: recommend [facility]  Reasons for discharge: treatment goals met and discharge from hospital  Patient/family agrees with progress made and goals achieved: Yes   Skilled Therapeutic Intervention:  Pt greeted in the recliner, dizziness manageable for tx. Note that he was already wearing his abdominal binder and thigh high Teds. Ambulatory transfer to the shower completed using RW with CGA. Pt doffed clothing at sit<stand level with supervision  given increased time and min cuing. He then bathed at sit<stand level using the grab bars for standing support. Dressing completed sit<stand afterwards from elevated toilet. Pt needing assistance only for Teds. He was able to don abdominal binder himself. He declined brushing his teeth, returned to the recliner per request using RW at ambulatory level with CGA. We called his mother up on the phone to discuss pts PLOF, safety recommendations for home, and invite her to attend pts afternoon OT session if she would like to practice hands on family ed. She verbalized understanding. Pt remained sitting up in the recliner with all needs within reach and safety belt fastened.     OT Discharge Precautions/Restrictions  Precautions Precautions: Fall Precaution Comments: Orthostatic hypotension and elevated HR with activity, ataxia Restrictions Weight Bearing Restrictions: No Vital Signs Therapy Vitals Temp: 98.2 F (36.8 C) Temp Source: Oral Pulse Rate: 78 Resp: 18 BP: 94/67 Patient Position (if appropriate): Sitting Oxygen Therapy SpO2: 99 % O2 Device: Room Air Pain Pain Assessment Pain Scale: 0-10 Pain Score: 0-No pain ADL ADL Eating: Set up Where Assessed-Eating: Bed level Grooming: Setup Upper Body Bathing: Supervision/safety Where Assessed-Upper Body Bathing: Shower Lower Body Bathing: Supervision/safety Where Assessed-Lower Body Bathing: Shower Upper Body Dressing: Setup Where Assessed-Upper Body Dressing: Bed level Lower Body Dressing: Supervision/safety Where Assessed-Lower Body Dressing: Other (Comment), Bed level (CGA doffing clothing sit<stand prior to shower) Toileting: Supervision/safety Where Assessed-Toileting: Glass blower/designer: Therapist, music Method: Counselling psychologist: Energy manager: Minimal assistance (hand held) Clinical cytogeneticist Method: Optometrist: Civil engineer, contracting with back Press photographer: Curator Method: Ambulating (RW) Youth worker: Nurse, learning disability  tub bench, Grab bars Vision Baseline Vision/History: Wears glasses Wears Glasses: At all times Patient Visual Report: No change from baseline Cognition Overall Cognitive Status: Within Functional Limits for tasks assessed Arousal/Alertness: Awake/alert Orientation Level: Oriented X4 Safety/Judgment: Appears intact Sensation Sensation Light Touch: Appears Intact Coordination Gross Motor Movements are Fluid and Coordinated: No Fine Motor Movements are Fluid and Coordinated: No Coordination and Movement Description: UE tremors R>L and ataxia with orthostatic hypotension limiting mobility but overall at CGA/supervision level with RW Motor  Motor Motor: Ataxia;Abnormal postural alignment and control Motor - Discharge Observations: Ataxic and tremulous, limited balance strategies but improved since eval  Finger to Nose: ataxic Rt>Lt, slow and deliberate with improved accuracy since time of eval Mobility  Transfers Sit to Stand: Supervision/Verbal cueing Stand to Sit: Supervision/Verbal cueing  Trunk/Postural Assessment  Cervical Assessment Cervical Assessment: Within Functional Limits Thoracic Assessment Thoracic Assessment: Exceptions to [facility] Lumbar Assessment Lumbar Assessment: Exceptions to [facility] Postural Control Postural Control: Within Functional Limits  Balance Balance Balance Assessed: Yes Static Sitting Balance Static Sitting - Level of Assistance: 6: Modified independent (Device/Increase time) Dynamic Sitting Balance Dynamic Sitting - Balance Support: No upper extremity supported Dynamic Sitting - Level of Assistance: 5: Stand by assistance Static Standing Balance Static Standing - Balance Support: No upper extremity supported;During functional activity Static Standing - Level of Assistance: 5: Stand by assistance Dynamic Standing Balance Dynamic Standing  - Balance Support: During functional activity;Left upper extremity supported Dynamic Standing - Level of Assistance: 5: Stand by assistance Dynamic Standing - Balance Activities: Forward lean/weight shifting;Lateral lean/weight shifting Extremity/Trunk Assessment RUE Assessment RUE Assessment: Exceptions to [facility] Active Range of Motion (AROM) Comments: WNL General Strength Comments: 4 to 4+/5 grossly, tremor activity Rt>Lt LUE Assessment LUE Assessment: Exceptions to [facility] Active Range of Motion (AROM) Comments: WNL General Strength Comments: 4 to 4+/5 grossly, tremor activity   Viona Gilmore 07/24/2020, 5:48 PM

## 2020-07-24 NOTE — Progress Notes (Signed)
[facility] PHYSICAL MEDICINE & REHABILITATION PROGRESS NOTE   Subjective/Complaints: No complaints this morning. Denies pain, constipation, insomnia.  No dizziness or orthostasis with therapy  ROS: denies chest pain shortness of breath nausea vomiting or diarrhea, positive hiccups intractable  Objective:   No results found. No results for input(s): WBC, HGB, HCT, PLT in the last 72 hours. Recent Labs    07/22/20 0623 07/23/20 0612  NA 139 137  K 3.1* 4.0  CL 102 103  CO2 28 25  GLUCOSE 87 89  BUN 18 15  CREATININE 0.76 0.83  CALCIUM 8.8* 8.9    Intake/Output Summary (Last 24 hours) at 07/24/2020 1121 Last data filed at 07/24/2020 0800 Gross per 24 hour  Intake 296 ml  Output 1425 ml  Net -1129 ml        Physical Exam: Vital Signs Blood pressure 99/64, pulse 60, temperature 98.2 F (36.8 C), resp. rate 16, height 5\' 9"  (1.753 m), weight 53.8 kg, SpO2 100 %.   General: Alert and oriented x 3, No apparent distress HEENT: Head is normocephalic, atraumatic, PERRLA, EOMI, sclera anicteric, oral mucosa pink and moist, dentition intact, ext ear canals clear,  Neck: Supple without JVD or lymphadenopathy Heart: Reg rate and rhythm. No murmurs rubs or gallops Chest: CTA bilaterally without wheezes, rales, or rhonchi; no distress Abdomen: Soft, non-tender, non-distended, bowel sounds positive. Extremities: No clubbing, cyanosis, or edema. Pulses are 2+  Neurologic: Cranial nerves II through XII intact, motor strength is 4/5 in bilateral deltoid, bicep, tricep, grip, hip flexor, knee extensors, ankle dorsiflexor and plantar flexor Sensory exam normal sensation to light touch and proprioception in bilateral upper and lower extremities Cerebellar exam dysmetria right greater than left finger to nose to finger as well as heel to shin in bilateral upper and lower extremities Musculoskeletal: Full range of motion in all 4 extremities. No joint  swelling    Assessment/Plan: 1. Functional deficits secondary to MS exacerbation which require 3+ hours per day of interdisciplinary therapy in a comprehensive inpatient rehab setting.  Physiatrist is providing close team supervision and 24 hour management of active medical problems listed below.  Physiatrist and rehab team continue to assess barriers to discharge/monitor patient progress toward functional and medical goals  Care Tool:  Bathing    Body parts bathed by patient: Right arm, Left arm, Chest, Abdomen, Front perineal area, Buttocks, Face, Right upper leg, Left upper leg, Left lower leg, Right lower leg         Bathing assist Assist Level: Supervision/Verbal cueing     Upper Body Dressing/Undressing Upper body dressing   What is the patient wearing?: Pull over shirt    Upper body assist Assist Level: Set up assist    Lower Body Dressing/Undressing Lower body dressing      What is the patient wearing?: Pants, Underwear/pull up     Lower body assist Assist for lower body dressing: Contact Guard/Touching assist     Toileting Toileting    Toileting assist Assist for toileting: Contact Guard/Touching assist Assistive Device Comment: urinal.   Transfers Chair/bed transfer  Transfers assist     Chair/bed transfer assist level: Contact Guard/Touching assist     Locomotion Ambulation   Ambulation assist      Assist level: Contact Guard/Touching assist Assistive device: Walker-rolling Max distance: 150   Walk 10 feet activity   Assist  Walk 10 feet activity did not occur: Safety/medical concerns (evaluation limited by nausea and OH)  Assist level: Contact Guard/Touching assist Assistive  device: Walker-rolling   Walk 50 feet activity   Assist Walk 50 feet with 2 turns activity did not occur: Safety/medical concerns  Assist level: Contact Guard/Touching assist Assistive device: Walker-rolling    Walk 150 feet activity   Assist Walk  150 feet activity did not occur: Safety/medical concerns  Assist level: Contact Guard/Touching assist Assistive device: Walker-rolling    Walk 10 feet on uneven surface  activity   Assist Walk 10 feet on uneven surfaces activity did not occur: Safety/medical concerns (evaluation limited by nausea and OH)         Wheelchair     Assist Will patient use wheelchair at discharge?: No Type of Wheelchair: Manual    Wheelchair assist level: Supervision/Verbal cueing Max wheelchair distance: >150 ft    Wheelchair 50 feet with 2 turns activity    Assist        Assist Level: Supervision/Verbal cueing   Wheelchair 150 feet activity     Assist      Assist Level: Supervision/Verbal cueing   Blood pressure 99/64, pulse 60, temperature 98.2 F (36.8 C), resp. rate 16, height 5\' 9"  (1.753 m), weight 53.8 kg, SpO2 100 %.  Medical Problem List and Plan: 1.Decreased functional abilitysecondary to multiple sclerosis exacerbation. IV Solu-Medrol x5 completed. Follow-up outpatient Dr. -patient may shower -ELOS/Goals: 5-7  Continue CIR 2. Antithrombotics: -DVT/anticoagulation:Subcutaneous Lovenox -antiplatelet therapy: N/A 3. Pain Management:Tylenol as needed. Well controlled 4. Mood/mild autism:Patient is cognitively at baseline. Melatonin 6 mg nightly as needed. Positive mood.  -antipsychotic agents: N/A 5. Neuropsych: This patientiscapable of making decisions on hisown behalf. 6. Skin/Wound Care:Routine skin checks 7. Fluids/Electrolytes/Nutrition:Routine in and outs with follow-up chemistries Good urine Output, not incont 8. Orthostasis. ProAmatine 7.5 mg 3 times daily. Monitor with increased mobility BPs running on the high side, will try reducing to 5 mg 3 times daily and check orthostatic vitals daily  10/19: symptomatic orthostasis with therapy. Increase midodrine to 10mg  twice per day at 8  and 12  10/20: appears to be better controlled with increase in dose.  9. Constipation. MiraLAX as needed, moving bowels regularly  10.  Leukocytosis- afebrile, likely due to steroids, trending downward 11.  Intractable hiccups, likely MS related, will trial baclofen. Resolved.  12. Hypokalemia: K+ 3.1 today. Change supplement to kdur and repeat tomorrow morning.   10/20: K+ is well controlled.   LOS: 5 days A FACE TO FACE EVALUATION WAS PERFORMED  Nellie Pester P Domenique Quest 07/24/2020, 11:21 AM

## 2020-07-24 NOTE — Progress Notes (Signed)
Physical Therapy Session Note  Patient Details  Name: Jaime [name] MRN: [id number] Date of Birth: [birth year]  Today's Date: 07/24/2020 PT Individual Time:Session1: 2119-4174; Session2: 0814-4818 PT Individual Time Calculation (min): 57 min   Short Term Goals: Week 1:  PT Short Term Goal 1 (Week 1): STG=LTG due to short ELOS.  Skilled Therapeutic Interventions/Progress Updates:    Session1:  Patient eating breakfast and needed to delay session so started 30 min after scheduled session.  Patient reports dizziness improved.  Donned clothing bed level and sitting.  Patient able to don TED stockings once initiated, and other clothing with S except binder donned max A.  Sit to stand with S and ambulated to therapy gym x 200' CGA noting R hip flexion throughout and knee hyperextension.  Discussed and practiced floor transfer taking note of injuries, calling for help and coming up from kneeling near sturdy support surface.  Patient reviewed [place] exercises completing 10 reps hamstring curls, hip abduction sit to stand (x5), 1/2 squats with UE support, and tandem stand.  Issued for HEP.  Standing balance tapping feet to cones alternately with UE support, then standing for horseshoe toss no UE support. Patient ambulated back to room with S with RW and left in recliner with needs in reach.  Session2:Patient in recliner, agreeable to session.  Sit to stand with S and ambulated to nursing station with S with RW.  RN informed of going outside so pt resting in w/c.  Pt. Preferred to walk down elevator so ambulated to elevator then into elevator with CGA.  Standing against wall to ride down then exited with CGA for safety and ambulated out door with S with RW.  On uneven terrain with brick walkway and incline pavement with CGA with RW and increased time over 60' then 140'.  Patient in w/c assisted back to gym and sit to stand to work on ball toss to self.  Initially to play cards in standing, but pt preferred  Wii.  So pt propelled w/c to dayroom and attempted to set up to play his chosen game swordplay, but unable to get to set up correctly.  So, pt ambulated back to room another 49' with RW and S.  Toileted with S.  Patient up in recliner with alarm belt activated. Discussed plan for "grad day" tomorrow.  Therapy Documentation Precautions:  Precautions Precautions: Fall Precaution Comments: Orthostatic hypotension and elevated HR with activity, ataxia Restrictions Weight Bearing Restrictions: No Pain: Pain Assessment Pain Score: 0-No pain    Therapy/Group: Individual Therapy  [name]  [name], PT 07/24/2020, 9:21 AM

## 2020-07-25 ENCOUNTER — Inpatient Hospital Stay (HOSPITAL_COMMUNITY): Payer: Medicaid Other | Admitting: Occupational Therapy

## 2020-07-25 ENCOUNTER — Inpatient Hospital Stay (HOSPITAL_COMMUNITY): Payer: Medicaid Other

## 2020-07-25 MED ORDER — SERTRALINE HCL 25 MG PO TABS: 25.0000 mg | ORAL_TABLET | Freq: Every day | ORAL | 0 refills | Status: DC

## 2020-07-25 MED ORDER — DOCUSATE SODIUM 100 MG PO CAPS: 100.0000 mg | ORAL_CAPSULE | Freq: Three times a day (TID) | ORAL | 0 refills | Status: AC

## 2020-07-25 MED ORDER — PANTOPRAZOLE SODIUM 40 MG PO TBEC: 40.0000 mg | DELAYED_RELEASE_TABLET | Freq: Every day | ORAL | 0 refills | Status: DC

## 2020-07-25 MED ORDER — BACLOFEN 5 MG PO TABS: 5.0000 mg | ORAL_TABLET | Freq: Three times a day (TID) | ORAL | 0 refills | Status: DC

## 2020-07-25 MED ORDER — MELATONIN 3 MG PO TABS: 6.0000 mg | ORAL_TABLET | Freq: Every day | ORAL | 0 refills | Status: DC

## 2020-07-25 MED ORDER — MIDODRINE HCL 10 MG PO TABS: 10.0000 mg | ORAL_TABLET | Freq: Two times a day (BID) | ORAL | 1 refills | Status: DC

## 2020-07-25 NOTE — Progress Notes (Signed)
Patient ID: Jaime [name], male   DOB: 06-Apr-1996, 24 y.o.   MRN: [id number]  [facility] has declined patient for [facility].   [place], [place] [phone]

## 2020-07-25 NOTE — Progress Notes (Signed)
Occupational Therapy Session Note  Patient Details  Name: Jaime [name] MRN: [id number] Date of Birth: 03/17/1996  Today's Date: 07/25/2020 OT Individual Time: 1400-1500 OT Individual Time Calculation (min): 60 min    Short Term Goals: Week 1:  OT Short Term Goal 1 (Week 1): STGS=LTGS due to ELOS  Skilled Therapeutic Interventions/Progress Updates:  Patient met seated in recliner in agreement with OT treatment session with focus on family education in prep for safe d/c home with family and 24hr supervision/assist. 0/10 pain reported at rest and with activity. No dizziness noted with standing this session. Patient indicating need for BM. Toilet transfer with use of RW and toieleting/hygiene/clothing in sitting/standing. Mother still not present for family education after toileting task. Patient ambulated from room with RW to stairwell for stair training. Patient able to ascend and descend full flight of steps with CGA and use of bilateral hand rails. Functional mobility with RW from stairwell to dayroom without rest break. Therapeutic activity with focus on dynamic standing balance with unilateral UE support on RW. Return to room in same manner as above. Session concluded with patient seated in recliner with call bell within reach and all needs met.   Therapy Documentation Precautions:  Precautions Precautions: Fall Precaution Comments: Orthostatic hypotension and elevated HR with activity, ataxia Restrictions Weight Bearing Restrictions: No General:    Therapy/Group: Individual Therapy   R Howerton-Davis 07/25/2020, 11:43 AM  

## 2020-07-25 NOTE — Progress Notes (Signed)
Inpatient Rehabilitation Care Coordinator  Discharge Note  The overall goal for the admission was met for:   Discharge location: Yes, Home  Length of Stay: Yes, 7 Days  Discharge activity level: Yes  Home/community participation: Yes,  Services provided included: MD, RD, PT, OT, SLP, RN, CM, TR, Pharmacy and SW  Financial Services: Medicaid  Follow-up services arranged: Home Health: [facility]  Comments (or additional information): PT and OT   Patient/Family verbalized understanding of follow-up arrangements: Yes  Individual responsible for coordination of the follow-up plan: Arrie Aran, [phone]  Confirmed correct DME delivered: Dyanne Iha 07/25/2020    Dyanne Iha

## 2020-07-25 NOTE — Discharge Instructions (Signed)
Inpatient Rehab Discharge Instructions  LAVONNE CASS Discharge date and time: No discharge date for patient encounter.   Activities/Precautions/ Functional Status: Activity: activity as tolerated Diet: regular diet Wound Care: Routine skin checks Functional status:  ___ No restrictions     ___ Walk up steps independently ___ 24/7 supervision/assistance   ___ Walk up steps with assistance ___ Intermittent supervision/assistance  ___ Bathe/dress independently ___ Walk with walker     _x__ Bathe/dress with assistance ___ Walk Independently    ___ Shower independently ___ Walk with assistance    ___ Shower with assistance ___ No alcohol     ___ Return to work/school ________ COMMUNITY REFERRALS UPON DISCHARGE:    Home Health:   PT     OT                    Agency: [facility] Phone: [phone]    Medical Equipment/Items Ordered: Bedside Commode                                                 Agency/Supplier: [employer]  Special Instructions: No driving smoking or alcohol   My questions have been answered and I understand these instructions. I will adhere to these goals and the provided educational materials after my discharge from the hospital.  Patient/Caregiver Signature _______________________________ Date __________  Clinician Signature _______________________________________ Date __________  Please bring this form and your medication list with you to all your follow-up doctor's appointments.

## 2020-07-25 NOTE — Progress Notes (Signed)
Patient ID: Jaime [name], male   DOB: Jul 13, 1996, 24 y.o.   MRN: [id number]  Patient accepted by [facility] for [facility] follow up. Orders have been faxed

## 2020-07-25 NOTE — Plan of Care (Signed)
Problem: RH Bathing Goal: LTG Patient will bathe all body parts with assist levels (OT) Description: LTG: Patient will bathe all body parts with assist levels (OT) Outcome: Completed/Met   Problem: RH Dressing Goal: LTG Patient will perform lower body dressing w/assist (OT) Description: LTG: Patient will perform lower body dressing with assist, with/without cues in positioning using equipment (OT) Outcome: Completed/Met   Problem: RH Toileting Goal: LTG Patient will perform toileting task (3/3 steps) with assistance level (OT) Description: LTG: Patient will perform toileting task (3/3 steps) with assistance level (OT)  Outcome: Completed/Met   Problem: RH Toilet Transfers Goal: LTG Patient will perform toilet transfers w/assist (OT) Description: LTG: Patient will perform toilet transfers with assist, with/without cues using equipment (OT) Outcome: Completed/Met   Problem: RH Tub/Shower Transfers Goal: LTG Patient will perform tub/shower transfers w/assist (OT) Description: LTG: Patient will perform tub/shower transfers with assist, with/without cues using equipment (OT) Outcome: Completed/Met

## 2020-07-25 NOTE — Progress Notes (Signed)
[place] [facility] PHYSICAL MEDICINE & REHABILITATION PROGRESS NOTE   Subjective/Complaints: No complaints this morning. Denies pain, constipation, insomnia.  No longer with dizziness or orthostasis in therapy  ROS: denies chest pain shortness of breath nausea vomiting or diarrhea, positive hiccups intractable  Objective:   No results found. No results for input(s): WBC, HGB, HCT, PLT in the last 72 hours. Recent Labs    07/23/20 0612  NA 137  K 4.0  CL 103  CO2 25  GLUCOSE 89  BUN 15  CREATININE 0.83  CALCIUM 8.9    Intake/Output Summary (Last 24 hours) at 07/25/2020 0933 Last data filed at 07/25/2020 0800 Gross per 24 hour  Intake 296 ml  Output 300 ml  Net -4 ml        Physical Exam: Vital Signs Blood pressure 103/65, pulse 63, temperature 98.2 F (36.8 C), resp. rate 18, height 5\' 9"  (1.753 m), weight 53.8 kg, SpO2 100 %.   General: Alert and oriented x 3, No apparent distress HEENT: Head is normocephalic, atraumatic, PERRLA, EOMI, sclera anicteric, oral mucosa pink and moist, dentition intact, ext ear canals clear,  Neck: Supple without JVD or lymphadenopathy Heart: Reg rate and rhythm. No murmurs rubs or gallops Chest: CTA bilaterally without wheezes, rales, or rhonchi; no distress Abdomen: Soft, non-tender, non-distended, bowel sounds positive. Extremities: No clubbing, cyanosis, or edema. Pulses are 2+ Skin: Clean and intact without signs of breakdown  Neurologic: Cranial nerves II through XII intact, motor strength is 4/5 in bilateral deltoid, bicep, tricep, grip, hip flexor, knee extensors, ankle dorsiflexor and plantar flexor Sensory exam normal sensation to light touch and proprioception in bilateral upper and lower extremities Cerebellar exam dysmetria right greater than left finger to nose to finger as well as heel to shin in bilateral upper and lower extremities Musculoskeletal: Full range of motion in all 4 extremities. No joint  swelling    Assessment/Plan: 1. Functional deficits secondary to MS exacerbation which require 3+ hours per day of interdisciplinary therapy in a comprehensive inpatient rehab setting.  Physiatrist is providing close team supervision and 24 hour management of active medical problems listed below.  Physiatrist and rehab team continue to assess barriers to discharge/monitor patient progress toward functional and medical goals  Care Tool:  Bathing    Body parts bathed by patient: Right arm, Left arm, Chest, Abdomen, Front perineal area, Buttocks, Face, Right upper leg, Left upper leg, Left lower leg, Right lower leg         Bathing assist Assist Level: Supervision/Verbal cueing     Upper Body Dressing/Undressing Upper body dressing   What is the patient wearing?: Pull over shirt    Upper body assist Assist Level: Set up assist    Lower Body Dressing/Undressing Lower body dressing      What is the patient wearing?: Pants     Lower body assist Assist for lower body dressing: Contact Guard/Touching assist     Toileting Toileting    Toileting assist Assist for toileting: Contact Guard/Touching assist Assistive Device Comment: urinal.   Transfers Chair/bed transfer  Transfers assist     Chair/bed transfer assist level: Supervision/Verbal cueing     Locomotion Ambulation   Ambulation assist      Assist level: Supervision/Verbal cueing Assistive device: Walker-rolling Max distance: 220   Walk 10 feet activity   Assist  Walk 10 feet activity did not occur: Safety/medical concerns (evaluation limited by nausea and OH)  Assist level: Supervision/Verbal cueing Assistive device: Walker-rolling   Walk  50 feet activity   Assist Walk 50 feet with 2 turns activity did not occur: Safety/medical concerns  Assist level: Supervision/Verbal cueing Assistive device: Walker-rolling    Walk 150 feet activity   Assist Walk 150 feet activity did not occur:  Safety/medical concerns  Assist level: Supervision/Verbal cueing Assistive device: Walker-rolling    Walk 10 feet on uneven surface  activity   Assist Walk 10 feet on uneven surfaces activity did not occur: Safety/medical concerns (evaluation limited by nausea and OH)   Assist level: Contact Guard/Touching assist Assistive device: Photographer Will patient use wheelchair at discharge?: No Type of Wheelchair: Manual    Wheelchair assist level: Supervision/Verbal cueing Max wheelchair distance: 120'    Wheelchair 50 feet with 2 turns activity    Assist        Assist Level: Supervision/Verbal cueing   Wheelchair 150 feet activity     Assist      Assist Level: Supervision/Verbal cueing   Blood pressure 103/65, pulse 63, temperature 98.2 F (36.8 C), resp. rate 18, height 5\' 9"  (1.753 m), weight 53.8 kg, SpO2 100 %.  Medical Problem List and Plan: 1.Decreased functional abilitysecondary to multiple sclerosis exacerbation. IV Solu-Medrol x5 completed. Follow-up outpatient Dr. -patient may shower -ELOS/Goals: 5-7  Continue CIR  DC home 10/23. Transitional care f/u with Dr. 11/23 in 1-2 weeks. Wean Midodrine outpatient as tolerated.  2. Antithrombotics: -DVT/anticoagulation:Subcutaneous Lovenox -antiplatelet therapy: N/A 3. Pain Management:Tylenol as needed. Well controlled 4. Mood/mild autism:Patient is cognitively at baseline. Melatonin 6 mg nightly as needed. Positive mood.  -antipsychotic agents: N/A 5. Neuropsych: This patientiscapable of making decisions on hisown behalf. 6. Skin/Wound Care:Routine skin checks 7. Fluids/Electrolytes/Nutrition:Routine in and outs with follow-up chemistries Good urine Output, not incont 8. Orthostasis.   10/22: dizziness and orthostasis resolved with increased dose of Midodrine. BP at rest continues to be on low side.   9. Constipation. MiraLAX as needed, moving bowels regularly  10.  Leukocytosis- afebrile, likely due to steroids, trending downward 11.  Intractable hiccups, likely MS related, will trial baclofen. Resolved. Can wean off as tolerated as outpatient.   12. Hypokalemia: K+ 3.1 today. Change supplement to kdur and repeat tomorrow morning.   10/20: K+ is well controlled. Recheck at outpatient follow-up.    LOS: 6 days A FACE TO FACE EVALUATION WAS PERFORMED  11/20 07/25/2020, 9:33 AM

## 2020-07-25 NOTE — Progress Notes (Signed)
Patient ID: Jaime [name], male   DOB: 18-Dec-1995, 24 y.o.   MRN: [id number]   Patient [facility] referral sent to [facility] for review. Will follow up with determination.  [place], [place] [phone]

## 2020-07-25 NOTE — Progress Notes (Signed)
Patient ID: Jaime [name], male   DOB: 06/11/1996, 24 y.o.   MRN: [id number]   SW attempted to call pt mother to inform her of d/c information in pt room. No answer, unable to leave VM.

## 2020-07-25 NOTE — Progress Notes (Signed)
Patient ID: Jaime [name], male   DOB: 10-25-1995, 24 y.o.   MRN: [id number]  [facility] ordered through [facility] [place], [place] [phone]

## 2020-07-25 NOTE — Progress Notes (Signed)
Physical Therapy Discharge Summary  Patient Details  Name: Jaime [name] MRN: [id number] Date of Birth: [birth year]  Today's Date: 07/25/2020 PT Individual Time: 0930-1030      Patient has met 7 of 8 long term goals due to improved activity tolerance, improved balance, improved postural control, increased strength and ability to compensate for deficits.  Patient to discharge at an ambulatory level Supervision.   Patient's care partner is independent to provide the necessary physical assistance at discharge.  Reasons goals not met: Dynamic standing balance pt needs supervision for safety due to ataxia.  Recommendation:  Patient will benefit from ongoing skilled PT services in home health setting to continue to advance safe functional mobility, address ongoing impairments in balance, strength, endurance, and minimize fall risk.  Equipment: No equipment provided  Reasons for discharge: treatment goals met and discharge from hospital  Patient/family agrees with progress made and goals achieved: Yes  PT Discharge Precautions/Restrictions Precautions Precautions: Fall Precaution Comments: Orthostatic hypotension and elevated HR with activity, ataxia Vital Signs Therapy Vitals Temp: 97.9 F (36.6 C) Pulse Rate: 94 Resp: 18 BP: 101/67 Patient Position (if appropriate): Sitting Oxygen Therapy SpO2: 95 % O2 Device: Room Air Pain Pain Assessment Pain Scale: 0-10 Pain Score: 0-No pain Vision/Perception     Cognition Overall Cognitive Status: Within Functional Limits for tasks assessed Arousal/Alertness: Awake/alert Orientation Level: Oriented X4 Safety/Judgment: Appears intact Sensation Sensation Light Touch: Appears Intact Coordination Coordination and Movement Description: UE tremors R>L and ataxia with orthostatic hypotension limiting mobility but overall at CGA/supervision level with RW Motor  Motor Motor: Ataxia;Abnormal postural alignment and control Motor -  Discharge Observations: Ataxic and tremulous, limited balance strategies but improved since eval  Mobility   Locomotion  Gait Ambulation: Yes Gait Assistance: Supervision/Verbal cueing Gait Distance (Feet): 250 Feet Assistive device: Rolling walker Gait Gait Pattern: Impaired Gait Pattern: Ataxic;Lateral hip instability;Step-through pattern;Wide base of support Stairs / Additional Locomotion Stairs: Yes Stairs Assistance: Contact Guard/Touching assist Stair Management Technique: Two rails Number of Stairs: 12 Height of Stairs: 6 Ramp: Supervision/Verbal cueing Curb: Contact Guard/Touching assist  Trunk/Postural Assessment  Cervical Assessment Cervical Assessment: Within Functional Limits Postural Control Postural Control: Within Functional Limits  Balance Balance Balance Assessed: Yes Static Sitting Balance Static Sitting - Balance Support: No upper extremity supported Static Sitting - Level of Assistance: 6: Modified independent (Device/Increase time) Dynamic Sitting Balance Dynamic Sitting - Balance Support: No upper extremity supported Dynamic Sitting - Level of Assistance: 5: Stand by assistance Static Standing Balance Static Standing - Balance Support: No upper extremity supported;During functional activity Static Standing - Level of Assistance: 5: Stand by assistance Dynamic Standing Balance Dynamic Standing - Balance Support: During functional activity;Left upper extremity supported Dynamic Standing - Level of Assistance: 5: Stand by assistance Dynamic Standing - Balance Activities: Forward lean/weight shifting;Lateral lean/weight shifting Extremity Assessment      RLE Assessment Active Range of Motion (AROM) Comments: WFL with ataxic tremors with mobility General Strength Comments: Grossly 4+/5 throughout in sitting LLE Assessment Active Range of Motion (AROM) Comments: WFL with ataxic tremors with mobility General Strength Comments: Grossly 4+/5 throughout in  sitting    Reginia Naas 07/25/2020, 5:14 PM  Magda Kiel, PT 07/25/2020

## 2020-07-25 NOTE — Progress Notes (Signed)
Physical Therapy Session Note  Patient Details  Name: Jaime [name] MRN: [id number] Date of Birth: 15-Jul-1996  Today's Date: 07/25/2020 PT Individual Time: 0930-1030 PT Individual Time Calculation (min): 60 min   Short Term Goals: Week 1:  PT Short Term Goal 1 (Week 1): STG=LTG due to short ELOS.  Skilled Therapeutic Interventions/Progress Updates:   Patient in supine assisted to obtain clothing and to don binder and initiate TED stockings, pt performed rest with S.  Toileted with S and ambulated 220' with S to ortho gym.  Performed car transfer with S then negotiated curb step with CGA with RW.  Patient picked up item from floor and c/o dizziness orthostatic VS as below.  Sit to stand again with SBP 100 and pt with improved symptoms so ambulated back to room S with one seated rest due to symptoms.  Left in recliner with alarm belt and call bell in reach.  RN informed of VS/symptoms.   Therapy Documentation Precautions:  Precautions Precautions: Fall Precaution Comments: Orthostatic hypotension and elevated HR with activity, ataxia Restrictions Weight Bearing Restrictions: No General:   Vital Signs:    Orthostatic VS for the past 24 hrs (Last 3 readings):  BP- Sitting Pulse- Sitting BP- Standing at 0 minutes Pulse- Standing at 0 minutes  07/25/20 1000 106/77 80 (!) 84/62 149   Pain: Pain Assessment Pain Score: 0-No pain    Therapy/Group: Individual Therapy  [name]  [place], PT 07/25/2020, 8:24 AM

## 2020-07-26 DIAGNOSIS — I951 Orthostatic hypotension: Secondary | ICD-10-CM

## 2020-07-26 DIAGNOSIS — D72828 Other elevated white blood cell count: Secondary | ICD-10-CM

## 2020-07-26 DIAGNOSIS — E876 Hypokalemia: Secondary | ICD-10-CM

## 2020-07-26 LAB — CREATININE, SERUM
Creatinine, Ser: 0.98 mg/dL (ref 0.61–1.24)
GFR, Estimated: >60 mL/min (ref >60)

## 2020-07-26 NOTE — Progress Notes (Signed)
[facility] PHYSICAL MEDICINE & REHABILITATION PROGRESS NOTE   Subjective/Complaints: Patient seen laying in bed this morning.  He states he slept well overnight.  He is ready for discharge.  ROS: Denies CP, SOB, N/V/D  Objective:   No results found. No results for input(s): WBC, HGB, HCT, PLT in the last 72 hours. Recent Labs    07/26/20 0420  CREATININE 0.98    Intake/Output Summary (Last 24 hours) at 07/26/2020 0929 Last data filed at 07/25/2020 1416 Gross per 24 hour  Intake 177 ml  Output 300 ml  Net -123 ml        Physical Exam: Vital Signs Blood pressure 98/65, pulse (!) 57, temperature 98 F (36.7 C), resp. rate 18, height 5\' 9"  (1.753 m), weight 53.8 kg, SpO2 100 %. Constitutional: No distress . Vital signs reviewed. HENT: Normocephalic.  Atraumatic. Eyes: EOMI. No discharge. Cardiovascular: No JVD.  RRR. Respiratory: Normal effort.  No stridor.  Bilateral clear to auscultation. GI: Non-distended.  BS +. Skin: Warm and dry.  Intact. Psych: Normal mood.  Normal behavior. Musc: No edema in extremities.  No tenderness in extremities. Neuro: Alert Motor: 4+/5 throughout  Bilateral lower extremity ataxia  Assessment/Plan: 1. Functional deficits secondary to MS exacerbation which require 3+ hours per day of interdisciplinary therapy in a comprehensive inpatient rehab setting.  Physiatrist is providing close team supervision and 24 hour management of active medical problems listed below.  Physiatrist and rehab team continue to assess barriers to discharge/monitor patient progress toward functional and medical goals  Care Tool:  Bathing    Body parts bathed by patient: Right arm, Left arm, Chest, Abdomen, Front perineal area, Buttocks, Face, Right upper leg, Left upper leg, Left lower leg, Right lower leg         Bathing assist Assist Level: Supervision/Verbal cueing     Upper Body Dressing/Undressing Upper body dressing   What is the patient  wearing?: Pull over shirt, Orthosis Orthosis activity level: Performed by patient (abdominal binder)  Upper body assist Assist Level: Set up assist    Lower Body Dressing/Undressing Lower body dressing      What is the patient wearing?: Pants, Underwear/pull up     Lower body assist Assist for lower body dressing: Supervision/Verbal cueing Assistive Device Comment: donned in supine   Toileting Toileting    Toileting assist Assist for toileting: Supervision/Verbal cueing Assistive Device Comment: urinal.   Transfers Chair/bed transfer  Transfers assist     Chair/bed transfer assist level: Independent with assistive device Chair/bed transfer assistive device: assist      Assist level: Supervision/Verbal cueing Assistive device: Walker-rolling Max distance: 220   Walk 10 feet activity   Assist  Walk 10 feet activity did not occur: Safety/medical concerns (evaluation limited by nausea and OH)  Assist level: Supervision/Verbal cueing Assistive device: Walker-rolling   Walk 50 feet activity   Assist Walk 50 feet with 2 turns activity did not occur: Safety/medical concerns  Assist level: Supervision/Verbal cueing Assistive device: Walker-rolling    Walk 150 feet activity   Assist Walk 150 feet activity did not occur: Safety/medical concerns  Assist level: Supervision/Verbal cueing Assistive device: Walker-rolling    Walk 10 feet on uneven surface  activity   Assist Walk 10 feet on uneven surfaces activity did not occur: Safety/medical concerns (evaluation limited by nausea and OH)   Assist level: Supervision/Verbal cueing Assistive device: Arboriculturist Will patient  use wheelchair at discharge?: No Type of Wheelchair: Manual    Wheelchair assist level: Supervision/Verbal cueing Max wheelchair distance: 120'    Wheelchair 50 feet with 2 turns  activity    Assist        Assist Level: Supervision/Verbal cueing   Wheelchair 150 feet activity     Assist      Assist Level: Supervision/Verbal cueing   Blood pressure 98/65, pulse (!) 57, temperature 98 F (36.7 C), resp. rate 18, height 5\' 9"  (1.753 m), weight 53.8 kg, SpO2 100 %.  Medical Problem List and Plan: 1.Decreased functional abilitysecondary to multiple sclerosis exacerbation. IV Solu-Medrol x5 completed. Follow-up outpatient Dr.  D/c today  Patient to follow-up for transitional care management in 1-2 weeks post-discharge 2. Antithrombotics: -DVT/anticoagulation:Subcutaneous Lovenox -antiplatelet therapy: N/A 3. Pain Management:Tylenol as needed. Well controlled 4. Mood/mild autism:Patient is cognitively at baseline. Melatonin 6 mg nightly as needed. Positive mood.  -antipsychotic agents: N/A 5. Neuropsych: This patientiscapable of making decisions on hisown behalf. 6. Skin/Wound Care:Routine skin checks 7. Fluids/Electrolytes/Nutrition:Routine in and outs with follow-up chemistries Good urine Output, not incont 8. Orthostasis.   Improved with increased dose of Midodrine.  9. Constipation. MiraLAX as needed  Improved  10.  Leukocytosis- afebrile, likely due to steroids, trending downward, WBC 13.2 on 10/18 11.  Intractable hiccups: Resolved  Continue baclofen. Can wean off as tolerated as outpatient.   12. Hypokalemia:   4.0 10/20  LOS: 7 days A FACE TO FACE EVALUATION WAS PERFORMED  Sophiarose Eades 11/20 07/26/2020, 9:29 AM

## 2020-07-28 NOTE — Telephone Encounter (Signed)
Pt's mother has still not completed pt's Tysabri start form. I have spoken to her several times over the past several weeks asking her to come in and complete this.  I called pt's mother, per DPR. I reminded her again to come by the office during office hours to complete the tysabri start form for the pt. Pt's mother verbalized understanding.

## 2020-08-04 ENCOUNTER — Other Ambulatory Visit: Payer: Self-pay

## 2020-08-04 ENCOUNTER — Encounter: Payer: Self-pay | Admitting: Physical Medicine and Rehabilitation

## 2020-08-04 ENCOUNTER — Encounter
Payer: Medicaid Other | Attending: Physical Medicine and Rehabilitation | Admitting: Physical Medicine and Rehabilitation

## 2020-08-04 VITALS — BP 111/74 | HR 89 | Temp 98.8°F | Ht 69.0 in | Wt 123.0 lb

## 2020-08-04 DIAGNOSIS — I951 Orthostatic hypotension: Secondary | ICD-10-CM | POA: Diagnosis not present

## 2020-08-04 DIAGNOSIS — G35 Multiple sclerosis: Secondary | ICD-10-CM | POA: Insufficient documentation

## 2020-08-04 DIAGNOSIS — F840 Autistic disorder: Secondary | ICD-10-CM | POA: Diagnosis not present

## 2020-08-04 NOTE — Progress Notes (Signed)
Subjective:    Patient ID: Jaime [name], male    DOB: 03-15-1996, 24 y.o.   MRN: [id number]  HPI   Pt is a 24 yr old autistic male with rapidly progressive- MS s/p 3rd-4th exacerbation now since March 2021.      Supposed to start infusion next week. Supposed to  start Tysabri, but might have changed.    Just got out of CIR again last week 10/23- - was admitted 10/12 for another MS exacerbation.    Back to walking up and down stairs to bedroom  If sitting for awhile, tremors are worse.     Doesn't c/o muscle tightness.  But tremors a real issue.   Has a new PCP- Dr Earnest Bailey- hasn't seen yet.   Couldn't walk when got admitted to hospital this last time for 1 day.   Is now taking: Baclofen 5 mg TID Midodrine 10 mg BID- breakfast, lunch Zoloft 25 mg daily.  Melatonin 6 mg QHS for sleep  They called her to set up H/H therapy.   Other 2 kids are doing home schooling,which is more complicated.   Sleeping well- sleeping well/good- not too much/too little.   Was down to 116-117 lbs- up to 123 lbs as of today.  Trying to feed him.   Thinks fatigue/tiredness is  A "little".   Also dizzy after shower- sits ot take shower.  But after shower wears compression socks (can now put on him self and abd binder).    Walked 50 ft without RW and using hand held assist.  Uses RW outside, but not in home.   On lowest dose of Zoloft, but feeling a little better since started on Zoloft.  Coming downstairs a little more.  Feels like it's working, doesn't want to increase right now.  Now giggling some, per pt.    Pain Inventory Average Pain 0 Pain Right Now 0 My pain is na  LOCATION OF PAIN  na  BOWEL Number of stools per week: 5 Oral laxative use No  Type of laxative na Enema or suppository use No  History of colostomy No  Incontinent No   BLADDER Normal In and out cath, frequency n/a Able to self cath No  Bladder incontinence No  Frequent urination No    Leakage with coughing No  Difficulty starting stream No  Incomplete bladder emptying No    Mobility walk with assistance use a walker ability to climb steps?  yes do you drive?  no  Function disabled: date disabled .  Neuro/Psych bladder control problems bowel control problems weakness tremor trouble walking spasms dizziness  Prior Studies Any changes since last visit?  no  Physicians involved in your care Any changes since last visit?  no   Family History  Problem Relation Age of Onset  . Clotting disorder Mother    Social History   Socioeconomic History  . Marital status: Single    Spouse name: Not on file  . Number of children: Not on file  . Years of education: Not on file  . Highest education level: Not on file  Occupational History  . Not on file  Tobacco Use  . Smoking status: Never Smoker  . Smokeless tobacco: Never Used  Vaping Use  . Vaping Use: Never used  Substance and Sexual Activity  . Alcohol use: No  . Drug use: Never  . Sexual activity: Not Currently  Other Topics Concern  . Not on file  Social History Narrative  Right handed    Caffeine use: soda sometimes   Lives with family   Social Determinants of Health   Financial Resource Strain:   . Difficulty of Paying Living Expenses: Not on file  Food Insecurity:   . Worried About Programme researcher, broadcasting/film/video in the Last Year: Not on file  . Ran Out of Food in the Last Year: Not on file  Transportation Needs:   . Lack of Transportation (Medical): Not on file  . Lack of Transportation (Non-Medical): Not on file  Physical Activity:   . Days of Exercise per Week: Not on file  . Minutes of Exercise per Session: Not on file  Stress:   . Feeling of Stress : Not on file  Social Connections:   . Frequency of Communication with Friends and Family: Not on file  . Frequency of Social Gatherings with Friends and Family: Not on file  . Attends Religious Services: Not on file  . Active Member of  Clubs or Organizations: Not on file  . Attends Banker Meetings: Not on file  . Marital Status: Not on file   Past Surgical History:  Procedure Laterality Date  . NO PAST SURGERIES     Past Medical History:  Diagnosis Date  . Autism   . Depression   . MS (multiple sclerosis) (HCC)    BP 111/74   Pulse 89   Temp 98.8 F (37.1 C)   Ht 5\' 9"  (1.753 m)   Wt 123 lb (55.8 kg)   SpO2 96%   BMI 18.16 kg/m   Opioid Risk Score:   Fall Risk Score:  `1  Depression screen PHQ 2/9  Depression screen PHQ 2/9 01/09/2020  Decreased Interest 0  Down, Depressed, Hopeless 0  PHQ - 2 Score 0  Altered sleeping 0  Tired, decreased energy 0  Change in appetite 0  Feeling bad or failure about yourself  0  Trouble concentrating 0  Moving slowly or fidgety/restless 0  Suicidal thoughts 0  PHQ-9 Score 0    Review of Systems  Musculoskeletal:       Spasms  Neurological: Positive for dizziness, tremors and weakness.  All other systems reviewed and are negative.      Objective:   Physical Exam  Awake, alert, appropriate, flat affect, accompanied by mother, NAD  Tremors more obvious than when seen last time- in UEs and LEs- doesn't look like spasticity - looks like tremors  MS: UEs- deltoids, biceps, triceps, WE, grip and finger abd 5-/5- just a little weak LEs- HF 4/5 B/L, KE and KF 4+/5, DF 5-/5, and PF 4+/5 B/L  Neuro: homffna's (+) on R- not L MAS of 1 in LEs- not UEs- no clonus B/L  Gait: broad based gait- and feet flared out laterally when walking- foot stomping with gait, so DF is weak  Numerous (numbering at least 15-20) subcentimeter enhancing lesions within the juxtacortical and subcortical white matter of the bilateral cerebral hemispheres. The majority of, if not all of, these enhancing lesions appear new as compared to the MRI of 05/19/2020 and are consistent with sites of active demyelination.  As before, there is background severe multifocal T2  hyperintense signal abnormality within the cerebral white matter, corpus callosum, thalami, brainstem, cerebellum and middle cerebellar peduncles. These findings are consistent with the provided history of multiple sclerosis.     Assessment & Plan:   Patient is a 24 yr old autistic male with fast relapsing-remitting MS- with 4 exacerbations now in <  1 yr- with autonomic dysfunction.    1. If steams room up, it's too hot- and turn it down- if it's too hot, will make him weaker.    2. Heat makes MS worse!- Makes you weaker.  Dress in layers so can take it off.  No more than 73 degrees in house.     3. Can talk about cooling vests, etc for next summer.   4. Speak with Dr Epimenio Foot about Tremors!!!  5. Call H/H about more therapy/gait and endurance.   6. Probably need to use your RW  More than him- -be careful with furniture walking.    7. F/U in 6 weeks. Watch memory and taking meds.    I spent a total of 30 minutes on visit- as detailed above.

## 2020-08-04 NOTE — Patient Instructions (Signed)
Patient is a 24 yr old autistic male with fast relapsing-remitting MS- with 4 exacerbations now in <1 yr- with autonomic dysfunction.    1. If steams room up, it's too hot- and turn it down- if it's too hot, will make him weaker.    2. Heat makes MS worse!- Makes you weaker.  Dress in layers so can take it off.  No more than 73 degrees in house.     3. Can talk about cooling vests, etc for next summer.   4. Speak with Dr Epimenio Foot about Tremors!!!  5. Call H/H about more therapy/gait and endurance.   6. Probably need to use your RW  More than him- -be careful with furniture walking.    7. F/U in 6 weeks. Watch memory and taking meds.

## 2020-08-05 ENCOUNTER — Telehealth: Payer: Self-pay | Admitting: Neurology

## 2020-08-05 NOTE — Telephone Encounter (Signed)
Pt's mother, Gunnison Chahal (on [place]) called to discuss some things  previous conversation had this morning. Ms. Farve did not elaborate, would like a call from the nurse.

## 2020-08-05 NOTE — Telephone Encounter (Signed)
I called pt's mother, [name], per DPR. She reports that [facility], spoke with [facility], who told her that the order for tysabri was not sent to them. I advised pt's mother that I will fax the order again right now. Pt's mother will call the [facility] again.  See telephone note from 07/07/2020 from [facility] regarding this tysabri order that has already been sent.

## 2020-08-05 NOTE — Telephone Encounter (Signed)
I called pt's mother, per DPR. I reminded her that the tysabri infusion is every 4 weeks. I gave her the [facility] phone number of [phone] to call and schedule pt's tysabri infusion. Pt's mother will call me back for any further questions or concerns.

## 2020-08-11 NOTE — Telephone Encounter (Signed)
Pt is still not scheduled for his tysabri infusion. I called pt's mother to find out why. No answer, VM not set up yet. Pt should call [phone] to schedule this infusion.

## 2020-08-12 NOTE — Telephone Encounter (Signed)
I called pt's mother, [name], per DPR. Pt is scheduled for his first [facility]. They will let us know of any questions or concerns. I reminded pt's mother of pt's with Dr. Epimenio Foot on 09/24/2020. Pt's mother verbalized understanding.

## 2020-08-12 NOTE — Telephone Encounter (Signed)
Pt is still not scheduled for his tysabri infusion. I called pt's mother again to find out why. No answer, VM not set up yet. Pt should call [phone] to schedule this infusion.

## 2020-08-13 ENCOUNTER — Other Ambulatory Visit: Payer: Self-pay

## 2020-08-13 ENCOUNTER — Ambulatory Visit (HOSPITAL_COMMUNITY)
Admission: RE | Admit: 2020-08-13 | Discharge: 2020-08-13 | Disposition: A | Discharge status: Home / Self Care | Payer: Medicaid Other | Source: Ambulatory Visit | Attending: Internal Medicine | Admitting: Internal Medicine

## 2020-08-13 DIAGNOSIS — G35 Multiple sclerosis: Secondary | ICD-10-CM | POA: Diagnosis not present

## 2020-08-13 MED ORDER — SODIUM CHLORIDE 0.9 % IV SOLN
300.0000 mg | INTRAVENOUS | Status: DC
  Administered 2020-08-13: 300 mg via INTRAVENOUS
  Filled 2020-08-13: qty 15

## 2020-08-13 MED ORDER — SODIUM CHLORIDE 0.9 % IV SOLN
INTRAVENOUS | Status: DC | PRN
  Administered 2020-08-13: 250 mL via INTRAVENOUS

## 2020-08-13 NOTE — Progress Notes (Signed)
Patient received  IV Tysabri as ordered by Despina Arias MD. Observed for at least 60 minutes post infusion.Tolerated well, vitals stable, discharge instructions given to the patient and family member, verbalized understanding. Patient alert, oriented and transported in a wheelchair in the company of a family member at the time of discharge.

## 2020-08-13 NOTE — Telephone Encounter (Signed)
Osagi from [facility] called to ask if as a result of pt having steroid injections about a month ago would weaken pt's immuune system.  Phone rep confirmed with Emma,RN that pt will be ok to still move forward with Tysabri infusion.  Osagi then asked for pt's JCV results.  Please call Osagi @ [phone], pt is already at the hospital for the [facility]

## 2020-08-13 NOTE — Discharge Instructions (Signed)
Natalizumab injection What is this medicine? NATALIZUMAB (na ta LIZ you mab) is used to treat relapsing multiple sclerosis. This drug is not a cure. It is also used to treat Crohn's disease. This medicine may be used for other purposes; ask your health care provider or pharmacist if you have questions. COMMON BRAND NAME(S): Tysabri What should I tell my health care provider before I take this medicine? They need to know if you have any of these conditions:  immune system problems  progressive multifocal leukoencephalopathy (PML)  an unusual or allergic reaction to natalizumab, other medicines, foods, dyes, or preservatives  pregnant or trying to get pregnant  breast-feeding How should I use this medicine? This medicine is for infusion into a vein. It is given by a health care professional in a hospital or clinic setting. A special MedGuide will be given to you by the pharmacist with each prescription and refill. Be sure to read this information carefully each time. Talk to your pediatrician regarding the use of this medicine in children. This medicine is not approved for use in children. Overdosage: If you think you have taken too much of this medicine contact a poison control center or emergency room at once. NOTE: This medicine is only for you. Do not share this medicine with others. What if I miss a dose? It is important not to miss your dose. Call your doctor or health care professional if you are unable to keep an appointment. What may interact with this medicine? Do not take this medicine with any of the following medications:  biologic medicines such as adalimumab, certolizumab, etanercept, golimumab, infliximab This medicine may also interact with the following medications:  azathioprine  cyclosporine  interferons  6-mercaptopurine  methotrexate  other medicines that lower your chance of fighting an infection  steroid medicines like prednisone or  cortisone  vaccines This list may not describe all possible interactions. Give your health care provider a list of all the medicines, herbs, non-prescription drugs, or dietary supplements you use. Also tell them if you smoke, drink alcohol, or use illegal drugs. Some items may interact with your medicine. What should I watch for while using this medicine? Your condition will be monitored carefully while you are receiving this medicine. Visit your doctor for regular check ups. Tell your doctor or healthcare professional if your symptoms do not start to get better or if they get worse. Stay away from people who are sick. Call your doctor or health care professional for advice if you get a fever, chills or sore throat, or other symptoms of a cold or flu. Do not treat yourself. In some patients, this medicine may cause a serious brain infection that may cause death. If you have any problems seeing, thinking, speaking, walking, or standing, tell your doctor right away. If you cannot reach your doctor, get urgent medical care. What side effects may I notice from receiving this medicine? Side effects that you should report to your doctor or health care professional as soon as possible:  allergic reactions like skin rash, itching or hives, swelling of the face, lips, or tongue  breathing problems  changes in vision  chest pain  confusion  depressed mood  dizziness  feeling faint; lightheaded; falls  general ill feeling or flu-like symptoms  loss of memory  missed menstrual periods  muscle weakness  problems with balance, talking, or walking  signs and symptoms of liver injury like dark yellow or brown urine; general ill feeling or flu-like symptoms; light-colored   stools; loss of appetite; nausea; right upper belly pain; unusually weak or tired; yellowing of the eyes or skin  suicidal thoughts, mood changes  unusual bruising or bleeding  unusually weak or tired Side effects that  usually do not require medical attention (report to your doctor or health care professional if they continue or are bothersome):  headache  joint pain  muscle cramps  muscle pain  nausea, vomiting  pain, redness, or irritation at site where injected  tiredness This list may not describe all possible side effects. Call your doctor for medical advice about side effects. You may report side effects to FDA at 1-800-FDA-1088. Where should I keep my medicine? This drug is given in a hospital or clinic and will not be stored at home. NOTE: This sheet is a summary. It may not cover all possible information. If you have questions about this medicine, talk to your doctor, pharmacist, or health care provider.  2020 Elsevier/Gold Standard (2019-03-26 13:20:26)  

## 2020-08-13 NOTE — Telephone Encounter (Signed)
I called Jaime [name]. I advised him that the JCV results are in Epic, drawn on 06/19/2020, and there is a note from 9/29 that it was negative, Index was .14.

## 2020-09-15 ENCOUNTER — Encounter
Payer: Medicaid Other | Attending: Physical Medicine and Rehabilitation | Admitting: Physical Medicine and Rehabilitation

## 2020-09-15 ENCOUNTER — Encounter: Payer: Self-pay | Admitting: Physical Medicine and Rehabilitation

## 2020-09-15 ENCOUNTER — Other Ambulatory Visit: Payer: Self-pay

## 2020-09-15 VITALS — BP 111/70 | HR 67 | Temp 98.8°F | Ht 69.0 in | Wt 123.0 lb

## 2020-09-15 DIAGNOSIS — G35 Multiple sclerosis: Secondary | ICD-10-CM | POA: Diagnosis present

## 2020-09-15 DIAGNOSIS — I951 Orthostatic hypotension: Secondary | ICD-10-CM | POA: Diagnosis present

## 2020-09-15 DIAGNOSIS — F32A Depression, unspecified: Secondary | ICD-10-CM | POA: Diagnosis present

## 2020-09-15 DIAGNOSIS — R2689 Other abnormalities of gait and mobility: Secondary | ICD-10-CM | POA: Insufficient documentation

## 2020-09-15 DIAGNOSIS — I498 Other specified cardiac arrhythmias: Secondary | ICD-10-CM | POA: Insufficient documentation

## 2020-09-15 DIAGNOSIS — G90A Postural orthostatic tachycardia syndrome (POTS): Secondary | ICD-10-CM

## 2020-09-15 MED ORDER — BACLOFEN 5 MG PO TABS
5.0000 mg | ORAL_TABLET | Freq: Three times a day (TID) | ORAL | 11 refills | Status: DC
Start: 2020-09-15 — End: 2021-08-31

## 2020-09-15 MED ORDER — MIDODRINE HCL 10 MG PO TABS
10.0000 mg | ORAL_TABLET | Freq: Two times a day (BID) | ORAL | 11 refills | Status: DC
Start: 2020-09-15 — End: 2021-08-31

## 2020-09-15 MED ORDER — SERTRALINE HCL 25 MG PO TABS
25.0000 mg | ORAL_TABLET | Freq: Every day | ORAL | 11 refills | Status: DC
Start: 2020-09-15 — End: 2020-09-24

## 2020-09-15 NOTE — Progress Notes (Signed)
Subjective:    Patient ID: Jaime [name], male    DOB: 07-04-1996, 24 y.o.   MRN: [id number]  HPI   Patient is a 24 yr old autistic male with fast relapsing-remitting MS- with 4 exacerbations now in <1 yr- with autonomic dysfunction.   Here for f/u.   Got infusion for MS relapse prevention- no side effects- doesn't feel bad- last 08/13/20 was when it was done. Doesn't feel like any effects from infusion. Explained all he should feel is no relapse if possible.    Having difficulties with BMs- constipation- stools are hard most of time.       Still gets off balance and still has tremors.  He is supposed to see Neurology on 09/24/20-  To discuss MS and tremors.    No more therapy lately.  Is walking a lot- going up and down stairs.  Uses RW when goes outside, otherwise furniture walking.    No pain.     Pain Inventory Average Pain 0 Pain Right Now 0 My pain is na  LOCATION OF PAIN  na  BOWEL Number of stools per week: 7 Oral laxative use No  Type of laxative na Enema or suppository use No  History of colostomy No  Incontinent No   BLADDER Normal In and out cath, frequency na Able to self cath na Bladder incontinence No  Frequent urination No  Leakage with coughing No  Difficulty starting stream No  Incomplete bladder emptying No    Mobility walk with assistance how many minutes can you walk? 2 ability to climb steps?  yes do you drive?  no  Function disabled: date disabled .  Neuro/Psych weakness numbness tremor tingling spasms  Prior Studies Any changes since last visit?  no  Physicians involved in your care Any changes since last visit?  no   Family History  Problem Relation Age of Onset  . Clotting disorder Mother    Social History   Socioeconomic History  . Marital status: Single    Spouse name: Not on file  . Number of children: Not on file  . Years of education: Not on file  . Highest education level: Not on file   Occupational History  . Not on file  Tobacco Use  . Smoking status: Never Smoker  . Smokeless tobacco: Never Used  Vaping Use  . Vaping Use: Never used  Substance and Sexual Activity  . Alcohol use: No  . Drug use: Never  . Sexual activity: Not Currently  Other Topics Concern  . Not on file  Social History Narrative   Right handed    Caffeine use: soda sometimes   Lives with family   Social Determinants of Health   Financial Resource Strain: Not on file  Food Insecurity: Not on file  Transportation Needs: Not on file  Physical Activity: Not on file  Stress: Not on file  Social Connections: Not on file   Past Surgical History:  Procedure Laterality Date  . NO PAST SURGERIES     Past Medical History:  Diagnosis Date  . Autism   . Depression   . MS (multiple sclerosis) (HCC)    BP 111/70   Pulse 67   Temp 98.8 F (37.1 C)   Ht 5\' 9"  (1.753 m)   Wt 123 lb (55.8 kg)   SpO2 98%   BMI 18.16 kg/m   Opioid Risk Score:   Fall Risk Score:  `1  Depression screen PHQ 2/9  Depression screen  PHQ 2/9 01/09/2020  Decreased Interest 0  Down, Depressed, Hopeless 0  PHQ - 2 Score 0  Altered sleeping 0  Tired, decreased energy 0  Change in appetite 0  Feeling bad or failure about yourself  0  Trouble concentrating 0  Moving slowly or fidgety/restless 0  Suicidal thoughts 0  PHQ-9 Score 0    Review of Systems  Gastrointestinal: Positive for constipation.  Neurological: Positive for tremors, weakness and numbness.  All other systems reviewed and are negative.      Objective:   Physical Exam  Awake, alert, appropriate, constantly moving LLE- like nervous tic, but isn't nervous, accompanied by mother, NAD- brighter affect;  MS: LE's- HF 4/5, KE 4+/5, KF 4-/5, DF 4/5, PF 4-/5 B/L  Neuro: No hoffman's B/L Intact to light in UEs B/L; decreased sensation in LEs L1-S2 B/L No clonus B/L MAS of 1+ in RLE; MAS of 1 in LLE- a catch in B/L knees Tremors of LLE- not  spasticity    Assessment & Plan:   Patient is a 24 yr old autistic male with fast relapsing-remitting MS- with 4 exacerbations now in <1 yr- with autonomic dysfunction.   1. Chronic constipation- Can do apple juice or Grape juice. To improve constipation- doesn't have to use prune juice.  If that doesn't work, 1/2 dose Miralax /day/day or 1 senokot/day.   2 Poor balance-.  Con't furniture walk inside the house; but Rolling walker outside the house.  3. Has mild spasticity- but don't want to treat will interfere with walking.   4. Spasticity- So don't want to increase Baclofen at this time- because tone can make him appear stronger- Baclofen side effects at higher doses is sedation and constipation- so don't want to interfere with walking OR cause increased side effects.   5. MS- refill Baclofen 5 mg 3x/day with 1 year's refills  6. Depression- Refill and con't Zoloft 25 mg daily- discussed increasing, but mood much better- 1 year's refill  7. Autonomic dysfunction- Con't Midodrine 10 mg 2x/day- #60- 1 year's supply- cannot reduce because BP  Is still somewhat soft- 11/70 WITH Midodrine, so CANNOT remove/reduce dose.   8. F/U in 2-3 months   I spent a total of 30  minutes on visit- as detailed above.

## 2020-09-15 NOTE — Patient Instructions (Addendum)
Patient is a 24 yr old autistic male with fast relapsing-remitting MS- with 4 exacerbations now in <1 yr- with autonomic dysfunction.   1. Constipation- chronic- Can do apple juice or Grape juice. To improve constipation- doesn't have to use prune juice.  If that doesn't work, 1/2 dose Miralax /day/day or 1 senokot/day.   2. Poor balance- Con't furniture walk inside the house; but Rolling walker outside the house.  3. Has mild spasticity- but don't want to treat will interfere with walking.   4. Spasticity- So don't want to increase Baclofen at this time- because tone can make him appear stronger- Baclofen side effects at higher doses is sedation and constipation- so don't want to interfere with walking OR cause increased side effects.   5. MS- refill Baclofen 5 mg 3x/day with 1 year's refills  6. Depression- Refill and con't Zoloft 25 mg daily- discussed increasing, but mood much better- 1 year's refill  7. Autonomic dysfunction- Con't Midodrine 10 mg 2x/day- #60- 1 year's supply- cannot reduce because BP  Is still somewhat soft- 11/70 WITH Midodrine, so CANNOT remove/reduce dose.   8. F/U in 2-3 months

## 2020-09-24 ENCOUNTER — Encounter: Payer: Self-pay | Admitting: Neurology

## 2020-09-24 ENCOUNTER — Other Ambulatory Visit: Payer: Self-pay

## 2020-09-24 ENCOUNTER — Ambulatory Visit (INDEPENDENT_AMBULATORY_CARE_PROVIDER_SITE_OTHER): Payer: Medicaid Other | Admitting: Neurology

## 2020-09-24 VITALS — BP 113/74 | HR 94 | Ht 70.0 in | Wt 122.0 lb

## 2020-09-24 DIAGNOSIS — G35 Multiple sclerosis: Secondary | ICD-10-CM

## 2020-09-24 DIAGNOSIS — F840 Autistic disorder: Secondary | ICD-10-CM | POA: Diagnosis not present

## 2020-09-24 DIAGNOSIS — Z79899 Other long term (current) drug therapy: Secondary | ICD-10-CM

## 2020-09-24 DIAGNOSIS — I498 Other specified cardiac arrhythmias: Secondary | ICD-10-CM

## 2020-09-24 DIAGNOSIS — R2689 Other abnormalities of gait and mobility: Secondary | ICD-10-CM

## 2020-09-24 DIAGNOSIS — G35D Multiple sclerosis, unspecified: Secondary | ICD-10-CM

## 2020-09-24 DIAGNOSIS — G90A Postural orthostatic tachycardia syndrome (POTS): Secondary | ICD-10-CM

## 2020-09-24 DIAGNOSIS — F32A Depression, unspecified: Secondary | ICD-10-CM

## 2020-09-24 MED ORDER — SERTRALINE HCL 50 MG PO TABS
50.0000 mg | ORAL_TABLET | Freq: Every day | ORAL | 4 refills | Status: DC
Start: 2020-09-24 — End: 2021-08-31

## 2020-09-24 NOTE — Progress Notes (Addendum)
[facility]  PATIENT: Jaime [name] DOB: 09-25-1996  REFERRING DOCTOR OR PCP: Sherald Barge, MD SOURCE: Patient, notes from hospital, imaging and lab reports, MRI images personally reviewed.  _________________________________   HISTORICAL  CHIEF COMPLAINT:  Chief Complaint  Patient presents with  . 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     Update 09/24/2020: He had his last Tysabri in November.76,[id number] Jaime [name] Long).  He is JCV Ab negative  He is walking better but still needs a walker.    He has no recent falls.   He feels hands are strong but he has some clumsiness and tremor. He has some spasticity helped by baclofen.       He has some urinary dribble incontinence but no frank incontinence.   He has constipation.    He sleeps well most nights now.    He notes mild depression and anxiety.   He feels cognition is doing about the same as before the MS but he has some word finding slowness at times - better than earlir.   His great great uncle had MS.     He has not had the Covid-19 vaccination.   Medical issues include POTS, autism spectrum disorder and depression  MS history: He was diagnosed with MS March 2021 after presenting with poor balance, disorientation.  At the time, he was walking his dog and collapsed prompting a visit to the ED.   MRI's of the head and spine showed multiple lesions c/w MS.   In retrospect, he had some imbalance in February but was walking at his baseline January 2021.   Multiple brain and T-spine lesions enhanced.   He ws admitted and received 5 days of IV solu-medrol.   He improved but not to baseline.  He did outpatient rehab.  He never saw neurology as outpatient.    In August 2021, he began to have more difficulty with his gait and re-presented to the ED 05/19/2020.    It showed numerous enhancing lesions and he was  admitted for 5 more days of IV Solu-medrol.    He improved some but is not at baseline.  Imaging review: MRI of the brain 05/19/2020 showed multiple T2/FLAIR hyperintense foci in the periventricular, juxtacortical and deep white matter.  There are also foci in the cerebellum, left middle cerebellar peduncle and brainstem.  Multiple lesions enhance including some in the hemispheres, cerebellum and middle cerebellar peduncle.  Foci that enhanced on the March 2021 MRI appear smaller and do not enhance  MRI of the brain  12/16/2019 shows Multiple T2/FLAIR hyperintense foci in the periventricular, juxtacortical and deep white matter of the hemispheres.  Additionally, foci are noted in the brainstem and cerebellum enhancement.  MRI of the brain 12/11/2019 shows multiple T2/FLAIR hyperintense foci in the periventricular, juxtacortical and deep white matter of the hemispheres.  Many of the periventricular foci are radially oriented to the ventricles.  Foci are noted in the deep gray matter, brainstem and cerebellar peduncles multiple lesions enhance.  MRI of the cervical spine 12/11/2019 shows extensive T2 hyperintense foci in the spinal cord the most prominent focus is posteriorly to the right at C2.  None of the foci enhanced.  MRI of the thoracic spine 12/11/2019 showed multiple T2 hyperintense foci within the spinal cord.  There was enhancement at T1-T2 and T10-T11.  MRI of the lumbar spine 12/11/2019 showed some demyelination in the distal spinal cord but no enhancement.  There is a small disc protrusion at L5-S1 that does not cause nerve root compression or spinal stenosis.  Laboratory tests: 06/19/2020: Hep B surface antibody positive.  Hep B surface antigen and hep C core antibody are negative.   QuantiFERON-TB is negative VZV IgG shows that he is not immune to varicella.  Labs done March 2021 admission: RPR negative, HIV negative, ACE normal.  NMO antibody was negative.  SARS-CoV-2 PCR was  negative.  Cerebrospinal fluid December 12, 2019: IgG index 1.1 (this is very elevated consistent with MS, oligoclonal bands was not performed).  CSF was otherwise normal. .    REVIEW OF SYSTEMS: Constitutional: No fevers, chills, sweats, or change in appetite.  He notes fatigue Eyes: No visual changes, double vision, eye pain Ear, nose and throat: No hearing loss, ear pain, nasal congestion, sore throat Cardiovascular: No chest pain, palpitations Respiratory: No shortness of breath at rest or with exertion.   No wheezes GastrointestinaI: No nausea, vomiting, diarrhea, abdominal pain, fecal incontinence Genitourinary: No dysuria, urinary retention or frequency.  No nocturia. Musculoskeletal: No neck pain, back pain Integumentary: No rash, pruritus, skin lesions Neurological: as above Psychiatric: As above Endocrine: No palpitations, diaphoresis, change in appetite, change in weigh or increased thirst Hematologic/Lymphatic: No anemia, purpura, petechiae. Allergic/Immunologic: No itchy/runny eyes, nasal congestion, recent allergic reactions, rashes  ALLERGIES: No Known Allergies  HOME MEDICATIONS:  Current Outpatient Medications:  .  acetaminophen (TYLENOL) 325 MG tablet, Take 2 tablets (650 mg total) by mouth every 4 (four) hours as needed for mild pain or fever (or Fever >/= 101)., Disp: , Rfl:  .  Baclofen 5 MG TABS, Take 5 mg by mouth 3 (three) times daily., Disp: 90 tablet, Rfl: 11 .  docusate sodium (COLACE) 100 MG capsule, Take 1 capsule (100 mg total) by mouth 3 (three) times daily., Disp: 10 capsule, Rfl: 0 .  melatonin 3 MG TABS tablet, Take 2 tablets (6 mg total) by mouth at bedtime., Disp: 30 tablet, Rfl: 0 .  midodrine (PROAMATINE) 10 MG tablet, Take 1 tablet (10 mg total) by mouth 2 (two) times daily with a meal., Disp: 60 tablet, Rfl: 11 .  pantoprazole (PROTONIX) 40 MG tablet, Take 1 tablet (40 mg total) by mouth daily., Disp: 30 tablet, Rfl: 0 .  sertraline (ZOLOFT)  50 MG tablet, Take 1 tablet (50 mg total) by mouth at bedtime., Disp: 90 tablet, Rfl: 4  PAST MEDICAL HISTORY: Past Medical History:  Diagnosis Date  . Autism   . Depression   . MS (multiple sclerosis) (HCC)     PAST SURGICAL HISTORY: Past Surgical History:  Procedure Laterality Date  . NO PAST SURGERIES      FAMILY HISTORY: Family History  Problem Relation Age of Onset  . Clotting disorder Mother     SOCIAL HISTORY:  Social History   Socioeconomic History  . Marital status: Single    Spouse name: Not on file  . Number of children: Not on file  . Years of education: Not on file  . Highest education level: Not on file  Occupational History  . Not on file  Tobacco Use  . Smoking status: Never Smoker  . Smokeless tobacco: Never Used  Vaping Use  . Vaping Use: Never used  Substance and Sexual Activity  . Alcohol use: No  . Drug use: Never  . Sexual activity: Not Currently  Other Topics  Concern  . Not on file  Social History Narrative   Right handed    Caffeine use: soda sometimes   Lives with family   Social Determinants of Health   Financial Resource Strain: Not on file  Food Insecurity: Not on file  Transportation Needs: Not on file  Physical Activity: Not on file  Stress: Not on file  Social Connections: Not on file  Intimate Partner Violence: Not on file     PHYSICAL EXAM  Vitals:   09/24/20 1541  BP: 113/74  Pulse: 94  Weight: 122 lb (55.3 kg)  Height: 5\' 10"  (1.778 m)    Body mass index is 17.51 kg/m.   General: The patient is well-developed and well-nourished and in no acute distress  HEENT:  Head is [place]/AT.  Sclera are anicteric.     Skin: Extremities are without rash or  edema.  Musculoskeletal:  Back is nontender  Neurologic Exam  Mental status: The patient is alert and oriented x 3 at the time of the examination. The patient has apparent normal recent and remote memory, with an apparently normal attention span and  concentration ability.   Speech is normal.  Cranial nerves: Extraocular movements are full. He has a right APD. Reduced color vision OD.  Facial strength and sensation is normal.  No obvious hearing deficits are noted.  Motor:  Muscle bulk is normal.   Tone is normal. Strength was 5/5 in the arms and left leg, 4+/5 in the iliopsoas and ankle/toe extensors and 5/5 elsewhere in the right leg.  Sensory: Sensory testing is intact to touch and vibration  Coordination: Cerebellar testing reveals reduced finger-nose-finger and reduced heel-to-shin bilaterally.  Gait and station: Station is normal.   Gait is wide and ataxic.  He requires unilateral support to take more than a few steps due to poor balance.  He is using a walker most of the time.  He cannot do a tandem walk. Tandem gait is normal. Romberg is positive.   Reflexes: Deep tendon reflexes are symmetric 3 in the arms and 3+ at the knees where he has crossed abductor reflexes.  He has nonsustained clonus at the ankles.    DIAGNOSTIC DATA (LABS, IMAGING, TESTING) - I reviewed patient records, labs, notes, testing and imaging myself where available.  Lab Results  Component Value Date   WBC 13.2 (H) 07/21/2020   HGB 15.1 07/21/2020   HCT 43.0 07/21/2020   MCV 90.0 07/21/2020   PLT 273 07/21/2020      Component Value Date/Time   NA 137 07/23/2020 0612   K 4.0 07/23/2020 0612   CL 103 07/23/2020 0612   CO2 25 07/23/2020 0612   GLUCOSE 89 07/23/2020 0612   BUN 15 07/23/2020 0612   CREATININE 0.98 07/26/2020 0420   CALCIUM 8.9 07/23/2020 0612   PROT 6.1 (L) 07/21/2020 0610   ALBUMIN 3.4 (L) 07/21/2020 0610   ALBUMIN 4.6 12/12/2019 0002   AST 15 07/21/2020 0610   ALT 16 07/21/2020 0610   ALKPHOS 40 07/21/2020 0610   BILITOT 0.5 07/21/2020 0610   GFRNONAA >60 07/26/2020 0420   GFRAA >60 06/02/2020 0516   No results found for: CHOL, HDL, LDLCALC, LDLDIRECT, TRIG, CHOLHDL Lab Results  Component Value Date   HGBA1C 5.3  12/14/2019   Lab Results  Component Value Date   VITAMINB12 678 12/11/2019   No results found for: TSH     ASSESSMENT AND PLAN  Multiple sclerosis (HCC) - Plan: IgG, IgA, IgM, CBC with  Differential/Platelet  High risk medication use - Plan: IgG, IgA, IgM, CBC with Differential/Platelet  Autism spectrum disorder  POTS (postural orthostatic tachycardia syndrome)  Poor balance  Depression, unspecified depression type   1.  Continue Tysabri.  Check labs.  Around time of next visit will check MRI brain and cervical spine 2.  Increase zoloft to 50 mg nightly 3..  rtc 6 months sooner if problems  Addendum: Initial note incorrectly stated that he was on Ocrevus. He is on Tysabri.     Stacia Feazell A. Epimenio Foot, MD, [facility] 09/24/2020, 6:08 PM Certified in Neurology, Clinical Neurophysiology, Sleep Medicine and Neuroimaging  [facility] [address], [address] [place], [place] [address] [phone]

## 2020-09-25 LAB — CBC WITH DIFFERENTIAL/PLATELET
Basophils Absolute: 0.1 10*3/uL (ref 0.0–0.2)
Basos: 1 %
EOS (ABSOLUTE): 0.1 10*3/uL (ref 0.0–0.4)
Eos: 1 %
Hematocrit: 42.7 % (ref 37.5–51.0)
Hemoglobin: 14.9 g/dL (ref 13.0–17.7)
Immature Grans (Abs): 0 10*3/uL (ref 0.0–0.1)
Immature Granulocytes: 0 %
Lymphocytes Absolute: 2.9 10*3/uL (ref 0.7–3.1)
Lymphs: 23 %
MCH: 31.8 pg (ref 26.6–33.0)
MCHC: 34.9 g/dL (ref 31.5–35.7)
MCV: 91 fL (ref 79–97)
Monocytes Absolute: 0.8 10*3/uL (ref 0.1–0.9)
Monocytes: 7 %
Neutrophils Absolute: 8.7 10*3/uL — ABNORMAL HIGH (ref 1.4–7.0)
Neutrophils: 68 %
Platelets: 261 10*3/uL (ref 150–450)
RBC: 4.68 x10E6/uL (ref 4.14–5.80)
RDW: 12.9 % (ref 11.6–15.4)
WBC: 12.7 10*3/uL — ABNORMAL HIGH (ref 3.4–10.8)

## 2020-09-25 LAB — IGG, IGA, IGM
IgA/Immunoglobulin A, Serum: 171 mg/dL (ref 90–386)
IgG (Immunoglobin G), Serum: 1011 mg/dL (ref 603–1613)
IgM (Immunoglobulin M), Srm: 83 mg/dL (ref 20–172)

## 2020-09-30 ENCOUNTER — Telehealth: Payer: Self-pay | Admitting: *Deleted

## 2020-09-30 NOTE — Telephone Encounter (Signed)
Spoke to the patient's mother (on [place]) and provided her with Dr. Bonnita Hollow response to the lab results.

## 2020-10-16 ENCOUNTER — Telehealth: Payer: Self-pay | Admitting: Neurology

## 2020-10-16 NOTE — Telephone Encounter (Signed)
Dr. Epimenio Foot - I reviewed chart. I dont see any note about this. Can you please review and advise?

## 2020-10-16 NOTE — Telephone Encounter (Signed)
Marcelino Duster @ [facility] is asking if pt is on hold for infusions until June, please call [phone] and speak with anyone

## 2020-10-28 NOTE — Telephone Encounter (Signed)
[facility] Marcelino Duster) called, inquire if Pt is on June for infusion. Would like a call back from the nurse. Contact info: [phone].

## 2020-10-28 NOTE — Telephone Encounter (Signed)
He should be on Tysabri monthly. I did incorrectly put Ocrevus in my last note and have addended this.

## 2020-10-29 NOTE — Telephone Encounter (Addendum)
Tried calling mother  Hermelinda Medicus DPR) at [phone]. VM full, unable to LVM.  Tried home # [phone]. LVM for pt to call office back.  Pt receives Tysabri infusions at [facility]. Last one I see completed was on 08/13/20. Pt should be receiving monthly. Phone number to [facility] for pt to call and get scheduled: [phone]  I called [facility] back. Offices are currently closed. Hours: Mon-Fri 830am-8pm

## 2020-10-29 NOTE — Telephone Encounter (Addendum)
Called [facility] back and spoke with [place]. She states pt/mother reached out on 10/16/2020 to advised Tysabri infusions on hold until 03/2021. They wanted to confirm this. Advised we did not place infusions on hold. We have LVM for pt/mother to get further info as to why it is on hold. Waiting to hear back. She will make note on their end. Would like update once we know more.  Called and spoke with mother, Jaime [name]. They thought infusion was q 6 months. There was some confusion. Made her aware infusion is monthly. I provided her [facility] phone# to call and get infusion set up. Confirmed she has address. Advised it was [address]. She will call back if she runs into any more issues.   Called [facility] back and provided update. Spoke with [facility]. Nothing further needed.

## 2020-11-17 ENCOUNTER — Ambulatory Visit: Payer: Medicaid Other | Admitting: Physical Medicine and Rehabilitation

## 2020-11-17 ENCOUNTER — Other Ambulatory Visit: Payer: Self-pay

## 2020-11-17 ENCOUNTER — Encounter: Payer: Self-pay | Admitting: Physical Medicine and Rehabilitation

## 2020-11-17 ENCOUNTER — Encounter
Payer: Medicaid Other | Attending: Physical Medicine and Rehabilitation | Admitting: Physical Medicine and Rehabilitation

## 2020-11-17 VITALS — BP 107/81 | HR 84 | Temp 98.1°F | Ht 70.0 in | Wt 121.0 lb

## 2020-11-17 DIAGNOSIS — G90A Postural orthostatic tachycardia syndrome (POTS): Secondary | ICD-10-CM

## 2020-11-17 DIAGNOSIS — R2689 Other abnormalities of gait and mobility: Secondary | ICD-10-CM | POA: Insufficient documentation

## 2020-11-17 DIAGNOSIS — G35 Multiple sclerosis: Secondary | ICD-10-CM | POA: Insufficient documentation

## 2020-11-17 DIAGNOSIS — F840 Autistic disorder: Secondary | ICD-10-CM | POA: Insufficient documentation

## 2020-11-17 DIAGNOSIS — I498 Other specified cardiac arrhythmias: Secondary | ICD-10-CM | POA: Insufficient documentation

## 2020-11-17 NOTE — Patient Instructions (Signed)
Patient is a 25 yr old autistic male with fast relapsing-remitting MS- with 4 exacerbations now in <1 yr- with autonomic dysfunction.  With depression and constipation and orthostatic hypotension   1. Try Grape juice or apple juice to poop- or grapes in general. If that doesn't work, can take 1 senokot per day.   2. Zoloft- is at 50 mg daily now- can take day or night- but sometimes can interfere with sleep- - sleeping well.   3  Appears Tysabri is every 4 weeks. Not q6 months. Per Dr Epimenio Foot Explained the confusion could be used to be q6 months.   4. Symptoms have stabilized on IV Tysabri  5. BMI 17.36- weight is 121 lbs. Needs to gain weight.  Talk to PCP about weight gain meds.  Protein based foods.    6. F/U in 3 months- continue Midodrine for low BP.

## 2020-11-17 NOTE — Progress Notes (Signed)
Subjective:    Patient ID: Jaime [name], male    DOB: 1996/07/24, 25 y.o.   MRN: [id number]  HPI  Patient is a 25 yr old autistic male with fast relapsing-remitting MS- with 4 exacerbations now in <1 yr- with autonomic dysfunction.    Excited hasn't gotten stuck with needle this time.    No passing out feeling but had some dizziness a week ago or so- was major- didn't pass out. Sat down due to lightheadedness.  Knows how to keep depression in control- think it's pretty good, per Molli Hazard. Taking Zoloft 50 mg daily for depression.   Constipation- not an issue anymore-  No accidents so far- BMs are hard "rarely> Pooping OK without much intervention.  Stools are pretty solid- hard to push out sometimes.   Still needs help to walk outside- or a RW.  Can't stay up for long.      Pain Inventory Average Pain 0 Pain Right Now 0 My pain is tingling and numbness  LOCATION OF PAIN  Hands, feets, legs  BOWEL Number of stools per week: 7 Oral laxative use No  Type of laxative None Enema or suppository use No  History of colostomy No  Incontinent No   BLADDER Normal In and out cath, frequency N/A Able to self cath N/A Bladder incontinence No  Frequent urination No  Leakage with coughing No  Difficulty starting stream No  Incomplete bladder emptying No    Mobility walk with assistance use a walker how many minutes can you walk? 1-2 MINS ability to climb steps?  yes do you drive?  no use a wheelchair transfers alone  Function disabled: date disabled At age 41  or 25 yrs old. I need assistance with the following:  bathing, meal prep, household duties and shopping Do you have any goals in this area?  yes  Neuro/Psych weakness numbness tremor tingling trouble walking spasms  Prior Studies Any changes since last visit?  no  Physicians involved in your care Any changes since last visit?  no   Family History  Problem Relation Age of Onset  . Clotting  disorder Mother    Social History   Socioeconomic History  . Marital status: Single    Spouse name: Not on file  . Number of children: Not on file  . Years of education: Not on file  . Highest education level: Not on file  Occupational History  . Not on file  Tobacco Use  . Smoking status: Never Smoker  . Smokeless tobacco: Never Used  Vaping Use  . Vaping Use: Never used  Substance and Sexual Activity  . Alcohol use: No  . Drug use: Never  . Sexual activity: Not Currently  Other Topics Concern  . Not on file  Social History Narrative   Right handed    Caffeine use: soda sometimes   Lives with family   Social Determinants of Health   Financial Resource Strain: Not on file  Food Insecurity: Not on file  Transportation Needs: Not on file  Physical Activity: Not on file  Stress: Not on file  Social Connections: Not on file   Past Surgical History:  Procedure Laterality Date  . NO PAST SURGERIES     Past Medical History:  Diagnosis Date  . Autism   . Depression   . MS (multiple sclerosis) (HCC)    There were no vitals taken for this visit.  Opioid Risk Score:   Fall Risk Score:  `1  Depression  screen PHQ 2/9  Depression screen PHQ 2/9 01/09/2020  Decreased Interest 0  Down, Depressed, Hopeless 0  PHQ - 2 Score 0  Altered sleeping 0  Tired, decreased energy 0  Change in appetite 0  Feeling bad or failure about yourself  0  Trouble concentrating 0  Moving slowly or fidgety/restless 0  Suicidal thoughts 0  PHQ-9 Score 0   Review of Systems  Musculoskeletal: Positive for gait problem.       MS  All other systems reviewed and are negative.      Objective:   Physical Exam  Awake, alert, on phone most of visit, accompanied by mother, NAD MS: RLE- HF 4-/5, KE 3+/5, DF 4-/5, PF 4/5 LLE- HF 4-/5, KE/DF/PF 4-/5  Neuro: MAS of 1+ in R elbow, R wrist MAS of 0 in LUE MAS of RLE 1+ to 2 in R hip, knee and ankle MAS of LLE- 1+- in same joints (+)  homffan's in RUE, not LUE No clonus seen  Some ataxia noted in LEs esp Even being seated Few hand tremors seen/walks with RW    Assessment & Plan:   Patient is a 25 yr old autistic male with fast relapsing-remitting MS- with 4 exacerbations now in <1 yr- with autonomic dysfunction.  With depression and constipation   1. Try Grape juice or apple juice to poop- or grapes in general. If that doesn't work, can take 1 senokot per day.   2. Zoloft- is at 50 mg daily now- can take day or night- but sometimes can interfere with sleep- - sleeping well.   3  Appears Tysabri is every 4 weeks. Not q6 months. Per Dr Epimenio Foot Explained the confusion could be used to be q6 months.   4. Symptoms have stabilized on IV Tysabri  5. BMI 17.36- weight is 121 lbs. Needs to gain weight.  Talk to PCP about weight gain meds.  Protein based foods.    6. F/U in 3 months- continue Midodrine for low BP.   I spent a total of 35 minutes on visit- as detailed above.

## 2020-12-01 NOTE — Telephone Encounter (Signed)
I called patient's mother, [name].  I reminded her to schedule patient's Tysabri infusions.  These should be every month.  [name] reports that she is out of town at a conference with her daughter.  She will call back tomorrow to get the [facility] phone number.  When patient's mother calls back please give her the phone number of [phone] called to schedule patient's Tysabri infusion.

## 2020-12-10 ENCOUNTER — Non-Acute Institutional Stay (HOSPITAL_COMMUNITY)
Admission: RE | Admit: 2020-12-10 | Discharge: 2020-12-10 | Disposition: A | Discharge status: Home / Self Care | Payer: Medicaid Other | Source: Ambulatory Visit | Attending: Internal Medicine | Admitting: Internal Medicine

## 2020-12-10 ENCOUNTER — Other Ambulatory Visit: Payer: Self-pay

## 2020-12-10 DIAGNOSIS — G35 Multiple sclerosis: Secondary | ICD-10-CM | POA: Diagnosis present

## 2020-12-10 MED ORDER — SODIUM CHLORIDE 0.9 % IV SOLN
300.0000 mg | INTRAVENOUS | Status: DC
  Administered 2020-12-10: 300 mg via INTRAVENOUS
  Filled 2020-12-10: qty 15

## 2020-12-10 MED ORDER — SODIUM CHLORIDE 0.9 % IV SOLN
INTRAVENOUS | Status: DC | PRN
  Administered 2020-12-10: 250 mL via INTRAVENOUS

## 2020-12-10 NOTE — Discharge Instructions (Signed)
Natalizumab injection What is this medicine? NATALIZUMAB (na ta LIZ you mab) is used to treat relapsing multiple sclerosis. This drug is not a cure. It is also used to treat Crohn's disease. This medicine may be used for other purposes; ask your health care provider or pharmacist if you have questions. COMMON BRAND NAME(S): Tysabri What should I tell my health care provider before I take this medicine? They need to know if you have any of these conditions:  immune system problems  progressive multifocal leukoencephalopathy (PML)  an unusual or allergic reaction to natalizumab, other medicines, foods, dyes, or preservatives  pregnant or trying to get pregnant  breast-feeding How should I use this medicine? This medicine is for infusion into a vein. It is given by a health care professional in a hospital or clinic setting. A special MedGuide will be given to you by the pharmacist with each prescription and refill. Be sure to read this information carefully each time. Talk to your pediatrician regarding the use of this medicine in children. This medicine is not approved for use in children. Overdosage: If you think you have taken too much of this medicine contact a poison control center or emergency room at once. NOTE: This medicine is only for you. Do not share this medicine with others. What if I miss a dose? It is important not to miss your dose. Call your doctor or health care professional if you are unable to keep an appointment. What may interact with this medicine? Do not take this medicine with any of the following medications:  biologic medicines such as adalimumab, certolizumab, etanercept, golimumab, infliximab This medicine may also interact with the following medications:  azathioprine  cyclosporine  interferons  6-mercaptopurine  methotrexate  other medicines that lower your chance of fighting an infection  steroid medicines like prednisone or  cortisone  vaccines This list may not describe all possible interactions. Give your health care provider a list of all the medicines, herbs, non-prescription drugs, or dietary supplements you use. Also tell them if you smoke, drink alcohol, or use illegal drugs. Some items may interact with your medicine. What should I watch for while using this medicine? Your condition will be monitored carefully while you are receiving this medicine. Visit your doctor for regular check ups. Tell your doctor or healthcare professional if your symptoms do not start to get better or if they get worse. Stay away from people who are sick. Call your doctor or health care professional for advice if you get a fever, chills or sore throat, or other symptoms of a cold or flu. Do not treat yourself. In some patients, this medicine may cause a serious brain infection that may cause death. If you have any problems seeing, thinking, speaking, walking, or standing, tell your doctor right away. If you cannot reach your doctor, get urgent medical care. What side effects may I notice from receiving this medicine? Side effects that you should report to your doctor or health care professional as soon as possible:  allergic reactions like skin rash, itching or hives, swelling of the face, lips, or tongue  breathing problems  changes in vision  chest pain  confusion  depressed mood  dizziness  feeling faint; lightheaded; falls  general ill feeling or flu-like symptoms  loss of memory  missed menstrual periods  muscle weakness  problems with balance, talking, or walking  signs and symptoms of liver injury like dark yellow or brown urine; general ill feeling or flu-like symptoms; light-colored   stools; loss of appetite; nausea; right upper belly pain; unusually weak or tired; yellowing of the eyes or skin  suicidal thoughts, mood changes  unusual bruising or bleeding  unusually weak or tired Side effects that  usually do not require medical attention (report to your doctor or health care professional if they continue or are bothersome):  headache  joint pain  muscle cramps  muscle pain  nausea, vomiting  pain, redness, or irritation at site where injected  tiredness This list may not describe all possible side effects. Call your doctor for medical advice about side effects. You may report side effects to FDA at 1-800-FDA-1088. Where should I keep my medicine? This drug is given in a hospital or clinic and will not be stored at home. NOTE: This sheet is a summary. It may not cover all possible information. If you have questions about this medicine, talk to your doctor, pharmacist, or health care provider.  2021 Elsevier/Gold Standard (2019-03-26 13:20:26)  

## 2020-12-10 NOTE — Progress Notes (Signed)
PATIENT CARE CENTER NOTE  Diagnosis: Multiple Sclerosis G35   Provider: Despina Arias, MD   Procedure: Tysabri infusion    Note: Patient received Tysabri infusion via PIV. Tolerated well with no adverse reaction. Observed patient for 1 hour post-infusion. Vital signs stable. Discharge instructions given. Patient alert, oriented and ambulatory to wheelchair at discharge. Discharged home with mother.

## 2021-01-07 ENCOUNTER — Non-Acute Institutional Stay (HOSPITAL_COMMUNITY)
Admission: RE | Admit: 2021-01-07 | Discharge: 2021-01-07 | Disposition: A | Discharge status: Home / Self Care | Payer: Medicaid Other | Source: Ambulatory Visit | Attending: Internal Medicine | Admitting: Internal Medicine

## 2021-01-07 ENCOUNTER — Other Ambulatory Visit: Payer: Self-pay

## 2021-01-07 DIAGNOSIS — G35 Multiple sclerosis: Secondary | ICD-10-CM | POA: Insufficient documentation

## 2021-01-07 MED ORDER — SODIUM CHLORIDE 0.9 % IV SOLN
INTRAVENOUS | Status: DC | PRN
  Administered 2021-01-07: 250 mL via INTRAVENOUS

## 2021-01-07 MED ORDER — SODIUM CHLORIDE 0.9 % IV SOLN
300.0000 mg | INTRAVENOUS | Status: DC
  Administered 2021-01-07: 300 mg via INTRAVENOUS
  Filled 2021-01-07: qty 15

## 2021-01-07 NOTE — Discharge Instructions (Signed)
Natalizumab injection What is this medicine? NATALIZUMAB (na ta LIZ you mab) is used to treat relapsing multiple sclerosis. This drug is not a cure. It is also used to treat Crohn's disease. This medicine may be used for other purposes; ask your health care provider or pharmacist if you have questions. COMMON BRAND NAME(S): Tysabri What should I tell my health care provider before I take this medicine? They need to know if you have any of these conditions:  immune system problems  progressive multifocal leukoencephalopathy (PML)  an unusual or allergic reaction to natalizumab, other medicines, foods, dyes, or preservatives  pregnant or trying to get pregnant  breast-feeding How should I use this medicine? This medicine is for infusion into a vein. It is given by a health care professional in a hospital or clinic setting. A special MedGuide will be given to you by the pharmacist with each prescription and refill. Be sure to read this information carefully each time. Talk to your pediatrician regarding the use of this medicine in children. This medicine is not approved for use in children. Overdosage: If you think you have taken too much of this medicine contact a poison control center or emergency room at once. NOTE: This medicine is only for you. Do not share this medicine with others. What if I miss a dose? It is important not to miss your dose. Call your doctor or health care professional if you are unable to keep an appointment. What may interact with this medicine? Do not take this medicine with any of the following medications:  biologic medicines such as adalimumab, certolizumab, etanercept, golimumab, infliximab This medicine may also interact with the following medications:  azathioprine  cyclosporine  interferons  6-mercaptopurine  methotrexate  other medicines that lower your chance of fighting an infection  steroid medicines like prednisone or  cortisone  vaccines This list may not describe all possible interactions. Give your health care provider a list of all the medicines, herbs, non-prescription drugs, or dietary supplements you use. Also tell them if you smoke, drink alcohol, or use illegal drugs. Some items may interact with your medicine. What should I watch for while using this medicine? Your condition will be monitored carefully while you are receiving this medicine. Visit your doctor for regular check ups. Tell your doctor or healthcare professional if your symptoms do not start to get better or if they get worse. Stay away from people who are sick. Call your doctor or health care professional for advice if you get a fever, chills or sore throat, or other symptoms of a cold or flu. Do not treat yourself. In some patients, this medicine may cause a serious brain infection that may cause death. If you have any problems seeing, thinking, speaking, walking, or standing, tell your doctor right away. If you cannot reach your doctor, get urgent medical care. What side effects may I notice from receiving this medicine? Side effects that you should report to your doctor or health care professional as soon as possible:  allergic reactions like skin rash, itching or hives, swelling of the face, lips, or tongue  breathing problems  changes in vision  chest pain  confusion  depressed mood  dizziness  feeling faint; lightheaded; falls  general ill feeling or flu-like symptoms  loss of memory  missed menstrual periods  muscle weakness  problems with balance, talking, or walking  signs and symptoms of liver injury like dark yellow or brown urine; general ill feeling or flu-like symptoms; light-colored   stools; loss of appetite; nausea; right upper belly pain; unusually weak or tired; yellowing of the eyes or skin  suicidal thoughts, mood changes  unusual bruising or bleeding  unusually weak or tired Side effects that  usually do not require medical attention (report to your doctor or health care professional if they continue or are bothersome):  headache  joint pain  muscle cramps  muscle pain  nausea, vomiting  pain, redness, or irritation at site where injected  tiredness This list may not describe all possible side effects. Call your doctor for medical advice about side effects. You may report side effects to FDA at 1-800-FDA-1088. Where should I keep my medicine? This drug is given in a hospital or clinic and will not be stored at home. NOTE: This sheet is a summary. It may not cover all possible information. If you have questions about this medicine, talk to your doctor, pharmacist, or health care provider.  2021 Elsevier/Gold Standard (2019-03-26 13:20:26)  

## 2021-01-07 NOTE — Progress Notes (Signed)
PATIENT CARE CENTER NOTE  Diagnosis: Multiple Sclerosis G35   Provider: Despina Arias, MD   Procedure: Tysabri infusion    Note: Patient received Tysabri infusion via PIV. Tolerated well with no adverse reaction. Observed patient for 1 hour post-infusion. Vital signs stable. Discharge instructions given. Patient alert, oriented and ambulatory to wheelchair at discharge. Discharged home with mother.

## 2021-01-12 ENCOUNTER — Telehealth: Payer: Self-pay | Admitting: *Deleted

## 2021-01-12 NOTE — Telephone Encounter (Signed)
LVM for pt to call office. He is due for labs before his next Tysabri infusion. If he calls, please schedule appt for this week.  Needs JCV antibody/CBC w/ diff/platelet.  Called mother, Dawn at [phone]. He cannot come this week for labs. Scheduled lab appt for 01/22/21 at 4:30pm. Mother would like reminder call the day before.

## 2021-01-21 NOTE — Telephone Encounter (Signed)
Tried calling mother, went to VM, VM not set up. If she calls, please remind her of lab appt for her son tomorrow at 4:30pm. She wanted a call the day before as a reminder, thank you

## 2021-01-22 ENCOUNTER — Other Ambulatory Visit (INDEPENDENT_AMBULATORY_CARE_PROVIDER_SITE_OTHER): Payer: Self-pay

## 2021-01-22 ENCOUNTER — Other Ambulatory Visit: Payer: Self-pay | Admitting: *Deleted

## 2021-01-22 ENCOUNTER — Telehealth: Payer: Self-pay | Admitting: *Deleted

## 2021-01-22 DIAGNOSIS — G35 Multiple sclerosis: Secondary | ICD-10-CM

## 2021-01-22 DIAGNOSIS — Z79899 Other long term (current) drug therapy: Secondary | ICD-10-CM

## 2021-01-22 DIAGNOSIS — Z0289 Encounter for other administrative examinations: Secondary | ICD-10-CM

## 2021-01-22 NOTE — Telephone Encounter (Signed)
Placed JCV lab in [facility] lock box for routine lab pick up. Results pending. 

## 2021-01-22 NOTE — Telephone Encounter (Signed)
Tried calling mother again at [phone]. VM not set up.

## 2021-01-23 LAB — CBC WITH DIFFERENTIAL/PLATELET
Basophils Absolute: 0.1 10*3/uL (ref 0.0–0.2)
Basos: 1 %
EOS (ABSOLUTE): 0.3 10*3/uL (ref 0.0–0.4)
Eos: 3 %
Hematocrit: 45.7 % (ref 37.5–51.0)
Hemoglobin: 15.2 g/dL (ref 13.0–17.7)
Immature Grans (Abs): 0 10*3/uL (ref 0.0–0.1)
Immature Granulocytes: 0 %
Lymphocytes Absolute: 3.8 10*3/uL — ABNORMAL HIGH (ref 0.7–3.1)
Lymphs: 35 %
MCH: 29.9 pg (ref 26.6–33.0)
MCHC: 33.3 g/dL (ref 31.5–35.7)
MCV: 90 fL (ref 79–97)
Monocytes Absolute: 1 10*3/uL — ABNORMAL HIGH (ref 0.1–0.9)
Monocytes: 9 %
Neutrophils Absolute: 5.6 10*3/uL (ref 1.4–7.0)
Neutrophils: 52 %
Platelets: 245 10*3/uL (ref 150–450)
RBC: 5.09 x10E6/uL (ref 4.14–5.80)
RDW: 13.6 % (ref 11.6–15.4)
WBC: 10.8 10*3/uL (ref 3.4–10.8)

## 2021-01-28 ENCOUNTER — Telehealth: Payer: Self-pay | Admitting: *Deleted

## 2021-01-28 LAB — STRATIFY JCV AB (W/ INDEX) W/ RFLX
Index Value: 0.16
Stratify JCV (TM) Ab w/Reflex Inhibition: NEGATIVE

## 2021-01-28 NOTE — Telephone Encounter (Signed)
Called [phone] and LVM for pt to call office Called [phone]. VM not set up to leave message.

## 2021-01-28 NOTE — Telephone Encounter (Signed)
-----   Message from Asa Lente, MD sent at 01/28/2021  8:15 AM EDT ----- Please let him know that the JCV looks good.  It is still negative.

## 2021-01-28 NOTE — Telephone Encounter (Signed)
Called and spoke with mother. Relayed results per Dr. Bonnita Hollow note. He has next infusion 02/06/21.

## 2021-01-28 NOTE — Telephone Encounter (Signed)
Pt's mother, Jaime [name] (on [place]) returning phone to discuss results. Please call [phone]

## 2021-02-06 ENCOUNTER — Encounter (HOSPITAL_COMMUNITY): Payer: Medicaid Other

## 2021-02-11 ENCOUNTER — Non-Acute Institutional Stay (HOSPITAL_COMMUNITY)
Admission: RE | Admit: 2021-02-11 | Discharge: 2021-02-11 | Disposition: A | Discharge status: Home / Self Care | Payer: Medicaid Other | Source: Ambulatory Visit | Attending: Internal Medicine | Admitting: Internal Medicine

## 2021-02-11 ENCOUNTER — Other Ambulatory Visit: Payer: Self-pay

## 2021-02-11 DIAGNOSIS — G35 Multiple sclerosis: Secondary | ICD-10-CM | POA: Insufficient documentation

## 2021-02-11 MED ORDER — SODIUM CHLORIDE 0.9 % IV SOLN
300.0000 mg | INTRAVENOUS | Status: DC
  Administered 2021-02-11: 300 mg via INTRAVENOUS
  Filled 2021-02-11: qty 15

## 2021-02-11 MED ORDER — SODIUM CHLORIDE 0.9 % IV SOLN: INTRAVENOUS | Status: DC | PRN

## 2021-02-11 NOTE — Progress Notes (Signed)
CENTER NOTE   Diagnosis: Multiple Sclerosis       Provider: Despina Arias, MD     Procedure: Donnamarie Poag IV     Note: Patient received Tysabri infusion via PIV. Several attempts to gain IV access, by IV team and Anesthesia team. Tolerated infusion well with no adverse reaction. Vital signs stable. Discharge instructions given. Patient observed for 1 hour after infusion completed, no s/s of a reaction noted. Alert, oriented and stable at discharge, taken to lobby in wheelchair by Mother.

## 2021-02-11 NOTE — Discharge Instructions (Signed)
Natalizumab injection What is this medicine? NATALIZUMAB (na ta LIZ you mab) is used to treat relapsing multiple sclerosis. This drug is not a cure. It is also used to treat Crohn's disease. This medicine may be used for other purposes; ask your health care provider or pharmacist if you have questions. COMMON BRAND NAME(S): Tysabri What should I tell my health care provider before I take this medicine? They need to know if you have any of these conditions:  immune system problems  progressive multifocal leukoencephalopathy (PML)  an unusual or allergic reaction to natalizumab, other medicines, foods, dyes, or preservatives  pregnant or trying to get pregnant  breast-feeding How should I use this medicine? This medicine is for infusion into a vein. It is given by a health care professional in a hospital or clinic setting. A special MedGuide will be given to you by the pharmacist with each prescription and refill. Be sure to read this information carefully each time. Talk to your pediatrician regarding the use of this medicine in children. This medicine is not approved for use in children. Overdosage: If you think you have taken too much of this medicine contact a poison control center or emergency room at once. NOTE: This medicine is only for you. Do not share this medicine with others. What if I miss a dose? It is important not to miss your dose. Call your doctor or health care professional if you are unable to keep an appointment. What may interact with this medicine? Do not take this medicine with any of the following medications:  biologic medicines such as adalimumab, certolizumab, etanercept, golimumab, infliximab This medicine may also interact with the following medications:  azathioprine  cyclosporine  interferons  6-mercaptopurine  methotrexate  other medicines that lower your chance of fighting an infection  steroid medicines like prednisone or  cortisone  vaccines This list may not describe all possible interactions. Give your health care provider a list of all the medicines, herbs, non-prescription drugs, or dietary supplements you use. Also tell them if you smoke, drink alcohol, or use illegal drugs. Some items may interact with your medicine. What should I watch for while using this medicine? Your condition will be monitored carefully while you are receiving this medicine. Visit your doctor for regular check ups. Tell your doctor or healthcare professional if your symptoms do not start to get better or if they get worse. Stay away from people who are sick. Call your doctor or health care professional for advice if you get a fever, chills or sore throat, or other symptoms of a cold or flu. Do not treat yourself. In some patients, this medicine may cause a serious brain infection that may cause death. If you have any problems seeing, thinking, speaking, walking, or standing, tell your doctor right away. If you cannot reach your doctor, get urgent medical care. What side effects may I notice from receiving this medicine? Side effects that you should report to your doctor or health care professional as soon as possible:  allergic reactions like skin rash, itching or hives, swelling of the face, lips, or tongue  breathing problems  changes in vision  chest pain  confusion  depressed mood  dizziness  feeling faint; lightheaded; falls  general ill feeling or flu-like symptoms  loss of memory  missed menstrual periods  muscle weakness  problems with balance, talking, or walking  signs and symptoms of liver injury like dark yellow or brown urine; general ill feeling or flu-like symptoms; light-colored   stools; loss of appetite; nausea; right upper belly pain; unusually weak or tired; yellowing of the eyes or skin  suicidal thoughts, mood changes  unusual bruising or bleeding  unusually weak or tired Side effects that  usually do not require medical attention (report to your doctor or health care professional if they continue or are bothersome):  headache  joint pain  muscle cramps  muscle pain  nausea, vomiting  pain, redness, or irritation at site where injected  tiredness This list may not describe all possible side effects. Call your doctor for medical advice about side effects. You may report side effects to FDA at 1-800-FDA-1088. Where should I keep my medicine? This drug is given in a hospital or clinic and will not be stored at home. NOTE: This sheet is a summary. It may not cover all possible information. If you have questions about this medicine, talk to your doctor, pharmacist, or health care provider.  2021 Elsevier/Gold Standard (2019-03-26 13:20:26)  

## 2021-02-20 ENCOUNTER — Encounter: Payer: Medicaid Other | Admitting: Physical Medicine and Rehabilitation

## 2021-03-25 ENCOUNTER — Encounter: Payer: Self-pay | Admitting: Neurology

## 2021-03-25 ENCOUNTER — Ambulatory Visit (INDEPENDENT_AMBULATORY_CARE_PROVIDER_SITE_OTHER): Payer: Medicaid Other | Admitting: Neurology

## 2021-03-25 ENCOUNTER — Other Ambulatory Visit: Payer: Self-pay

## 2021-03-25 VITALS — BP 106/67 | HR 94 | Ht 70.0 in | Wt 122.5 lb

## 2021-03-25 DIAGNOSIS — G35 Multiple sclerosis: Secondary | ICD-10-CM | POA: Diagnosis not present

## 2021-03-25 DIAGNOSIS — Z79899 Other long term (current) drug therapy: Secondary | ICD-10-CM

## 2021-03-25 DIAGNOSIS — G90A Postural orthostatic tachycardia syndrome (POTS): Secondary | ICD-10-CM

## 2021-03-25 DIAGNOSIS — F840 Autistic disorder: Secondary | ICD-10-CM

## 2021-03-25 DIAGNOSIS — I498 Other specified cardiac arrhythmias: Secondary | ICD-10-CM

## 2021-03-25 DIAGNOSIS — R2689 Other abnormalities of gait and mobility: Secondary | ICD-10-CM | POA: Diagnosis not present

## 2021-03-25 MED ORDER — DULOXETINE HCL 60 MG PO CPEP: 60.0000 mg | ORAL_CAPSULE | Freq: Every day | ORAL | 3 refills | Status: DC

## 2021-03-25 NOTE — Progress Notes (Signed)
[facility]  PATIENT: Jaime [name] DOB: 1995/12/06  REFERRING DOCTOR OR PCP: Delbert Harness, MD SOURCE: Patient, notes from hospital, imaging and lab reports, MRI images personally reviewed.  _________________________________   HISTORICAL  CHIEF COMPLAINT:  Chief Complaint  Patient presents with   Follow-up    RM 13, with mother. Last seen 09/24/20. On Tysabri for MS. Last infusion: 02/11/21. Next: TBD. She thought he had infusion next week. Advised I did not see one scheduled. I provided phone # for her to call [facility].     HISTORY OF PRESENT ILLNESS:  Jaime [name] is a 25 y.o. 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     Update  03/25/2021: He is onTysabri in November.80,[name]).  He is JCV Ab negative  Around the house he uses furniture for balance or a walker.   He has no recent falls.   He feels hands are strong but he has some clumsiness and tremor. He has some spasticity helped by baclofen.     He has ankle clonus when leg flexed backwards   He has some urinary dribble incontinence but no frank incontinence.   He has constipation.    He sleeps well most nights now.    He notes mild depression and anxiety.   He feels cognition is doing about the same as before the MS but he has some word finding slowness at times - better than earlir.   His great great uncle had MS.     He has medical issues include POTS, autism spectrum disorder and depression.   Sertraline was prescribed for mood and depression but he prefers not to take it.    They will be doing [employer] trip soon and I showed the mom a few web sites that go over accessibility issues.    MS history: He was diagnosed with MS March 2021 after presenting with poor balance, disorientation.  At the time, he was walking his dog and collapsed prompting a visit to the ED.   MRI's of the head and spine showed multiple lesions c/w MS.   In retrospect, he had some imbalance in February but was walking at  his baseline January 2021.   Multiple brain and T-spine lesions enhanced.   He ws admitted and received 5 days of IV solu-medrol.   He improved but not to baseline.  He did outpatient rehab.  He never saw neurology as outpatient.    In August 2021, he began to have more difficulty with his gait and re-presented to the ED 05/19/2020.    It showed numerous enhancing lesions and he was admitted for 5 more days of IV Solu-medrol.    He improved some but is not at baseline.  Imaging review: MRI brain 07/15/2020 shows multiple T2/FLAIR hyperintense foci in the pons, middle cerebral hemispheres, cerebellum, spinal cord, medulla, thalamus and in the periventricular, juxtacortical and deep white matter of the hemispheres.  At least a dozen foci in the hemispheres enhance after contrast consistent with acute demyelination.  These and some other foci are new compared to the 05/19/2020 MRI.  MRI of the cervical spine 07/15/2020 shows multiple T2 hyperintense foci in the spinal cord.  None of the foci enhance after contrast.  Some foci are new compared to the 12/11/2019 MRI.  MRI of the thoracic spine 07/15/2020 showed multiple T2 hyperintense foci in the thoracic spinal cord.  1 focus at T4 appears to be enhancing.  There are several new lesions compared to  the 12/16/2019 MRI.  MRI of the brain 05/19/2020 showed multiple T2/FLAIR hyperintense foci in the periventricular, juxtacortical and deep white matter.  There are also foci in the cerebellum, left middle cerebellar peduncle and brainstem.  Multiple lesions enhance including some in the hemispheres, cerebellum and middle cerebellar peduncle.  Foci that enhanced on the March 2021 MRI appear smaller and do not enhance  MRI of the brain  12/16/2019 shows Multiple T2/FLAIR hyperintense foci in the periventricular, juxtacortical and deep white matter of the hemispheres.  Additionally, foci are noted in the brainstem and cerebellum enhancement.  MRI of the brain 12/11/2019  shows multiple T2/FLAIR hyperintense foci in the periventricular, juxtacortical and deep white matter of the hemispheres.  Many of the periventricular foci are radially oriented to the ventricles.  Foci are noted in the deep gray matter, brainstem and cerebellar peduncles multiple lesions enhance.  MRI of the cervical spine 12/11/2019 shows extensive T2 hyperintense foci in the spinal cord the most prominent focus is posteriorly to the right at C2.  None of the foci enhanced.  MRI of the thoracic spine 12/11/2019 showed multiple T2 hyperintense foci within the spinal cord.  There was enhancement at T1-T2 and T10-T11.  MRI of the lumbar spine 12/11/2019 showed some demyelination in the distal spinal cord but no enhancement.  There is a small disc protrusion at L5-S1 that does not cause nerve root compression or spinal stenosis.  Laboratory tests: 06/19/2020: Hep B surface antibody positive.  Hep B surface antigen and hep C core antibody are negative.   QuantiFERON-TB is negative VZV IgG shows that he is not immune to varicella.  Labs done March 2021 admission: RPR negative, HIV negative, ACE normal.  NMO antibody was negative.  SARS-CoV-2 PCR was negative.  Cerebrospinal fluid December 12, 2019: IgG index 1.1 (this is very elevated consistent with MS, oligoclonal bands was not performed).  CSF was otherwise normal. .    REVIEW OF SYSTEMS: Constitutional: No fevers, chills, sweats, or change in appetite.  He notes fatigue Eyes: No visual changes, double vision, eye pain Ear, nose and throat: No hearing loss, ear pain, nasal congestion, sore throat Cardiovascular: No chest pain, palpitations Respiratory:  No shortness of breath at rest or with exertion.   No wheezes GastrointestinaI: No nausea, vomiting, diarrhea, abdominal pain, fecal incontinence Genitourinary:  No dysuria, urinary retention or frequency.  No nocturia. Musculoskeletal:  No neck pain, back pain Integumentary: No rash, pruritus, skin  lesions Neurological: as above Psychiatric: As above Endocrine: No palpitations, diaphoresis, change in appetite, change in weigh or increased thirst Hematologic/Lymphatic:  No anemia, purpura, petechiae. Allergic/Immunologic: No itchy/runny eyes, nasal congestion, recent allergic reactions, rashes  ALLERGIES: No Known Allergies  HOME MEDICATIONS:  Current Outpatient Medications:    acetaminophen (TYLENOL) 325 MG tablet, Take 2 tablets (650 mg total) by mouth every 4 (four) hours as needed for mild pain or fever (or Fever >/= 101)., Disp: , Rfl:    Baclofen 5 MG TABS, Take 5 mg by mouth 3 (three) times daily., Disp: 90 tablet, Rfl: 11   docusate sodium (COLACE) 100 MG capsule, Take 1 capsule (100 mg total) by mouth 3 (three) times daily., Disp: 10 capsule, Rfl: 0   DULoxetine (CYMBALTA) 60 MG capsule, Take 1 capsule (60 mg total) by mouth daily., Disp: 90 capsule, Rfl: 3   melatonin 3 MG TABS tablet, Take 2 tablets (6 mg total) by mouth at bedtime., Disp: 30 tablet, Rfl: 0   midodrine (PROAMATINE) 10 MG tablet,  Take 1 tablet (10 mg total) by mouth 2 (two) times daily with a meal., Disp: 60 tablet, Rfl: 11   pantoprazole (PROTONIX) 40 MG tablet, Take 1 tablet (40 mg total) by mouth daily., Disp: 30 tablet, Rfl: 0   sertraline (ZOLOFT) 50 MG tablet, Take 1 tablet (50 mg total) by mouth at bedtime., Disp: 90 tablet, Rfl: 4  PAST MEDICAL HISTORY: Past Medical History:  Diagnosis Date   Autism    Depression    MS (multiple sclerosis) (HCC)     PAST SURGICAL HISTORY: Past Surgical History:  Procedure Laterality Date   NO PAST SURGERIES      FAMILY HISTORY: Family History  Problem Relation Age of Onset   Clotting disorder Mother     SOCIAL HISTORY:  Social History   Socioeconomic History   Marital status: Single    Spouse name: Not on file   Number of children: Not on file   Years of education: Not on file   Highest education level: Not on file  Occupational History    Not on file  Tobacco Use   Smoking status: Never   Smokeless tobacco: Never  Vaping Use   Vaping Use: Never used  Substance and Sexual Activity   Alcohol use: No   Drug use: Never   Sexual activity: Not Currently  Other Topics Concern   Not on file  Social History Narrative   Right handed    Caffeine use: soda sometimes   Lives with family   Social Determinants of Health   Financial Resource Strain: Not on file  Food Insecurity: Not on file  Transportation Needs: Not on file  Physical Activity: Not on file  Stress: Not on file  Social Connections: Not on file  Intimate Partner Violence: Not on file     PHYSICAL EXAM  Vitals:   03/25/21 1602  BP: 106/67  Pulse: 94  Weight: 122 lb 8 oz (55.6 kg)  Height: 5\' 10"  (1.778 m)    Body mass index is 17.58 kg/m.   General: The patient is well-developed and well-nourished and in no acute distress  HEENT:  Head is [place]/AT.  Sclera are anicteric.     Skin: Extremities are without rash or  edema.  Musculoskeletal:  Back is nontender  Neurologic Exam  Mental status: The patient is alert and oriented x 3 at the time of the examination. The patient has apparent normal recent and remote memory, with an apparently normal attention span and concentration ability.   Speech is normal.  Cranial nerves: Extraocular movements are full. He has a right APD. Reduced color vision OD.  Facial strength and sensation is normal.  No obvious hearing deficits are noted.  Motor:  Muscle bulk is normal.   Tone is normal. Strength was 5/5 in the arms and left leg, 4+/5 in the iliopsoas and ankle/toe extensors and 5/5 elsewhere in the right leg.  Sensory: Sensory testing is intact to touch and vibration  Coordination: Cerebellar testing reveals reduced finger-nose-finger and reduced heel-to-shin bilaterally.  Gait and station: Station is normal.   Gait is wide and ataxic.  He requires unilateral support to take more than a few steps due to poor  balance.Marland Kitchen  He is using a walker most of the time.  He cannot do a tandem walk. Tandem gait is normal. Romberg is positive.   Reflexes: Deep tendon reflexes are symmetric 3 in the arms and 3+ at the knees where he has crossed abductor reflexes.  He has  sustained clonus at the ankles.    DIAGNOSTIC DATA (LABS, IMAGING, TESTING) - I reviewed patient records, labs, notes, testing and imaging myself where available.  Lab Results  Component Value Date   WBC 10.8 01/22/2021   HGB 15.2 01/22/2021   HCT 45.7 01/22/2021   MCV 90 01/22/2021   PLT 245 01/22/2021      Component Value Date/Time   NA 137 07/23/2020 0612   K 4.0 07/23/2020 0612   CL 103 07/23/2020 0612   CO2 25 07/23/2020 0612   GLUCOSE 89 07/23/2020 0612   BUN 15 07/23/2020 0612   CREATININE 0.98 07/26/2020 0420   CALCIUM 8.9 07/23/2020 0612   PROT 6.1 (L) 07/21/2020 0610   ALBUMIN 3.4 (L) 07/21/2020 0610   ALBUMIN 4.6 12/12/2019 0002   AST 15 07/21/2020 0610   ALT 16 07/21/2020 0610   ALKPHOS 40 07/21/2020 0610   BILITOT 0.5 07/21/2020 0610   GFRNONAA >60 07/26/2020 0420   GFRAA >60 06/02/2020 0516   No results found for: CHOL, HDL, LDLCALC, LDLDIRECT, TRIG, CHOLHDL Lab Results  Component Value Date   HGBA1C 5.3 12/14/2019   Lab Results  Component Value Date   VITAMINB12 678 12/11/2019   No results found for: TSH     ASSESSMENT AND PLAN  Multiple sclerosis (HCC) - Plan: MR BRAIN W WO CONTRAST  High risk medication use  Poor balance  Autism spectrum disorder  POTS (postural orthostatic tachycardia syndrome)   1.  Continue Tysabri.  We will check MRI brain with and without contrast to determine if there is any breakthrough activity. 2.  change zoloft to cymbalta.   3..  rtc 6 months sooner if problems     Nichoel Digiulio A. Epimenio Foot, MD, [facility] 03/25/2021, 8:06 PM Certified in Neurology, Clinical Neurophysiology, Sleep Medicine and Neuroimaging  [facility] [address] [place], [place] [address] [phone]

## 2021-03-27 ENCOUNTER — Other Ambulatory Visit: Payer: Self-pay

## 2021-03-27 ENCOUNTER — Non-Acute Institutional Stay (HOSPITAL_COMMUNITY)
Admission: RE | Admit: 2021-03-27 | Discharge: 2021-03-27 | Disposition: A | Discharge status: Home / Self Care | Payer: Medicaid Other | Source: Ambulatory Visit | Attending: Internal Medicine | Admitting: Internal Medicine

## 2021-03-27 DIAGNOSIS — G35 Multiple sclerosis: Secondary | ICD-10-CM | POA: Insufficient documentation

## 2021-03-27 IMAGING — DX DG CHEST 1V PORT
1 series · 2 of 2 positions shown · non-contrast
Comparison: None.

CLINICAL DATA: Increasing weakness

EXAM:
PORTABLE CHEST 1 VIEW

[Series 1: chest ap · 0.14mm/px · 2 of 2 slices shown]
[im 1/2]
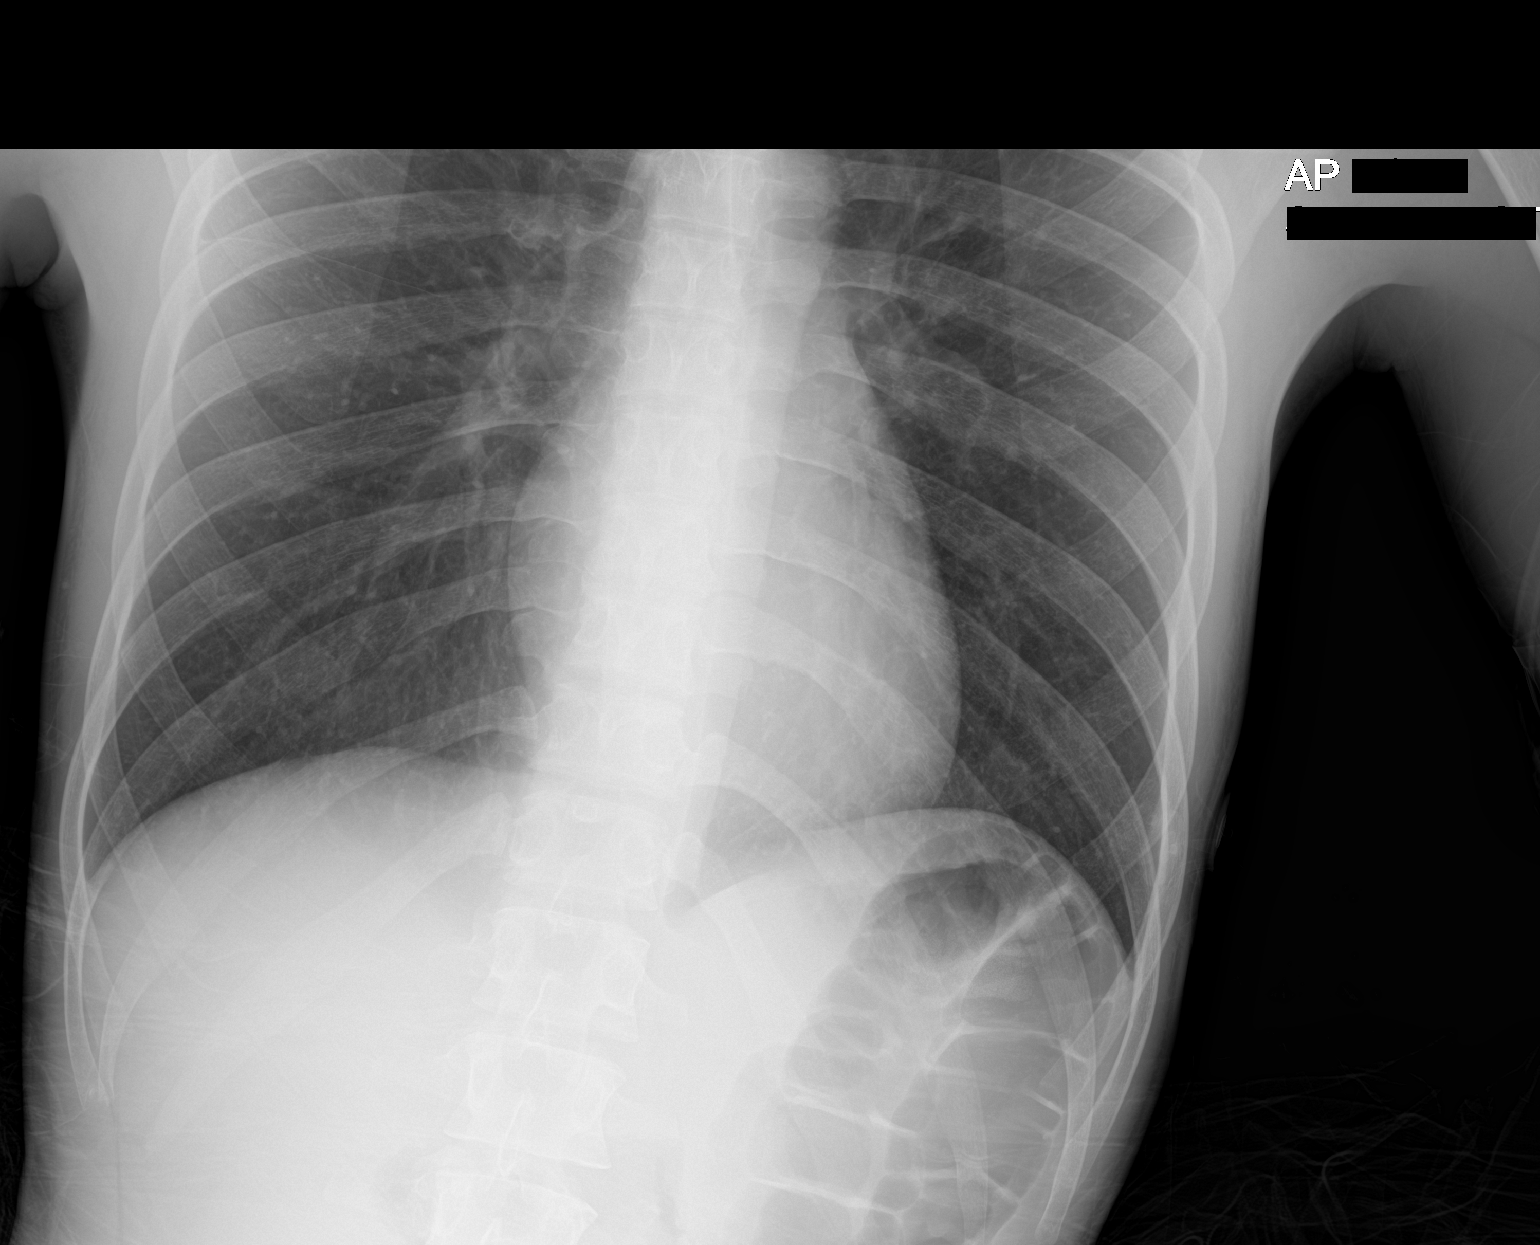
[im 2/2]
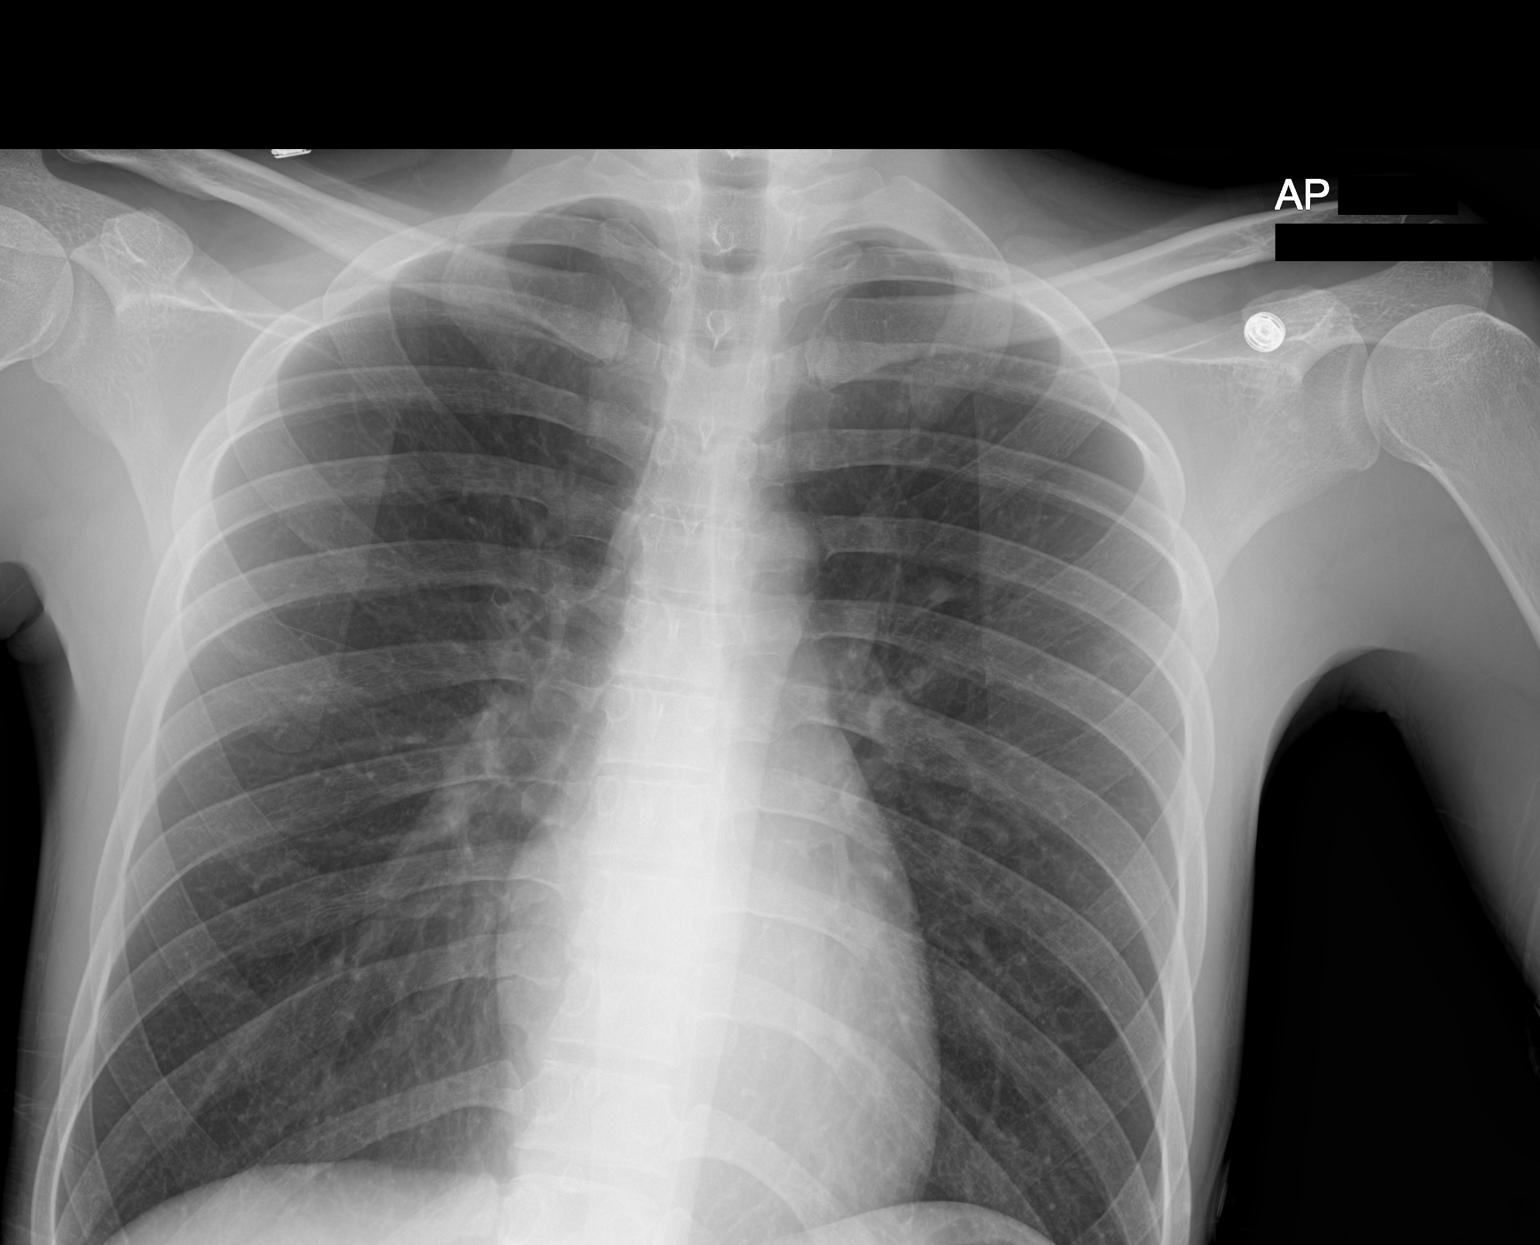

[2 of 2 positions shown; findings below may reference images not displayed]

FINDINGS: The heart size and mediastinal contours are within normal limits.
Both lungs are clear. The visualized skeletal structures are
unremarkable.
IMPRESSION: No active disease.

## 2021-03-27 MED ORDER — SODIUM CHLORIDE 0.9 % IV SOLN
300.0000 mg | INTRAVENOUS | Status: DC
  Administered 2021-03-27: 300 mg via INTRAVENOUS
  Filled 2021-03-27: qty 15

## 2021-03-27 MED ORDER — SODIUM CHLORIDE 0.9 % IV SOLN
INTRAVENOUS | Status: DC | PRN
  Administered 2021-03-27: 250 mL via INTRAVENOUS

## 2021-03-27 NOTE — Progress Notes (Signed)
PATIENT CARE CENTER NOTE   Diagnosis: Multiple Sclerosis G35     Provider: Despina Arias, MD     Procedure: Tysabri infusion      Note: Patient received Tysabri infusion via PIV. Tolerated well with no adverse reaction. Observed patient for 1 hour post-infusion. Vital signs stable. Discharge instructions given. Patient alert, oriented and ambulatory to wheelchair at discharge. Discharged home with mother.

## 2021-04-03 ENCOUNTER — Other Ambulatory Visit: Payer: Self-pay

## 2021-04-03 ENCOUNTER — Encounter: Payer: Self-pay | Admitting: Physical Medicine and Rehabilitation

## 2021-04-03 ENCOUNTER — Encounter
Payer: Medicaid Other | Attending: Physical Medicine and Rehabilitation | Admitting: Physical Medicine and Rehabilitation

## 2021-04-03 VITALS — BP 113/75 | HR 95 | Temp 99.0°F | Ht 69.0 in | Wt 123.0 lb

## 2021-04-03 DIAGNOSIS — I498 Other specified cardiac arrhythmias: Secondary | ICD-10-CM | POA: Insufficient documentation

## 2021-04-03 DIAGNOSIS — R2689 Other abnormalities of gait and mobility: Secondary | ICD-10-CM | POA: Diagnosis present

## 2021-04-03 DIAGNOSIS — G90A Postural orthostatic tachycardia syndrome (POTS): Secondary | ICD-10-CM

## 2021-04-03 DIAGNOSIS — G35 Multiple sclerosis: Secondary | ICD-10-CM | POA: Insufficient documentation

## 2021-04-03 DIAGNOSIS — R252 Cramp and spasm: Secondary | ICD-10-CM | POA: Insufficient documentation

## 2021-04-03 DIAGNOSIS — F329 Major depressive disorder, single episode, unspecified: Secondary | ICD-10-CM | POA: Insufficient documentation

## 2021-04-03 DIAGNOSIS — I951 Orthostatic hypotension: Secondary | ICD-10-CM | POA: Diagnosis not present

## 2021-04-03 MED ORDER — DULOXETINE HCL 30 MG PO CPEP: 30.0000 mg | ORAL_CAPSULE | Freq: Every day | ORAL | 0 refills | Status: DC

## 2021-04-03 MED ORDER — ONDANSETRON HCL 4 MG PO TABS: 4.0000 mg | ORAL_TABLET | Freq: Three times a day (TID) | ORAL | 0 refills | Status: AC | PRN

## 2021-04-03 NOTE — Patient Instructions (Signed)
Patient is a 25 yr old autistic male with fast relapsing-remitting MS- with 4 exacerbations now in <1 yr- with autonomic dysfunction.  With depression and constipation  Has Rx that hasn't picked up for Cymbalta /Duloxetine 60 mg daily- however that can cause 15% of pt's have nausea.  Will give Cymbalta 30 mg daily x 7 days to take BEFORE 60 mg to get used to  the medicine.  Zofran/ondansetron  to take 4 mg q8 hours as needed for nausea- since can have nausea x 7 days with Duloxetine.  Con't Baclofen 5 mg TID and Midodrine 10 mg in AM and lunch- has 6 more refills left.  Mild side effects- of cymbalta- can be mild constipation, dry eyes and dry mouth- keep an eye on bowels. Grapes and kiwis can help constipation and cherries, citrus, melon. Not apples and bananas Call me if Cymbalta doesn't work.  F/U in 3-4 months- double appointment- MS

## 2021-04-03 NOTE — Progress Notes (Signed)
Subjective:    Patient ID: Jaime [name], male    DOB: 16-Jan-1996, 25 y.o.   MRN: [id number]  HPI Patient is a 25 yr old autistic male with fast relapsing-remitting MS- with 4 exacerbations now in <1 yr- with autonomic dysfunction.  With depression and constipation    Here for f/u on MS.  Has appointment for MRI's on 7/5- for brain, cervical spine. TO see if new medicine/Tysabri is working.   Has a fall this yesterday- was walking hallway and  "saved self"- felt off balance- saved self somewhat on wall. Mother heard tumble- but did end up on the floor.  Also had bladder accident- last week.   Still has constipation.   Had dog since puppy- had cancer and was 67 yrs old.  And just had to put her down.   Not really taking Sertraline at night- was given Cymbalta during day- haven't picked up yet.   Has spasticity controlled- per Pt.  Taking Baclofen  Takes Midodrine 10 mg 2x/day-  Going to [employer] in October 2022.  Mother to get married to [place]- her fiance in the next 1-2 years.    Pain Inventory Average Pain 0 Pain Right Now 0 My pain is  no pain  LOCATION OF PAIN  no pain  BOWEL Number of stools per week: . Oral laxative use No  Type of laxative . Enema or suppository use No  History of colostomy No  Incontinent No   BLADDER Normal In and out cath, frequency . Able to self cath  . Bladder incontinence No  Frequent urination No  Leakage with coughing No  Difficulty starting stream No  Incomplete bladder emptying No    Mobility walk with assistance use a walker use a wheelchair  Function disabled: date disabled .  Neuro/Psych trouble walking  Prior Studies Any changes since last visit?  no  Physicians involved in your care Any changes since last visit?  no   Family History  Problem Relation Age of Onset   Clotting disorder Mother    Social History   Socioeconomic History   Marital status: Single    Spouse name: Not on file    Number of children: Not on file   Years of education: Not on file   Highest education level: Not on file  Occupational History   Not on file  Tobacco Use   Smoking status: Never   Smokeless tobacco: Never  Vaping Use   Vaping Use: Never used  Substance and Sexual Activity   Alcohol use: No   Drug use: Never   Sexual activity: Not Currently  Other Topics Concern   Not on file  Social History Narrative   Right handed    Caffeine use: soda sometimes   Lives with family   Social Determinants of Health   Financial Resource Strain: Not on file  Food Insecurity: Not on file  Transportation Needs: Not on file  Physical Activity: Not on file  Stress: Not on file  Social Connections: Not on file   Past Surgical History:  Procedure Laterality Date   NO PAST SURGERIES     Past Medical History:  Diagnosis Date   Autism    Depression    MS (multiple sclerosis) (HCC)    BP 113/75   Pulse 95   Temp 99 F (37.2 C)   Ht 5\' 9"  (1.753 m)   Wt 123 lb (55.8 kg)   SpO2 96%   BMI 18.16 kg/m  Opioid Risk Score:   Fall Risk Score:  `1  Depression screen PHQ 2/9  Depression screen [facility] 2/9 11/17/2020 01/09/2020  Decreased Interest 1 0  Down, Depressed, Hopeless 1 0  PHQ - 2 Score 2 0  Altered sleeping - 0  Tired, decreased energy - 0  Change in appetite - 0  Feeling bad or failure about yourself  - 0  Trouble concentrating - 0  Moving slowly or fidgety/restless - 0  Suicidal thoughts - 0  PHQ-9 Score - 0    Review of Systems  Constitutional: Negative.   HENT: Negative.    Eyes: Negative.   Respiratory: Negative.    Cardiovascular: Negative.   Gastrointestinal: Negative.   Genitourinary: Negative.   Musculoskeletal:  Positive for gait problem.  Skin: Negative.   Allergic/Immunologic: Negative.   Hematological: Negative.   Psychiatric/Behavioral: Negative.    All other systems reviewed and are negative.     Objective:   Physical Exam Awake, alert, appropriate but  very sad/depressed, tearful over death of dog, NAD In transport w/c Accompanied by mother and soon to be stepfather Clonus in ankles B/L 4-5 beats Poor balance-  Fair sitting balance not great- can fall over to side with tactile sitmuli       Assessment & Plan:   Patient is a 25 yr old autistic male with fast relapsing-remitting MS- with 4 exacerbations now in <1 yr- with autonomic dysfunction.  With depression and constipation  Has Rx that hasn't picked up for Cymbalta /Duloxetine 60 mg daily- however that can cause 15% of pt's have nausea.  Will give Cymbalta 30 mg daily x 7 days to take BEFORE 60 mg to get used to  the medicine.  Zofran/ondansetron  to take 4 mg q8 hours as needed for nausea- since can have nausea x 7 days with Duloxetine.  Con't Baclofen 5 mg TID and Midodrine 10 mg in AM and lunch- has 6 more refills left.  Mild side effects- of cymbalta- can be mild constipation, dry eyes and dry mouth- keep an eye on bowels. Grapes and kiwis can help constipation and cherries, citrus, melon. Not apples and bananas Call me if Cymbalta doesn't work.  F/U in 3-4 months- double appointment- MS 8. Discussed getting new abdominal binder- doesn't ned corset.

## 2021-04-07 ENCOUNTER — Ambulatory Visit
Admission: RE | Admit: 2021-04-07 | Discharge: 2021-04-07 | Disposition: A | Discharge status: Home / Self Care | Payer: Medicaid Other | Source: Ambulatory Visit | Attending: Neurology | Admitting: Neurology

## 2021-04-07 DIAGNOSIS — G35 Multiple sclerosis: Secondary | ICD-10-CM | POA: Diagnosis not present

## 2021-04-07 MED ORDER — GADOBENATE DIMEGLUMINE 529 MG/ML IV SOLN
12.0000 mL | Freq: Once | INTRAVENOUS | Status: AC | PRN
  Administered 2021-04-07: 12 mL via INTRAVENOUS

## 2021-04-08 ENCOUNTER — Telehealth: Payer: Self-pay | Admitting: *Deleted

## 2021-04-08 NOTE — Telephone Encounter (Signed)
-----   Message from Asa Lente, MD sent at 04/07/2021  6:07 PM EDT ----- Please let him or his family know that the MRI of the brain showed old MS lesions but did not show any new MS lesions

## 2021-04-08 NOTE — Telephone Encounter (Signed)
Tried calling mother, [name]. VM not set up, unable to LVM

## 2021-04-09 ENCOUNTER — Encounter: Payer: Self-pay | Admitting: *Deleted

## 2021-04-09 NOTE — Telephone Encounter (Signed)
Tried calling mother. Went to VM,VM full. Will send letter re results.

## 2021-04-29 ENCOUNTER — Telehealth (HOSPITAL_COMMUNITY): Payer: Self-pay

## 2021-04-29 NOTE — Telephone Encounter (Signed)
Spoke with Fayrene Fearing at [facility], inquiring about previous Tysabri infusion. Provided last infusion date for pt.

## 2021-06-09 ENCOUNTER — Other Ambulatory Visit: Payer: Self-pay | Admitting: Neurology

## 2021-06-09 DIAGNOSIS — G35 Multiple sclerosis: Secondary | ICD-10-CM

## 2021-06-17 ENCOUNTER — Other Ambulatory Visit: Payer: Self-pay

## 2021-06-17 ENCOUNTER — Non-Acute Institutional Stay (HOSPITAL_COMMUNITY)
Admission: RE | Admit: 2021-06-17 | Discharge: 2021-06-17 | Disposition: A | Discharge status: Home / Self Care | Payer: Medicaid Other | Source: Ambulatory Visit | Attending: Internal Medicine | Admitting: Internal Medicine

## 2021-06-17 DIAGNOSIS — G35 Multiple sclerosis: Secondary | ICD-10-CM | POA: Insufficient documentation

## 2021-06-17 MED ORDER — NATALIZUMAB 300 MG/15ML IV CONC
300.0000 mg | INTRAVENOUS | Status: DC
Start: 2021-06-17 — End: 2021-06-18
  Administered 2021-06-17: 300 mg via INTRAVENOUS
  Filled 2021-06-17: qty 15

## 2021-06-17 MED ORDER — SODIUM CHLORIDE 0.9 % IV SOLN
INTRAVENOUS | Status: DC | PRN
  Administered 2021-06-17: 250 mL via INTRAVENOUS

## 2021-06-17 NOTE — Progress Notes (Addendum)
    Diagnosis: Multiple Sclerosis       Provider: Despina Arias, MD     Procedure: Donnamarie Poag 300mg      Note: Patient received Tysabri infusion ( dose #1 of 11)  via PIV. No premeds required. Tolerated infusion well with no adverse reaction. Vital signs stable. Discharge instructions given. Patient and mother stayed for 30 minute post-infusion observation. Pt instructed to schedule monthly infusion prior to leaving, verbalized understanding. Alert, oriented and ambulatory with rolling walker at discharge, accompanied by mother.

## 2021-07-06 ENCOUNTER — Encounter: Payer: Medicaid Other | Admitting: Physical Medicine and Rehabilitation

## 2021-07-10 ENCOUNTER — Encounter: Payer: Medicaid Other | Admitting: Registered Nurse

## 2021-07-10 ENCOUNTER — Other Ambulatory Visit: Payer: Self-pay

## 2021-07-10 ENCOUNTER — Encounter: Payer: Medicaid Other | Admitting: Physical Medicine and Rehabilitation

## 2021-07-17 ENCOUNTER — Encounter (HOSPITAL_COMMUNITY): Payer: Medicaid Other

## 2021-07-29 ENCOUNTER — Encounter (HOSPITAL_COMMUNITY): Payer: Medicaid Other

## 2021-08-31 ENCOUNTER — Encounter
Payer: Medicaid Other | Attending: Physical Medicine and Rehabilitation | Admitting: Physical Medicine and Rehabilitation

## 2021-08-31 ENCOUNTER — Other Ambulatory Visit: Payer: Self-pay

## 2021-08-31 ENCOUNTER — Encounter: Payer: Self-pay | Admitting: Physical Medicine and Rehabilitation

## 2021-08-31 VITALS — BP 106/72 | HR 94 | Temp 98.2°F | Ht 69.0 in | Wt 127.0 lb

## 2021-08-31 DIAGNOSIS — K5901 Slow transit constipation: Secondary | ICD-10-CM | POA: Insufficient documentation

## 2021-08-31 DIAGNOSIS — I951 Orthostatic hypotension: Secondary | ICD-10-CM | POA: Diagnosis not present

## 2021-08-31 DIAGNOSIS — R2689 Other abnormalities of gait and mobility: Secondary | ICD-10-CM | POA: Insufficient documentation

## 2021-08-31 DIAGNOSIS — R252 Cramp and spasm: Secondary | ICD-10-CM | POA: Diagnosis not present

## 2021-08-31 DIAGNOSIS — G35 Multiple sclerosis: Secondary | ICD-10-CM | POA: Insufficient documentation

## 2021-08-31 DIAGNOSIS — F329 Major depressive disorder, single episode, unspecified: Secondary | ICD-10-CM | POA: Diagnosis present

## 2021-08-31 MED ORDER — BACLOFEN 5 MG PO TABS: 5.0000 mg | ORAL_TABLET | Freq: Three times a day (TID) | ORAL | 11 refills | Status: AC

## 2021-08-31 MED ORDER — DULOXETINE HCL 60 MG PO CPEP: 60.0000 mg | ORAL_CAPSULE | Freq: Every day | ORAL | 5 refills | Status: DC

## 2021-08-31 MED ORDER — MIDODRINE HCL 10 MG PO TABS: 10.0000 mg | ORAL_TABLET | Freq: Two times a day (BID) | ORAL | 11 refills | Status: AC

## 2021-08-31 NOTE — Progress Notes (Signed)
Subjective:    Patient ID: Jaime [name], male    DOB: 10-03-1996, 25 y.o.   MRN: [id number]  HPI  Patient is a 25 yr old autistic male with fast relapsing-remitting MS- with 4 exacerbations now in <1 yr- with autonomic dysfunction.  With depression and constipation  Here for f/u on function related to MS.    On Cymbalta 60 mg daily-  Mood is pretty stable. Getting way better on Duloxetine.  Still has pain- when has to get shots only.   Constipation- still a little constipated-    Going every other day- is hard- not round little pebbles.  Just not going as often as would like.   Went to [employer] and [employer].    Walking- pretty stable- no falls-  No walker at home- house is small enough and does furniture walking. Doing stairs- again- doing well.    BP 106/72- got a new abd binder for low BP- does wear it usually.  BP still on low side.   Getting infusion this week.  For MS.  Ocrevis this week.        Pain Inventory Average Pain 0 Pain Right Now 0  BOWEL Number of stools per week: 3 Oral laxative use Yes  Type of laxative colace  BLADDER Normal  Mobility walk with assistance use a wheelchair  Function not employed: date last employed .  Neuro/Psych bowel control problems trouble walking dizziness  Prior Studies Any changes since last visit?  no  Physicians involved in your care Any changes since last visit?  no   Family History  Problem Relation Age of Onset   Clotting disorder Mother    Social History   Socioeconomic History   Marital status: Single    Spouse name: Not on file   Number of children: Not on file   Years of education: Not on file   Highest education level: Not on file  Occupational History   Not on file  Tobacco Use   Smoking status: Never   Smokeless tobacco: Never  Vaping Use   Vaping Use: Never used  Substance and Sexual Activity   Alcohol use: No   Drug use: Never   Sexual activity: Not Currently  Other  Topics Concern   Not on file  Social History Narrative   Right handed    Caffeine use: soda sometimes   Lives with family   Social Determinants of Health   Financial Resource Strain: Not on file  Food Insecurity: Not on file  Transportation Needs: Not on file  Physical Activity: Not on file  Stress: Not on file  Social Connections: Not on file   Past Surgical History:  Procedure Laterality Date   NO PAST SURGERIES     Past Medical History:  Diagnosis Date   Autism    Depression    MS (multiple sclerosis) (HCC)    BP 106/72   Pulse 94   Temp 98.2 F (36.8 C)   Ht 5\' 9"  (1.753 m)   Wt 127 lb (57.6 kg)   SpO2 97%   BMI 18.75 kg/m   Opioid Risk Score:   Fall Risk Score:  `1  Depression screen PHQ 2/9  Depression screen [facility] 2/9 08/31/2021 11/17/2020 01/09/2020  Decreased Interest 0 1 0  Down, Depressed, Hopeless 0 1 0  PHQ - 2 Score 0 2 0  Altered sleeping - - 0  Tired, decreased energy - - 0  Change in appetite - - 0  Feeling bad or failure about yourself  - - 0  Trouble concentrating - - 0  Moving slowly or fidgety/restless - - 0  Suicidal thoughts - - 0  PHQ-9 Score - - 0     Review of Systems  Constitutional: Negative.   HENT: Negative.    Eyes: Negative.   Respiratory: Negative.    Cardiovascular: Negative.   Gastrointestinal:  Positive for constipation.  Endocrine: Negative.   Genitourinary: Negative.   Musculoskeletal:  Positive for gait problem.  Skin: Negative.   Allergic/Immunologic: Negative.   Neurological:  Positive for dizziness.  Hematological: Negative.   Psychiatric/Behavioral: Negative.    All other systems reviewed and are negative.     Objective:   Physical Exam  Awake, alert, decreased eye contact- chronic for [place]; wearing eyeglasses, NAD MS: RUE- 4+/5 in biceps, triceps, WE, grip and finger abd 4-5 LUE- same as RUE RLE- HF 4+/5, KE 4/5, DF 4-/5 and PF 4+/5 LLE- HF 4+/5; KE 4+/5 DF 4/5 and PF 4/5  Neuro: No hoffman's  B/L  Clonus in LE's B/L 4-5 beats MAS of 1 in LE's but 0 in Ue's B/L  BP 106/72     Assessment & Plan:    Patient is a 25 yr old autistic male with fast relapsing-remitting MS- with 4 exacerbations now in <1 yr- with autonomic dysfunction.  With depression and constipation and orthostatic hypotension/  Here for f/u on function related to MS.    Constipation- Suggest Senokot 1-2 tabs daily- would not get Senokot-S- would make his stools pasty/too soft. Can do gummies- Senokot comes in Gummies.  Start with 1/day and do for 5-7 days- if not enough, increase to 2 gummies/day.    2. Will refill Midodrine 10 mg 3x/day- 11 refills- for orthostatic hypotension/autonomic dysfunction due to MS.    3. Con't Baclofen 5 mg 3x/day- for spasticity- 90 tabs- 11 refills   4. Con't Cymbalta/Duloxetine- since is helping his mood - 60 mg daily- #30- 5 refills.    5. Con't Ocrevis per Neurology- and titrated as per Neuro.    6. F/U in 4 months- for MS  I spent a total of 21 minutes on visit- going over depression and med options- decided to stay the same for now.

## 2021-08-31 NOTE — Patient Instructions (Signed)
Patient is a 25 yr old autistic male with fast relapsing-remitting MS- with 4 exacerbations now in <1 yr- with autonomic dysfunction.  With depression and constipation and orthostatic hypotension/  Here for f/u on function related to MS.    Constipation- Suggest Senokot 1-2 tabs daily- would not get Senokot-S- would make his stools pasty/too soft. Can do gummies- Senokot comes in Gummies.  Start with 1/day and do for 5-7 days- if not enough, increase to 2 gummies/day.    2. Will refill Midodrine 10 mg 3x/day- 11 refills- for orthostatic hypotension/autonomic dysfunction due to MS.    3. Con't Baclofen 5 mg 3x/day- for spasticity- 90 tabs- 11 refills   4. Con't Cymbalta/Duloxetine- since is helping his mood - 60 mg daily- #30- 5 refills.    5. Con't Ocrevis per Neurology- and titrated as per Neuro.    6. F/U in 4 months- for MS

## 2021-09-03 ENCOUNTER — Telehealth: Payer: Self-pay | Admitting: Neurology

## 2021-09-03 NOTE — Telephone Encounter (Signed)
I LVM for patient that his appointment was rescheduled from 12/22 to 12/29 at 3:30pm because Dr. Epimenio Foot will be out of the office the previous week and to call us back if this new date/time does not work for him.

## 2021-09-24 ENCOUNTER — Ambulatory Visit: Payer: Medicaid Other | Admitting: Neurology

## 2021-10-01 ENCOUNTER — Ambulatory Visit: Payer: Medicaid Other | Admitting: Neurology

## 2021-10-12 ENCOUNTER — Telehealth (HOSPITAL_COMMUNITY): Payer: Self-pay

## 2021-10-12 NOTE — Telephone Encounter (Signed)
Communicated with Marlana Latus RN, via Epic secure chat, confirming if patient can receive Tysabri infusion scheduled on 10/15/21. Explained that pt was last infused in September. Informed by Baird Lyons RN that pt missed appointment with neurologist on 12/29.  Per Dr. Epimenio Foot, pt can receive this dose of Tysabri  however prior to next dose pt must schedule appointment at the neurologist office for labs and evaluation. Will review this with patient and mother at upcoming visit on 1/12.

## 2021-10-13 ENCOUNTER — Telehealth: Payer: Self-pay | Admitting: Neurology

## 2021-10-13 NOTE — Telephone Encounter (Signed)
Called the mom and talked with her about getting the patient in for a upcoming apt. I scheduled him with the NP on 10/22/21 at 8 am. The mom would prefer the pt have a afternoon and asked if something 11 am or after opens up to let them know.

## 2021-10-15 ENCOUNTER — Other Ambulatory Visit: Payer: Self-pay

## 2021-10-15 ENCOUNTER — Non-Acute Institutional Stay (HOSPITAL_COMMUNITY)
Admission: RE | Admit: 2021-10-15 | Discharge: 2021-10-15 | Disposition: A | Discharge status: Home / Self Care | Payer: Medicaid Other | Source: Ambulatory Visit | Attending: Internal Medicine | Admitting: Internal Medicine

## 2021-10-15 DIAGNOSIS — G35 Multiple sclerosis: Secondary | ICD-10-CM | POA: Insufficient documentation

## 2021-10-15 MED ORDER — SODIUM CHLORIDE 0.9 % IV SOLN: INTRAVENOUS | Status: DC | PRN

## 2021-10-15 MED ORDER — SODIUM CHLORIDE 0.9 % IV SOLN
300.0000 mg | INTRAVENOUS | Status: DC
  Administered 2021-10-15: 300 mg via INTRAVENOUS
  Filled 2021-10-15: qty 15

## 2021-10-15 NOTE — Progress Notes (Signed)
Patient Care Center Note    Diagnosis: Multiple Sclerosis       Provider: Despina Arias, MD     Procedure: Donnamarie Poag 300mg      Note: Patient arrived for Tysabri infusion. Prior to infusion, patient and patient's mother reported that patient had been having increased weakness in his right leg x 1 week. Notified Dr. RN Bonnita Hollow) via secure chat. Per Marlana Latus it is alright to proceed with Tysabri infusion. Patient also reminded to keep up-coming follow-up appointment with Dr. Baird Lyons. Patient received Tysabri infusion ( dose # 2 of 11)  via PIV. No premeds required.Tolerated infusion well with no adverse reaction. Vital signs stable. Discharge instructions given. Pt instructed to schedule monthly infusion prior to leaving, verbalized understanding. Alert, oriented and ambulatory to wheelchair at discharge, accompanied by mother.

## 2021-10-18 ENCOUNTER — Inpatient Hospital Stay (HOSPITAL_COMMUNITY)
Admission: EM | Admit: 2021-10-18 | Discharge: 2021-10-24 | DRG: 059 | Disposition: A | Discharge status: Rehabilitation Facility | Payer: Medicaid Other | Attending: Internal Medicine | Admitting: Internal Medicine

## 2021-10-18 ENCOUNTER — Emergency Department (HOSPITAL_COMMUNITY): Payer: Medicaid Other

## 2021-10-18 ENCOUNTER — Encounter (HOSPITAL_COMMUNITY): Payer: Self-pay

## 2021-10-18 ENCOUNTER — Other Ambulatory Visit: Payer: Self-pay

## 2021-10-18 DIAGNOSIS — F32A Depression, unspecified: Secondary | ICD-10-CM | POA: Diagnosis present

## 2021-10-18 DIAGNOSIS — R4781 Slurred speech: Secondary | ICD-10-CM

## 2021-10-18 DIAGNOSIS — R270 Ataxia, unspecified: Secondary | ICD-10-CM | POA: Diagnosis present

## 2021-10-18 DIAGNOSIS — I959 Hypotension, unspecified: Secondary | ICD-10-CM | POA: Diagnosis present

## 2021-10-18 DIAGNOSIS — R531 Weakness: Secondary | ICD-10-CM

## 2021-10-18 DIAGNOSIS — F840 Autistic disorder: Secondary | ICD-10-CM | POA: Diagnosis present

## 2021-10-18 DIAGNOSIS — R29898 Other symptoms and signs involving the musculoskeletal system: Secondary | ICD-10-CM | POA: Diagnosis present

## 2021-10-18 DIAGNOSIS — E871 Hypo-osmolality and hyponatremia: Secondary | ICD-10-CM | POA: Diagnosis not present

## 2021-10-18 DIAGNOSIS — R29810 Facial weakness: Secondary | ICD-10-CM | POA: Diagnosis present

## 2021-10-18 DIAGNOSIS — Z832 Family history of diseases of the blood and blood-forming organs and certain disorders involving the immune mechanism: Secondary | ICD-10-CM

## 2021-10-18 DIAGNOSIS — E876 Hypokalemia: Secondary | ICD-10-CM | POA: Diagnosis present

## 2021-10-18 DIAGNOSIS — Z20822 Contact with and (suspected) exposure to covid-19: Secondary | ICD-10-CM | POA: Diagnosis present

## 2021-10-18 DIAGNOSIS — G35 Multiple sclerosis: Principal | ICD-10-CM | POA: Diagnosis present

## 2021-10-18 DIAGNOSIS — Z79899 Other long term (current) drug therapy: Secondary | ICD-10-CM

## 2021-10-18 DIAGNOSIS — R296 Repeated falls: Secondary | ICD-10-CM | POA: Diagnosis present

## 2021-10-18 LAB — COMPREHENSIVE METABOLIC PANEL
ALT: 17 U/L (ref 0–44)
AST: 21 U/L (ref 15–41)
Albumin: 4.6 g/dL (ref 3.5–5.0)
Alkaline Phosphatase: 51 U/L (ref 38–126)
Anion gap: 7 (ref 5–15)
BUN: 13 mg/dL (ref 6–20)
CO2: 24 mmol/L (ref 22–32)
Calcium: 9.2 mg/dL (ref 8.9–10.3)
Chloride: 107 mmol/L (ref 98–111)
Creatinine, Ser: 0.68 mg/dL (ref 0.61–1.24)
GFR, Estimated: >60 mL/min (ref >60)
Glucose, Bld: 126 mg/dL — ABNORMAL HIGH (ref 70–99)
Potassium: 3.2 mmol/L — ABNORMAL LOW (ref 3.5–5.1)
Sodium: 138 mmol/L (ref 135–145)
Total Bilirubin: 1 mg/dL (ref 0.3–1.2)
Total Protein: 7.5 g/dL (ref 6.5–8.1)

## 2021-10-18 LAB — CBC WITH DIFFERENTIAL/PLATELET
Abs Immature Granulocytes: 0.03 10*3/uL (ref 0.00–0.07)
Basophils Absolute: 0.1 10*3/uL (ref 0.0–0.1)
Basophils Relative: 1 %
Eosinophils Absolute: 0.2 10*3/uL (ref 0.0–0.5)
Eosinophils Relative: 1 %
HCT: 44.9 % (ref 39.0–52.0)
Hemoglobin: 15.3 g/dL (ref 13.0–17.0)
Immature Granulocytes: 0 %
Lymphocytes Relative: 29 %
Lymphs Abs: 3.6 10*3/uL (ref 0.7–4.0)
MCH: 30.5 pg (ref 26.0–34.0)
MCHC: 34.1 g/dL (ref 30.0–36.0)
MCV: 89.6 fL (ref 80.0–100.0)
Monocytes Absolute: 0.7 10*3/uL (ref 0.1–1.0)
Monocytes Relative: 6 %
Neutro Abs: 8 10*3/uL — ABNORMAL HIGH (ref 1.7–7.7)
Neutrophils Relative %: 63 %
Platelets: 232 10*3/uL (ref 150–400)
RBC: 5.01 MIL/uL (ref 4.22–5.81)
RDW: 13.9 % (ref 11.5–15.5)
WBC: 12.6 10*3/uL — ABNORMAL HIGH (ref 4.0–10.5)
nRBC: 0 % (ref 0.0–0.2)

## 2021-10-18 LAB — MAGNESIUM: Magnesium: 1.9 mg/dL (ref 1.7–2.4)

## 2021-10-18 LAB — CK: Total CK: 447 U/L — ABNORMAL HIGH (ref 49–397)

## 2021-10-18 MED ORDER — POTASSIUM CHLORIDE CRYS ER 20 MEQ PO TBCR
40.0000 meq | EXTENDED_RELEASE_TABLET | Freq: Once | ORAL | Status: AC
  Administered 2021-10-19: 40 meq via ORAL
  Filled 2021-10-18: qty 2

## 2021-10-18 NOTE — ED Triage Notes (Signed)
Parents states that pt is having an MS flare up. Pt is having slurred speech and his legs are locking up since Thursday. Pt usually is active and is able to walk around independently.

## 2021-10-18 NOTE — ED Provider Triage Note (Signed)
Emergency Medicine Provider Triage Evaluation Note  Medical Decision Making  Medically screening exam initiated at 8:36 PM.  Appropriate orders placed.  AUSTINJAMES TREASE was informed that the remainder of the evaluation will be completed by another provider, this initial triage assessment does not replace that evaluation, and the importance of remaining in the ED until their evaluation is complete.  Please see main note.    Lorin Glass, [place] 10/18/21 2037

## 2021-10-18 NOTE — ED Provider Notes (Signed)
[facility] DEPT Provider Note   CSN: [id number] Arrival date & time: 10/18/21  2012     History  Chief Complaint  Patient presents with   MS flare up    Jaime [name] is a 26 y.o. male with a past medical history of autism, MS, depression, who presents today for evaluation of weakness and slurred speech. History is obtained from patient, family at bedside and chart review. Patient over the past month has had worsening weakness in his legs, initially starting in the right leg however now in both of his legs.  He has also developed worsening slurred speech over the past 2 to 3 days however it has been present for a week.  He has fallen multiple times during this. Patient describes his right leg as "useless dead weight."  He denies any injuries from his falls.  He denies any weakness in his arms bilaterally.  Patient is compliant with all medications.  He did have anatalizumab/Tysabri infusion 3 days ago.  No fevers reported.  No nausea or vomiting. Mother is concerned that he may be losing weight despite drinking and eating adequately.  HPI     Home Medications Prior to Admission medications   Medication Sig Start Date End Date Taking? Authorizing Provider  acetaminophen (TYLENOL) 325 MG tablet Take 2 tablets (650 mg total) by mouth every 4 (four) hours as needed for mild pain or fever (or Fever >/= 101). 06/04/20   Angiulli, Lavon Paganini, PA-C  Baclofen 5 MG TABS Take 5 mg by mouth 3 (three) times daily. 08/31/21   Lovorn, Jinny Blossom, MD  docusate sodium (COLACE) 100 MG capsule Take 1 capsule (100 mg total) by mouth 3 (three) times daily. 07/25/20   Angiulli, Lavon Paganini, PA-C  DULoxetine (CYMBALTA) 60 MG capsule Take 1 capsule (60 mg total) by mouth daily. 08/31/21   Lovorn, Jinny Blossom, MD  midodrine (PROAMATINE) 10 MG tablet Take 1 tablet (10 mg total) by mouth 2 (two) times daily with a meal. 08/31/21   Lovorn, Jinny Blossom, MD  ondansetron (ZOFRAN) 4 MG tablet Take  1 tablet (4 mg total) by mouth every 8 (eight) hours as needed for nausea or vomiting. 04/03/21   Courtney Heys, MD      Allergies    Patient has no known allergies.    Review of Systems   Review of Systems See above Physical Exam Updated Vital Signs BP 113/78    Pulse (!) 109    Temp 98.3 F (36.8 C) (Oral)    Resp 15    Ht 5\' 9"  (1.753 m)    Wt 51.3 kg    SpO2 96%    BMI 16.69 kg/m  Physical Exam Vitals and nursing note reviewed.  Constitutional:      General: He is not in acute distress.    Appearance: He is not diaphoretic.  HENT:     Head: Normocephalic and atraumatic.  Eyes:     General: No scleral icterus.       Right eye: No discharge.        Left eye: No discharge.     Conjunctiva/sclera: Conjunctivae normal.  Cardiovascular:     Rate and Rhythm: Normal rate and regular rhythm.  Pulmonary:     Effort: Pulmonary effort is normal. No respiratory distress.     Breath sounds: No stridor.  Abdominal:     General: There is no distension.  Musculoskeletal:        General: No deformity.  Cervical back: Normal range of motion and neck supple.  Skin:    General: Skin is warm and dry.  Neurological:     Mental Status: He is alert.     Motor: No abnormal muscle tone.     Comments: Patient's speech is slightly slurred.  There is left-sided facial droop in comparison to the right.  Uvula elevates symmetrically.  5/5 grip strength bilaterally.  4/5 strength in the left leg with4-/5 strength in the right leg.  No obvious pronator drift, however neuro exam is somewhat limited given patient difficulty in following instructions. Coordination is abnormal, patient has difficulty grabbing objects.  Mild nystagmus noted, however again exam limited due to patient's difficulty in following multistep directions. Unable to assess gait due to weakness.  Psychiatric:        Behavior: Behavior normal.    ED Results / Procedures / Treatments   Labs (all labs ordered are listed, but only  abnormal results are displayed) Labs Reviewed  COMPREHENSIVE METABOLIC PANEL - Abnormal; Notable for the following components:      Result Value   Potassium 3.2 (*)    Glucose, Bld 126 (*)    All other components within normal limits  CBC WITH DIFFERENTIAL/PLATELET - Abnormal; Notable for the following components:   WBC 12.6 (*)    Neutro Abs 8.0 (*)    All other components within normal limits  CK - Abnormal; Notable for the following components:   Total CK 447 (*)    All other components within normal limits  RESP PANEL BY RT-PCR (FLU A&B, COVID) ARPGX2  MAGNESIUM  URINALYSIS, ROUTINE W REFLEX MICROSCOPIC    EKG EKG Interpretation  Date/Time:  Sunday October 18 2021 21:11:36 EST Ventricular Rate:  75 PR Interval:  152 QRS Duration: 92 QT Interval:  360 QTC Calculation: 402 R Axis:   89 Text Interpretation: Sinus rhythm Consider left atrial enlargement Benign early repolarization similar to prior ecg Confirmed by Dorie Rank [phone]) on 10/18/2021 9:13:34 PM  Radiology DG Chest 2 View  Result Date: 10/18/2021 CLINICAL DATA:  MS flair EXAM: CHEST - 2 VIEW COMPARISON:  Chest x-ray 05/30/2020 FINDINGS: The heart and mediastinal contours are within normal limits. No focal consolidation. No pulmonary edema. No pleural effusion. No pneumothorax. No acute osseous abnormality. IMPRESSION: No active cardiopulmonary disease. Electronically Signed   By: Iven Finn M.D.   On: 10/18/2021 22:20   CT Head Wo Contrast  Result Date: 10/18/2021 CLINICAL DATA:  Head trauma. History of multiple sclerosis. Multiple falls. EXAM: CT HEAD WITHOUT CONTRAST TECHNIQUE: Contiguous axial images were obtained from the base of the skull through the vertex without intravenous contrast. RADIATION DOSE REDUCTION: This exam was performed according to the departmental dose-optimization program which includes automated exposure control, adjustment of the mA and/or kV according to patient size and/or use of  iterative reconstruction technique. COMPARISON:  None. FINDINGS: Brain: There is no mass, hemorrhage or extra-axial collection. The size and configuration of the ventricles and extra-axial CSF spaces are normal. The brain parenchyma is normal, without acute or chronic infarction. Vascular: No abnormal hyperdensity of the major intracranial arteries or dural venous sinuses. No intracranial atherosclerosis. Skull: The visualized skull base, calvarium and extracranial soft tissues are normal. Sinuses/Orbits: No fluid levels or advanced mucosal thickening of the visualized paranasal sinuses. No mastoid or middle ear effusion. The orbits are normal. IMPRESSION: Normal head CT. Electronically Signed   By: Ulyses Jarred M.D.   On: 10/18/2021 21:06    Procedures  Procedures    Medications Ordered in ED Medications  potassium chloride SA (KLOR-CON M) CR tablet 40 mEq (has no administration in time range)    ED Course/ Medical Decision Making/ A&P                           Medical Decision Making   This patient presents to the ED for concern of weakness, slurred speech, this involves an extensive number of treatment options, and is a complaint that carries with it a high risk of complications and morbidity.  The differential diagnosis includes MS flare, stroke, electrolyte abnormality, meningitis, bacterial infection, viral infection, myositis, toxidrome, encephalopathy   Co morbidities that complicate the patient evaluation  Autism   Additional history obtained:  Additional history obtained from parents at bedside   Lab Tests:  I Ordered, and personally interpreted labs.  The pertinent results include: Mild hypokalemia with a potassium of 3.2   Imaging Studies ordered:  I ordered imaging studies including CT head is ordered due to recent falls without intracranial hemorrhage, MRI brain, and MRI C-spine both with and without contrast is ordered, unable to be obtained tonight. I agree with  the radiologist interpretation   Through secure chat patient was discussed with neurology specific for imaging recommendations and if patient is okay to stay at [facility]. There was consideration for transfer for MRI over to [facility], however due to extensive boarding in the department this was not an option.  Especially as patient symptoms have been gradually worsening and ongoing over prolonged time. Per neurology patient is okay to stay at [facility], recommends reconsulting neuro with MRI results.  Problem List / ED Course:  Suspected MS flare, weakness, hypokalemia   Dispostion:  After consideration of the diagnostic results and the patients response to treatment, I feel that the patent would benefit from admission.  Even if his MRI does not show evidence of a acute MS flare he has objective weakness, slurred speech and facial droop all of which is new and is reportedly limiting his ability to perform ADLs.  I spoke with hospitalist who will see patient for admission. I updated patient's mother on plan of care for admission and she is in agreement.  The patient appears reasonably stabilized for admission considering the current resources, flow, and capabilities available in the ED at this time, and I doubt any other [facility] requiring further screening and/or treatment in the ED prior to admission assuming timely admission and bed placement.  Note: Portions of this report may have been transcribed using voice recognition software. Every effort was made to ensure accuracy; however, inadvertent computerized transcription errors may be present  Final Clinical Impression(s) / ED Diagnoses Final diagnoses:  MS (multiple sclerosis) ([place])  Weakness  Slurred speech  Facial droop    Rx / DC Orders ED Discharge Orders     None         Lorin Glass, PA-C 10/19/21 0054    Dorie Rank, MD 10/19/21 1506

## 2021-10-19 ENCOUNTER — Encounter (HOSPITAL_COMMUNITY): Payer: Self-pay | Admitting: Family Medicine

## 2021-10-19 ENCOUNTER — Observation Stay (HOSPITAL_COMMUNITY): Payer: Medicaid Other

## 2021-10-19 DIAGNOSIS — E871 Hypo-osmolality and hyponatremia: Secondary | ICD-10-CM | POA: Diagnosis not present

## 2021-10-19 DIAGNOSIS — I959 Hypotension, unspecified: Secondary | ICD-10-CM | POA: Diagnosis present

## 2021-10-19 DIAGNOSIS — Z832 Family history of diseases of the blood and blood-forming organs and certain disorders involving the immune mechanism: Secondary | ICD-10-CM | POA: Diagnosis not present

## 2021-10-19 DIAGNOSIS — R270 Ataxia, unspecified: Secondary | ICD-10-CM | POA: Diagnosis present

## 2021-10-19 DIAGNOSIS — Z20822 Contact with and (suspected) exposure to covid-19: Secondary | ICD-10-CM | POA: Diagnosis present

## 2021-10-19 DIAGNOSIS — I951 Orthostatic hypotension: Secondary | ICD-10-CM | POA: Diagnosis not present

## 2021-10-19 DIAGNOSIS — G35 Multiple sclerosis: Principal | ICD-10-CM

## 2021-10-19 DIAGNOSIS — Z79899 Other long term (current) drug therapy: Secondary | ICD-10-CM | POA: Diagnosis not present

## 2021-10-19 DIAGNOSIS — R296 Repeated falls: Secondary | ICD-10-CM | POA: Diagnosis present

## 2021-10-19 DIAGNOSIS — E876 Hypokalemia: Secondary | ICD-10-CM | POA: Diagnosis present

## 2021-10-19 DIAGNOSIS — F32A Depression, unspecified: Secondary | ICD-10-CM | POA: Diagnosis present

## 2021-10-19 DIAGNOSIS — R4781 Slurred speech: Secondary | ICD-10-CM

## 2021-10-19 DIAGNOSIS — R29810 Facial weakness: Secondary | ICD-10-CM | POA: Diagnosis present

## 2021-10-19 DIAGNOSIS — F840 Autistic disorder: Secondary | ICD-10-CM | POA: Diagnosis present

## 2021-10-19 LAB — BASIC METABOLIC PANEL
Anion gap: 6 (ref 5–15)
BUN: 14 mg/dL (ref 6–20)
CO2: 25 mmol/L (ref 22–32)
Calcium: 8.5 mg/dL — ABNORMAL LOW (ref 8.9–10.3)
Chloride: 109 mmol/L (ref 98–111)
Creatinine, Ser: 0.83 mg/dL (ref 0.61–1.24)
GFR, Estimated: >60 mL/min (ref >60)
Glucose, Bld: 97 mg/dL (ref 70–99)
Potassium: 3.5 mmol/L (ref 3.5–5.1)
Sodium: 140 mmol/L (ref 135–145)

## 2021-10-19 LAB — RESP PANEL BY RT-PCR (FLU A&B, COVID) ARPGX2
Influenza A by PCR: NEGATIVE
Influenza B by PCR: NEGATIVE
SARS Coronavirus 2 by RT PCR: NEGATIVE

## 2021-10-19 LAB — CBC
HCT: 40.5 % (ref 39.0–52.0)
Hemoglobin: 13.6 g/dL (ref 13.0–17.0)
MCH: 30.3 pg (ref 26.0–34.0)
MCHC: 33.6 g/dL (ref 30.0–36.0)
MCV: 90.2 fL (ref 80.0–100.0)
Platelets: 200 10*3/uL (ref 150–400)
RBC: 4.49 MIL/uL (ref 4.22–5.81)
RDW: 13.9 % (ref 11.5–15.5)
WBC: 9.7 10*3/uL (ref 4.0–10.5)
nRBC: 0.2 % (ref 0.0–0.2)

## 2021-10-19 LAB — URINALYSIS, ROUTINE W REFLEX MICROSCOPIC
Bilirubin Urine: NEGATIVE
Glucose, UA: NEGATIVE mg/dL
Hgb urine dipstick: NEGATIVE
Ketones, ur: NEGATIVE mg/dL
Leukocytes,Ua: NEGATIVE
Nitrite: NEGATIVE
Protein, ur: NEGATIVE mg/dL
Specific Gravity, Urine: 1.02 (ref 1.005–1.030)
pH: 6 (ref 5.0–8.0)

## 2021-10-19 MED ORDER — PANTOPRAZOLE SODIUM 40 MG IV SOLR
40.0000 mg | INTRAVENOUS | Status: AC
  Administered 2021-10-19 – 2021-10-23 (×5): 40 mg via INTRAVENOUS
  Filled 2021-10-19 (×5): qty 40

## 2021-10-19 MED ORDER — ACETAMINOPHEN 325 MG PO TABS: 650.0000 mg | ORAL_TABLET | Freq: Four times a day (QID) | ORAL | Status: DC | PRN

## 2021-10-19 MED ORDER — GADOBUTROL 1 MMOL/ML IV SOLN
5.0000 mL | Freq: Once | INTRAVENOUS | Status: AC | PRN
  Administered 2021-10-19: 5 mL via INTRAVENOUS

## 2021-10-19 MED ORDER — ACETAMINOPHEN 650 MG RE SUPP: 650.0000 mg | Freq: Four times a day (QID) | RECTAL | Status: DC | PRN

## 2021-10-19 MED ORDER — ACETAMINOPHEN 500 MG PO TABS: 1000.0000 mg | ORAL_TABLET | Freq: Four times a day (QID) | ORAL | Status: DC | PRN

## 2021-10-19 MED ORDER — BACLOFEN 10 MG PO TABS
5.0000 mg | ORAL_TABLET | Freq: Three times a day (TID) | ORAL | Status: DC
  Administered 2021-10-19 – 2021-10-24 (×15): 5 mg via ORAL
  Filled 2021-10-19 (×16): qty 1

## 2021-10-19 MED ORDER — ONDANSETRON HCL 4 MG PO TABS: 4.0000 mg | ORAL_TABLET | Freq: Every day | ORAL | Status: DC | PRN

## 2021-10-19 MED ORDER — SODIUM CHLORIDE 0.9 % IV SOLN
1000.0000 mg | INTRAVENOUS | Status: AC
  Administered 2021-10-19 – 2021-10-23 (×5): 1000 mg via INTRAVENOUS
  Filled 2021-10-19 (×5): qty 16

## 2021-10-19 MED ORDER — SODIUM CHLORIDE 0.9 % IV SOLN: 30.0000 mg/kg | Freq: Once | INTRAVENOUS | Status: DC

## 2021-10-19 MED ORDER — SODIUM CHLORIDE 0.9 % IV SOLN: 5.4000 mg/kg/h | INTRAVENOUS | Status: DC

## 2021-10-19 MED ORDER — DOCUSATE SODIUM 100 MG PO CAPS
100.0000 mg | ORAL_CAPSULE | Freq: Three times a day (TID) | ORAL | Status: DC | PRN
  Administered 2021-10-21 – 2021-10-22 (×2): 100 mg via ORAL
  Filled 2021-10-19 (×2): qty 1

## 2021-10-19 MED ORDER — SODIUM CHLORIDE 0.9% FLUSH
3.0000 mL | Freq: Two times a day (BID) | INTRAVENOUS | Status: DC
  Administered 2021-10-19 – 2021-10-23 (×7): 3 mL via INTRAVENOUS

## 2021-10-19 MED ORDER — DULOXETINE HCL 60 MG PO CPEP
60.0000 mg | ORAL_CAPSULE | Freq: Every day | ORAL | Status: DC
  Administered 2021-10-19 – 2021-10-24 (×6): 60 mg via ORAL
  Filled 2021-10-19 (×2): qty 1
  Filled 2021-10-19: qty 2
  Filled 2021-10-19 (×3): qty 1

## 2021-10-19 MED ORDER — SODIUM CHLORIDE 0.9% FLUSH: 3.0000 mL | INTRAVENOUS | Status: DC | PRN

## 2021-10-19 MED ORDER — SODIUM CHLORIDE 0.9 % IV SOLN
250.0000 mL | INTRAVENOUS | Status: DC | PRN
  Administered 2021-10-23: 50 mL via INTRAVENOUS

## 2021-10-19 MED ORDER — ENOXAPARIN SODIUM 40 MG/0.4ML IJ SOSY
40.0000 mg | PREFILLED_SYRINGE | INTRAMUSCULAR | Status: DC
  Administered 2021-10-19 – 2021-10-23 (×5): 40 mg via SUBCUTANEOUS
  Filled 2021-10-19 (×5): qty 0.4

## 2021-10-19 NOTE — Consult Note (Signed)
Neurology Consultation  Reason for Consult: MS flare.  Referring Physician: Theodis Sato, DO.   CC: history of MS with leg weakness.   History is obtained from: patient, chart.   HPI: Jaime [name] is a 26 y.o. male with a PMHx of MS, autism, depression, spacicity, and hypotension. Patient presented 16 hours ago to [facility] ED with c/o increased weakness over 1-2 weeks, 3 days of slurred speech and mild drooping to left face. The symptoms are consistent with his MS flares in the past. The weakness of his legs has resulted in him not being able to ambulate or perform ADLs like he normally does. He denies slurred speech now. He states his RLE is only affected now with feeling of dead weight. LLE is back to normal. He has had no infection recently-n/v/d, fever, COVID, cold.   ED workup revealed negative for acute on CTH. MRI of the rain and Cspine with new demyelinating lesions, consistent with MS flare. He was started on high dose steroids and can remain at [facility] for care.   He has been voiding normally. No vision changes.   Neurology asked to consult for possible MS flare.   ROS: A robust ROS was performed and is negative except as noted in the HPI.   Past Medical History:  Diagnosis Date   Autism    Depression    MS (multiple sclerosis) (HCC)    Family History  Problem Relation Age of Onset   Clotting disorder Mother    Social History:   reports that he has never smoked. He has never used smokeless tobacco. He reports that he does not drink alcohol and does not use drugs.  Medications  Current Facility-Administered Medications:    0.9 %  sodium chloride infusion, 250 mL, Intravenous, PRN, Chotiner, Claudean Severance, MD   acetaminophen (TYLENOL) tablet 650 mg, 650 mg, Oral, Q6H PRN **OR** acetaminophen (TYLENOL) suppository 650 mg, 650 mg, Rectal, Q6H PRN, Chotiner, Claudean Severance, MD   baclofen (LIORESAL) tablet 5 mg, 5 mg, Oral, TID, Swayze, Ava, DO   docusate sodium (COLACE) capsule 100 mg, 100  mg, Oral, TID PRN, Swayze, Ava, DO   DULoxetine (CYMBALTA) DR capsule 60 mg, 60 mg, Oral, Daily, Swayze, Ava, DO, 60 mg at 10/19/21 1128   methylPREDNISolone sodium succinate (SOLU-MEDROL) 1,000 mg in sodium chloride 0.9 % 50 mL IVPB, 1,000 mg, Intravenous, Q24H, Last Rate: 66 mL/hr at 10/19/21 1238, 1,000 mg at 10/19/21 1238 **AND** pantoprazole (PROTONIX) injection 40 mg, 40 mg, Intravenous, Q24H, Erick Blinks, MD, 40 mg at 10/19/21 1240   ondansetron (ZOFRAN) tablet 4 mg, 4 mg, Oral, Daily PRN, Swayze, Ava, DO   sodium chloride flush (NS) 0.9 % injection 3 mL, 3 mL, Intravenous, Q12H, Chotiner, Claudean Severance, MD, 3 mL at 10/19/21 1240   sodium chloride flush (NS) 0.9 % injection 3 mL, 3 mL, Intravenous, PRN, Chotiner, Claudean Severance, MD  Current Outpatient Medications:    acetaminophen (TYLENOL) 500 MG tablet, Take 1,000 mg by mouth every 6 (six) hours as needed for headache (pain)., Disp: , Rfl:    Baclofen 5 MG TABS, Take 5 mg by mouth 3 (three) times daily., Disp: 90 tablet, Rfl: 11   docusate sodium (COLACE) 100 MG capsule, Take 1 capsule (100 mg total) by mouth 3 (three) times daily. (Patient taking differently: Take 100 mg by mouth 3 (three) times daily as needed (constipation).), Disp: 10 capsule, Rfl: 0   DULoxetine (CYMBALTA) 60 MG capsule, Take 1 capsule (60 mg total) by  mouth daily. (Patient taking differently: Take 60 mg by mouth daily as needed (depression/sadness (give zofran with each dose)).), Disp: 90 capsule, Rfl: 5   midodrine (PROAMATINE) 10 MG tablet, Take 1 tablet (10 mg total) by mouth 2 (two) times daily with a meal., Disp: 60 tablet, Rfl: 11   ondansetron (ZOFRAN) 4 MG tablet, Take 1 tablet (4 mg total) by mouth every 8 (eight) hours as needed for nausea or vomiting. (Patient taking differently: Take 4 mg by mouth daily as needed (with each dose of Cymbalta (duloxetine)).), Disp: 60 tablet, Rfl: 0   Exam: Current vital signs: BP 122/89    Pulse 84    Temp 98.3 F (36.8 C)  (Oral)    Resp 17    Ht 5\' 9"  (1.753 m)    Wt 51.3 kg    SpO2 98%    BMI 16.69 kg/m  Vital signs in last 24 hours: Temp:  [98.3 F (36.8 C)] 98.3 F (36.8 C) (01/15 2016) Pulse Rate:  [50-109] 84 (01/16 1130) Resp:  [12-18] 17 (01/16 1130) BP: (109-122)/(76-89) 122/89 (01/16 1101) SpO2:  [94 %-100 %] 98 % (01/16 1130) Weight:  [51.3 kg] 51.3 kg (01/15 2016)  PE: GENERAL:Fairly well appearing male. Awake, alert in NAD.  HEENT: normocephalic and atraumatic. LUNGS - Normal respiratory effort.  CV - RRR on tele. ABDOMEN - Soft, nontender. Ext: warm, well perfused. Psych: affect light.   NEURO:  Mental Status: Alert and oriented. Follows commands.  Speech/Language: speech is without dysarthria or aphasia.  Naming, repetition, fluency, and comprehension intact.  Cranial Nerves:  II: PERRL 51mm/brisk. visual fields full.  III, IV, VI: EOMI. Lid elevation symmetric and full.  V: sensation is intact and symmetrical to face.  VII: Minor left facial droop.   VIII:hearing intact to voice. IX, X: palate elevation is symmetric. Phonation normal.  XI: normal sternocleidomastoid and trapezius muscle strength. 1m is symmetrical without fasciculations.   Motor:  RUE/LUE 5/5.  RLE:  knee  4+/5    thigh  4+/5   plantar flexion  4/5   dorsiflexion   4/5 LLE:  knee  4/5   thigh  4/5     plantar flexion   5/5     dorsiflexion   5/5 He is able to lift his LLE off bed without drift. Slightly more effort required with lifting of RLE.  Tone is normal. Bulk is normal.  Sensation- Intact to light touch bilaterally in all four extremities. Extinction absent to DSS.  Coordination: No ataxia to UEs. Did not test HKS. No tremors, jerking, shaking noted.   DTRs:  UEs 1+ LEs 2+ Gait- deferred for safety.  Labs CK 447.   CBC    Component Value Date/Time   WBC 9.7 10/19/2021 0407   RBC 4.49 10/19/2021 0407   HGB 13.6 10/19/2021 0407   HGB 15.2 01/22/2021 1639   HCT 40.5 10/19/2021 0407    HCT 45.7 01/22/2021 1639   PLT 200 10/19/2021 0407   PLT 245 01/22/2021 1639   MCV 90.2 10/19/2021 0407   MCV 90 01/22/2021 1639   MCH 30.3 10/19/2021 0407   MCHC 33.6 10/19/2021 0407   RDW 13.9 10/19/2021 0407   RDW 13.6 01/22/2021 1639   LYMPHSABS 3.6 10/18/2021 2024   LYMPHSABS 3.8 (H) 01/22/2021 1639   MONOABS 0.7 10/18/2021 2024   EOSABS 0.2 10/18/2021 2024   EOSABS 0.3 01/22/2021 1639   BASOSABS 0.1 10/18/2021 2024   BASOSABS 0.1 01/22/2021 1639    CMP  Component Value Date/Time   NA 140 10/19/2021 0407   K 3.5 10/19/2021 0407   CL 109 10/19/2021 0407   CO2 25 10/19/2021 0407   GLUCOSE 97 10/19/2021 0407   BUN 14 10/19/2021 0407   CREATININE 0.83 10/19/2021 0407   CALCIUM 8.5 (L) 10/19/2021 0407   PROT 7.5 10/18/2021 2024   ALBUMIN 4.6 10/18/2021 2024   ALBUMIN 4.6 12/12/2019 0002   AST 21 10/18/2021 2024   ALT 17 10/18/2021 2024   ALKPHOS 51 10/18/2021 2024   BILITOT 1.0 10/18/2021 2024   GFRNONAA >60 10/19/2021 0407   GFRAA >60 06/02/2020 0516   Imaging MD reviewed the images obtained.  MRI brain and Cspine:  1. Extensive multiple sclerosis with multiple new lesions in the brain and spinal cord. 2. At least 9 enhancing supratentorial lesions consistent with active demyelination. 3. No definite enhancing lesions in the cervical spine.   Assessment: 26 yo male with a history of MS who presents with LE weakness, worse on the right with accompanied left facial droop and slurred speech which has resolved. He has not been incontinent or had vision changes. His imaging is consistent with new enhancing lesions of active demyelination.   Impression: -MS flare.   Recommendations/Plan:  -Solumedrol 1 gm IV q 24 hours x 5 days.  -Protonix IV for GI protection.  -Monitor for improvement.  -We will likely see patient qod while on steroids.   Pt seen by Jimmye Norman, NP/Neuro and later by MD. Note/plan to be edited by MD as needed.  Pager: [phone]     NEUROHOSPITALIST ADDENDUM Performed a face to face diagnostic evaluation.   I have reviewed the contents of history and physical exam as documented by PA/ARNP/Resident and agree with above documentation.  I have discussed and formulated the above plan as documented. Edits to the note have been made as needed.  Impression/Key exam findings/Plan: 26 y/o male with known MS on Tysabri with last injection on 1/12 who presents with R leg weakness and all extremity ataxia/incoordination. Found to have multiple enhancing lesion on MRI Brain and C spine.  Will do IV Solumedrol 1000mg  daily x 5 days along with PPI. Plan discussed with Dr. and with patient and his mother at bedside.  Gerri Lins, MD [facility] Erick Blinks   If 7pm to 7am, please call on call as listed on AMION.

## 2021-10-19 NOTE — ED Notes (Signed)
Pt is transferred to MRI.

## 2021-10-19 NOTE — Progress Notes (Signed)
PROGRESS NOTE  Jaime [name] [id number] DOB: [birth year] DOA: 10/18/2021 PCP: Katherina Mires, MD  Brief History   Jaime [name] is a 26 y.o. male with medical history significant for MS, austism spectrum disorder who presented for evaluation of possible MS flare up.  Mother was in the emergency room initially and reported to the ER PA that patient has been having increasing weakness over the last 1 to 2 weeks with 2 to 3 days of slurred speech and a mild drooping of the left side of his face.  Mother reported that has had the symptoms in the past when he had an multiple sclerosis flare.  Reportedly he has walked due to severe weakness in his right leg and would have to be helped and carried from place to place in the house by family members.  Patient states that he has had weakness in his legs and he had a few falls at home but did not have any head injury or loss of consciousness.  No family is at the bedside at this time.  Reports that the weakness started in the right leg but now involves the both legs.  Patient is able to speak now and speech is understandable and not slurred.  Patient reports that it has been slurred for the last week but was worse for the last 2 to 3 days but is getting better he thinks.  Difficult to get a history from him secondary to autism spectrum disorder. Reportedly has been compliant with his medications and did receive anatalizumab/Tysabri infusion 3 days ago. Lives with family.  No tobacco alcohol or illicit drug use.   ED Course: Jaime [name] has been hemodynamically stable and afebrile in the emergency room.  CT of the head was negative for acute intracranial pathology.  Chest x-ray is negative for infiltrate or consolidation.  Lab work reveals WBC of 12,600 hemoglobin 13.3 hematocrit 44.9 platelets 232,000, CK4 47, sodium 138 potassium 3.2 chloride 107 bicarb 24 creatinine 0.68 BUN 13 glucose 126 magnesium 1.9 LFTs normal.  Urinalysis is pending.  COVID swab  pending.  Case was discussed with neurology who recommended MRI of the brain and C-spine with and without contrast to evaluate for MS exacerbation and if positive neurology will be officially consulted and patient be started on high-dose IV steroids.  MRI cannot be obtained tonight at [facility] but neurology stated he could wait until the morning and no need to transfer patient to [facility] at this time.  MRI Brain and C-spine has demonstrated extensive multiple sclerosis with multiple new lesions in the brain and spinal cord. There wer at least 9 enhancing supratentorial lesions consistent with active demyelination, There wer no definite enhancing lesions in the C-spine. Neurology has been consulted and the patient has been placed on high dose steroids.  Consultants  Neurology  Procedures  None  Antibiotics   Anti-infectives (From admission, onward)    None      Subjective  The patient is resting comfortably. No new complaints.  Objective   Vitals:  Vitals:   10/19/21 1330 10/19/21 1445  BP: 108/68 116/83  Pulse: (!) 101 90  Resp: 20 18  Temp:  98.6 F (37 C)  SpO2: 100% 97%    Exam:  Constitutional:  The patient is awake, alert, and oriented x 3. No acute distress. Respiratory:  No increased work of breathing. No wheezes, rales, or rhonchi No tactile fremitus Cardiovascular:  Regular rate and rhythm No murmurs, ectopy, or gallups.  No lateral PMI. No thrills. Abdomen:  Abdomen is soft, non-tender, non-distended No hernias, masses, or organomegaly Normoactive bowel sounds.  Musculoskeletal:  No cyanosis, clubbing, or edema Skin:  No rashes, lesions, ulcers palpation of skin: no induration or nodules Neurologic:  CN 2-12 intact Sensation all 4 extremities intact Psychiatric:  Mental status Mood, affect appropriate Orientation to person, place, time  judgment and insight appear intact  I have personally reviewed the following:   Today's Data   Vitals  Lab Data  BMP CBC  Micro Data    Imaging  CXR : No active cardiopulmonary disease MRI Brain and C-Spine  Cardiology Data  EKG  Other Data    Scheduled Meds:  baclofen  5 mg Oral TID   DULoxetine  60 mg Oral Daily   pantoprazole (PROTONIX) IV  40 mg Intravenous Q24H   sodium chloride flush  3 mL Intravenous Q12H   Continuous Infusions:  sodium chloride     methylPREDNISolone (SOLU-MEDROL) injection Stopped (10/19/21 1330)    Principal Problem:   Multiple sclerosis (HCC) Active Problems:   Lower extremity weakness   Multiple sclerosis exacerbation (HCC)   Autism spectrum disorder   Hypokalemia   Slurred speech   LOS: 0 days   A & P  Acute Exacerbation/Flare of MS: MRI Brain and C-Spine demonstrated: MRI Brain and C-spine has demonstrated extensive multiple sclerosis with multiple new lesions in the brain and spinal cord. There wer at least 9 enhancing supratentorial lesions consistent with active demyelination, There wer no definite enhancing lesions in the C-spine. Neurology has been consulted and the patient has been placed on high dose steroids.  Lower Extremity Weakness: Due to MS exacerbation and Flare. Patient is on High dose steroid regimen. Neurology is consulted. PT/OT when appropriate.  Slurred Speech: Due to MS exacerbation. Will have speech evaluate for swallow.   Hypokalemia: Supplement and monitor.  Autism Spectrum Disorder: Noted.  I have seen and examined this patient myself. I have spent 38 minutes in his evaluation and care.  DVT Prophylaxis: Lovenox CODE STATUS: Full Code Family Communication: None available Disposition: tbd.  Jaime Lyness, DO [facility] Direct contact: see [email or web address]  7PM-7AM contact night coverage as above 10/19/2021, 4:46 PM  LOS: 0 days

## 2021-10-19 NOTE — H&P (Signed)
History and Physical    [facility] [id number] DOB: [birth year] DOA: 10/18/2021  PCP: Katherina Mires, MD   Patient coming from: Home  Chief Complaint: Concern for MS flare up  HPI: Jaime [name] is a 26 y.o. male with medical history significant for MS, austism spectrum disorder who presented for evaluation of possible MS flare up.  Mother was in the emergency room initially and reported to the ER PA that patient has been having increasing weakness over the last 1 to 2 weeks with 2 to 3 days of slurred speech and a mild drooping of the left side of his face.  Mother reported that has had the symptoms in the past when he had an multiple sclerosis flare.  Reportedly he has walked due to severe weakness in his right leg and would have to be helped and carried from place to place in the house by family members.  Patient states that he has had weakness in his legs and he had a few falls at home but did not have any head injury or loss of consciousness.  No family is at the bedside at this time.  Reports that the weakness started in the right leg but now involves the both legs.  Patient is able to speak now and speech is understandable and not slurred.  Patient reports that it has been slurred for the last week but was worse for the last 2 to 3 days but is getting better he thinks.  Difficult to get a history from him secondary to autism spectrum disorder. Reportedly has been compliant with his medications and did receive anatalizumab/Tysabri infusion 3 days ago. Lives with family.  No tobacco alcohol or illicit drug use.  ED Course: Mr. Jaime [name] has been hemodynamically stable and afebrile in the emergency room.  CT of the head was negative for acute intracranial pathology.  Chest x-ray is negative for infiltrate or consolidation.  Lab work reveals WBC of 12,600 hemoglobin 13.3 hematocrit 44.9 platelets 232,000, CK4 47, sodium 138 potassium 3.2 chloride 107 bicarb 24 creatinine 0.68 BUN 13  glucose 126 magnesium 1.9 LFTs normal.  Urinalysis is pending.  COVID swab pending.  Case was discussed with neurology who recommended MRI of the brain and C-spine with and without contrast to evaluate for MS exacerbation and if positive neurology will be officially consulted and patient be started on high-dose IV steroids.  MRI cannot be obtained tonight at [facility] but neurology stated he could wait until the morning and no need to transfer patient to [facility] at this time.  Review of Systems:  General: Reports weakness in legs. Denies fever, chills.  Denies dizziness.   HENT: Denies head trauma, headache, denies tinnitus. Denies nasal congestion or bleeding. Denies sore throat.  Denies difficulty swallowing Eyes: Denies blurry vision, pain in eye, drainage.  Denies discoloration of eyes. Neck: Denies pain.  Denies swelling.  Denies pain with movement. Cardiovascular: Denies chest pain, palpitations. Denies edema. Denies orthopnea Respiratory: Denies shortness of breath, cough. Denies wheezing. Denies sputum production Gastrointestinal: Denies abdominal pain, swelling. Denies nausea, vomiting, diarrhea.  Musculoskeletal: Denies limitation of movement.  Denies deformity or swelling.  Denies pain.  Denies arthralgias or myalgias. Genitourinary: Denies pelvic pain.  Denies urinary frequency or hesitancy.  Denies dysuria.  Skin: Denies rash.  Denies petechiae, purpura, ecchymosis. Neurological: Denies syncope. Denies seizure activity. Denies paresthesia. Denies visual change. Psychiatric: Denies depression, anxiety.    Past Medical History:  Diagnosis Date   Autism  Depression    MS (multiple sclerosis) ([place])     Past Surgical History:  Procedure Laterality Date   NO PAST SURGERIES      Social History  reports that he has never smoked. He has never used smokeless tobacco. He reports that he does not drink alcohol and does not use drugs.  No Known Allergies  Family  History  Problem Relation Age of Onset   Clotting disorder Mother      Prior to Admission medications   Medication Sig Start Date End Date Taking? Authorizing Provider  acetaminophen (TYLENOL) 325 MG tablet Take 2 tablets (650 mg total) by mouth every 4 (four) hours as needed for mild pain or fever (or Fever >/= 101). 06/04/20   Angiulli, Lavon Paganini, PA-C  Baclofen 5 MG TABS Take 5 mg by mouth 3 (three) times daily. 08/31/21   Lovorn, Jinny Blossom, MD  docusate sodium (COLACE) 100 MG capsule Take 1 capsule (100 mg total) by mouth 3 (three) times daily. 07/25/20   Angiulli, Lavon Paganini, PA-C  DULoxetine (CYMBALTA) 60 MG capsule Take 1 capsule (60 mg total) by mouth daily. 08/31/21   Lovorn, Jinny Blossom, MD  midodrine (PROAMATINE) 10 MG tablet Take 1 tablet (10 mg total) by mouth 2 (two) times daily with a meal. 08/31/21   Lovorn, Jinny Blossom, MD  ondansetron (ZOFRAN) 4 MG tablet Take 1 tablet (4 mg total) by mouth every 8 (eight) hours as needed for nausea or vomiting. 04/03/21   Courtney Heys, MD    Physical Exam: Vitals:   10/18/21 2016 10/18/21 2150 10/18/21 2309  BP: 113/78 120/77 113/78  Pulse: (!) 104 90 (!) 109  Resp: 16 17 15   Temp: 98.3 F (36.8 C)    TempSrc: Oral    SpO2: 96% 98% 96%  Weight: 51.3 kg    Height: 5\' 9"  (1.753 m)      Constitutional: NAD, calm, comfortable Vitals:   10/18/21 2016 10/18/21 2150 10/18/21 2309  BP: 113/78 120/77 113/78  Pulse: (!) 104 90 (!) 109  Resp: 16 17 15   Temp: 98.3 F (36.8 C)    TempSrc: Oral    SpO2: 96% 98% 96%  Weight: 51.3 kg    Height: 5\' 9"  (1.753 m)     General: WDWN, Alert and oriented to person and place.  Eyes: EOMI, PERRL, conjunctivae normal.  Sclera nonicteric. Mild nystagmus HENT:  [place]/AT, external ears normal.  Nares patent without epistasis.  Mucous membranes are moist. Posterior pharynx clear of any exudate Neck: Soft, normal range of motion, supple, no masses,Trachea midline Respiratory: clear to auscultation bilaterally, no wheezing,  no crackles. Normal respiratory effort. No accessory muscle use.  Cardiovascular: Regular rate and rhythm, no murmurs / rubs / gallops. No extremity edema. 2+ pedal pulses. Abdomen: Soft, no tenderness, nondistended, no rebound or guarding.  No masses palpated. No hepatosplenomegaly. Bowel sounds normoactive Musculoskeletal: FROM upper extremities. Limited movement of lower extremities. no cyanosis. No joint deformity upper and lower extremities. Normal muscle tone.  Skin: Warm, dry, intact no rashes, lesions, ulcers. No induration Neurologic: Mild left facial droop. Normal speech. Sensation intact, patella DTR +1 bilaterally. Grip strength 5/5 bilaterally. Strength 4/5 in bilateral lower extremities. Difficulty coordinating arms to grab objects. No pronator drift. Tongue with normal extension without deviation Psychiatric: Normal mood.   Labs on Admission: I have personally reviewed following labs and imaging studies  CBC: Recent Labs  Lab 10/18/21 2024  WBC 12.6*  NEUTROABS 8.0*  HGB 15.3  HCT 44.9  MCV  89.6  PLT [id number]    Basic Metabolic Panel: Recent Labs  Lab 10/18/21 2024 10/18/21 2207  NA 138  --   K 3.2*  --   CL 107  --   CO2 24  --   GLUCOSE 126*  --   BUN 13  --   CREATININE 0.68  --   CALCIUM 9.2  --   MG  --  1.9    GFR: Estimated Creatinine Clearance: 102.4 mL/min (by C-G formula based on SCr of 0.68 mg/dL).  Liver Function Tests: Recent Labs  Lab 10/18/21 2024  AST 21  ALT 17  ALKPHOS 51  BILITOT 1.0  PROT 7.5  ALBUMIN 4.6    Urine analysis:    Component Value Date/Time   COLORURINE YELLOW 07/15/2020 [place] 07/15/2020 1216   LABSPEC 1.019 07/15/2020 1216   PHURINE 6.0 07/15/2020 1216   GLUCOSEU NEGATIVE 07/15/2020 1216   HGBUR NEGATIVE 07/15/2020 1216   BILIRUBINUR NEGATIVE 07/15/2020 1216   KETONESUR NEGATIVE 07/15/2020 1216   PROTEINUR NEGATIVE 07/15/2020 1216   NITRITE NEGATIVE 07/15/2020 1216   LEUKOCYTESUR NEGATIVE  07/15/2020 1216    Radiological Exams on Admission: DG Chest 2 View  Result Date: 10/18/2021 CLINICAL DATA:  MS flair EXAM: CHEST - 2 VIEW COMPARISON:  Chest x-ray 05/30/2020 FINDINGS: The heart and mediastinal contours are within normal limits. No focal consolidation. No pulmonary edema. No pleural effusion. No pneumothorax. No acute osseous abnormality. IMPRESSION: No active cardiopulmonary disease. Electronically Signed   By: Iven Finn M.D.   On: 10/18/2021 22:20   CT Head Wo Contrast  Result Date: 10/18/2021 CLINICAL DATA:  Head trauma. History of multiple sclerosis. Multiple falls. EXAM: CT HEAD WITHOUT CONTRAST TECHNIQUE: Contiguous axial images were obtained from the base of the skull through the vertex without intravenous contrast. RADIATION DOSE REDUCTION: This exam was performed according to the departmental dose-optimization program which includes automated exposure control, adjustment of the mA and/or kV according to patient size and/or use of iterative reconstruction technique. COMPARISON:  None. FINDINGS: Brain: There is no mass, hemorrhage or extra-axial collection. The size and configuration of the ventricles and extra-axial CSF spaces are normal. The brain parenchyma is normal, without acute or chronic infarction. Vascular: No abnormal hyperdensity of the major intracranial arteries or dural venous sinuses. No intracranial atherosclerosis. Skull: The visualized skull base, calvarium and extracranial soft tissues are normal. Sinuses/Orbits: No fluid levels or advanced mucosal thickening of the visualized paranasal sinuses. No mastoid or middle ear effusion. The orbits are normal. IMPRESSION: Normal head CT. Electronically Signed   By: Ulyses Jarred M.D.   On: 10/18/2021 21:06    EKG: Independently reviewed.  EKG shows normal sinus rhythm with no acute ST elevation or depression.  No signs of ischemia.  QTc 4 2  Assessment/Plan Principal Problem:   Multiple sclerosis  Mr.  [name] has history of MS with exacerbations in the past that presented with weakness and difficulty speaking. Case was discussed with neurology, Dr. Malen Gauze, by the ER PA.  Dr. Marella Bile recommended MRI of the brain and cervical spine with and without contrast and if enhancement and MS exacerbation is confirmed then patient will be started on high-dose steroids and official neurology consult obtained at that time.  Also had evaluation for infectious causes which could lead to exacerbation.  Chest x-ray negative.  UA is pending.  Hypokalemia which was replaced orally in the emergency room  Active Problems:   Lower extremity weakness Is likely MS  exacerbation.  Awaiting MRI of the brain and cervical spine to further evaluate    Slurred speech MRI pending    Hypokalemia Magnesium level is normal.  Patient was given p.o. potassium in the emergency room.  We will recheck electrolytes and renal function in morning    Autism spectrum disorder   DVT prophylaxis: Low Padua score.   Code Status:   Full Code  Family Communication:  Diagnosis and plan discussed with pt. Mother who is POA is not here. Further recommendations to follow as clinically indicated Disposition Plan:   Patient is from:  Home  Anticipated DC to:  Home  Anticipated DC date:  Anticipate less than 2 midnight stay but this may change based on MRI results  Time Spent:   40 minutes   Admission status:  Observation  Eben Burow MD [facility]  How to contact the [facility] Attending or Consulting provider [place] or covering provider during after hours [place], for this patient?   Check the care team in [facility] and look for a) attending/consulting [facility] provider listed and b) the [facility] team listed Log into [email or web address] and use [place]'s universal password to access. If you do not have the password, please contact the hospital operator. Locate the [facility] provider you are looking for under [facility] and page to a number that  you can be directly reached. If you still have difficulty reaching the provider, please page the [facility] (Director on Call) for the Hospitalists listed on amion for assistance.  10/19/2021, 1:09 AM

## 2021-10-20 NOTE — Progress Notes (Deleted)
No chief complaint on file.    HISTORY OF PRESENT ILLNESS:  10/20/21 ALL:  Jaime [name] is a 26 y.o. male here today for follow up for MS. He continues Tysabri infusions. JCV 0.16 01/2021.    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      Update  03/25/2021: He is onTysabri in November.[id number] Jaime [name]).  He is JCV Ab negative   Around the house he uses furniture for balance or a walker.   He has no recent falls.   He feels hands are strong but he has some clumsiness and tremor. He has some spasticity helped by baclofen.     He has ankle clonus when leg flexed backwards   He has some urinary dribble incontinence but no frank incontinence.   He has constipation.     He sleeps well most nights now.    He notes mild depression and anxiety.   He feels cognition is doing about the same as before the MS but he has some word finding slowness at times - better than earlir.    His great great uncle had MS.      He has medical issues include POTS, autism spectrum disorder and depression.   Sertraline was prescribed for mood and depression but he prefers not to take it.     They will be doing [employer] trip soon and I showed the mom a few web sites that go over accessibility issues.     MS history: He was diagnosed with MS March 2021 after presenting with poor balance, disorientation.  At the time, he was walking his dog and collapsed prompting a visit to the ED.   MRI's of the head and spine showed multiple lesions c/w MS.   In retrospect, he had some imbalance in February but was walking at his baseline January 2021.   Multiple brain and T-spine lesions enhanced.   He ws admitted and received 5 days of IV solu-medrol.   He improved but not to baseline.  He did outpatient rehab.  He never saw neurology as outpatient.    In August 2021, he began to have more difficulty with his gait and re-presented to the ED 05/19/2020.    It  showed numerous enhancing lesions and he was admitted for 5 more days of IV Solu-medrol.    He improved some but is not at baseline.   Imaging review: MRI brain 07/15/2020 shows multiple T2/FLAIR hyperintense foci in the pons, middle cerebral hemispheres, cerebellum, spinal cord, medulla, thalamus and in the periventricular, juxtacortical and deep white matter of the hemispheres.  At least a dozen foci in the hemispheres enhance after contrast consistent with acute demyelination.  These and some other foci are new compared to the 05/19/2020 MRI.   MRI of the cervical spine 07/15/2020 shows multiple T2 hyperintense foci in the spinal cord.  None of the foci enhance after contrast.  Some foci are new compared to the 12/11/2019 MRI.  MRI of the thoracic spine 07/15/2020 showed multiple T2 hyperintense foci in the thoracic spinal cord.  1 focus at T4 appears to be enhancing.  There are several new lesions compared to the 12/16/2019 MRI.  MRI of the brain 05/19/2020 showed multiple T2/FLAIR hyperintense foci in the periventricular, juxtacortical and deep white matter.  There are also foci in the cerebellum, left middle cerebellar peduncle and brainstem.  Multiple lesions enhance including some in  the hemispheres, cerebellum and middle cerebellar peduncle.  Foci that enhanced on the March 2021 MRI appear smaller and do not enhance   MRI of the brain  12/16/2019 shows Multiple T2/FLAIR hyperintense foci in the periventricular, juxtacortical and deep white matter of the hemispheres.  Additionally, foci are noted in the brainstem and cerebellum enhancement.   MRI of the brain 12/11/2019 shows multiple T2/FLAIR hyperintense foci in the periventricular, juxtacortical and deep white matter of the hemispheres.  Many of the periventricular foci are radially oriented to the ventricles.  Foci are noted in the deep gray matter, brainstem and cerebellar peduncles multiple lesions enhance.   MRI of the cervical spine 12/11/2019  shows extensive T2 hyperintense foci in the spinal cord the most prominent focus is posteriorly to the right at C2.  None of the foci enhanced.   MRI of the thoracic spine 12/11/2019 showed multiple T2 hyperintense foci within the spinal cord.  There was enhancement at T1-T2 and T10-T11.   MRI of the lumbar spine 12/11/2019 showed some demyelination in the distal spinal cord but no enhancement.  There is a small disc protrusion at L5-S1 that does not cause nerve root compression or spinal stenosis.   Laboratory tests: 06/19/2020: Hep B surface antibody positive.  Hep B surface antigen and hep C core antibody are negative.   QuantiFERON-TB is negative VZV IgG shows that he is not immune to varicella.   Labs done March 2021 admission: RPR negative, HIV negative, ACE normal.  NMO antibody was negative.  SARS-CoV-2 PCR was negative.   Cerebrospinal fluid December 12, 2019: IgG index 1.1 (this is very elevated consistent with MS, oligoclonal bands was not performed).  CSF was otherwise normal.   REVIEW OF SYSTEMS: Out of a complete 14 system review of symptoms, the patient complains only of the following symptoms, and all other reviewed systems are negative.   ALLERGIES: No Known Allergies   HOME MEDICATIONS: Facility-Administered Medications Prior to Visit  Medication Dose Route Frequency Provider Last Rate Last Admin   0.9 %  sodium chloride infusion  250 mL Intravenous PRN Chotiner, Yevonne Aline, MD       acetaminophen (TYLENOL) tablet 650 mg  650 mg Oral Q6H PRN Chotiner, Yevonne Aline, MD       Or   acetaminophen (TYLENOL) suppository 650 mg  650 mg Rectal Q6H PRN Chotiner, Yevonne Aline, MD       baclofen (LIORESAL) tablet 5 mg  5 mg Oral TID Swayze, Ava, DO   5 mg at 10/20/21 0919   docusate sodium (COLACE) capsule 100 mg  100 mg Oral TID PRN Swayze, Ava, DO       DULoxetine (CYMBALTA) DR capsule 60 mg  60 mg Oral Daily Swayze, Ava, DO   60 mg at 10/20/21 0919   enoxaparin (LOVENOX) injection 40 mg  40  mg Subcutaneous Q24H Swayze, Ava, DO   40 mg at 10/19/21 2115   methylPREDNISolone sodium succinate (SOLU-MEDROL) 1,000 mg in sodium chloride 0.9 % 50 mL IVPB  1,000 mg Intravenous Q24H Donnetta Simpers, MD 66 mL/hr at 10/20/21 1303 1,000 mg at 10/20/21 1303   And   pantoprazole (PROTONIX) injection 40 mg  40 mg Intravenous Q24H Donnetta Simpers, MD   40 mg at 10/20/21 1303   ondansetron (ZOFRAN) tablet 4 mg  4 mg Oral Daily PRN Swayze, Ava, DO       sodium chloride flush (NS) 0.9 % injection 3 mL  3 mL Intravenous Q12H Chotiner, Yevonne Aline,  MD   3 mL at 10/20/21 1000   sodium chloride flush (NS) 0.9 % injection 3 mL  3 mL Intravenous PRN Chotiner, Claudean Severance, MD       Outpatient Medications Prior to Visit  Medication Sig Dispense Refill   acetaminophen (TYLENOL) 500 MG tablet Take 1,000 mg by mouth every 6 (six) hours as needed for headache (pain).     Baclofen 5 MG TABS Take 5 mg by mouth 3 (three) times daily. 90 tablet 11   docusate sodium (COLACE) 100 MG capsule Take 1 capsule (100 mg total) by mouth 3 (three) times daily. (Patient taking differently: Take 100 mg by mouth 3 (three) times daily as needed (constipation).) 10 capsule 0   DULoxetine (CYMBALTA) 60 MG capsule Take 1 capsule (60 mg total) by mouth daily. (Patient taking differently: Take 60 mg by mouth daily as needed (depression/sadness (give zofran with each dose)).) 90 capsule 5   midodrine (PROAMATINE) 10 MG tablet Take 1 tablet (10 mg total) by mouth 2 (two) times daily with a meal. 60 tablet 11   ondansetron (ZOFRAN) 4 MG tablet Take 1 tablet (4 mg total) by mouth every 8 (eight) hours as needed for nausea or vomiting. (Patient taking differently: Take 4 mg by mouth daily as needed (with each dose of Cymbalta (duloxetine)).) 60 tablet 0     PAST MEDICAL HISTORY: Past Medical History:  Diagnosis Date   Autism    Depression    MS (multiple sclerosis) ([facility])      PAST SURGICAL HISTORY: Past Surgical History:   Procedure Laterality Date   NO PAST SURGERIES       FAMILY HISTORY: Family History  Problem Relation Age of Onset   Clotting disorder Mother      SOCIAL HISTORY: Social History   Socioeconomic History   Marital status: Single    Spouse name: Not on file   Number of children: Not on file   Years of education: Not on file   Highest education level: Not on file  Occupational History   Not on file  Tobacco Use   Smoking status: Never   Smokeless tobacco: Never  Vaping Use   Vaping Use: Never used  Substance and Sexual Activity   Alcohol use: No   Drug use: Never   Sexual activity: Not Currently  Other Topics Concern   Not on file  Social History Narrative   Right handed    Caffeine use: soda sometimes   Lives with family   Social Determinants of Health   Financial Resource Strain: Not on file  Food Insecurity: Not on file  Transportation Needs: Not on file  Physical Activity: Not on file  Stress: Not on file  Social Connections: Not on file  Intimate Partner Violence: Not on file     PHYSICAL EXAM  There were no vitals filed for this visit. There is no height or weight on file to calculate BMI.  Generalized: Well developed, in no acute distress  Cardiology: normal rate and rhythm, no murmur auscultated  Respiratory: clear to auscultation bilaterally    Neurological examination  Mentation: Alert oriented to time, place, history taking. Follows all commands speech and language fluent Cranial nerve II-XII: Pupils were equal round reactive to light. Extraocular movements were full, visual field were full on confrontational test. Facial sensation and strength were normal. Uvula tongue midline. Head turning and shoulder shrug  were normal and symmetric. Motor: The motor testing reveals 5 over 5 strength of all 4  extremities. Good symmetric motor tone is noted throughout.  Sensory: Sensory testing is intact to soft touch on all 4 extremities. No evidence of  extinction is noted.  Coordination: Cerebellar testing reveals good finger-nose-finger and heel-to-shin bilaterally.  Gait and station: Gait is normal. Tandem gait is normal. Romberg is negative. No drift is seen.  Reflexes: Deep tendon reflexes are symmetric and normal bilaterally.    DIAGNOSTIC DATA (LABS, IMAGING, TESTING) - I reviewed patient records, labs, notes, testing and imaging myself where available.  Lab Results  Component Value Date   WBC 9.7 10/19/2021   HGB 13.6 10/19/2021   HCT 40.5 10/19/2021   MCV 90.2 10/19/2021   PLT 200 10/19/2021      Component Value Date/Time   NA 140 10/19/2021 0407   K 3.5 10/19/2021 0407   CL 109 10/19/2021 0407   CO2 25 10/19/2021 0407   GLUCOSE 97 10/19/2021 0407   BUN 14 10/19/2021 0407   CREATININE 0.83 10/19/2021 0407   CALCIUM 8.5 (L) 10/19/2021 0407   PROT 7.5 10/18/2021 2024   ALBUMIN 4.6 10/18/2021 2024   ALBUMIN 4.6 12/12/2019 0002   AST 21 10/18/2021 2024   ALT 17 10/18/2021 2024   ALKPHOS 51 10/18/2021 2024   BILITOT 1.0 10/18/2021 2024   GFRNONAA >60 10/19/2021 0407   GFRAA >60 06/02/2020 0516   No results found for: CHOL, HDL, LDLCALC, LDLDIRECT, TRIG, CHOLHDL Lab Results  Component Value Date   HGBA1C 5.3 12/14/2019   Lab Results  Component Value Date   [id number] 12/11/2019   No results found for: TSH  No flowsheet data found.   No flowsheet data found.   ASSESSMENT AND PLAN  26 y.o. year old male  has a past medical history of Autism, Depression, and MS (multiple sclerosis) ([place]). here with    No diagnosis found.   No orders of the defined types were placed in this encounter.    No orders of the defined types were placed in this encounter.     Debbora Presto, MSN, FNP-C 10/20/2021, 4:37 PM  [facility] [address], [place], [place] [address] [phone]

## 2021-10-20 NOTE — Progress Notes (Signed)
PROGRESS NOTE  Jaime [name] [id number] DOB: 04-13-1996 DOA: 10/18/2021 PCP: Katherina Mires, MD  Brief History   Jaime [name] is a 26 y.o. male with medical history significant for MS, austism spectrum disorder who presented for evaluation of possible MS flare up.  Mother was in the emergency room initially and reported to the ER PA that patient has been having increasing weakness over the last 1 to 2 weeks with 2 to 3 days of slurred speech and a mild drooping of the left side of his face.  Mother reported that has had the symptoms in the past when he had an multiple sclerosis flare.  Reportedly he has walked due to severe weakness in his right leg and would have to be helped and carried from place to place in the house by family members.  Patient states that he has had weakness in his legs and he had a few falls at home but did not have any head injury or loss of consciousness.  No family is at the bedside at this time.  Reports that the weakness started in the right leg but now involves the both legs.  Patient is able to speak now and speech is understandable and not slurred.  Patient reports that it has been slurred for the last week but was worse for the last 2 to 3 days but is getting better he thinks.  Difficult to get a history from him secondary to autism spectrum disorder. Reportedly has been compliant with his medications and did receive anatalizumab/Tysabri infusion 3 days ago. Lives with family.  No tobacco alcohol or illicit drug use.   ED Course: Jaime [name] has been hemodynamically stable and afebrile in the emergency room.  CT of the head was negative for acute intracranial pathology.  Chest x-ray is negative for infiltrate or consolidation.  Lab work reveals WBC of 12,600 hemoglobin 13.3 hematocrit 44.9 platelets 232,000, CK4 47, sodium 138 potassium 3.2 chloride 107 bicarb 24 creatinine 0.68 BUN 13 glucose 126 magnesium 1.9 LFTs normal.  Urinalysis is pending.  COVID swab  pending.  Case was discussed with neurology who recommended MRI of the brain and C-spine with and without contrast to evaluate for MS exacerbation and if positive neurology will be officially consulted and patient be started on high-dose IV steroids.  MRI cannot be obtained tonight at [facility] but neurology stated he could wait until the morning and no need to transfer patient to [facility] at this time.  MRI Brain and C-spine has demonstrated extensive multiple sclerosis with multiple new lesions in the brain and spinal cord. There wer at least 9 enhancing supratentorial lesions consistent with active demyelination, There wer no definite enhancing lesions in the C-spine. Neurology has been consulted and the patient has been placed on high dose steroids.  Speech sounds a little clearer this morning than yesterday. Patient himself does not notice improvement in speech or strength.  Consultants  Neurology  Procedures  None  Antibiotics   Anti-infectives (From admission, onward)    None      Subjective  The patient is resting comfortably. No new complaints.  Objective   Vitals:  Vitals:   10/20/21 0944 10/20/21 1324  BP: 127/79 125/81  Pulse: 92 (!) 108  Resp: 18 18  Temp: 99 F (37.2 C) 98.9 F (37.2 C)  SpO2: 97%     Exam:  Constitutional:  The patient is awake, alert, and oriented x 3. No acute distress. Respiratory:  No  increased work of breathing. No wheezes, rales, or rhonchi No tactile fremitus Cardiovascular:  Regular rate and rhythm No murmurs, ectopy, or gallups. No lateral PMI. No thrills. Abdomen:  Abdomen is soft, non-tender, non-distended No hernias, masses, or organomegaly Normoactive bowel sounds.  Musculoskeletal:  No cyanosis, clubbing, or edema Skin:  No rashes, lesions, ulcers palpation of skin: no induration or nodules Neurologic:  CN 2-12 intact Sensation all 4 extremities intact Psychiatric:  Mental status Mood, affect  appropriate Orientation to person, place, time  judgment and insight appear intact  I have personally reviewed the following:   Today's Data  Vitals  Lab Data  BMP CBC  Micro Data    Imaging  CXR : No active cardiopulmonary disease MRI Brain and C-Spine  Cardiology Data  EKG  Other Data    Scheduled Meds:  baclofen  5 mg Oral TID   DULoxetine  60 mg Oral Daily   enoxaparin (LOVENOX) injection  40 mg Subcutaneous Q24H   pantoprazole (PROTONIX) IV  40 mg Intravenous Q24H   sodium chloride flush  3 mL Intravenous Q12H   Continuous Infusions:  sodium chloride     methylPREDNISolone (SOLU-MEDROL) injection 1,000 mg (10/20/21 1303)    Principal Problem:   Multiple sclerosis (HCC) Active Problems:   Lower extremity weakness   Multiple sclerosis exacerbation (HCC)   Autism spectrum disorder   Hypokalemia   Slurred speech   LOS: 1 day   A & P  Acute Exacerbation/Flare of MS: MRI Brain and C-Spine demonstrated: MRI Brain and C-spine has demonstrated extensive multiple sclerosis with multiple new lesions in the brain and spinal cord. There wer at least 9 enhancing supratentorial lesions consistent with active demyelination, There wer no definite enhancing lesions in the C-spine. Neurology has been consulted and the patient has been placed on high dose steroids. Speech sounds a little clearer this morning than yesterday. Patient himself does not notice improvement in speech or strength.  Lower Extremity Weakness: Due to MS exacerbation and Flare. Patient is on High dose steroid regimen. Neurology is consulted. PT/OT when appropriate.  Slurred Speech: Due to MS exacerbation. Will have speech evaluate for swallow.   Hypokalemia: Supplement and monitor.  Autism Spectrum Disorder: Noted.  I have seen and examined this patient myself. I have spent 32 minutes in his evaluation and care.  DVT Prophylaxis: Lovenox CODE STATUS: Full Code Family Communication: None  available Disposition: tbd.  Jaime Knee, DO [facility] Direct contact: see [email or web address]  7PM-7AM contact night coverage as above 10/20/2021, 3:5 PM  LOS: 0 days

## 2021-10-20 NOTE — Progress Notes (Signed)
°  Transition of Care [facility]) Screening Note   Patient Details  Name: Jaime [name] Date of Birth: 1995/10/26   Transition of Care [facility]) CM/SW Contact:    Amada Jupiter, LCSW Phone Number: 10/20/2021, 10:04 AM    Transition of Care Department [facility]) has reviewed patient and no TOC needs have been identified at this time. We will continue to monitor patient advancement through interdisciplinary progression rounds. If new patient transition needs arise, please place a TOC consult.

## 2021-10-21 LAB — CBC WITH DIFFERENTIAL/PLATELET
Abs Immature Granulocytes: 0.04 10*3/uL (ref 0.00–0.07)
Basophils Absolute: 0 10*3/uL (ref 0.0–0.1)
Basophils Relative: 0 %
Eosinophils Absolute: 0 10*3/uL (ref 0.0–0.5)
Eosinophils Relative: 0 %
HCT: 47.9 % (ref 39.0–52.0)
Hemoglobin: 16.3 g/dL (ref 13.0–17.0)
Immature Granulocytes: 0 %
Lymphocytes Relative: 12 %
Lymphs Abs: 1.5 10*3/uL (ref 0.7–4.0)
MCH: 30.3 pg (ref 26.0–34.0)
MCHC: 34 g/dL (ref 30.0–36.0)
MCV: 89 fL (ref 80.0–100.0)
Monocytes Absolute: 0.2 10*3/uL (ref 0.1–1.0)
Monocytes Relative: 2 %
Neutro Abs: 10.6 10*3/uL — ABNORMAL HIGH (ref 1.7–7.7)
Neutrophils Relative %: 86 %
Platelets: 241 10*3/uL (ref 150–400)
RBC: 5.38 MIL/uL (ref 4.22–5.81)
RDW: 13.5 % (ref 11.5–15.5)
WBC: 12.4 10*3/uL — ABNORMAL HIGH (ref 4.0–10.5)
nRBC: 0.4 % — ABNORMAL HIGH (ref 0.0–0.2)

## 2021-10-21 LAB — BASIC METABOLIC PANEL
Anion gap: 7 (ref 5–15)
BUN: 16 mg/dL (ref 6–20)
CO2: 23 mmol/L (ref 22–32)
Calcium: 9.6 mg/dL (ref 8.9–10.3)
Chloride: 102 mmol/L (ref 98–111)
Creatinine, Ser: 0.87 mg/dL (ref 0.61–1.24)
GFR, Estimated: >60 mL/min (ref >60)
Glucose, Bld: 134 mg/dL — ABNORMAL HIGH (ref 70–99)
Potassium: 4.3 mmol/L (ref 3.5–5.1)
Sodium: 132 mmol/L — ABNORMAL LOW (ref 135–145)

## 2021-10-21 LAB — GLUCOSE, CAPILLARY
Glucose-Capillary: 116 mg/dL — ABNORMAL HIGH (ref 70–99)
Glucose-Capillary: 153 mg/dL — ABNORMAL HIGH (ref 70–99)

## 2021-10-21 NOTE — Plan of Care (Signed)

## 2021-10-21 NOTE — Progress Notes (Signed)
? ?  Inpatient Rehab Admissions Coordinator : ? ?Per therapy recommendations, patient was screened for CIR candidacy by Deshannon Hinchliffe RN MSN.  At this time patient appears to be a potential candidate for CIR. I will place a rehab consult per protocol for full assessment. Please call me with any questions. ? ?Tiffancy Moger RN MSN ?Admissions Coordinator ?[phone] ?  ?

## 2021-10-21 NOTE — Progress Notes (Signed)
Jaime [name]  [id number] DOB: 01-25-1996 DOA: 10/18/2021 PCP: Katherina Mires, MD    Brief Narrative:  26 year old with a history of MS, spasticity, hypotension, and autism who presented to the ED 10/18/2021 with increasing weakness over a 1 to 2-week.  Accompanied by slurred speech and mild drooping of the left side of his face thought to represent an MS flare.  Significant Events:  Following admission MRI of the brain and C-spine was accomplished which noted extensive multiple sclerosis with multiple new lesions in the brain and spinal cord.  Neurology was consulted and the patient was placed on high-dose steroid therapy.  Consultants:  Neurology  Code Status: FULL CODE  Antimicrobials:  None  DVT prophylaxis: Lovenox  Interim Hx: Afebrile.  Vital signs stable.  Saturation 98% room air.  Mild hyponatremia appreciated.  Resting comfortably in bed.  Has not noted significant improvement in his presenting symptoms thus far. Is alert conversant and pleasant.  Assessment & Plan:  Acute flare of multiple sclerosis Demonstrated on MRI brain and C-spine -on high-dose steroid therapy as recommended by neurology for a 5 day course - cont GI prophylaxis - follow CBGs  Slurred speech Due to above - monitor trend   Hypokalemia Corrected with supplementation   Family Communication: No family present at time of exam Disposition: From home -discharge disposition will depend upon improvement with treatment of MS flare  Objective: Blood pressure 116/76, pulse 62, temperature 98.7 F (37.1 C), resp. rate 16, height 5\' 9"  (1.753 m), weight 51.3 kg, SpO2 98 %.  Intake/Output Summary (Last 24 hours) at 10/21/2021 0932 Last data filed at 10/21/2021 0600 Gross per 24 hour  Intake 960 ml  Output 1000 ml  Net -40 ml   Filed Weights   10/18/21 2016  Weight: 51.3 kg    Examination: General: No acute respiratory distress Lungs: Clear to auscultation bilaterally without wheezes or  crackles Cardiovascular: Regular rate and rhythm without murmur gallop or rub normal S1 and S2 Abdomen: Nontender, nondistended, soft, bowel sounds positive, no rebound, no ascites, no appreciable mass Extremities: No significant cyanosis, clubbing, or edema bilateral lower extremities  CBC: Recent Labs  Lab 10/18/21 2024 10/19/21 0407 10/21/21 0358  WBC 12.6* 9.7 12.4*  NEUTROABS 8.0*  --  10.6*  HGB 15.3 13.6 16.3  HCT 44.9 40.5 47.9  MCV 89.6 90.2 89.0  PLT 232 200 [id number]   Basic Metabolic Panel: Recent Labs  Lab 10/18/21 2024 10/18/21 2207 10/19/21 0407 10/21/21 0358  NA 138  --  140 132*  K 3.2*  --  3.5 4.3  CL 107  --  109 102  CO2 24  --  25 23  GLUCOSE 126*  --  97 134*  BUN 13  --  14 16  CREATININE 0.68  --  0.83 0.87  CALCIUM 9.2  --  8.5* 9.6  MG  --  1.9  --   --    GFR: Estimated Creatinine Clearance: 94.2 mL/min (by C-G formula based on SCr of 0.87 mg/dL).  Liver Function Tests: Recent Labs  Lab 10/18/21 2024  AST 21  ALT 17  ALKPHOS 51  BILITOT 1.0  PROT 7.5  ALBUMIN 4.6    Cardiac Enzymes: Recent Labs  Lab 10/18/21 2024  CKTOTAL 447*    HbA1C: Hgb A1c MFr Bld  Date/Time Value Ref Range Status  12/14/2019 06:19 AM 5.3 4.8 - 5.6 % Final    Comment:    (NOTE) Pre diabetes:  5.7%-6.4% Diabetes:              >6.4% Glycemic control for   <7.0% adults with diabetes     Recent Results (from the past 240 hour(s))  Resp Panel by RT-PCR (Flu A&B, Covid) Nasopharyngeal Swab     Status: None   Collection Time: 10/19/21 12:42 AM   Specimen: Nasopharyngeal Swab; Nasopharyngeal(NP) swabs in vial transport medium  Result Value Ref Range Status   SARS Coronavirus 2 by RT PCR NEGATIVE NEGATIVE Final    Comment: (NOTE) SARS-CoV-2 target nucleic acids are NOT DETECTED.  The SARS-CoV-2 RNA is generally detectable in upper respiratory specimens during the acute phase of infection. The lowest concentration of SARS-CoV-2 viral copies this  assay can detect is 138 copies/mL. A negative result does not preclude SARS-Cov-2 infection and should not be used as the sole basis for treatment or other patient management decisions. A negative result may occur with  improper specimen collection/handling, submission of specimen other than nasopharyngeal swab, presence of viral mutation(s) within the areas targeted by this assay, and inadequate number of viral copies(<138 copies/mL). A negative result must be combined with clinical observations, patient history, and epidemiological information. The expected result is Negative.  Fact Sheet for Patients:  EntrepreneurPulse.com.au  Fact Sheet for Healthcare Providers:  IncredibleEmployment.be  This test is no t yet approved or cleared by the [place] FDA and  has been authorized for detection and/or diagnosis of SARS-CoV-2 by FDA under an Emergency Use Authorization (EUA). This EUA will remain  in effect (meaning this test can be used) for the duration of the COVID-19 declaration under Section 564(b)(1) of the Act, 21 U.S.C.section 360bbb-3(b)(1), unless the authorization is terminated  or revoked sooner.       Influenza A by PCR NEGATIVE NEGATIVE Final   Influenza B by PCR NEGATIVE NEGATIVE Final    Comment: (NOTE) The Xpert Xpress SARS-CoV-2/FLU/RSV plus assay is intended as an aid in the diagnosis of influenza from Nasopharyngeal swab specimens and should not be used as a sole basis for treatment. Nasal washings and aspirates are unacceptable for Xpert Xpress SARS-CoV-2/FLU/RSV testing.  Fact Sheet for Patients: EntrepreneurPulse.com.au  Fact Sheet for Healthcare Providers: IncredibleEmployment.be  This test is not yet approved or cleared by the [place] FDA and has been authorized for detection and/or diagnosis of SARS-CoV-2 by FDA under an Emergency Use Authorization (EUA). This EUA will  remain in effect (meaning this test can be used) for the duration of the COVID-19 declaration under Section 564(b)(1) of the Act, 21 U.S.C. section 360bbb-3(b)(1), unless the authorization is terminated or revoked.  Performed at [facility], [place] [address] [place], [place] [address]      Scheduled Meds:  baclofen  5 mg Oral TID   DULoxetine  60 mg Oral Daily   enoxaparin (LOVENOX) injection  40 mg Subcutaneous Q24H   pantoprazole (PROTONIX) IV  40 mg Intravenous Q24H   sodium chloride flush  3 mL Intravenous Q12H   Continuous Infusions:  sodium chloride     methylPREDNISolone (SOLU-MEDROL) injection 1,000 mg (10/20/21 1303)     LOS: 2 days   Cherene Altes, MD [facility] Office  [phone] Pager - Text Page per Shea Evans  If 7PM-7AM, please contact night-coverage per Amion 10/21/2021, 9:32 AM

## 2021-10-21 NOTE — Plan of Care (Signed)

## 2021-10-21 NOTE — Evaluation (Signed)
Occupational Therapy Evaluation Patient Details Name: Jaime [name] MRN: [id number] DOB: April 20, 1996 Today's Date: 10/21/2021   History of Present Illness Patient is a 26 year old male who presented with 2-3 day history of slurred speech and mild L side facial droop. patient was fount to have MS flare. PMH: MS, austism spectrum disorder   Clinical Impression   Patient is a 26 year old male who was noted to been admitted for above. Currently, patient is noted to have increased ataxic movements of BLE and BUE requiring +2 to safely transfer from edge of bed to recliner in room with increased time to process cues.  Patient was able to indicate some PLOF as noted in chart from one year prior. Patient was noted to have decreased functional activity tolerance, decreased standing balance, decreased endurance, increased sataxic movement and decreased safety awareness impacting participation in ADLs. Patient would continue to benefit from skilled OT services at this time while admitted and after d/c to address noted deficits in order to improve overall safety and independence in ADLs.       Recommendations for follow up therapy are one component of a multi-disciplinary discharge planning process, led by the attending physician.  Recommendations may be updated based on patient status, additional functional criteria and insurance authorization.   Follow Up Recommendations  Acute inpatient rehab (3hours/day)    Assistance Recommended at Discharge Frequent or constant Supervision/Assistance  Patient can return home with the following Two people to help with walking and/or transfers;Two people to help with bathing/dressing/bathroom;Direct supervision/assist for medications management;Help with stairs or ramp for entrance;Assist for transportation;Assistance with cooking/housework;Direct supervision/assist for financial management    Functional Status Assessment  Patient has had a recent decline in their  functional status and demonstrates the ability to make significant improvements in function in a reasonable and predictable amount of time.  Equipment Recommendations  None recommended by OT    Recommendations for Other Services       Precautions / Restrictions Precautions Precautions: Fall Precaution Comments: ataxia of BUE and BLE Restrictions Weight Bearing Restrictions: No      Mobility Bed Mobility Overal bed mobility: Needs Assistance Bed Mobility: Supine to Sit     Supine to sit: Mod assist     General bed mobility comments: patient needed physical assistance to move BLE off edge of bed and bring trunk into midline.    Transfers                          Balance Overall balance assessment: Needs assistance Sitting-balance support: Bilateral upper extremity supported, Feet supported Sitting balance-Leahy Scale: Fair     Standing balance support: Reliant on assistive device for balance, Bilateral upper extremity supported Standing balance-Leahy Scale: Poor                             ADL either performed or assessed with clinical judgement   ADL Overall ADL's : Needs assistance/impaired Eating/Feeding: Supervision/ safety;Sitting Eating/Feeding Details (indicate cue type and reason): in recliner Grooming: Wash/dry face;Sitting;Minimal assistance   Upper Body Bathing: Moderate assistance;Sitting   Lower Body Bathing: Sit to/from stand;Sitting/lateral leans;Maximal assistance   Upper Body Dressing : Sitting;Moderate assistance   Lower Body Dressing: Sit to/from stand;Sitting/lateral leans;Total assistance   Toilet Transfer: +2 for physical assistance;+2 for safety/equipment;Moderate assistance Toilet Transfer Details (indicate cue type and reason): patient simulated transfer to recliner with RW use. patient was noted  to take large step forwards then when corrected changed to large step towards this Probation officer. patient was able to complete  transfer with increased time and +2 for safety. Toileting- Clothing Manipulation and Hygiene: Total assistance;Sit to/from stand       Functional mobility during ADLs: +2 for safety/equipment;+2 for physical assistance       Vision Baseline Vision/History: 1 Wears glasses Ability to See in Adequate Light: 1 Impaired Patient Visual Report: No change from baseline       Perception     Praxis      Pertinent Vitals/Pain Pain Assessment Pain Assessment: No/denies pain     Hand Dominance Right   Extremity/Trunk Assessment Upper Extremity Assessment Upper Extremity Assessment: Generalized weakness;RUE deficits/detail;LUE deficits/detail RUE Deficits / Details: noted to have more ataxic movements on this UE with attemtps at finger to nose touch. unable to hold one UE up at same time for test so threapist hand was used. RUE Coordination: decreased fine motor;decreased gross motor LUE Deficits / Details: noted to have some ataxic movements but noted to be able to touch finger to nose test easier using therapist hand.   Lower Extremity Assessment Lower Extremity Assessment: Defer to PT evaluation   Cervical / Trunk Assessment Cervical / Trunk Assessment: Normal   Communication Communication Communication: No difficulties   Cognition Arousal/Alertness: Awake/alert Behavior During Therapy: WFL for tasks assessed/performed Overall Cognitive Status: Within Functional Limits for tasks assessed                                 General Comments: has history of autism.     General Comments       Exercises     Shoulder Instructions      Home Living Family/patient expects to be discharged to:: Private residence Living Arrangements: Parent Available Help at Discharge: Family;Available 24 hours/day Type of Home: Apartment Home Access: Stairs to enter [employer] of Steps: 4 with rail in front and 2 without rail in back Entrance Stairs-Rails: Can reach  both Home Layout: Two level;Bed/bath upstairs Alternate Level Stairs-Number of Steps: 6-8 Alternate Level Stairs-Rails: Right Bathroom Shower/Tub: Occupational psychologist: Standard     Home Equipment: Conservation officer, nature (2 wheels)          Prior Functioning/Environment Prior Level of Function : Needs assist                        OT Problem List: Decreased activity tolerance;Impaired balance (sitting and/or standing);Decreased safety awareness;Decreased knowledge of precautions      OT Treatment/Interventions: Self-care/ADL training;Therapeutic exercise;Energy conservation;DME and/or AE instruction;Therapeutic activities;Balance training;Patient/family education    OT Goals(Current goals can be found in the care plan section) Acute Rehab OT Goals Patient Stated Goal: to get to recliner OT Goal Formulation: With patient Time For Goal Achievement: 11/04/21 Potential to Achieve Goals: Good  OT Frequency: Min 2X/week    Co-evaluation PT/OT/SLP Co-Evaluation/Treatment: Yes Reason for Co-Treatment: To address functional/ADL transfers;For patient/therapist safety PT goals addressed during session: Mobility/safety with mobility OT goals addressed during session: ADL's and self-care      AM-PAC OT "6 Clicks" Daily Activity     Outcome Measure Help from another person eating meals?: A Little Help from another person taking care of personal grooming?: A Little Help from another person toileting, which includes using toliet, bedpan, or urinal?: A Lot Help from another person bathing (including washing, rinsing,  drying)?: A Lot Help from another person to put on and taking off regular upper body clothing?: A Lot Help from another person to put on and taking off regular lower body clothing?: A Lot 6 Click Score: 14   End of Session Nurse Communication: Mobility status  Activity Tolerance: Patient tolerated treatment well Patient left: in chair;with call bell/phone  within reach;with chair alarm set;with nursing/sitter in room  OT Visit Diagnosis: Unsteadiness on feet (R26.81);Ataxia, unspecified (R27.0);Other abnormalities of gait and mobility (R26.89);Muscle weakness (generalized) (M62.81)                Time: 1340-1400 OT Time Calculation (min): 20 min Charges:  OT General Charges $OT Visit: 1 Visit OT Evaluation $OT Eval Moderate Complexity: 1 Mod  Jackelyn Poling OTR/L, MS Acute Rehabilitation Department Office# [phone] Pager# [phone]   Marcellina Millin 10/21/2021, 2:17 PM

## 2021-10-21 NOTE — Evaluation (Signed)
Physical Therapy Evaluation Patient Details Name: Jaime [name] MRN: [id number] DOB: Mar 05, 1996 Today's Date: 10/21/2021  History of Present Illness  Patient is a 26 year old male who presented with 2-3 day history of slurred speech and mild L side facial droop. patient was fount to have MS flare. PMH: MS, austism spectrum disorder  Clinical Impression  The patient presents with significant tone  , ataxia, Right extremities > left. Patient with strong extensor tone RLE when mobilizing to sitting. Noted patient actively placed the  left leg over right ankle to flex /break the tone after sitting down . Patient required max of 2  to stand and attempt steps, very ataxic.  Patient indicates that he was ambulatory, Unsure how long ago. Family not present.  Patient will benefit from CIR. Pt admitted with above diagnosis.  Pt currently with functional limitations due to the deficits listed below (see PT Problem List). Pt will benefit from skilled PT to increase their independence and safety with mobility to allow discharge to the venue listed below.        Recommendations for follow up therapy are one component of a multi-disciplinary discharge planning process, led by the attending physician.  Recommendations may be updated based on patient status, additional functional criteria and insurance authorization.  Follow Up Recommendations Acute inpatient rehab (3hours/day)    Assistance Recommended at Discharge Frequent or constant Supervision/Assistance  Patient can return home with the following  Two people to help with walking and/or transfers;Assistance with cooking/housework;A lot of help with bathing/dressing/bathroom;Direct supervision/assist for medications management;Assist for transportation;Help with stairs or ramp for entrance    Equipment Recommendations Wheelchair (measurements PT);Wheelchair cushion (measurements PT)  Recommendations for Other Services  Rehab consult    Functional  Status Assessment Patient has had a recent decline in their functional status and demonstrates the ability to make significant improvements in function in a reasonable and predictable amount of time.     Precautions / Restrictions Precautions Precautions: Fall Precaution Comments: ataxia of BUE and BLE ,right > left Restrictions Weight Bearing Restrictions: No      Mobility  Bed Mobility   Bed Mobility: Supine to Sit     Supine to sit: Mod assist     General bed mobility comments: patient needed physical assistance to move BLE off edge of bed and bring trunk into midline.Noted increased extensor tone of the right leg, Once seated, right knee in extension. Patient used left leg to push the right leg back into flexion.    Transfers Overall transfer level: Needs assistance Equipment used: Rolling walker (2 wheels) Transfers: Sit to/from Stand, Bed to chair/wheelchair/BSC Sit to Stand: Max assist, +2 physical assistance, +2 safety/equipment           General transfer comment: Right leg abducted when taking a step trying to move to the left to recliner, knees flexed when standing, ataxic movements , more on right. poor control for stepping.    Ambulation/Gait                  Stairs            Wheelchair Mobility    Modified Rankin (Stroke Patients Only)       Balance   Sitting-balance support: Bilateral upper extremity supported, Feet supported Sitting balance-Leahy Scale: Fair   Postural control: Posterior lean Standing balance support: Reliant on assistive device for balance, Bilateral upper extremity supported Standing balance-Leahy Scale: Poor Standing balance comment: braced legs against the bed.Knees gflexed first standing  trial. 2nd trial, knees more extended, mostly hyperextended on right.                             Pertinent Vitals/Pain Pain Assessment Pain Assessment: No/denies pain    Home Living Family/patient expects to  be discharged to:: Private residence Living Arrangements: Parent Available Help at Discharge: Family;Available 24 hours/day Type of Home: Apartment Home Access: Stairs to enter Entrance Stairs-Rails: Can reach both Entrance Stairs-Number of Steps: 4 with rail in front and 2 without rail in back Alternate Level Stairs-Number of Steps: 6-8 Home Layout: Two level;Bed/bath upstairs Home Equipment: Agricultural consultant (2 wheels)      Prior Function Prior Level of Function : Needs assist                     Hand Dominance   Dominant Hand: Right    Extremity/Trunk Assessment   Upper Extremity Assessment Upper Extremity Assessment: Defer to OT evaluation RUE Deficits / Details: noted to have more ataxic movements on this UE with attemtps at finger to nose touch. unable to hold one UE up at same time for test so threapist hand was used. RUE Coordination: decreased fine motor;decreased gross motor LUE Deficits / Details: noted to have some ataxic movements but noted to be able to touch finger to nose test easier using therapist hand.    Lower Extremity Assessment Lower Extremity Assessment: LLE deficits/detail;RLE deficits/detail RLE Deficits / Details: strong increased extensor tone with movement, plantar flexed ankle, patient breaks  the tone with left leg by pushing back, patient not clear about the sensation, appreciates LT, ataxic movemnts, uncontrolled when attempting taking a step to recliner LLE Deficits / Details: ataxic . Less tone than the right  decreased control, patient reports some  decreased feeling, not specific    Cervical / Trunk Assessment Cervical / Trunk Assessment: Other exceptions Cervical / Trunk Exceptions: tends to lean posterior when sitting.  Communication   Communication:  (answers with simple words)  Cognition                                                General Comments      Exercises     Assessment/Plan    PT Assessment  Patient needs continued PT services  PT Problem List Decreased balance;Impaired tone;Decreased mobility;Decreased activity tolerance;Decreased safety awareness;Impaired sensation;Decreased coordination       PT Treatment Interventions DME instruction;Therapeutic activities;Gait training;Therapeutic exercise;Patient/family education;Balance training;Functional mobility training;Neuromuscular re-education    PT Goals (Current goals can be found in the Care Plan section)  Acute Rehab PT Goals Patient Stated Goal: patient did not stae PT Goal Formulation: With patient Time For Goal Achievement: 11/04/21 Potential to Achieve Goals: Good    Frequency Min 3X/week     Co-evaluation PT/OT/SLP Co-Evaluation/Treatment: Yes Reason for Co-Treatment: Complexity of the patient's impairments (multi-system involvement);For patient/therapist safety;To address functional/ADL transfers PT goals addressed during session: Mobility/safety with mobility OT goals addressed during session: ADL's and self-care       AM-PAC PT "6 Clicks" Mobility  Outcome Measure Help needed turning from your back to your side while in a flat bed without using bedrails?: A Lot Help needed moving from lying on your back to sitting on the side of a flat bed without using bedrails?: A Lot Help  needed moving to and from a bed to a chair (including a wheelchair)?: A Lot Help needed standing up from a chair using your arms (e.g., wheelchair or bedside chair)?: Total Help needed to walk in hospital room?: Total Help needed climbing 3-5 steps with a railing? : Total 6 Click Score: 2    End of Session Equipment Utilized During Treatment: Gait belt Activity Tolerance: Patient tolerated treatment well Patient left: in chair;with call bell/phone within reach;with chair alarm set Nurse Communication: Mobility status PT Visit Diagnosis: Unsteadiness on feet (R26.81);Other symptoms and signs involving the nervous system (R29.898)     Time: 1340-1405 PT Time Calculation (min) (ACUTE ONLY): 25 min   Charges:   PT Evaluation $PT Eval Low Complexity: 1 Low          Blanchard Kelch PT Acute Rehabilitation Services Pager [phone] Office [phone]   Rada Hay 10/21/2021, 4:22 PM

## 2021-10-22 ENCOUNTER — Encounter: Payer: Self-pay | Admitting: Family Medicine

## 2021-10-22 ENCOUNTER — Ambulatory Visit: Payer: Medicaid Other | Admitting: Family Medicine

## 2021-10-22 LAB — GLUCOSE, CAPILLARY
Glucose-Capillary: 181 mg/dL — ABNORMAL HIGH (ref 70–99)
Glucose-Capillary: 144 mg/dL — ABNORMAL HIGH (ref 70–99)
Glucose-Capillary: 165 mg/dL — ABNORMAL HIGH (ref 70–99)

## 2021-10-22 NOTE — Progress Notes (Signed)
Jaime [name]  KOE:[id number] DOB: 1996/09/04 DOA: 10/18/2021 PCP: Macy Mis, MD    Brief Narrative:  25yo with a history of MS, spasticity, hypotension, and autism who presented to the ED 10/18/2021 with increasing weakness over 1-2 weeks accompanied by slurred speech and mild drooping of the left side of his face thought to represent an MS flare.  Significant Events:  Following admission MRI of the brain and C-spine was accomplished which noted extensive multiple sclerosis with multiple new lesions in the brain and spinal cord.  Neurology was consulted and the patient was placed on high-dose steroid therapy.  Consultants:  Neurology  Code Status: FULL CODE  Antimicrobials:  None  DVT prophylaxis: Lovenox  Interim Hx: No acute events reported overnight.  OT and PT have seen the patient and both recommending acute inpatient rehab.  Afebrile.  Vital signs stable.  Saturation 95% room air.  Resting comfortably in bed.  Speech more clear today.  Patient not sure if he feels stronger or not today.  No new complaints.  Assessment & Plan:  Acute flare of multiple sclerosis Demonstrated on MRI brain and C-spine -on high-dose steroid therapy as recommended by Neurology for a 5 day course - cont GI prophylaxis - following CBGs  Slurred speech Due to above - monitor trend   Hypokalemia Corrected with supplementation   Family Communication: No family present at time of exam Disposition: From home - possible CIR once steroid course completed (Friday is last dose)  Objective: Blood pressure (!) 105/52, pulse 77, temperature 98.6 F (37 C), resp. rate 14, height 5\' 9"  (1.753 m), weight 51.3 kg, SpO2 95 %.  Intake/Output Summary (Last 24 hours) at 10/22/2021 0924 Last data filed at 10/22/2021 0600 Gross per 24 hour  Intake 1192 ml  Output 500 ml  Net 692 ml    Filed Weights   10/18/21 2016  Weight: 51.3 kg    Examination: General: No acute respiratory distress Lungs:  Clear to auscultation bilaterally  Cardiovascular: RRR without murmur or rub Abdomen: Nontender, nondistended, soft, bowel sounds positive Extremities: No significant cyanosis, clubbing, or edema bilateral lower extremities  CBC: Recent Labs  Lab 10/18/21 2024 10/19/21 0407 10/21/21 0358  WBC 12.6* 9.7 12.4*  NEUTROABS 8.0*  --  10.6*  HGB 15.3 13.6 16.3  HCT 44.9 40.5 47.9  MCV 89.6 90.2 89.0  PLT 232 200 241    Basic Metabolic Panel: Recent Labs  Lab 10/18/21 2024 10/18/21 2207 10/19/21 0407 10/21/21 0358  NA 138  --  140 132*  K 3.2*  --  3.5 4.3  CL 107  --  109 102  CO2 24  --  25 23  GLUCOSE 126*  --  97 134*  BUN 13  --  14 16  CREATININE 0.68  --  0.83 0.87  CALCIUM 9.2  --  8.5* 9.6  MG  --  1.9  --   --     GFR: Estimated Creatinine Clearance: 94.2 mL/min (by C-G formula based on SCr of 0.87 mg/dL).  Liver Function Tests: Recent Labs  Lab 10/18/21 2024  AST 21  ALT 17  ALKPHOS 51  BILITOT 1.0  PROT 7.5  ALBUMIN 4.6     Cardiac Enzymes: Recent Labs  Lab 10/18/21 2024  CKTOTAL 447*     HbA1C: Hgb A1c MFr Bld  Date/Time Value Ref Range Status  12/14/2019 06:19 AM 5.3 4.8 - 5.6 % Final    Comment:    (NOTE) Pre diabetes:  5.7%-6.4% Diabetes:              >6.4% Glycemic control for   <7.0% adults with diabetes     Scheduled Meds:  baclofen  5 mg Oral TID   DULoxetine  60 mg Oral Daily   enoxaparin (LOVENOX) injection  40 mg Subcutaneous Q24H   pantoprazole (PROTONIX) IV  40 mg Intravenous Q24H   sodium chloride flush  3 mL Intravenous Q12H   Continuous Infusions:  sodium chloride     methylPREDNISolone (SOLU-MEDROL) injection 1,000 mg (10/21/21 1116)     LOS: 3 days   Cherene Altes, MD [facility] Office  [phone] Pager - Text Page per Shea Evans  If 7PM-7AM, please contact night-coverage per Amion 10/22/2021, 9:24 AM

## 2021-10-23 LAB — BASIC METABOLIC PANEL
Anion gap: 7 (ref 5–15)
BUN: 21 mg/dL — ABNORMAL HIGH (ref 6–20)
CO2: 24 mmol/L (ref 22–32)
Calcium: 8.7 mg/dL — ABNORMAL LOW (ref 8.9–10.3)
Chloride: 106 mmol/L (ref 98–111)
Creatinine, Ser: 0.92 mg/dL (ref 0.61–1.24)
GFR, Estimated: >60 mL/min (ref >60)
Glucose, Bld: 135 mg/dL — ABNORMAL HIGH (ref 70–99)
Potassium: 4.2 mmol/L (ref 3.5–5.1)
Sodium: 137 mmol/L (ref 135–145)

## 2021-10-23 LAB — GLUCOSE, CAPILLARY
Glucose-Capillary: 127 mg/dL — ABNORMAL HIGH (ref 70–99)
Glucose-Capillary: 130 mg/dL — ABNORMAL HIGH (ref 70–99)
Glucose-Capillary: 91 mg/dL (ref 70–99)

## 2021-10-23 NOTE — Progress Notes (Addendum)
Occupational Therapy Treatment Patient Details Name: Jaime [name] MRN: [id number] DOB: August 17, 1996 Today's Date: 10/23/2021   History of present illness Patient is a 26 year old male who presented with 2-3 day history of slurred speech and mild L side facial droop. Patient was fount to have MS flare. PMH: MS, austism spectrum disorder.  Pt reports POTTS   OT comments  Chart reviewed to date, pt seen for a co-tx with PT on this date. Pt greeted in bed, agreeable to tx session. Tx session targeted progressing functional mobility, ADL tasks to facilitate return to PLOF. Pt appears to be progressing towards goal acquisition, notable increased tolerance for all ADL and mobility activity on this date. Pt is left in bedside chair, NAD, all needs met. OT will continue to follow.    Recommendations for follow up therapy are one component of a multi-disciplinary discharge planning process, led by the attending physician.  Recommendations may be updated based on patient status, additional functional criteria and insurance authorization.    Follow Up Recommendations  Acute inpatient rehab (3hours/day)    Assistance Recommended at Discharge Frequent or constant Supervision/Assistance  Patient can return home with the following  Two people to help with walking and/or transfers;Two people to help with bathing/dressing/bathroom;Direct supervision/assist for medications management;Help with stairs or ramp for entrance;Assist for transportation;Assistance with cooking/housework;Direct supervision/assist for financial management   Equipment Recommendations  None recommended by OT (per next venue of care)    Recommendations for Other Services      Precautions / Restrictions Precautions Precautions: Fall Precaution Comments: Montior BP /HR       Mobility Bed Mobility Overal bed mobility: Needs Assistance Bed Mobility: Supine to Sit     Supine to sit: Supervision          Transfers Overall  transfer level: Needs assistance Equipment used: Rolling walker (2 wheels) Transfers: Sit to/from Stand Sit to Stand: Min assist, +2 safety/equipment                 Balance Overall balance assessment: Needs assistance Sitting-balance support: Feet supported, No upper extremity supported Sitting balance-Leahy Scale: Good   Postural control: Posterior lean Standing balance support: Reliant on assistive device for balance, Bilateral upper extremity supported Standing balance-Leahy Scale: Poor                             ADL either performed or assessed with clinical judgement   ADL Overall ADL's : Needs assistance/impaired Eating/Feeding: Supervision/ safety;Sitting   Grooming: Wash/dry face;Wash/dry hands;Applying deodorant;Sitting;Set up;Minimal assistance   Upper Body Bathing: Minimal assistance;Sitting       Upper Body Dressing : Moderate assistance;Sitting       Toilet Transfer: Minimal assistance;+2 for physical assistance Toilet Transfer Details (indicate cue type and reason): simulated to bedside chair         Functional mobility during ADLs: Min guard;Minimal assistance;+2 for physical assistance (+2 at start, progressed to +1)      Extremity/Trunk Assessment              Vision       Perception     Praxis      Cognition Arousal/Alertness: Awake/alert Behavior During Therapy: WFL for tasks assessed/performed Overall Cognitive Status: Within Functional Limits for tasks assessed  Exercises      Shoulder Instructions       General Comments Pt with history of POTTS per report and wears abdominal binder at home. PT sent secure date to MD regarding order for abd binder. Vitals closely monitored as follows: Supine 140/80  and HR 85   Sitting 117/90 and HR 106    Sitting 3 mins 125/84 and HR 95    Stood and tx to chair:125/85 and HR 85   Post walk 141/92 and HR 93     Pertinent Vitals/ Pain       Pain Assessment Pain Assessment: No/denies pain  Home Living                                          Prior Functioning/Environment              Frequency  Min 2X/week        Progress Toward Goals  OT Goals(current goals can now be found in the care plan section)  Progress towards OT goals: Progressing toward goals  Acute Rehab OT Goals Patient Stated Goal: to feel better OT Goal Formulation: With patient Time For Goal Achievement: 11/06/21 Potential to Achieve Goals: Good  Plan Discharge plan remains appropriate    Co-evaluation    PT/OT/SLP Co-Evaluation/Treatment: Yes Reason for Co-Treatment: Complexity of the patient's impairments (multi-system involvement);For patient/therapist safety;To address functional/ADL transfers PT goals addressed during session: Mobility/safety with mobility OT goals addressed during session: ADL's and self-care      AM-PAC OT "6 Clicks" Daily Activity     Outcome Measure   Help from another person eating meals?: A Little Help from another person taking care of personal grooming?: A Little Help from another person toileting, which includes using toliet, bedpan, or urinal?: A Lot Help from another person bathing (including washing, rinsing, drying)?: A Lot Help from another person to put on and taking off regular upper body clothing?: A Little Help from another person to put on and taking off regular lower body clothing?: A Lot 6 Click Score: 15    End of Session Equipment Utilized During Treatment: Gait belt;Rolling walker (2 wheels)  OT Visit Diagnosis: Unsteadiness on feet (R26.81);Ataxia, unspecified (R27.0);Other abnormalities of gait and mobility (R26.89);Muscle weakness (generalized) (M62.81)   Activity Tolerance Patient tolerated treatment well   Patient Left in chair;with call bell/phone within reach;with chair alarm set;with family/visitor present   Nurse  Communication Mobility status        Time: 4920-1007 OT Time Calculation (min): 31 min  Charges: OT General Charges $OT Visit: 1 Visit OT Treatments $Self Care/Home Management : 8-22 mins  Shanon Payor, OTD OTR/L  10/23/21, 3:33 PM

## 2021-10-23 NOTE — Progress Notes (Signed)
NEUROLOGY CONSULTATION PROGRESS NOTE   Date of service: October 23, 2021 Patient Name: SANTHOSH GULINO MRN:  [id number] DOB:  [birth year]   Interval Hx   Weakness and coordination is improved.  Vitals   Vitals:   10/22/21 1309 10/22/21 2153 10/23/21 0538 10/23/21 1231  BP: 135/82 124/86 122/66 119/90  Pulse: 73 66 64 96  Resp: 16 17 18 18   Temp: 98.3 F (36.8 C) 98 F (36.7 C) 98 F (36.7 C) 97.8 F (36.6 C)  TempSrc: Oral Oral Oral Oral  SpO2: 96% 97% 99% 99%  Weight:      Height:         Body mass index is 16.69 kg/m.  Physical Exam   General: Laying comfortably in bed; in no acute distress.  HENT: Normal oropharynx and mucosa. Normal external appearance of ears and nose.  Neck: Supple, no pain or tenderness  CV: No JVD. No peripheral edema.  Pulmonary: Symmetric Chest rise. Normal respiratory effort.  Abdomen: Soft to touch, non-tender.  Ext: No cyanosis, edema, or deformity  Skin: No rash. Normal palpation of skin.   Musculoskeletal: Normal digits and nails by inspection. No clubbing.   Neurologic Examination  Mental status/Cognition: Alert, oriented to self, place, month and year, good attention.  Speech/language: Fluent, comprehension intact, object naming intact, repetition intact.   Motor:  Muscle bulk: poor, tone mildly increased.  Mvmt Root Nerve  Muscle Right Left Comments  SA C5/6 Ax Deltoid     EF C5/6 Mc Biceps     EE C6/7/8 Rad Triceps     WF C6/7 Med FCR     WE C7/8 PIN ECU     F Ab C8/T1 U ADM/FDI 5 5   HF L1/2/3 Fem Illopsoas     KE L2/3/4 Fem Quad     DF L4/5 D Peron Tib Ant 4+ 4+   PF S1/2 Tibial Grc/Sol 4+ 4+    Reflexes:  Right Left Comments  Pectoralis      Biceps (C5/6)     Brachioradialis (C5/6)      Triceps (C6/7)      Patellar (L3/4) 3 3    Achilles (S1) 4 4 Spontaneous clonus BL   Hoffman      Plantar     Jaw jerk    Sensation:  Light touch Intact throughout   Pin prick    Temperature    Vibration    Proprioception    Coordination/Complex Motor:  - Finger to Nose ataxia improved - Heel to shin ataxia improved. - Rapid alternating movement are slowed - Gait: defered.  Labs   Basic Metabolic Panel:  Lab Results  Component Value Date   NA 137 10/23/2021   K 4.2 10/23/2021   CO2 24 10/23/2021   GLUCOSE 135 (H) 10/23/2021   BUN 21 (H) 10/23/2021   CREATININE 0.92 10/23/2021   CALCIUM 8.7 (L) 10/23/2021   GFRNONAA >60 10/23/2021   GFRAA >60 06/02/2020   HbA1c:  Lab Results  Component Value Date   HGBA1C 5.3 12/14/2019   LDL: No results found for: [facility] Urine Drug Screen: No results found for: LABOPIA, COCAINSCRNUR, LABBENZ, AMPHETMU, THCU, LABBARB  Alcohol Level No results found for: ETH No results found for: PHENYTOIN, ZONISAMIDE, LAMOTRIGINE, LEVETIRACETA No results found for: PHENYTOIN, PHENOBARB, VALPROATE, CBMZ  Imaging and Diagnostic studies    MRI brain and Cspine:  1. Extensive multiple sclerosis with multiple new lesions in the brain and spinal cord. 2. At least 9 enhancing supratentorial lesions  consistent with active demyelination. 3. No definite enhancing lesions in the cervical spine.   Impression   HAMZA EMPSON is a 26 y.o. male with a history of MS who presents with LE weakness, worse on the right with accompanied left facial droop and slurred speech which has resolved. He has not been incontinent or had vision changes. His imaging is consistent with new enhancing lesions of active demyelination.   Recommendations  - he is s/p 5 doses of IVMP and his weakness is improved and coordination is improved. - Would benefit from CIR. - Follow up with Dr. Despina Arias with [facility]. - We will signoff. Please feel free to contact us with any questions or concerns. ______________________________________________________________________   Thank you for the opportunity to take part in the care of this patient. If you have any further  questions, please contact the neurology consultation attending.  Signed,  Erick Blinks [facility] Pager Number [phone]

## 2021-10-23 NOTE — Progress Notes (Signed)
Physical Therapy Treatment Patient Details Name: Jaime [name] MRN: [id number] DOB: 01-22-1996 Today's Date: 10/23/2021   History of Present Illness Patient is a 26 year old male who presented with 2-3 day history of slurred speech and mild L side facial droop. Patient was fount to have MS flare. PMH: MS, austism spectrum disorder.  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  He required mod cues with ambulation and a chair follow for safety.  Pt reports hx of POTTS and wearing abd binder at home (asked for order), but VSS during session.  Pt with improvements in strength, coordination, and tone but still with significant deficits and below his baseline.  Continue to recommend inpt rehab.     Recommendations for follow up therapy are one component of a multi-disciplinary discharge planning process, led by the attending physician.  Recommendations may be updated based on patient status, additional functional criteria and insurance authorization.  Follow Up Recommendations  Acute inpatient rehab (3hours/day)     Assistance Recommended at Discharge Frequent or constant Supervision/Assistance  Patient can return home with the following A lot of help with walking and/or transfers;A lot of help with bathing/dressing/bathroom;Assistance with cooking/housework   Equipment Recommendations  Rolling walker (2 wheels);Other (comment) (further assessment post acute)    Recommendations for Other Services       Precautions / Restrictions Precautions Precautions: Fall Precaution Comments: Montior BP /HR     Mobility  Bed Mobility Overal bed mobility: Needs Assistance Bed Mobility: Supine to Sit     Supine to sit: Supervision          Transfers Overall transfer level: Needs assistance Equipment used: Rolling walker (2 wheels) Transfers: Sit to/from Stand Sit to Stand: Min assist, +2 safety/equipment           General transfer  comment: Performed x 2 during session; light min A to steady; vitals monitored (see comments)    Ambulation/Gait Ambulation/Gait assistance: Min assist Gait Distance (Feet): 100 Feet Assistive device: Rolling walker (2 wheels) Gait Pattern/deviations: Step-through pattern, Decreased stride length, Narrow base of support, Ataxic, Scissoring Gait velocity: decreased     General Gait Details: Pt with tendency to circumduct, scissor, and ataxia on R side.  Improved with mod cues for increased BOS.  Also, with tendency to get too close to RW - corrected with cues.  Min A to steady and control RW; mod Cues   Stairs             Wheelchair Mobility    Modified Rankin (Stroke Patients Only)       Balance Overall balance assessment: Needs assistance Sitting-balance support: Feet supported, No upper extremity supported Sitting balance-Leahy Scale: Good     Standing balance support: Reliant on assistive device for balance, Bilateral upper extremity supported Standing balance-Leahy Scale: Poor Standing balance comment: Using RW and min A at times                            Cognition Arousal/Alertness: Awake/alert Behavior During Therapy: WFL for tasks assessed/performed Overall Cognitive Status: Within Functional Limits for tasks assessed                                 General Comments: has history of autism.        Exercises  General Comments General comments (skin integrity, edema, etc.): Pt reports hx of POTTS and wears abdominal binder at home.  Sent secure chat to MD and RW requesting order for abd binder.  Proceeded with tx with vitals closely monitored and slow transitions.   Vitals as follows:  Supine 140/80  and HR 85  Sitting 117/90 and HR 106   Sitting 3 mins 125/84 and HR 95   Stood and tx to chair:125/85 and HR 85  Post walk 141/92 and HR 93       Pertinent Vitals/Pain Pain Assessment Pain Assessment: No/denies pain     Home Living                          Prior Function            PT Goals (current goals can now be found in the care plan section) Acute Rehab PT Goals Time For Goal Achievement: 11/06/21 Progress towards PT goals: Goals met and updated - see care plan    Frequency    Min 3X/week      PT Plan Current plan remains appropriate    Co-evaluation PT/OT/SLP Co-Evaluation/Treatment: Yes Reason for Co-Treatment: Complexity of the patient's impairments (multi-system involvement);For patient/therapist safety (based on prior tx and complexity) PT goals addressed during session: Mobility/safety with mobility OT goals addressed during session: ADL's and self-care      AM-PAC PT "6 Clicks" Mobility   Outcome Measure  Help needed turning from your back to your side while in a flat bed without using bedrails?: A Little Help needed moving from lying on your back to sitting on the side of a flat bed without using bedrails?: A Little Help needed moving to and from a bed to a chair (including a wheelchair)?: A Little Help needed standing up from a chair using your arms (e.g., wheelchair or bedside chair)?: A Little Help needed to walk in hospital room?: A Lot Help needed climbing 3-5 steps with a railing? : Total 6 Click Score: 15    End of Session Equipment Utilized During Treatment: Gait belt Activity Tolerance: Patient tolerated treatment well Patient left: in chair;with call bell/phone within reach;with chair alarm set;with family/visitor present Nurse Communication: Mobility status PT Visit Diagnosis: Unsteadiness on feet (R26.81);Other symptoms and signs involving the nervous system (R29.898)     Time: 9628-3662 PT Time Calculation (min) (ACUTE ONLY): 34 min  Charges:  $Gait Training: 8-22 mins                     Abran Richard, PT Acute Rehab Services Pager [phone] [facility] [place] 10/23/2021, 2:43 PM

## 2021-10-23 NOTE — Progress Notes (Signed)
Inpatient Rehab Admissions Coordinator:   Met with patient and his family at the bedside.  Pt has been on CIR several times in the past 2/2 MS exacerbations.  They are familiar with goals/expectations and prepared to continue to provide assist at home.  Verified Medicaid eligibility.    I have a bed for this pt to admit to CIR on Saturday (1/21).  Dr. Thereasa Solo in agreement.  Rehab MD (Dr. Ranell Patrick) to assess pt and confirm admission on Saturday.  Floor RN can call CIR at [phone] for report after 12pm on Saturday.  I have let pt/family and case manager know.  [facility]) will arrange transport in the AM.  Please contact her with questions.   Shann Medal, PT, DPT Admissions Coordinator [phone] 10/23/21  2:52 PM

## 2021-10-23 NOTE — Progress Notes (Signed)
Jaime [name]  RXV:[id number] DOB: [birth year] DOA: 10/18/2021 PCP: Macy Mis, MD    Brief Narrative:  25yo with a history of MS, spasticity, hypotension, and autism who presented to the ED 10/18/2021 with increasing weakness over 1-2 weeks accompanied by slurred speech and mild drooping of the left side of his face thought to represent an MS flare.  Significant Events:  Following admission MRI of the brain and C-spine was accomplished which noted extensive multiple sclerosis with multiple new lesions in the brain and spinal cord.  Neurology was consulted and the patient was placed on high-dose steroid therapy.  Consultants:  Neurology  Code Status: FULL CODE  Antimicrobials:  None  DVT prophylaxis: Lovenox  Interim Hx: Afebrile.  Vital signs stable.  Saturation 99% on room air.  Electrolytes balanced.  Renal function normal.  CBG controlled.  Due for his last dose of Solu-Medrol at noon today.  No new complaints.  Sitting up in chair at time of my visit.  Appears to be stronger overall but remains quite weak in general and clearly would benefit from aggressive physical and occupational therapy.  Speech appears to have returned to baseline.  Assessment & Plan:  Acute flare of multiple sclerosis Demonstrated on MRI brain and C-spine -on high-dose steroid therapy as recommended by Neurology for a 5 day course - cont GI prophylaxis - following CBGs  Slurred speech Due to above - monitor trend   Hypokalemia Corrected with supplementation   Family Communication: No family present at time of exam Disposition: From home - medically stable for transfer to CIR when bed available  Objective: Blood pressure 122/66, pulse 64, temperature 98 F (36.7 C), temperature source Oral, resp. rate 18, height 5\' 9"  (1.753 m), weight 51.3 kg, SpO2 99 %.  Intake/Output Summary (Last 24 hours) at 10/23/2021 0846 Last data filed at 10/23/2021 0600 Gross per 24 hour  Intake 906 ml  Output 0  ml  Net 906 ml    Filed Weights   10/18/21 2016  Weight: 51.3 kg    Examination: General: No acute respiratory distress Lungs: Clear to auscultation bilaterally  Cardiovascular: RRR without murmur or rub Abdomen: Nontender, nondistended, soft, bowel sounds positive Extremities: No edema bilateral lower extremities  CBC: Recent Labs  Lab 10/18/21 2024 10/19/21 0407 10/21/21 0358  WBC 12.6* 9.7 12.4*  NEUTROABS 8.0*  --  10.6*  HGB 15.3 13.6 16.3  HCT 44.9 40.5 47.9  MCV 89.6 90.2 89.0  PLT 232 200 241    Basic Metabolic Panel: Recent Labs  Lab 10/18/21 2207 10/19/21 0407 10/21/21 0358 10/23/21 0346  NA  --  140 132* 137  K  --  3.5 4.3 4.2  CL  --  109 102 106  CO2  --  25 23 24   GLUCOSE  --  97 134* 135*  BUN  --  14 16 21*  CREATININE  --  0.83 0.87 0.92  CALCIUM  --  8.5* 9.6 8.7*  MG 1.9  --   --   --     GFR: Estimated Creatinine Clearance: 89.1 mL/min (by C-G formula based on SCr of 0.92 mg/dL).  Liver Function Tests: Recent Labs  Lab 10/18/21 2024  AST 21  ALT 17  ALKPHOS 51  BILITOT 1.0  PROT 7.5  ALBUMIN 4.6     Cardiac Enzymes: Recent Labs  Lab 10/18/21 2024  CKTOTAL 447*     HbA1C: Hgb A1c MFr Bld  Date/Time Value Ref Range Status  12/14/2019  06:19 AM 5.3 4.8 - 5.6 % Final    Comment:    (NOTE) Pre diabetes:          5.7%-6.4% Diabetes:              >6.4% Glycemic control for   <7.0% adults with diabetes     Scheduled Meds:  baclofen  5 mg Oral TID   DULoxetine  60 mg Oral Daily   enoxaparin (LOVENOX) injection  40 mg Subcutaneous Q24H   pantoprazole (PROTONIX) IV  40 mg Intravenous Q24H   sodium chloride flush  3 mL Intravenous Q12H   Continuous Infusions:  sodium chloride     methylPREDNISolone (SOLU-MEDROL) injection 1,000 mg (10/22/21 1111)     LOS: 4 days   Lonia Blood, MD [facility] Office  [phone] Pager - Text Page per Loretha Stapler  If 7PM-7AM, please contact night-coverage per  Amion 10/23/2021, 8:46 AM

## 2021-10-23 NOTE — H&P (Incomplete)
Physical Medicine and Rehabilitation Admission H&P    Chief Complaint  Patient presents with   MS flare up  CC:  Functional deficits due to acute flare MS  HPI: Jaime [name] is a 26 year old male who presented to [facility] emergency department on 10/18/2021 with increasing bilateral lower extremity weakness preceding the previous 1 to 2 weeks.  This was accompanied by slurred speech and mild drooping of the left side of his face.  He has a history of multiple sclerosis and this was thought to be an MS flare.  As part of his admission work-up, MRI of the brain and C-spine were completed which noted extensive multiple sclerosis with multiple new demyelinating lesions in the brain and spinal cord.  Neurology was consulted and the patient was placed on high-dose steroid therapy.  CT head unremarkable. On presentation, he reported multiple falls without injuries.  He denied upper extremity weakness, voiding difficulty or vision changes.  The patient is followed as an outpatient by Dr. Felecia Shelling and has received Tysabri (anatalizumab) infusions.  His last infusion was Tysabri 300 mg on 10/15/2021.  The patient at that time endorsed increased weakness in his right leg for approximately 1 week.  A follow-up appoint with Dr. Felecia Shelling was arranged.  There are medical conditions include depression maintained on Cymbalta.  He has a history of autism spectrum disorder.  Lives at home with family and his mother accompanied him to emergency department.  He denies alcohol, tobacco or illicit drug use.   ROS Past Medical History:  Diagnosis Date   Autism    Depression    MS (multiple sclerosis) ([place])    Past Surgical History:  Procedure Laterality Date   NO PAST SURGERIES     Family History  Problem Relation Age of Onset   Clotting disorder Mother    Social History:  reports that he has never smoked. He has never used smokeless tobacco. He reports that he does not drink alcohol and does not  use drugs. Allergies: No Known Allergies Medications Prior to Admission  Medication Sig Dispense Refill   acetaminophen (TYLENOL) 500 MG tablet Take 1,000 mg by mouth every 6 (six) hours as needed for headache (pain).     Baclofen 5 MG TABS Take 5 mg by mouth 3 (three) times daily. 90 tablet 11   docusate sodium (COLACE) 100 MG capsule Take 1 capsule (100 mg total) by mouth 3 (three) times daily. (Patient taking differently: Take 100 mg by mouth 3 (three) times daily as needed (constipation).) 10 capsule 0   DULoxetine (CYMBALTA) 60 MG capsule Take 1 capsule (60 mg total) by mouth daily. (Patient taking differently: Take 60 mg by mouth daily as needed (depression/sadness (give zofran with each dose)).) 90 capsule 5   midodrine (PROAMATINE) 10 MG tablet Take 1 tablet (10 mg total) by mouth 2 (two) times daily with a meal. 60 tablet 11   ondansetron (ZOFRAN) 4 MG tablet Take 1 tablet (4 mg total) by mouth every 8 (eight) hours as needed for nausea or vomiting. (Patient taking differently: Take 4 mg by mouth daily as needed (with each dose of Cymbalta (duloxetine)).) 60 tablet 0    Drug Regimen Review  Drug regimen was reviewed and remains appropriate with no significant issues identified  Home: Home Living Family/patient expects to be discharged to:: Private residence Living Arrangements: Parent Available Help at Discharge: Family, Available 24 hours/day Type of Home: Apartment Home Access: Stairs to enter [employer] of Steps: 4  with rail in front and 2 without rail in back Entrance Stairs-Rails: Can reach both Home Layout: Two level, Bed/bath upstairs Alternate Level Stairs-Number of Steps: 6-8 Alternate Level Stairs-Rails: Right Bathroom Shower/Tub: Multimedia programmer: Standard Home Equipment: Conservation officer, nature (2 wheels)   Functional History: Prior Function Prior Level of Function : Needs assist  Functional Status:  Mobility: Bed Mobility Overal bed  mobility: Needs Assistance Bed Mobility: Supine to Sit Supine to sit: Mod assist General bed mobility comments: patient needed physical assistance to move BLE off edge of bed and bring trunk into midline.Noted increased extensor tone of the right leg, Once seated, right knee in extension. Patient used left leg to push the right leg back into flexion. Transfers Overall transfer level: Needs assistance Equipment used: Rolling walker (2 wheels) Transfers: Sit to/from Stand, Bed to chair/wheelchair/BSC Sit to Stand: Max assist, +2 physical assistance, +2 safety/equipment Bed to/from chair/wheelchair/BSC transfer type:: Step pivot General transfer comment: Right leg abducted when taking a step trying to move to the left to recliner, knees flexed when standing, ataxic movements , more on right. poor control for stepping.      ADL: ADL Overall ADL's : Needs assistance/impaired Eating/Feeding: Supervision/ safety, Sitting Eating/Feeding Details (indicate cue type and reason): in recliner Grooming: Wash/dry face, Sitting, Minimal assistance Upper Body Bathing: Moderate assistance, Sitting Lower Body Bathing: Sit to/from stand, Sitting/lateral leans, Maximal assistance Upper Body Dressing : Sitting, Moderate assistance Lower Body Dressing: Sit to/from stand, Sitting/lateral leans, Total assistance Toilet Transfer: +2 for physical assistance, +2 for safety/equipment, Moderate assistance Toilet Transfer Details (indicate cue type and reason): patient simulated transfer to recliner with RW use. patient was noted to take large step forwards then when corrected changed to large step towards this Probation officer. patient was able to complete transfer with increased time and +2 for safety. Toileting- Clothing Manipulation and Hygiene: Total assistance, Sit to/from stand Functional mobility during ADLs: +2 for safety/equipment, +2 for physical assistance  Cognition: Cognition Overall Cognitive Status: Within  Functional Limits for tasks assessed Orientation Level: Oriented X4 Cognition Arousal/Alertness: Awake/alert Behavior During Therapy: WFL for tasks assessed/performed Overall Cognitive Status: Within Functional Limits for tasks assessed General Comments: has history of autism.  Physical Exam: Blood pressure 119/90, pulse 96, temperature 97.8 F (36.6 C), temperature source Oral, resp. rate 18, height 5\' 9"  (1.753 m), weight 51.3 kg, SpO2 99 %. Physical Exam  Results for orders placed or performed during the hospital encounter of 10/18/21 (from the past 48 hour(s))  Glucose, capillary     Status: Abnormal   Collection Time: 10/21/21  5:49 PM  Result Value Ref Range   Glucose-Capillary 153 (H) 70 - 99 mg/dL    Comment: Glucose reference range applies only to samples taken after fasting for at least 8 hours.  Glucose, capillary     Status: Abnormal   Collection Time: 10/22/21  1:04 AM  Result Value Ref Range   Glucose-Capillary 181 (H) 70 - 99 mg/dL    Comment: Glucose reference range applies only to samples taken after fasting for at least 8 hours.  Glucose, capillary     Status: Abnormal   Collection Time: 10/22/21  6:08 AM  Result Value Ref Range   Glucose-Capillary 144 (H) 70 - 99 mg/dL    Comment: Glucose reference range applies only to samples taken after fasting for at least 8 hours.  Glucose, capillary     Status: Abnormal   Collection Time: 10/22/21 12:31 PM  Result Value Ref Range  Glucose-Capillary 165 (H) 70 - 99 mg/dL    Comment: Glucose reference range applies only to samples taken after fasting for at least 8 hours.  Glucose, capillary     Status: Abnormal   Collection Time: 10/23/21 12:40 AM  Result Value Ref Range   Glucose-Capillary 127 (H) 70 - 99 mg/dL    Comment: Glucose reference range applies only to samples taken after fasting for at least 8 hours.  Basic metabolic panel     Status: Abnormal   Collection Time: 10/23/21  3:46 AM  Result Value Ref Range    Sodium 137 135 - 145 mmol/L   Potassium 4.2 3.5 - 5.1 mmol/L   Chloride 106 98 - 111 mmol/L   CO2 24 22 - 32 mmol/L   Glucose, Bld 135 (H) 70 - 99 mg/dL    Comment: Glucose reference range applies only to samples taken after fasting for at least 8 hours.   BUN 21 (H) 6 - 20 mg/dL   Creatinine, Ser 0.92 0.61 - 1.24 mg/dL   Calcium 8.7 (L) 8.9 - 10.3 mg/dL   GFR, Estimated >60 >60 mL/min    Comment: (NOTE) Calculated using the CKD-EPI Creatinine Equation (2021)    Anion gap 7 5 - 15    Comment: Performed at [facility], [place] [address], [place], [place] [address]  Glucose, capillary     Status: Abnormal   Collection Time: 10/23/21  5:40 AM  Result Value Ref Range   Glucose-Capillary 130 (H) 70 - 99 mg/dL    Comment: Glucose reference range applies only to samples taken after fasting for at least 8 hours.   No results found.     Medical Problem List and Plan: 1. Functional deficits secondary to multiple sclerosis flare and lower extremity weakness  -patient may *** shower  -ELOS/Goals: *** 2.  Antithrombotics: -DVT/anticoagulation:  Pharmaceutical: Lovenox  -antiplatelet therapy: None 3. Pain Management: Tylenol, baclofen as needed 4. Mood: LCSW to evaluate and provide emotional support. Continue Cymbalta  -antipsychotic agents: n/a 5. Neuropsych: This patient *** capable of making decisions on *** own behalf. 6. Skin/Wound Care: Routine skin care checks 7. Fluids/Electrolytes/Nutrition: Routine Is and Os and follow-up chemistries 8: Autism spectrum disorder 9: Depression: Continue Cymbalta 10:   ***  Barbie Banner, PA-C 10/23/2021

## 2021-10-23 NOTE — PMR Pre-admission (Signed)
PMR Admission Coordinator Pre-Admission Assessment  Patient: Jaime [name] is an 26 y.o., male MRN: [id number] DOB: [birth year] Height: '5\' 9"'  (175.3 cm) Weight: 51.3 kg  Insurance Information HMO:     PPO:      PCP:      IPA:      80/20:      OTHER:  PRIMARY: Medicaid [place] Access      Policy#: [id number]      Subscriber: pt CM Name:       Phone#:      Fax#:  Pre-Cert#: verified on the phone      Employer:  Benefits:  Phone #:      Name:  Eff. Date: active as of 10/23/21     Deduct: $0      Out of Pocket Max: $0      Life Max:  CIR:       SNF:  Outpatient:      Co-Pay:  Home Health:       Co-Pay:  DME:      Co-Pay:  Providers:  SECONDARY:       Policy#:      Phone#:   Development worker, community:       Phone#:   The Actuary for patients in Inpatient Rehabilitation Facilities with attached Privacy Act [place] Records was provided and verbally reviewed with:   Emergency Contact Information Contact Information     Name Relation Home Work Mobile   Clinton Mother [phone]  [phone]   Aziel, Morgan Father   [phone]   Joshoa, Shawler   [phone]       Current Medical History  Patient Admitting Diagnosis: MS exacerbation  History of Present Illness: Jaime [name] is a 26 year old male who presented to [facility] emergency department on 10/18/2021 with increasing bilateral lower extremity weakness preceding the previous 1 to 2 weeks.  This was accompanied by slurred speech and mild drooping of the left side of his face.  He has a history of multiple sclerosis and this was thought to be an MS flare.  As part of his admission work-up, MRI of the brain and C-spine were completed which noted extensive multiple sclerosis with multiple new demyelinating lesions in the brain and spinal cord.  Neurology was consulted and the patient was placed on high-dose steroid therapy.  CT head unremarkable. On presentation, he  reported multiple falls without injuries.  He denied upper extremity weakness, voiding difficulty or vision changes.  The patient is followed as an outpatient by Dr. Felecia Shelling, and has received Tysabri (anatalizumab) infusions.  His last infusion was Tysabri 300 mg on 10/15/2021.  The patient at that time endorsed increased weakness in his right leg for approximately 1 week.  A follow-up appoint with Dr. Felecia Shelling was arranged.  There are medical conditions include depression maintained on Cymbalta.  He has a history of autism spectrum disorder. He denies alcohol, tobacco or illicit drug use.  Therapy evaluations completed and pt was recommended for CIR.     Patient's medical record from [facility] has been reviewed by the rehabilitation admission coordinator and physician.  Past Medical History  Past Medical History:  Diagnosis Date   Autism    Depression    MS (multiple sclerosis) ([place])     Has the patient had major surgery during 100 days prior to admission? No  Family History   family history includes Clotting disorder in his mother.  Current Medications  Current Facility-Administered Medications:    0.9 %  sodium chloride infusion, 250 mL, Intravenous, PRN, Chotiner, Yevonne Aline, MD, Last Rate: 10 mL/hr at 10/23/21 1430, 50 mL at 10/23/21 1430   acetaminophen (TYLENOL) tablet 650 mg, 650 mg, Oral, Q6H PRN **OR** [DISCONTINUED] acetaminophen (TYLENOL) suppository 650 mg, 650 mg, Rectal, Q6H PRN, Chotiner, Yevonne Aline, MD   baclofen (LIORESAL) tablet 5 mg, 5 mg, Oral, TID, Swayze, Ava, DO, 5 mg at 10/23/21 1003   docusate sodium (COLACE) capsule 100 mg, 100 mg, Oral, TID PRN, Swayze, Ava, DO, 100 mg at 10/22/21 1459   DULoxetine (CYMBALTA) DR capsule 60 mg, 60 mg, Oral, Daily, Swayze, Ava, DO, 60 mg at 10/23/21 1003   enoxaparin (LOVENOX) injection 40 mg, 40 mg, Subcutaneous, Q24H, Swayze, Ava, DO, 40 mg at 10/22/21 2142   methylPREDNISolone sodium succinate (SOLU-MEDROL) 1,000 mg in sodium  chloride 0.9 % 50 mL IVPB, 1,000 mg, Intravenous, Q24H, Last Rate: 66 mL/hr at 10/23/21 1431, 1,000 mg at 10/23/21 1431 **AND** [COMPLETED] pantoprazole (PROTONIX) injection 40 mg, 40 mg, Intravenous, Q24H, Donnetta Simpers, MD, 40 mg at 10/23/21 1213   ondansetron (ZOFRAN) tablet 4 mg, 4 mg, Oral, Daily PRN, Swayze, Ava, DO   sodium chloride flush (NS) 0.9 % injection 3 mL, 3 mL, Intravenous, Q12H, Chotiner, Yevonne Aline, MD, 3 mL at 10/23/21 1046   sodium chloride flush (NS) 0.9 % injection 3 mL, 3 mL, Intravenous, PRN, Chotiner, Yevonne Aline, MD  Patients Current Diet:  Diet Order             Diet regular Room service appropriate? Yes; Fluid consistency: Thin  Diet effective now                   Precautions / Restrictions Precautions Precautions: Fall Precaution Comments: [place] BP /HR Restrictions Weight Bearing Restrictions: No   Has the patient had 2 or more falls or a fall with injury in the past year? Yes  Prior Activity Level Limited Community (1-2x/wk): went out for appointments, doesn't drive, occasional use of DME if needed; does not need assist for basic mobility and ADLs, does need assist for IADLs  Prior Functional Level Self Care: Did the patient need help bathing, dressing, using the toilet or eating? Independent  Indoor Mobility: Did the patient need assistance with walking from room to room (with or without device)? Independent  Stairs: Did the patient need assistance with internal or external stairs (with or without device)? Independent  Functional Cognition: Did the patient need help planning regular tasks such as shopping or remembering to take medications? Needed some help  Patient Information Are you of Hispanic, Latino/a,or Spanish origin?: A. No, not of Hispanic, Latino/a, or Spanish origin What is your race?: B. Black or African American Do you need or want an interpreter to communicate with a doctor or health care staff?: 0. No  Patient's Response  To:  Health Literacy and Transportation Is the patient able to respond to health literacy and transportation needs?: Yes Health Literacy - How often do you need to have someone help you when you read instructions, pamphlets, or other written material from your doctor or pharmacy?: Often In the past 12 months, has lack of transportation kept you from medical appointments or from getting medications?: Yes In the past 12 months, has lack of transportation kept you from meetings, work, or from getting things needed for daily living?: Yes  Home Assistive Devices / Equipment Home Equipment: Conservation officer, nature (2 wheels)  Prior Device Use: Indicate devices/aids used by the patient prior to current  illness, exacerbation or injury?  Occasional RW   Current Functional Level Cognition  Overall Cognitive Status: Within Functional Limits for tasks assessed Orientation Level: Oriented X4 General Comments: has history of autism.    Extremity Assessment (includes Sensation/Coordination)  Upper Extremity Assessment: Defer to OT evaluation RUE Deficits / Details: noted to have more ataxic movements on this UE with attemtps at finger to nose touch. unable to hold one UE up at same time for test so threapist hand was used. RUE Coordination: decreased fine motor, decreased gross motor LUE Deficits / Details: noted to have some ataxic movements but noted to be able to touch finger to nose test easier using therapist hand.  Lower Extremity Assessment: LLE deficits/detail, RLE deficits/detail RLE Deficits / Details: strong increased extensor tone with movement, plantar flexed ankle, patient breaks  the tone with left leg by pushing back, patient not clear about the sensation, appreciates LT, ataxix movemnts, uncontrolled when steps LLE Deficits / Details: ataxic . Less tone than the right  decreased control, patient reports some  decreased feeling, not specific    ADLs  Overall ADL's : Needs  assistance/impaired Eating/Feeding: Supervision/ safety, Sitting Eating/Feeding Details (indicate cue type and reason): in recliner Grooming: Wash/dry face, Sitting, Minimal assistance Upper Body Bathing: Moderate assistance, Sitting Lower Body Bathing: Sit to/from stand, Sitting/lateral leans, Maximal assistance Upper Body Dressing : Sitting, Moderate assistance Lower Body Dressing: Sit to/from stand, Sitting/lateral leans, Total assistance Toilet Transfer: +2 for physical assistance, +2 for safety/equipment, Moderate assistance Toilet Transfer Details (indicate cue type and reason): patient simulated transfer to recliner with RW use. patient was noted to take large step forwards then when corrected changed to large step towards this Probation officer. patient was able to complete transfer with increased time and +2 for safety. Toileting- Clothing Manipulation and Hygiene: Total assistance, Sit to/from stand Functional mobility during ADLs: +2 for safety/equipment, +2 for physical assistance    Mobility  Overal bed mobility: Needs Assistance Bed Mobility: Supine to Sit Supine to sit: Supervision General bed mobility comments: patient needed physical assistance to move BLE off edge of bed and bring trunk into midline.Noted increased extensor tone of the right leg, Once seated, right knee in extension. Patient used left leg to push the right leg back into flexion.    Transfers  Overall transfer level: Needs assistance Equipment used: Rolling walker (2 wheels) Transfers: Sit to/from Stand Sit to Stand: Min assist, +2 safety/equipment Bed to/from chair/wheelchair/BSC transfer type:: Step pivot General transfer comment: Performed x 2 during session; light min A to steady; vitals monitored (see comments)    Ambulation / Gait / Stairs / Wheelchair Mobility  Ambulation/Gait Ambulation/Gait assistance: Herbalist (Feet): 100 Feet Assistive device: Rolling walker (2 wheels) Gait  Pattern/deviations: Step-through pattern, Decreased stride length, Narrow base of support, Ataxic, Scissoring General Gait Details: Pt with tendency to circumduct, scissor, and ataxia on R side.  Improved with mod cues for increased BOS.  Also, with tendency to get too close to RW - corrected with cues.  Min A to steady and control RW; mod Cues Gait velocity: decreased    Posture / Balance Balance Overall balance assessment: Needs assistance Sitting-balance support: Feet supported, No upper extremity supported Sitting balance-Leahy Scale: Good Postural control: Posterior lean Standing balance support: Reliant on assistive device for balance, Bilateral upper extremity supported Standing balance-Leahy Scale: Poor Standing balance comment: Using RW and min A at times    Special needs/care consideration Behavioral consideration autism spectrum   Previous  Home Environment (from acute therapy documentation) Living Arrangements: Parent Available Help at Discharge: Family, Available 24 hours/day Type of Home: Apartment Home Layout: Two level, Bed/bath upstairs Alternate Level Stairs-Rails: Right Alternate Level Stairs-Number of Steps: 6-8 Home Access: Stairs to enter Entrance Stairs-Rails: Can reach both Entrance Stairs-Number of Steps: 4 with rail in front and 2 without rail in back Bathroom Shower/Tub: Multimedia programmer: Standard  Discharge Living Setting Plans for Discharge Living Setting: Lives with (comment) (family) Type of Home at Discharge: Apartment Discharge Home Layout: Two level, Bed/bath upstairs Alternate Level Stairs-Rails: Left Alternate Level Stairs-Number of Steps: flight Discharge Home Access: Stairs to enter Entrance Stairs-Rails: Right, Left Entrance Stairs-Number of Steps: 2+1 Discharge Bathroom Shower/Tub: Walk-in shower Discharge Bathroom Toilet: Standard Discharge Bathroom Accessibility: Yes How Accessible: Accessible via walker Does the patient  have any problems obtaining your medications?: No  Social/Family/Support Systems Anticipated Caregiver: Izell Vandiver (dad), Dawn (mom), brothers and sisters Anticipated Caregiver's Contact Information: Izell Saco [phone]; Arrie Aran [phone] Ability/Limitations of Caregiver: n/a Caregiver Availability: 24/7 Discharge Plan Discussed with Primary Caregiver: Yes Is Caregiver In Agreement with Plan?: Yes Does Caregiver/Family have Issues with Lodging/Transportation while Pt is in Rehab?: No  Goals Patient/Family Goal for Rehab: PT/OT supervision to mod I; SLP n/a Expected length of stay: 14-16 days Additional Information: with [name] previously Pt/Family Agrees to Admission and willing to participate: Yes Program Orientation Provided & Reviewed with Pt/Caregiver Including Roles  & Responsibilities: Yes  Decrease burden of Care through IP rehab admission: n/a  Possible need for SNF placement upon discharge: n/a  Patient Condition: I have reviewed medical records from [place], spoken with CM, and patient and family member. I met with patient at the bedside for inpatient rehabilitation assessment.  Patient will benefit from ongoing PT and OT, can actively participate in 3 hours of therapy a day 5 days of the week, and can make measurable gains during the admission.  Patient will also benefit from the coordinated team approach during an Inpatient Acute Rehabilitation admission.  The patient will receive intensive therapy as well as Rehabilitation physician, nursing, social worker, and care management interventions.  Due to safety, disease management, medication administration, pain management, and patient education the patient requires 24 hour a day rehabilitation nursing.  The patient is currently max +2 with mobility and basic ADLs.  Discharge setting and therapy post discharge at home with outpatient is anticipated.  Patient has agreed to participate in the Acute Inpatient Rehabilitation Program  and will admit Saturday 1/21.  Preadmission Screen Completed By:  Michel Santee, PT, DPT 10/23/2021 2:54 PM ______________________________________________________________________   Discussed status with Dr. Ranell Patrick on 10/23/21  at 2:57 PM and received approval for admission today.  Admission Coordinator:  Michel Santee, PT, DPT time 2:57 PM Sudie Grumbling 10/23/21    Assessment/Plan: Diagnosis: Multiple sclerosis  Does the need for close, 24 hr/day Medical supervision in concert with the patient's rehab needs make it unreasonable for this patient to be served in a less intensive setting? Yes Co-Morbidities requiring supervision/potential complications: underweight, lower extremity weakness, Autism spectrum disorder, tachycardia, hypokalemia, slurred speech Due to bladder management, bowel management, safety, skin/wound care, disease management, medication administration, pain management, and patient education, does the patient require 24 hr/day rehab nursing? Yes Does the patient require coordinated care of a physician, rehab nurse, PT, OT, and SLP to address physical and functional deficits in the context of the above medical diagnosis(es)? Yes Addressing deficits in the following areas: balance, endurance, locomotion, strength, transferring,  bowel/bladder control, bathing, dressing, feeding, grooming, toileting, cognition, speech, and psychosocial support Can the patient actively participate in an intensive therapy program of at least 3 hrs of therapy 5 days a week? Yes The potential for patient to make measurable gains while on inpatient rehab is excellent Anticipated functional outcomes upon discharge from inpatient rehab: supervision PT, supervision OT, supervision SLP Estimated rehab length of stay to reach the above functional goals is: 5-7 days Anticipated discharge destination: Home 10. Overall Rehab/Functional Prognosis: excellent   MD Signature: Leeroy Cha, MD

## 2021-10-24 ENCOUNTER — Encounter (HOSPITAL_COMMUNITY): Payer: Self-pay | Admitting: Physical Medicine and Rehabilitation

## 2021-10-24 ENCOUNTER — Inpatient Hospital Stay (HOSPITAL_COMMUNITY)
Admission: RE | Admit: 2021-10-24 | Discharge: 2021-11-03 | DRG: 945 | Disposition: A | Discharge status: Home Health | Payer: Medicaid Other | Source: Other Acute Inpatient Hospital | Attending: Physical Medicine and Rehabilitation | Admitting: Physical Medicine and Rehabilitation

## 2021-10-24 DIAGNOSIS — E559 Vitamin D deficiency, unspecified: Secondary | ICD-10-CM | POA: Diagnosis present

## 2021-10-24 DIAGNOSIS — R739 Hyperglycemia, unspecified: Secondary | ICD-10-CM | POA: Diagnosis not present

## 2021-10-24 DIAGNOSIS — R260 Ataxic gait: Secondary | ICD-10-CM | POA: Diagnosis present

## 2021-10-24 DIAGNOSIS — Z681 Body mass index (BMI) 19 or less, adult: Secondary | ICD-10-CM

## 2021-10-24 DIAGNOSIS — Z832 Family history of diseases of the blood and blood-forming organs and certain disorders involving the immune mechanism: Secondary | ICD-10-CM | POA: Diagnosis not present

## 2021-10-24 DIAGNOSIS — R3915 Urgency of urination: Secondary | ICD-10-CM | POA: Diagnosis present

## 2021-10-24 DIAGNOSIS — Z79899 Other long term (current) drug therapy: Secondary | ICD-10-CM | POA: Diagnosis not present

## 2021-10-24 DIAGNOSIS — R251 Tremor, unspecified: Secondary | ICD-10-CM | POA: Diagnosis present

## 2021-10-24 DIAGNOSIS — R5381 Other malaise: Principal | ICD-10-CM | POA: Diagnosis present

## 2021-10-24 DIAGNOSIS — F840 Autistic disorder: Secondary | ICD-10-CM | POA: Diagnosis present

## 2021-10-24 DIAGNOSIS — G35 Multiple sclerosis: Secondary | ICD-10-CM | POA: Diagnosis present

## 2021-10-24 DIAGNOSIS — I951 Orthostatic hypotension: Secondary | ICD-10-CM | POA: Diagnosis not present

## 2021-10-24 DIAGNOSIS — F32A Depression, unspecified: Secondary | ICD-10-CM | POA: Diagnosis present

## 2021-10-24 DIAGNOSIS — R636 Underweight: Secondary | ICD-10-CM | POA: Diagnosis present

## 2021-10-24 MED ORDER — SORBITOL 70 % SOLN
30.0000 mL | Freq: Every day | Status: DC | PRN
  Administered 2021-10-28 – 2021-10-31 (×2): 30 mL via ORAL
  Filled 2021-10-24 (×3): qty 30

## 2021-10-24 MED ORDER — PROCHLORPERAZINE EDISYLATE 10 MG/2ML IJ SOLN: 5.0000 mg | Freq: Four times a day (QID) | INTRAMUSCULAR | Status: DC | PRN

## 2021-10-24 MED ORDER — TRAZODONE HCL 50 MG PO TABS: 25.0000 mg | ORAL_TABLET | Freq: Every evening | ORAL | Status: DC | PRN

## 2021-10-24 MED ORDER — SENNOSIDES-DOCUSATE SODIUM 8.6-50 MG PO TABS: 1.0000 | ORAL_TABLET | Freq: Every evening | ORAL | Status: DC | PRN

## 2021-10-24 MED ORDER — GUAIFENESIN-DM 100-10 MG/5ML PO SYRP: 5.0000 mL | ORAL_SOLUTION | Freq: Four times a day (QID) | ORAL | Status: DC | PRN

## 2021-10-24 MED ORDER — ENOXAPARIN SODIUM 40 MG/0.4ML IJ SOSY
40.0000 mg | PREFILLED_SYRINGE | INTRAMUSCULAR | Status: DC
  Administered 2021-10-24 – 2021-11-02 (×10): 40 mg via SUBCUTANEOUS
  Filled 2021-10-24 (×10): qty 0.4

## 2021-10-24 MED ORDER — PROCHLORPERAZINE 25 MG RE SUPP: 12.5000 mg | Freq: Four times a day (QID) | RECTAL | Status: DC | PRN

## 2021-10-24 MED ORDER — BACLOFEN 5 MG HALF TABLET
5.0000 mg | ORAL_TABLET | Freq: Three times a day (TID) | ORAL | Status: DC
  Administered 2021-10-24 – 2021-11-03 (×30): 5 mg via ORAL
  Filled 2021-10-24 (×30): qty 1

## 2021-10-24 MED ORDER — FLEET ENEMA 7-19 GM/118ML RE ENEM: 1.0000 | ENEMA | Freq: Once | RECTAL | Status: DC | PRN

## 2021-10-24 MED ORDER — PROCHLORPERAZINE MALEATE 5 MG PO TABS: 5.0000 mg | ORAL_TABLET | Freq: Four times a day (QID) | ORAL | Status: DC | PRN

## 2021-10-24 MED ORDER — DULOXETINE HCL 60 MG PO CPEP
60.0000 mg | ORAL_CAPSULE | Freq: Every day | ORAL | Status: DC
  Administered 2021-10-25 – 2021-11-03 (×10): 60 mg via ORAL
  Filled 2021-10-24 (×10): qty 1

## 2021-10-24 MED ORDER — ALUM & MAG HYDROXIDE-SIMETH 200-200-20 MG/5ML PO SUSP
30.0000 mL | ORAL | Status: DC | PRN
Start: 2021-10-24 — End: 2021-11-03

## 2021-10-24 MED ORDER — ACETAMINOPHEN 325 MG PO TABS: 325.0000 mg | ORAL_TABLET | ORAL | Status: DC | PRN

## 2021-10-24 NOTE — TOC Transition Note (Signed)
Transition of Care [facility]) - CM/SW Discharge Note   Patient Details  Name: Jaime [name] MRN: [id number] Date of Birth: 15-Jan-1996  [facility]) CM/SW Contact:  Golda Acre, RN Phone Number: 10/24/2021, 9:38 AM   Clinical Narrative:    Patient transitioned to cir by cir today.  Via carelink.   Final next level of care: IP Rehab Facility Barriers to Discharge: No Barriers Identified   Patient Goals and CMS Choice Patient states their goals for this hospitalization and ongoing recovery are:: to go home CMS Medicare.gov Compare Post Acute Care list provided to:: Patient    Discharge Placement                       Discharge Plan and Services   Discharge Planning Services: CM Consult Post Acute Care Choice: IP Rehab                               Social Determinants of Health (SDOH) Interventions     Readmission Risk Interventions Readmission Risk Prevention Plan 12/17/2019  Medication Screening Complete  Transportation Screening (No Data)  Some recent data might be hidden

## 2021-10-24 NOTE — Progress Notes (Signed)
Patient arrived from [place] via ambulance, . Patient appears alert and denies pain at this time.Marland Kitchen

## 2021-10-24 NOTE — Discharge Summary (Signed)
DISCHARGE SUMMARY  Jaime [name]  MR#: [id number]  DOB:Nov 09, 1995  Date of Admission: 10/18/2021 Date of Discharge: 10/24/2021  Attending Physician:Earnestene Angello Silvestre Gunner, MD  Patient's RCV:[id number], [name], MD  Consults: Neurology CIR  Disposition: D/C to CIR   Follow-up Appts: Timing to be determined at time of D/C from CIR. Pt should follow up with Dr. Despina Arias with [facility] Neu[facility] for ongoing care of his MS.   Discharge Diagnoses: Acute flare of multiple sclerosis Hypokalemia  Initial presentation: 25yo with a history of MS, spasticity, hypotension, and autism who presented to the ED 10/18/2021 with increasing weakness over 1-2 weeks accompanied by slurred speech and mild drooping of the left side of his face thought to represent an MS flare.  Hospital Course: The patient was admitted to the acute units following the above presentation.  Neurology was consulted.  Clinically it was felt the patient was suffering with an acute flare of his MS.  MRI of the brain and C-spine was accomplished and confirmed this diagnosis.  The patient was treated with 5 days of high-dose steroid therapy along with CBG monitoring and PPI prophylaxis.  He tolerated this therapy well and experienced progressive improvement in his symptoms.  Slurring of speech is essentially resolved at time of discharge.  Strengthhas improved but he remains uncoordinated and unstable in his attempts to ambulate.  He has been evaluated by physical therapy occupational therapy and neurology who all agree that he would benefit from inpatient rehab.  He will be discharged from the acute units and transferred to inpatient rehab with the ultimate disposition being discharged home after aggressive rehab is completed.  No Known Allergies   Active Medications at time of D/C to CIR   baclofen  5 mg Oral TID   DULoxetine  60 mg Oral Daily   enoxaparin (LOVENOX) injection  40 mg Subcutaneous Q24H     Day of Discharge BP  123/69 (BP Location: Right Arm)    Pulse (!) 57    Temp 97.9 F (36.6 C) (Oral)    Resp 16    Ht 5\' 9"  (1.753 m)    Wt 51.3 kg    SpO2 96%    BMI 16.69 kg/m   Physical Exam: General: No acute respiratory distress Lungs: Clear to auscultation bilaterally without wheezes or crackles Cardiovascular: Regular rate and rhythm without murmur gallop or rub normal S1 and S2 Abdomen: Nontender, nondistended, soft, bowel sounds positive, no rebound, no ascites, no appreciable mass Extremities: No significant cyanosis, clubbing, or edema bilateral lower extremities  Basic Metabolic Panel: Recent Labs  Lab 10/18/21 2024 10/18/21 2207 10/19/21 0407 10/21/21 0358 10/23/21 0346  NA 138  --  140 132* 137  K 3.2*  --  3.5 4.3 4.2  CL 107  --  109 102 106  CO2 24  --  25 23 24   GLUCOSE 126*  --  97 134* 135*  BUN 13  --  14 16 21*  CREATININE 0.68  --  0.83 0.87 0.92  CALCIUM 9.2  --  8.5* 9.6 8.7*  MG  --  1.9  --   --   --     Liver Function Tests: Recent Labs  Lab 10/18/21 2024  AST 21  ALT 17  ALKPHOS 51  BILITOT 1.0  PROT 7.5  ALBUMIN 4.6    CBC: Recent Labs  Lab 10/18/21 2024 10/19/21 0407 10/21/21 0358  WBC 12.6* 9.7 12.4*  NEUTROABS 8.0*  --  10.6*  HGB 15.3 13.6  16.3  HCT 44.9 40.5 47.9  MCV 89.6 90.2 89.0  PLT 232 200 241   Cardiac Enzymes: Recent Labs  Lab 10/18/21 2024  CKTOTAL 447*    CBG: Recent Labs  Lab 10/22/21 0608 10/22/21 1231 10/23/21 0040 10/23/21 0540 10/23/21 1410  GLUCAP 144* 165* 127* 130* 91     Time spent in discharge (includes decision making & examination of pt): 30 minutes  10/24/2021, 9:24 AM   Lonia Blood, MD [facility] Office  [phone]

## 2021-10-24 NOTE — Progress Notes (Signed)
Inpatient Rehab Admissions Coordinator:    Pt. Will admit to [facility] CIR today. [facility] transport set for 1:00. RN may call report to [phone].  Megan Salon, MS, CCC-SLP Rehab Admissions Coordinator  [phone] (celll) [phone] (office)

## 2021-10-24 NOTE — Evaluation (Signed)
Occupational Therapy Assessment and Plan  Patient Details  Name: Jaime [name] MRN: [id number] Date of Birth: 12/17/1995  OT Diagnosis: abnormal posture, ataxia, cognitive deficits, muscle weakness (generalized), and coordination disorder Rehab Potential: Rehab Potential (ACUTE ONLY): Good ELOS: 7-10 days   Today's Date: 10/25/2021 OT Individual Time: 3016-0109 OT Individual Time Calculation (min): 71 min     Hospital Problem: Principal Problem:   Multiple sclerosis exacerbation ([place])   Past Medical History:  Past Medical History:  Diagnosis Date   Autism    Depression    MS (multiple sclerosis) ([place])    Past Surgical History:  Past Surgical History:  Procedure Laterality Date   NO PAST SURGERIES      Assessment & Plan Clinical Impression: Jaime [name] is a 26 year old male who presented to [facility] emergency department on 10/18/2021 with increasing bilateral lower extremity weakness preceding the previous 1 to 2 weeks.  This was accompanied by slurred speech and mild drooping of the left side of his face.  He has a history of multiple sclerosis and this was thought to be an MS flare.  As part of his admission work-up, MRI of the brain and C-spine were completed which noted extensive multiple sclerosis with multiple new demyelinating lesions in the brain and spinal cord.  Neurology was consulted and the patient was placed on high-dose steroid therapy.  CT head unremarkable. On presentation, he reported multiple falls without injuries.  He denied upper extremity weakness, voiding difficulty or vision changes.   The patient is followed as an outpatient by Dr. Felecia Shelling and has received Tysabri (anatalizumab) infusions.  His last infusion was Tysabri 300 mg on 10/15/2021.  The patient at that time endorsed increased weakness in his right leg for approximately 1 week.  A follow-up appoint with Dr. Felecia Shelling was arranged.   There are medical conditions include depression  maintained on Cymbalta.  He has a history of autism spectrum disorder.  Lives at home with family and his mother accompanied him to emergency department.  He denies alcohol, tobacco or illicit drug use.  Patient currently requires min with basic self-care skills secondary to muscle weakness, decreased cardiorespiratoy endurance, ataxia, decreased coordination, and decreased motor planning, decreased problem solving, decreased memory, and delayed processing, and decreased standing balance, hemiplegia, and decreased balance strategies.  Prior to hospitalization, patient could complete BADLs with supervision-Min A.   Patient will benefit from skilled intervention to increase independence with basic self-care skills prior to discharge  home with family support .  Anticipate patient will require 24 hour supervision and minimal physical assistance and follow up home health.  OT - End of Session Endurance Deficit: Yes Endurance Deficit Description: Pt required rest after standing activity OT Assessment Rehab Potential (ACUTE ONLY): Good OT Barriers to Discharge: Home environment access/layout OT Barriers to Discharge Comments: Home is multilevel, can pt reside on main level? OT Patient demonstrates impairments in the following area(s): Balance;Endurance;Motor;Safety;Sensory OT Basic ADL's Functional Problem(s): Grooming;Bathing;Dressing;Toileting OT Advanced ADL's Functional Problem(s): Simple Meal Preparation OT Transfers Functional Problem(s): Toilet;Tub/Shower OT Additional Impairment(s): Fuctional Use of Upper Extremity (due to ataxia) OT Plan OT Intensity: Minimum of 1-2 x/day, 45 to 90 minutes OT Frequency: 5 out of 7 days OT Duration/Estimated Length of Stay: 7-10 days OT Treatment/Interventions: Balance/vestibular training;Disease mangement/prevention;Self Care/advanced ADL retraining;Therapeutic Exercise;DME/adaptive equipment instruction;Cognitive remediation/compensation;Pain  management;Skin care/wound managment;UE/LE Strength taining/ROM;Patient/family education;Community reintegration;Discharge planning;Functional mobility training;Psychosocial support;Therapeutic Activities OT Self Feeding Anticipated Outcome(s): supervision OT Basic Self-Care Anticipated Outcome(s): supervision OT Toileting Anticipated Outcome(s):  supervision OT Bathroom Transfers Anticipated Outcome(s): supervision-CGA OT Recommendation Recommendations for Other Services: Other (comment) (none) Patient destination: Home Equipment Recommended: To be determined   OT Evaluation Precautions/Restrictions  Precautions Precautions: Fall Precaution Comments: [place] BP /HR Restrictions Weight Bearing Restrictions: No Home Living/Prior Functioning Home Living Available Help at Discharge: Family, Available 24 hours/day Type of Home: Apartment Home Access: Stairs to enter [employer] of Steps: 4 with rail in front and 2 without rail in back Entrance Stairs-Rails: Can reach both Home Layout: Two level, Bed/bath upstairs Alternate Level Stairs-Number of Steps: 6-8 Alternate Level Stairs-Rails: Right Bathroom Shower/Tub: Multimedia programmer: Standard Bathroom Accessibility: Yes Additional Comments: per chart, pt unable to clarify home setup  Lives With: Family IADL History Homemaking Responsibilities: No Leisure and Hobbies: Drawing and playing videogames Prior Function Level of Independence: Needs assistance with ADLs, Needs assistance with homemaking (Pt left CIR at [employer] A level using RW)  Able to Take Stairs?: Yes Driving: No Vision Baseline Vision/History: 1 Wears glasses Ability to See in Adequate Light: 1 Impaired Patient Visual Report: No change from baseline Vision Assessment?: No apparent visual deficits Perception  Perception: Within Functional Limits Praxis Praxis: Intact Cognition Overall Cognitive Status: Within Functional Limits for  tasks assessed Arousal/Alertness: Awake/alert Orientation Level: Person;Place;Situation Person: Oriented Place: Oriented Situation: Oriented Year: 2023 Month: January Day of Week: Correct Memory: Impaired Memory Impairment: Decreased short term memory (when asked if pt used a RW at home stated that he had never used one before which is inaccurate as pt d/c from CIR x2 using RW for functional transfer support, ?accuracy of his PLOF, stated he "furniture walked" unassisted PTA) Immediate Memory Recall: Sock;Blue;Bed Memory Recall Sock: Without Cue Memory Recall Blue: Without Cue Memory Recall Bed: Without Cue Attention: Divided Focused Attention: Appears intact Sustained Attention: Appears intact Awareness: Impaired Awareness Impairment: Anticipatory impairment Problem Solving: Impaired Safety/Judgment: Impaired Sensation Sensation Light Touch: Impaired Detail Light Touch Impaired Details: Impaired RLE;Impaired LLE (impaired distally) Proprioception: Impaired Detail Proprioception Impaired Details: Impaired RLE;Impaired LLE Coordination Gross Motor Movements are Fluid and Coordinated: No Fine Motor Movements are Fluid and Coordinated: No Coordination and Movement Description: ataxic limbs + trunk Finger Nose Finger Test: Ataxic bilaterally Heel Shin Test: impaired Bilaterlly, R>L Motor  Motor Motor: Abnormal tone;Ataxia;Abnormal postural alignment and control Motor - Skilled Clinical Observations: ataxic limbs, impaired postural control during functional activity  Trunk/Postural Assessment  Cervical Assessment Cervical Assessment: Exceptions to [facility] (forward head) Thoracic Assessment Thoracic Assessment: Exceptions to [facility] (rounded shoulders) Lumbar Assessment Lumbar Assessment: Within Functional Limits Postural Control Postural Control: Deficits on evaluation (limited in standing) Righting Reactions: delayed/insufficient  Balance Balance Balance Assessed: Yes Static  Sitting Balance Static Sitting - Balance Support: No upper extremity supported;Feet supported Static Sitting - Level of Assistance: 5: Stand by assistance Dynamic Sitting Balance Dynamic Sitting - Balance Support: During functional activity Dynamic Sitting - Level of Assistance: 5: Stand by assistance (donning gripper socks) Dynamic Sitting - Balance Activities: Lateral lean/weight shifting;Forward lean/weight shifting Static Standing Balance Static Standing - Balance Support: Bilateral upper extremity supported;During functional activity Static Standing - Level of Assistance: 4: Min assist Dynamic Standing Balance Dynamic Standing - Balance Support: During functional activity;No upper extremity supported Dynamic Standing - Level of Assistance: 4: Min assist Dynamic Standing - Balance Activities: Lateral lean/weight shifting;Forward lean/weight shifting (LB dressing) Extremity/Trunk Assessment RUE Assessment RUE Assessment: Within Functional Limits (ataxic) Active Range of Motion (AROM) Comments: WNL LUE Assessment LUE Assessment: Within Functional Limits (ataxic) Active Range of Motion (  AROM) Comments: WNL  Care Tool Care Tool Self Care Eating    Not assessed    Oral Care     Not assessed    Bathing Bathing activity did not occur:  (not assessed due to time constraints)            Upper Body Dressing(including orthotics)   What is the patient wearing?: Pull over shirt   Assist Level: Supervision/Verbal cueing    Lower Body Dressing (excluding footwear)   What is the patient wearing?: Pants Assist for lower body dressing: Minimal Assistance - Patient > 75%    Putting on/Taking off footwear   What is the patient wearing?: Non-skid slipper socks;Ted hose Assist for footwear: Moderate Assistance - Patient 50 - 74%       Care Tool Bed Mobility Roll left and right activity   Roll left and right assist level: Supervision/Verbal cueing    Sit to lying activity   Sit  to lying assist level: Supervision/Verbal cueing    Lying to sitting on side of bed activity   Lying to sitting on side of bed assist level: the ability to move from lying on the back to sitting on the side of the bed with no back support.: Supervision/Verbal cueing     Care Tool Transfers Sit to stand transfer   Sit to stand assist level: Minimal Assistance - Patient > 75%    Chair/bed transfer   Chair/bed transfer assist level: Minimal Assistance - Patient > 75%     Toilet transfer   Assist Level: Minimal Assistance - Patient > 75%     Care Tool Cognition  Expression of Ideas and Wants Expression of Ideas and Wants: 3. Some difficulty - exhibits some difficulty with expressing needs and ideas (e.g, some words or finishing thoughts) or speech is not clear  Understanding Verbal and Non-Verbal Content Understanding Verbal and Non-Verbal Content: 3. Usually understands - understands most conversations, but misses some part/intent of message. Requires cues at times to understand   Memory/Recall Ability Memory/Recall Ability : That he or she is in a hospital/hospital unit   Refer to Care Plan for [place] 1 OT Short Term Goal 1 (Week 1): STGs=LTGs due to ELOS  Recommendations for other services: None    Skilled Therapeutic Intervention Skilled OT session completed with focus on initial evaluation, education on OT role/POC, and establishment of patient-centered goals.  Pt greeted in bed, asleep, easily woken, had not yet eaten breakfast. No c/o pain. BP while long sitting in bed 107/76 with pt reporting that he was "a little bit" dizzy. Transitioned to EOB unassisted but exhibiting 4 limb and truncal ataxia. Ataxic UE movement when eating breakfast, pt compensating by limiting degrees of freedom via flexing trunk towards plate. He needed A to open condiments and water bottle cap due to coordination deficits. Pt required significantly increased time to chew  and motor plan sequence of eating, also increased time to problem solve. Min cues to cut up his pancake vs take bites of pancake when full cake was speared on fork and dripping syrup into his lap. Note that pt had a tough time with dual task, I.e. eating while OT asked PLOF questions. After eating his BP was 121/99, reassessed in less than a minute and 128/83. RN informed. Supervision for donning overhead shirt and Min A for donning pants (after OT donned thigh high Teds). Sit<stand with RW completed with CGA, Min balance assist in standing while  he elevated pants. Pt then stood for ~1 minute and BP was reassessed, reading: 96/52. Pt reported moderate dizziness at this time. He then returned to bed, BP before OT departure 120/76. Pt reporting improvement in orthostatic symptoms. He remained in bed with all needs within reach and bed alarm set.   Note that pts abdominal binder was not in his room, informed MD so that this could be delivered at some point today  ADL ADL Eating: Minimal assistance Where Assessed-Eating: Edge of bed Grooming: Minimal assistance Where Assessed-Grooming: Edge of bed Upper Body Bathing: Not assessed Lower Body Bathing: Not assessed Upper Body Dressing: Supervision/safety Where Assessed-Upper Body Dressing: Edge of bed Lower Body Dressing: Minimal assistance Where Assessed-Lower Body Dressing: Edge of bed Toileting: Not assessed Tub/Shower Transfer: Not assessed Mobility  Bed Mobility Bed Mobility: Rolling Right;Rolling Left;Supine to Sit;Sit to Supine Rolling Right: Supervision/verbal cueing Rolling Left: Supervision/Verbal cueing Supine to Sit: Supervision/Verbal cueing Sit to Supine: Supervision/Verbal cueing Transfers Sit to Stand: Minimal Assistance - Patient > 75% Stand to Sit: Minimal Assistance - Patient > 75%   Discharge Criteria: Patient will be discharged from OT if patient refuses treatment 3 consecutive times without medical reason, if treatment  goals not met, if there is a change in medical status, if patient makes no progress towards goals or if patient is discharged from hospital.  The above assessment, treatment plan, treatment alternatives and goals were discussed and mutually agreed upon: by patient  Skeet Simmer 10/25/2021, 12:36 PM

## 2021-10-24 NOTE — Progress Notes (Signed)
Inpatient Rehabilitation Admission Medication Review by a Pharmacist  A complete drug regimen review was completed for this patient to identify any potential clinically significant medication issues.  High Risk Drug Classes Is patient taking? Indication by Medication  Antipsychotic Yes Prochlorperazine prn - Nausea  Anticoagulant Yes Enoxaparin - DVT prophylaxis   Antibiotic No   Opioid No   Antiplatelet No   Hypoglycemics/insulin No   Vasoactive Medication No   Chemotherapy No   Other Yes Baclofen - Muscle spasms Duloxetine - Depression Trazodone prn - Sleep      Type of Medication Issue Identified Description of Issue Recommendation(s)  Drug Interaction(s) (clinically significant)     Duplicate Therapy     Allergy     No Medication Administration End Date     Incorrect Dose     Additional Drug Therapy Needed     Significant med changes from prior encounter (inform family/care partners about these prior to discharge).    Other       Clinically significant medication issues were identified that warrant physician communication and completion of prescribed/recommended actions by midnight of the next day:  No  Time spent performing this drug regimen review (minutes):  20   Vance Peper, PharmD PGY1 Pharmacy Resident 10/24/2021 4:02 PM   Please check AMION for all [place] phone numbers After 10:00 PM, call [place] [phone]

## 2021-10-24 NOTE — H&P (Signed)
Physical Medicine and Rehabilitation Admission H&P  CC:  Functional deficits due to acute flare MS  HPI: Jaime [name] is a 26 year old male who presented to [facility] emergency department on 10/18/2021 with increasing bilateral lower extremity weakness preceding the previous 1 to 2 weeks.  This was accompanied by slurred speech and mild drooping of the left side of his face.  He has a history of multiple sclerosis and this was thought to be an MS flare.  As part of his admission work-up, MRI of the brain and C-spine were completed which noted extensive multiple sclerosis with multiple new demyelinating lesions in the brain and spinal cord.  Neurology was consulted and the patient was placed on high-dose steroid therapy.  CT head unremarkable. On presentation, he reported multiple falls without injuries.  He denied upper extremity weakness, voiding difficulty or vision changes.  The patient is followed as an outpatient by Dr. Epimenio Foot and has received Tysabri (anatalizumab) infusions.  His last infusion was Tysabri 300 mg on 10/15/2021.  The patient at that time endorsed increased weakness in his right leg for approximately 1 week.  A follow-up appoint with Dr. Epimenio Foot was arranged.  There are medical conditions include depression maintained on Cymbalta.  He has a history of autism spectrum disorder.  Lives at home with family and his mother accompanied him to emergency department.  He denies alcohol, tobacco or illicit drug use.   Review of Systems  Constitutional: Negative.   HENT: Negative.    Eyes: Negative.   Respiratory: Negative.    Cardiovascular: Negative.   Gastrointestinal: Negative.   Genitourinary: Negative.   Musculoskeletal: Negative.   Skin: Negative.   Neurological:  Positive for focal weakness.  Endo/Heme/Allergies: Negative.   Psychiatric/Behavioral:  Positive for depression.   Past Medical History:  Diagnosis Date   Autism    Depression    MS (multiple  sclerosis) (HCC)    Past Surgical History:  Procedure Laterality Date   NO PAST SURGERIES     Family History  Problem Relation Age of Onset   Clotting disorder Mother    Social History:  reports that he has never smoked. He has never used smokeless tobacco. He reports that he does not drink alcohol and does not use drugs. Allergies: No Known Allergies Medications Prior to Admission  Medication Sig Dispense Refill   acetaminophen (TYLENOL) 500 MG tablet Take 1,000 mg by mouth every 6 (six) hours as needed for headache (pain).     Baclofen 5 MG TABS Take 5 mg by mouth 3 (three) times daily. 90 tablet 11   docusate sodium (COLACE) 100 MG capsule Take 1 capsule (100 mg total) by mouth 3 (three) times daily. (Patient taking differently: Take 100 mg by mouth 3 (three) times daily as needed (constipation).) 10 capsule 0   DULoxetine (CYMBALTA) 60 MG capsule Take 1 capsule (60 mg total) by mouth daily. (Patient taking differently: Take 60 mg by mouth daily as needed (depression/sadness (give zofran with each dose)).) 90 capsule 5   midodrine (PROAMATINE) 10 MG tablet Take 1 tablet (10 mg total) by mouth 2 (two) times daily with a meal. 60 tablet 11   ondansetron (ZOFRAN) 4 MG tablet Take 1 tablet (4 mg total) by mouth every 8 (eight) hours as needed for nausea or vomiting. (Patient taking differently: Take 4 mg by mouth daily as needed (with each dose of Cymbalta (duloxetine)).) 60 tablet 0    Drug Regimen Review  Drug regimen was reviewed  and remains appropriate with no significant issues identified  Home: Home Living Family/patient expects to be discharged to:: Private residence Living Arrangements: Parent Available Help at Discharge: Family, Available 24 hours/day Type of Home: Apartment Home Access: Stairs to enter [employer] of Steps: 4 with rail in front and 2 without rail in back Entrance Stairs-Rails: Can reach both Home Layout: Two level, Bed/bath upstairs Alternate  Level Stairs-Number of Steps: 6-8 Alternate Level Stairs-Rails: Right Bathroom Shower/Tub: Health visitor: Standard Home Equipment: Agricultural consultant (2 wheels)   Functional History: Prior Function Prior Level of Function : Needs assist   Functional Status:  Mobility: Bed Mobility Overal bed mobility: Needs Assistance Bed Mobility: Supine to Sit Supine to sit: Mod assist General bed mobility comments: patient needed physical assistance to move BLE off edge of bed and bring trunk into midline.Noted increased extensor tone of the right leg, Once seated, right knee in extension. Patient used left leg to push the right leg back into flexion. Transfers Overall transfer level: Needs assistance Equipment used: Rolling walker (2 wheels) Transfers: Sit to/from Stand, Bed to chair/wheelchair/BSC Sit to Stand: Max assist, +2 physical assistance, +2 safety/equipment Bed to/from chair/wheelchair/BSC transfer type:: Step pivot General transfer comment: Right leg abducted when taking a step trying to move to the left to recliner, knees flexed when standing, ataxic movements , more on right. poor control for stepping.   ADL: ADL Overall ADL's : Needs assistance/impaired Eating/Feeding: Supervision/ safety, Sitting Eating/Feeding Details (indicate cue type and reason): in recliner Grooming: Wash/dry face, Sitting, Minimal assistance Upper Body Bathing: Moderate assistance, Sitting Lower Body Bathing: Sit to/from stand, Sitting/lateral leans, Maximal assistance Upper Body Dressing : Sitting, Moderate assistance Lower Body Dressing: Sit to/from stand, Sitting/lateral leans, Total assistance Toilet Transfer: +2 for physical assistance, +2 for safety/equipment, Moderate assistance Toilet Transfer Details (indicate cue type and reason): patient simulated transfer to recliner with RW use. patient was noted to take large step forwards then when corrected changed to large step towards this  Clinical research associate. patient was able to complete transfer with increased time and +2 for safety. Toileting- Clothing Manipulation and Hygiene: Total assistance, Sit to/from stand Functional mobility during ADLs: +2 for safety/equipment, +2 for physical assistance   Cognition: Cognition Overall Cognitive Status: Within Functional Limits for tasks assessed Orientation Level: Oriented X4 Cognition Arousal/Alertness: Awake/alert Behavior During Therapy: WFL for tasks assessed/performed Overall Cognitive Status: Within Functional Limits for tasks assessed General Comments: has history of autism.    Physical Exam: Blood pressure (!) 135/97, pulse (!) 58, temperature 98 F (36.7 C), temperature source Oral, resp. rate 16, height 5\' 9"  (1.753 m), weight 53.9 kg, SpO2 100 %. Physical Exam Gen: no distress, normal appearing, BMI 17.55 HEENT: oral mucosa pink and moist, NCAT Cardio: Bradycardia Chest: normal effort, normal rate of breathing Abd: soft, non-distended Ext: no edema Psych: pleasant, flat affect Skin: intact Neuro/Musculoskeletal: Alert, 5/5 strength throughout with the exception of 4/5 bilateral PF/DF. Sensation intact.   Results for orders placed or performed during the hospital encounter of 10/18/21 (from the past 48 hour(s))  Glucose, capillary     Status: Abnormal   Collection Time: 10/23/21 12:40 AM  Result Value Ref Range   Glucose-Capillary 127 (H) 70 - 99 mg/dL    Comment: Glucose reference range applies only to samples taken after fasting for at least 8 hours.  Basic metabolic panel     Status: Abnormal   Collection Time: 10/23/21  3:46 AM  Result Value Ref Range  Sodium 137 135 - 145 mmol/L   Potassium 4.2 3.5 - 5.1 mmol/L   Chloride 106 98 - 111 mmol/L   CO2 24 22 - 32 mmol/L   Glucose, Bld 135 (H) 70 - 99 mg/dL    Comment: Glucose reference range applies only to samples taken after fasting for at least 8 hours.   BUN 21 (H) 6 - 20 mg/dL   Creatinine, Ser 1.55 0.61 -  1.24 mg/dL   Calcium 8.7 (L) 8.9 - 10.3 mg/dL   GFR, Estimated >20 >80 mL/min    Comment: (NOTE) Calculated using the CKD-EPI Creatinine Equation (2021)    Anion gap 7 5 - 15    Comment: Performed at [facility], [address], [place], [place] [address]  Glucose, capillary     Status: Abnormal   Collection Time: 10/23/21  5:40 AM  Result Value Ref Range   Glucose-Capillary 130 (H) 70 - 99 mg/dL    Comment: Glucose reference range applies only to samples taken after fasting for at least 8 hours.  Glucose, capillary     Status: None   Collection Time: 10/23/21  2:10 PM  Result Value Ref Range   Glucose-Capillary 91 70 - 99 mg/dL    Comment: Glucose reference range applies only to samples taken after fasting for at least 8 hours.   No results found.     Medical Problem List and Plan: 1. Functional deficits secondary to multiple sclerosis flare and lower extremity weakness  -patient may shower  -ELOS/Goals: 5-7 days S  Admit to CIR 2.  Antithrombotics: -DVT/anticoagulation:  Pharmaceutical: Lovenox  -antiplatelet therapy: None 3. Pain Management: Tylenol, baclofen as needed 4. Mood: LCSW to evaluate and provide emotional support. Continue Cymbalta  -antipsychotic agents: n/a 5. Neuropsych: This patient is capable of making decisions on his own behalf. 6. Skin/Wound Care: Routine skin care checks 7. Fluids/Electrolytes/Nutrition: Routine Is and Os and follow-up chemistries 8: Autism spectrum disorder 9: Depression: Continue Cymbalta. Check Vitamin D and magnesium levels tomorrow 10: Underweight: BMI 17.55: encourage adequate nutrition. 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    I have personally performed a face to face diagnostic evaluation, including, but not limited to relevant history and physical exam findings, of this patient and developed relevant assessment and plan.  Additionally, I have reviewed and concur with the physician assistant's  documentation above.  Wendi Maya, PA  Horton Chin, MD 10/24/2021

## 2021-10-24 NOTE — Progress Notes (Signed)
Report called to CIR [phone].

## 2021-10-25 LAB — COMPREHENSIVE METABOLIC PANEL
ALT: 17 U/L (ref 0–44)
AST: 15 U/L (ref 15–41)
Albumin: 4.1 g/dL (ref 3.5–5.0)
Alkaline Phosphatase: 47 U/L (ref 38–126)
Anion gap: 3 — ABNORMAL LOW (ref 5–15)
BUN: 20 mg/dL (ref 6–20)
CO2: 27 mmol/L (ref 22–32)
Calcium: 8.5 mg/dL — ABNORMAL LOW (ref 8.9–10.3)
Chloride: 108 mmol/L (ref 98–111)
Creatinine, Ser: 0.89 mg/dL (ref 0.61–1.24)
GFR, Estimated: >60 mL/min (ref >60)
Glucose, Bld: 94 mg/dL (ref 70–99)
Potassium: 4 mmol/L (ref 3.5–5.1)
Sodium: 138 mmol/L (ref 135–145)
Total Bilirubin: 1 mg/dL (ref 0.3–1.2)
Total Protein: 6.8 g/dL (ref 6.5–8.1)

## 2021-10-25 LAB — CBC WITH DIFFERENTIAL/PLATELET
Abs Immature Granulocytes: 0.05 10*3/uL (ref 0.00–0.07)
Basophils Absolute: 0 10*3/uL (ref 0.0–0.1)
Basophils Relative: 0 %
Eosinophils Absolute: 0 10*3/uL (ref 0.0–0.5)
Eosinophils Relative: 0 %
HCT: 44.5 % (ref 39.0–52.0)
Hemoglobin: 16.3 g/dL (ref 13.0–17.0)
Immature Granulocytes: 1 %
Lymphocytes Relative: 35 %
Lymphs Abs: 3.9 10*3/uL (ref 0.7–4.0)
MCH: 32.6 pg (ref 26.0–34.0)
MCHC: 36.6 g/dL — ABNORMAL HIGH (ref 30.0–36.0)
MCV: 89 fL (ref 80.0–100.0)
Monocytes Absolute: 0.7 10*3/uL (ref 0.1–1.0)
Monocytes Relative: 7 %
Neutro Abs: 6.3 10*3/uL (ref 1.7–7.7)
Neutrophils Relative %: 57 %
Platelets: 419 10*3/uL — ABNORMAL HIGH (ref 150–400)
RBC: 5 MIL/uL (ref 4.22–5.81)
RDW: 13.5 % (ref 11.5–15.5)
WBC: 11 10*3/uL — ABNORMAL HIGH (ref 4.0–10.5)
nRBC: 0.3 % — ABNORMAL HIGH (ref 0.0–0.2)

## 2021-10-25 LAB — MAGNESIUM: Magnesium: 2.4 mg/dL (ref 1.7–2.4)

## 2021-10-25 LAB — VITAMIN D 25 HYDROXY (VIT D DEFICIENCY, FRACTURES): Vit D, 25-Hydroxy: 9.39 ng/mL — ABNORMAL LOW (ref 30–100)

## 2021-10-25 MED ORDER — VITAMIN D (ERGOCALCIFEROL) 1.25 MG (50000 UNIT) PO CAPS
50000.0000 [IU] | ORAL_CAPSULE | ORAL | Status: DC
  Administered 2021-10-25 – 2021-11-01 (×2): 50000 [IU] via ORAL
  Filled 2021-10-25 (×3): qty 1

## 2021-10-25 NOTE — Plan of Care (Signed)
°  Problem: RH Balance Goal: LTG Patient will maintain dynamic standing with ADLs (OT) Description: LTG:  Patient will maintain dynamic standing balance with assist during activities of daily living (OT)  Flowsheets (Taken 10/25/2021 1239) LTG: Pt will maintain dynamic standing balance during ADLs with: Supervision/Verbal cueing   Problem: Sit to Stand Goal: LTG:  Patient will perform sit to stand in prep for activites of daily living with assistance level (OT) Description: LTG:  Patient will perform sit to stand in prep for activites of daily living with assistance level (OT) Flowsheets (Taken 10/25/2021 1239) LTG: PT will perform sit to stand in prep for activites of daily living with assistance level: Supervision/Verbal cueing   Problem: RH Grooming Goal: LTG Patient will perform grooming w/assist,cues/equip (OT) Description: LTG: Patient will perform grooming with assist, with/without cues using equipment (OT) Flowsheets (Taken 10/25/2021 1239) LTG: Pt will perform grooming with assistance level of: Set up assist    Problem: RH Bathing Goal: LTG Patient will bathe all body parts with assist levels (OT) Description: LTG: Patient will bathe all body parts with assist levels (OT) Flowsheets (Taken 10/25/2021 1239) LTG: Pt will perform bathing with assistance level/cueing: Supervision/Verbal cueing   Problem: RH Dressing Goal: LTG Patient will perform upper body dressing (OT) Description: LTG Patient will perform upper body dressing with assist, with/without cues (OT). Flowsheets (Taken 10/25/2021 1239) LTG: Pt will perform upper body dressing with assistance level of: (excluding abdominal binder)  Set up assist  Other (Comment) Goal: LTG Patient will perform lower body dressing w/assist (OT) Description: LTG: Patient will perform lower body dressing with assist, with/without cues in positioning using equipment (OT) Flowsheets (Taken 10/25/2021 1239) LTG: Pt will perform lower body  dressing with assistance level of: (excluding Teds)  Supervision/Verbal cueing  Other (Comment)   Problem: RH Toileting Goal: LTG Patient will perform toileting task (3/3 steps) with assistance level (OT) Description: LTG: Patient will perform toileting task (3/3 steps) with assistance level (OT)  Flowsheets (Taken 10/25/2021 1239) LTG: Pt will perform toileting task (3/3 steps) with assistance level: Supervision/Verbal cueing   Problem: RH Toilet Transfers Goal: LTG Patient will perform toilet transfers w/assist (OT) Description: LTG: Patient will perform toilet transfers with assist, with/without cues using equipment (OT) Flowsheets (Taken 10/25/2021 1239) LTG: Pt will perform toilet transfers with assistance level of: Supervision/Verbal cueing   Problem: RH Tub/Shower Transfers Goal: LTG Patient will perform tub/shower transfers w/assist (OT) Description: LTG: Patient will perform tub/shower transfers with assist, with/without cues using equipment (OT) Flowsheets (Taken 10/25/2021 1239) LTG: Pt will perform tub/shower stall transfers with assistance level of: Contact Guard/Touching assist

## 2021-10-25 NOTE — Evaluation (Signed)
Physical Therapy Assessment and Plan  Patient Details  Name: Jaime [name] MRN: [id number] Date of Birth: 1996-02-17  PT Diagnosis: Abnormal posture, Abnormality of gait, Ataxia, Ataxic gait, Difficulty walking, and Impaired sensation Rehab Potential: Good ELOS: 7-10 days   Today's Date: 10/25/2021 PT Individual Time: 1100-1200 PT Individual Time Calculation (min): 60 min    Hospital Problem: Principal Problem:   Multiple sclerosis exacerbation ([place])   Past Medical History:  Past Medical History:  Diagnosis Date   Autism    Depression    MS (multiple sclerosis) ([place])    Past Surgical History:  Past Surgical History:  Procedure Laterality Date   NO PAST SURGERIES      Assessment & Plan Clinical Impression:  Jaime [name] is a 26 year old male who presented to [facility] emergency department on 10/18/2021 with increasing bilateral lower extremity weakness preceding the previous 1 to 2 weeks.  This was accompanied by slurred speech and mild drooping of the left side of his face.  He has a history of multiple sclerosis and this was thought to be an MS flare.  As part of his admission work-up, MRI of the brain and C-spine were completed which noted extensive multiple sclerosis with multiple new demyelinating lesions in the brain and spinal cord.  Neurology was consulted and the patient was placed on high-dose steroid therapy.  CT head unremarkable. On presentation, he reported multiple falls without injuries.  He denied upper extremity weakness, voiding difficulty or vision changes.   The patient is followed as an outpatient by Dr. Felecia Shelling and has received Tysabri (anatalizumab) infusions.  His last infusion was Tysabri 300 mg on 10/15/2021.  The patient at that time endorsed increased weakness in his right leg for approximately 1 week.  A follow-up appoint with Dr. Felecia Shelling was arranged.   There are medical conditions include depression maintained on Cymbalta.  He has a  history of autism spectrum disorder.  Lives at home with family and his mother accompanied him to emergency department.  He denies alcohol, tobacco or illicit drug use.  Patient transferred to CIR on 10/24/2021 .   Patient currently requires min with mobility secondary to muscle weakness, decreased cardiorespiratoy endurance, abnormal tone, ataxia, and decreased coordination, and decreased sitting balance, decreased standing balance, decreased postural control, and decreased balance strategies.  Prior to hospitalization, patient was  CGA  with mobility and lived with Family in a [place] home.  Home access is 4 with rail in front and 2 without rail in backStairs to enter.  Patient will benefit from skilled PT intervention to maximize safe functional mobility, minimize fall risk, and decrease caregiver burden for planned discharge home with 24 hour supervision.  Anticipate patient will benefit from follow up [place] at discharge.  PT - End of Session Activity Tolerance: Tolerates 30+ min activity with multiple rests Endurance Deficit: Yes Endurance Deficit Description: frequent rest breaks during functional activity PT Assessment Rehab Potential (ACUTE/IP ONLY): Good PT Barriers to Discharge: Home environment access/layout PT Patient demonstrates impairments in the following area(s): Balance;Endurance;Motor;Safety;Sensory PT Transfers Functional Problem(s): Bed Mobility;Bed to Chair;Car;Furniture;Floor PT Locomotion Functional Problem(s): Ambulation;Wheelchair Mobility;Stairs PT Plan PT Intensity: Minimum of 1-2 x/day ,45 to 90 minutes PT Frequency: 5 out of 7 days PT Duration Estimated Length of Stay: 7-10 days PT Treatment/Interventions: Ambulation/gait training;Balance/vestibular training;Community reintegration;Discharge planning;Disease management/prevention;DME/adaptive equipment instruction;Functional mobility training;Neuromuscular re-education;Patient/family education;Psychosocial  support;Stair training;Therapeutic Activities;Therapeutic Exercise;UE/LE Strength taining/ROM;UE/LE Coordination activities PT Transfers Anticipated Outcome(s): Supervision PT Locomotion Anticipated Outcome(s): Supervision with LRAD PT Recommendation  Recommendations for Other Services: Neuropsych consult;Therapeutic Recreation consult Therapeutic Recreation Interventions: Stress management Follow Up Recommendations: Home health PT Patient destination: Home Equipment Recommended: Rolling walker with 5" wheels Equipment Details: TBD pending progress   PT Evaluation Precautions/Restrictions Precautions Precautions: Fall Precaution Comments: [place] BP /HR Restrictions Weight Bearing Restrictions: No Pain Interference Pain Interference Pain Effect on Sleep: 0. Does not apply - I have not had any pain or hurting in the past 5 days Pain Interference with Therapy Activities: 1. Rarely or not at all Pain Interference with Day-to-Day Activities: 1. Rarely or not at all Home Living/Prior [place] Available Help at Discharge: Family;Available 24 hours/day Type of Home: Apartment Home Access: Stairs to enter [employer] of Steps: 4 with rail in front and 2 without rail in back Entrance Stairs-Rails: Can reach both Home Layout: Two level;Bed/bath upstairs Alternate Level Stairs-Number of Steps: 6-8 Alternate Level Stairs-Rails: Right Additional Comments: per chart, pt unable to clarify home setup  Lives With: Family Prior Function Level of Independence: Needs assistance with gait;Needs assistance with tranfers  Able to Take Stairs?: Yes Driving: No Vision/Perception  Vision - History Baseline Vision: Wears glasses all the time Patient Visual Report: No change from baseline Perception Perception: Within Functional Limits Praxis Praxis: Intact  Cognition Overall Cognitive Status: Within Functional Limits for tasks assessed Arousal/Alertness:  Awake/alert Orientation Level: Oriented X4 Year: 2023 Attention: Focused;Sustained Focused Attention: Appears intact Sustained Attention: Appears intact Memory: Appears intact Awareness: Impaired Awareness Impairment: Anticipatory impairment Problem Solving: Impaired Safety/Judgment: Impaired Sensation Sensation Light Touch: Impaired Detail Light Touch Impaired Details: Impaired RLE;Impaired LLE (impaired distally) Proprioception: Impaired Detail Proprioception Impaired Details: Impaired RLE;Impaired LLE Coordination Gross Motor Movements are Fluid and Coordinated: No Fine Motor Movements are Fluid and Coordinated: No Coordination and Movement Description: ataxic limbs Heel Shin Test: impaired Bilaterlly, R>L Motor  Motor Motor: Abnormal tone;Ataxia;Abnormal postural alignment and control Motor - Skilled Clinical Observations: ataxic limbs, impaired control  Trunk/Postural Assessment  Cervical Assessment Cervical Assessment: Exceptions to [facility] (forward head) Thoracic Assessment Thoracic Assessment: Exceptions to [facility] (rounded shoulders) Lumbar Assessment Lumbar Assessment: Within Functional Limits Postural Control Postural Control: Deficits on evaluation Righting Reactions: delayed/insufficient  Balance Balance Balance Assessed: Yes Static Sitting Balance Static Sitting - Balance Support: No upper extremity supported;Feet supported Static Sitting - Level of Assistance: 5: Stand by assistance Dynamic Sitting Balance Dynamic Sitting - Balance Support: No upper extremity supported;Feet supported;During functional activity Dynamic Sitting - Level of Assistance: 5: Stand by assistance Static Standing Balance Static Standing - Balance Support: Bilateral upper extremity supported;During functional activity Static Standing - Level of Assistance: 4: Min assist Dynamic Standing Balance Dynamic Standing - Balance Support: Bilateral upper extremity supported;During functional  activity Dynamic Standing - Level of Assistance: 4: Min assist Extremity Assessment   RLE Assessment RLE Assessment: Within Functional Limits General Strength Comments: 4/5 grossly LLE Assessment LLE Assessment: Within Functional Limits General Strength Comments: 5/5 grossly  Care Tool Care Tool Bed Mobility Roll left and right activity   Roll left and right assist level: Supervision/Verbal cueing    Sit to lying activity   Sit to lying assist level: Supervision/Verbal cueing    Lying to sitting on side of bed activity   Lying to sitting on side of bed assist level: the ability to move from lying on the back to sitting on the side of the bed with no back support.: Supervision/Verbal cueing     Care Tool Transfers Sit to stand transfer   Sit to stand  assist level: Minimal Assistance - Patient > 75%    Chair/bed transfer   Chair/bed transfer assist level: Minimal Assistance - Patient > 75%     Toilet transfer   Assist Level: Minimal Assistance - Patient > 75%    Car transfer   Car transfer assist level: Minimal Assistance - Patient > 75%      Care Tool Locomotion Ambulation   Assist level: Minimal Assistance - Patient > 75% Assistive device: Walker-rolling Max distance: 90'  Walk 10 feet activity   Assist level: Minimal Assistance - Patient > 75% Assistive device: Walker-rolling   Walk 50 feet with 2 turns activity   Assist level: Minimal Assistance - Patient > 75% Assistive device: Walker-rolling  Walk 150 feet activity Walk 150 feet activity did not occur: Safety/medical concerns      Walk 10 feet on uneven surfaces activity Walk 10 feet on uneven surfaces activity did not occur: Safety/medical concerns      Stairs   Assist level: Moderate Assistance - Patient - 50 - 74% Stairs assistive device: 2 hand rails Max number of stairs: 4 (6")  Walk up/down 1 step activity   Walk up/down 1 step (curb) assist level: Moderate Assistance - Patient - 50 - 74% Walk  up/down 1 step or curb assistive device: 2 hand rails  Walk up/down 4 steps activity   Walk up/down 4 steps assist level: Moderate Assistance - Patient - 50 - 74% Walk up/down 4 steps assistive device: 2 hand rails  Walk up/down 12 steps activity Walk up/down 12 steps activity did not occur: Safety/medical concerns      Pick up small objects from floor Pick up small object from the floor (from standing position) activity did not occur: Safety/medical concerns      Wheelchair Is the patient using a wheelchair?:  (TBD)     Wheelchair assist level: Dependent - Patient 0% Max wheelchair distance: 150'  Wheel 50 feet with 2 turns activity   Assist Level: Dependent - Patient 0%  Wheel 150 feet activity   Assist Level: Dependent - Patient 0%    Refer to Care Plan for Long Term Goals  SHORT TERM GOAL WEEK 1 PT Short Term Goal 1 (Week 1): =LTG due to ELOS  Recommendations for other services: Neuropsych and Therapeutic Recreation  Stress management  Skilled Therapeutic Intervention Evaluation completed (see details above and below) with education on PT POC and goals and individual treatment initiated with focus on functional transfer and gait assessment. Pt received seated in bed, agreeable to PT evaluation. No complaints of pain. Bed mobility at Supervision level. Pt reports mild dizziness while seated EOB. Seated BP 130/86 while wearing THT. Pt requesting to use the bathroom. Sit to stand with min A to RW. Ambulatory transfer into bathroom with RW and min A for balance due to ataxic gait and flexed knees, assist needed for safe RW management. Toilet transfer with min A, assist needed for clothing management in standing. Seated BP following toilet transfer and short distance ambulation 140/95 with THT. Ambulation x 90 ft with RW and min A for balance with flexed trunk, ataxic BLE, B knees flexed, increased step length with RLE as compared to LLE before onset of fatigue. Car transfer with RW and  min A with cues for safe transfer technique. Ascend/descend 4 x 6" stairs with 2 handrails and mod A for balance, cues to step-to gait pattern due to significant B knee flexion noted during stair navigation. Seated BP 126/97 at end  of session. Pt left seated in w/c in room with needs in reach, quick release belt and chair alarm in place.  Mobility Bed Mobility Bed Mobility: Rolling Right;Rolling Left;Supine to Sit;Sit to Supine Rolling Right: Supervision/verbal cueing Rolling Left: Supervision/Verbal cueing Supine to Sit: Supervision/Verbal cueing Sit to Supine: Supervision/Verbal cueing Transfers Transfers: Sit to Stand;Stand Pivot Transfers Sit to Stand: Minimal Assistance - Patient > 75% Stand Pivot Transfers: Minimal Assistance - Patient > 75% Stand Pivot Transfer Details: Verbal cues for technique;Verbal cues for safe use of DME/AE Transfer (Assistive device): Rolling walker Locomotion  Gait Gait Distance (Feet): 90 Feet Assistive device: Rolling walker Gait Gait Pattern: Impaired (ataxic, inc step length with RLE, flexed knees) Gait velocity: decreased Stairs / Additional Locomotion Stairs: Yes Stairs Assistance: Moderate Assistance - Patient 50 - 74% Stair Management Technique: Two rails;Step to pattern Number of Stairs: 4 Height of Stairs: 6 Wheelchair Mobility Wheelchair Mobility: No   Discharge Criteria: Patient will be discharged from PT if patient refuses treatment 3 consecutive times without medical reason, if treatment goals not met, if there is a change in medical status, if patient makes no progress towards goals or if patient is discharged from hospital.  The above assessment, treatment plan, treatment alternatives and goals were discussed and mutually agreed upon: by patient   Excell Seltzer, PT, DPT, CSRS 10/25/2021, 12:19 PM

## 2021-10-25 NOTE — Plan of Care (Signed)
°  Problem: RH Balance Goal: LTG Patient will maintain dynamic standing balance (PT) Description: LTG:  Patient will maintain dynamic standing balance with assistance during mobility activities (PT) Flowsheets (Taken 10/25/2021 1225) LTG: Pt will maintain dynamic standing balance during mobility activities with:: Supervision/Verbal cueing   Problem: Sit to Stand Goal: LTG:  Patient will perform sit to stand with assistance level (PT) Description: LTG:  Patient will perform sit to stand with assistance level (PT) Flowsheets (Taken 10/25/2021 1225) LTG: PT will perform sit to stand in preparation for functional mobility with assistance level: Supervision/Verbal cueing   Problem: RH Bed Mobility Goal: LTG Patient will perform bed mobility with assist (PT) Description: LTG: Patient will perform bed mobility with assistance, with/without cues (PT). Flowsheets (Taken 10/25/2021 1225) LTG: Pt will perform bed mobility with assistance level of: Independent with assistive device    Problem: RH Bed to Chair Transfers Goal: LTG Patient will perform bed/chair transfers w/assist (PT) Description: LTG: Patient will perform bed to chair transfers with assistance (PT). Flowsheets (Taken 10/25/2021 1225) LTG: Pt will perform Bed to Chair Transfers with assistance level: Supervision/Verbal cueing   Problem: RH Car Transfers Goal: LTG Patient will perform car transfers with assist (PT) Description: LTG: Patient will perform car transfers with assistance (PT). Flowsheets (Taken 10/25/2021 1225) LTG: Pt will perform car transfers with assist:: Supervision/Verbal cueing   Problem: RH Ambulation Goal: LTG Patient will ambulate in controlled environment (PT) Description: LTG: Patient will ambulate in a controlled environment, # of feet with assistance (PT). Flowsheets (Taken 10/25/2021 1225) LTG: Pt will ambulate in controlled environ  assist needed:: Contact Guard/Touching assist LTG: Ambulation distance in  controlled environment: 150 ft with LRAD Goal: LTG Patient will ambulate in home environment (PT) Description: LTG: Patient will ambulate in home environment, # of feet with assistance (PT). Flowsheets (Taken 10/25/2021 1225) LTG: Pt will ambulate in home environ  assist needed:: Contact Guard/Touching assist LTG: Ambulation distance in home environment: 75 ft with LRAD   Problem: RH Stairs Goal: LTG Patient will ambulate up and down stairs w/assist (PT) Description: LTG: Patient will ambulate up and down # of stairs with assistance (PT) Flowsheets (Taken 10/25/2021 1225) LTG: Pt will ambulate up/down stairs assist needed:: Minimal Assistance - Patient > 75% LTG: Pt will  ambulate up and down number of stairs: 8 stairs with one handrail per home setup

## 2021-10-25 NOTE — Progress Notes (Signed)
PROGRESS NOTE   Subjective/Complaints: No new complaints this morning Working with [facility] Abdominal binder ordered May d/c IV  ROS: denies pain   Objective:   No results found. No results for input(s): WBC, HGB, HCT, PLT in the last 72 hours. Recent Labs    10/23/21 0346  NA 137  K 4.2  CL 106  CO2 24  GLUCOSE 135*  BUN 21*  CREATININE 0.92  CALCIUM 8.7*    Intake/Output Summary (Last 24 hours) at 10/25/2021 1410 Last data filed at 10/25/2021 0900 Gross per 24 hour  Intake 420 ml  Output 425 ml  Net -5 ml        Physical Exam: Vital Signs Blood pressure 110/80, pulse 84, temperature 97.9 F (36.6 C), temperature source Oral, resp. rate 18, height 5\' 9"  (1.753 m), weight 53.9 kg, SpO2 97 %. Gen: no distress, normal appearing, BMI 17.55 HEENT: oral mucosa pink and moist, NCAT Cardio: Normal rate Chest: normal effort, normal rate of breathing Abd: soft, non-distended Ext: no edema Psych: pleasant, flat affect Skin: intact Neuro/Musculoskeletal: Alert, 5/5 strength throughout with the exception of 4/5 bilateral PF/DF. Sensation intact.      Assessment/Plan: 1. Functional deficits which require 3+ hours per day of interdisciplinary therapy in a comprehensive inpatient rehab setting. Physiatrist is providing close team supervision and 24 hour management of active medical problems listed below. Physiatrist and rehab team continue to assess barriers to discharge/monitor patient progress toward functional and medical goals  Care Tool:  Bathing  Bathing activity did not occur:  (not assessed due to time constraints)           Bathing assist       Upper Body Dressing/Undressing Upper body dressing   What is the patient wearing?: Pull over shirt    Upper body assist Assist Level: Supervision/Verbal cueing    Lower Body Dressing/Undressing Lower body dressing      What is the patient  wearing?: Pants     Lower body assist Assist for lower body dressing: Minimal Assistance - Patient > 75%     Toileting Toileting    Toileting assist Assist for toileting: Minimal Assistance - Patient > 75%     Transfers Chair/bed transfer  Transfers assist     Chair/bed transfer assist level: Minimal Assistance - Patient > 75%     Locomotion Ambulation   Ambulation assist      Assist level: Minimal Assistance - Patient > 75% Assistive device: Walker-rolling Max distance: 90'   Walk 10 feet activity   Assist     Assist level: Minimal Assistance - Patient > 75% Assistive device: Walker-rolling   Walk 50 feet activity   Assist    Assist level: Minimal Assistance - Patient > 75% Assistive device: Walker-rolling    Walk 150 feet activity   Assist Walk 150 feet activity did not occur: Safety/medical concerns         Walk 10 feet on uneven surface  activity   Assist Walk 10 feet on uneven surfaces activity did not occur: Safety/medical concerns         Wheelchair     Assist Is the patient using a wheelchair?:  (  TBD)      Wheelchair assist level: Dependent - Patient 0% Max wheelchair distance: 150'    Wheelchair 50 feet with 2 turns activity    Assist        Assist Level: Dependent - Patient 0%   Wheelchair 150 feet activity     Assist      Assist Level: Dependent - Patient 0%   Blood pressure 110/80, pulse 84, temperature 97.9 F (36.6 C), temperature source Oral, resp. rate 18, height 5\' 9"  (1.753 m), weight 53.9 kg, SpO2 97 %.  Medical Problem List and Plan: 1. Functional deficits secondary to multiple sclerosis flare and lower extremity weakness             -patient may shower             -ELOS/Goals: 5-7 days S             Admit to CIR 2.  Antithrombotics: -DVT/anticoagulation:  Pharmaceutical: Lovenox             -antiplatelet therapy: None 3. Pain Management: Tylenol, baclofen as needed 4. Mood: LCSW  to evaluate and provide emotional support. Continue Cymbalta             -antipsychotic agents: n/a 5. Neuropsych: This patient is capable of making decisions on his own behalf. 6. Skin/Wound Care: Routine skin care checks 7. Fluids/Electrolytes/Nutrition: Routine Is and Os and follow-up chemistries 8: Autism spectrum disorder 9: Depression: Continue Cymbalta.  10: Underweight: BMI 17.55: encourage adequate nutrition. 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   12. Severe vitamin D deficiency: start ergocalciferol 50,000U once per week for 7 weeks. 13. Hyperglycemia: HgbA1c is 5.3. Educated regarding avoiding added sugar in diet.  14. Hypotension: abdominal binder ordered  LOS: 1 days A FACE TO FACE EVALUATION WAS PERFORMED  Jaime [name] Jaime [name] 10/25/2021, 2:10 PM

## 2021-10-26 NOTE — Care Management (Signed)
[facility]patient Rehabilitation Center Individual Statement of Services  Patient Name:  JACAI KIPP  Date:  10/26/2021  Welcome to the [facility]patient Rehabilitation [facility].  Our goal is to provide you with an individualized program based on your diagnosis and situation, designed to meet your specific needs.  With this comprehensive rehabilitation program, you will be expected to participate in at least 3 hours of rehabilitation therapies Monday-Friday, with modified therapy programming on the weekends.  Your rehabilitation program will include the following services:  Physical Therapy (PT), Occupational Therapy (OT), 24 hour per day rehabilitation nursing, Therapeutic Recreaction (TR), Psychology, Neuropsychology, Care Coordinator, Rehabilitation Medicine, Nutrition Services, Pharmacy Services, and Other  Weekly team conferences will be held on Tuesdays to discuss your progress.  Your Inpatient Rehabilitation Care Coordinator will talk with you frequently to get your input and to update you on team discussions.  Team conferences with you and your family in attendance may also be held.  Expected length of stay: 7-10 days    Overall anticipated outcome: Supervision  Depending on your progress and recovery, your program may change. Your Inpatient Rehabilitation Care Coordinator will coordinate services and will keep you informed of any changes. Your Inpatient Rehabilitation Care Coordinator's name and contact numbers are listed  below.  The following services may also be recommended but are not provided by the Inpatient Rehabilitation Center:  Driving Evaluations Home Health Rehabiltiation Services Outpatient Rehabilitation Services Vocational Rehabilitation   Arrangements will be made to provide these services after discharge if needed.  Arrangements include referral to agencies that provide these services.  Your insurance has been verified to be:  Medicaid  Your primary doctor is:  Delbert Harness  Pertinent information will be shared with your doctor and your insurance company.  Inpatient Rehabilitation Care Coordinator:  Susie Cassette [phone] or ([phone]  Information discussed with and copy given to patient by: Gretchen Short, 10/26/2021, 9:37 AM

## 2021-10-26 NOTE — Progress Notes (Signed)
Occupational Therapy Session Note  Patient Details  Name: Jaime [name] MRN: [id number] Date of Birth: 1996/03/12  Today's Date: 10/26/2021 OT Individual Time: 4503-8882 OT Individual Time Calculation (min): 54 min   OT Individual Time: 1301-1336 OT Individual Time Calculation (min): 35 min    Short Term Goals: Week 1:  OT Short Term Goal 1 (Week 1): STGs=LTGs due to ELOS  Skilled Therapeutic Interventions/Progress Updates:  Session 1: Patient met lying supine in bed asleep but easily awoken. Flat affect and increased time to respond to this writers questions throughout (likely baseline). Patient in agreement with OT treatment session. 0/10 pain reported at rest and with activity. Patient in agreement with session with focus on ADLs, functional tranfers and mobility. Supine to EOB with increased time secondary to ataxia in all 4 extremities. BP assessed with systolic in 800L and diastolic in 49'Z. Did not don TED hose 2/2 BP. RN made aware. Sit to stand from EOB with cues for hand placement and walker management. Mobility to commode in bathroom with increased time and use of RW with cues as previously indicated. Patient completed 3/3 parts of toileting task with Min guard. Walk-in shower transfer with Min A and cues for safety. Patient then completed UB/LB bathing in sitting. UB dressing with set-up assist and LB dressing with Min A to don BLE. Decreased dynamic sitting balance with fatigue. Session concluded with patient seated in recliner with call bell within reach, chair alarm activated and all needs met.   Session 2: Patient met seated in recliner in agreement with short OT treatment session. 0/10 pain reported at rest and with activity. Vitals assessed. BP soft despite TED hose and abdominal binder in place and HR elevated. RN aware. Session with focus on functional mobility, dynamic standing balance and activity tolerance. Functional mobility to rehab gym on 5c with use of RW and CGA.  Cues for walker management. Patient engaged in connect-4 game in standing initially. Some difficulty with reaching outside of BOS without lateral/anterior LOB. Patient with request to complete activity in sitting after. This writer obliged with focus shifting to dynamic sitting balance. Return to hospital room in same manner as indicated above. Session concluded with patient seated in recliner with call bell within reach, chair alarm activated and all needs met.   Therapy Documentation Precautions:  Precautions Precautions: Fall Precaution Comments: Montior BP /HR Restrictions Weight Bearing Restrictions: No General:    Therapy/Group: Individual Therapy  Santanna Whitford R Howerton-Davis 10/26/2021, 6:46 AM

## 2021-10-26 NOTE — Progress Notes (Signed)
Inpatient Rehabilitation  Patient information reviewed and entered into eRehab system by Scottie Stanish Bhavya Grand, OTR/L.   Information including medical coding, functional ability and quality indicators will be reviewed and updated through discharge.    

## 2021-10-26 NOTE — Progress Notes (Signed)
Physical Therapy Session Note  Patient Details  Name: Jaime [name] MRN: [id number] Date of Birth: [birth year]  Today's Date: 10/26/2021 PT Individual Time: 2694-8546 PT Individual Time Calculation (min): 69 min   Short Term Goals: Week 1:  PT Short Term Goal 1 (Week 1): =LTG due to ELOS Week 2:    Week 3:     Skilled Therapeutic Interventions/Progress Updates:    Pt initially oob in recliner.  Denies pain. Therapist assisted pt w/donning TED hose in sitting.  Pt able to don socks w/set up and additional time. BP initially in sitting 115/82 Sit to stand w/cga, min  assist for static stand without AD Standing - c/o mild dizzyness, returned to sitting and abdominal binder applied. BP 128/80 Short distance gait to sink w/mod assist no AD. Standing at sink 115/70 HR 144 - suspect dynamap inaccurate Pt brushed teeth w/set up assist, cga w/sink for support. Short distance gait to recliner w/mod assist Sitting in recliner  HR120.  Gait 131ft w/cga and cues for maintaining safe distance from walker, tends to get too close to front w/mild post tendency, cues for managing walker, mildly crouched gait, ataxic, occasional very narrow base which he corrects w/cueing.  Pt uses semi step thru gait pattern, but pauses between each set of steps, advances walker then repeats/nonfluid gait.   HR folowing gait 120 BP 126/85  Pt c/o need for BM.  Gait 10-31ft plus turn/sit to commode w/min assist to cga and cues for safety.  Pt unable to pass bowel movement.  Sit to stand to RW, raises pants w/min assist.    Therex: Repeated Sit to stand + alternating arm raise x 1 each in standing, no AD x 10 rounds w/cga Stangind w/RW support wide lateral stepping x 10 sets Stading w/RW alternating forward/reverse long step x 10 reps  BP 126/82  Gait x 153ft as above.   Pt transfers to recliner w/RW and cga. Pt left oob in recliner w/chair alarm set and needs in reach.     Therapy  Documentation Precautions:  Precautions Precautions: Fall Precaution Comments: Montior BP /HR Restrictions Weight Bearing Restrictions: No     Therapy/Group: Individual Therapy Rada Hay, PT   Shearon Balo 10/26/2021, 10:58 AM

## 2021-10-26 NOTE — Progress Notes (Signed)
Physical Therapy Session Note  Patient Details  Name: Jaime [name] MRN: [id number] Date of Birth: 16-Jul-1996  Today's Date: 10/26/2021 PT Individual Time: 1445-1530 PT Individual Time Calculation (min): 45 min   Short Term Goals: Week 1:  PT Short Term Goal 1 (Week 1): =LTG due to ELOS  Skilled Therapeutic Interventions/Progress Updates:    Pt received seated in recliner in room, agreeable to PT session. No complaints of pain. Seated BP 112/79 while wearing THT and abdominal binder. Sit to stand with CGA to RW. Ambulation x 135 ft, x 100 ft, x 35 ft with RW and min A for balance. Pt exhibits improved overall balance during gait this date, with onset of fatigue does exhibit increase in BLE ataxia and uneven step length. Seated BP following gait 117/85 with no symptoms of dizziness during gait. Standing balance and LE coordination task attempting alt L/R cone taps, pt exhibits difficulty maintaining standing balance with RW and accurately hitting target, transitioned to 3" step-taps with RW and CGA for balance. Pt exhibits increase in BLE ataxia and posterior lean and trunk flexion with onset of fatigue. Toilet transfer with RW and CGA, pt is CGA for standing balance during clothing management. Pt is close Supervision for standing balance while washing hands at sink utilizing hips against sink for stability. Pt returned to recliner and left seated in chair with needs in reach, chair alarm in place at end of session.  Therapy Documentation Precautions:  Precautions Precautions: Fall Precaution Comments: Montior BP /HR Restrictions Weight Bearing Restrictions: No      Therapy/Group: Individual Therapy   Peter Congo, PT, DPT, CSRS  10/26/2021, 5:11 PM

## 2021-10-26 NOTE — Progress Notes (Signed)
Inpatient Rehabilitation Care Coordinator Assessment and Plan Patient Details  Name: Jaime [name] MRN: [id number] Date of Birth: May 19, 1996  Today's Date: 10/26/2021  Hospital Problems: Principal Problem:   Multiple sclerosis exacerbation [facility])  Past Medical History:  Past Medical History:  Diagnosis Date   Autism    Depression    MS (multiple sclerosis) (HCC)    Past Surgical History:  Past Surgical History:  Procedure Laterality Date   NO PAST SURGERIES     Social History:  reports that he has never smoked. He has never used smokeless tobacco. He reports that he does not drink alcohol and does not use drugs.  Family / Support Systems Marital Status: Single Spouse/Significant Other: N/A Children: No children Other Supports: None Anticipated Caregiver: mother and siblings Ability/Limitations of Caregiver: Pt primary caregiver will be [name] [phone] Caregiver Availability: 24/7 Family Dynamics: Pt lives with his mother and two younger siblings.  Social History Preferred language: English Religion:  Cultural Background: Pt graduated high school Education: high school grad Primary school teacher - How often do you need to have someone help you when you read instructions, pamphlets, or other written material from your doctor or pharmacy?: Rarely Writes: Yes Employment Status: Disabled Date Retired/Disabled/Unemployed: since a toddler due to autism spectrum disorder Marine scientist Issues: Denies Guardian/Conservator: N/A   Abuse/Neglect Abuse/Neglect Assessment Can Be Completed: Yes Physical Abuse: Denies Verbal Abuse: Denies Sexual Abuse: Denies Exploitation of patient/patient's resources: Denies Self-Neglect: Denies  Patient response to: Social Isolation - How often do you feel lonely or isolated from those around you?: Never  Emotional Status Pt's affect, behavior and adjustment status: Pt in good spirits at time of visit Recent  Psychosocial Issues: Denies Psychiatric History: Denies Substance Abuse History: Denies  Patient / Family Perceptions, Expectations & Goals Pt/Family understanding of illness & functional limitations: Pt mother has a general understanding of pt care needs Premorbid pt/family roles/activities: Independent with self-care; assistance with cognition Anticipated changes in roles/activities/participation: Assistance with ADLs/IADLs Pt/family expectations/goals: Pt goal is to be independent as possible  [employer]: None Premorbid Home Care/DME Agencies: None Transportation available at discharge: Mother Is the patient able to respond to transportation needs?: Yes In the past 12 months, has lack of transportation kept you from medical appointments or from getting medications?: No In the past 12 months, has lack of transportation kept you from meetings, work, or from getting things needed for daily living?: No Resource referrals recommended: Neuropsychology  Discharge Planning Living Arrangements: Parent, Other relatives Support Systems: Parent, Other relatives Type of Residence: Private residence Insurance Resources: Medicaid (specify county) ([place]) Surveyor, quantity Resources: [employer] Financial Screen Referred: No Living Expenses: Lives with family Money Management: Family Does the patient have any problems obtaining your medications?: No Home Management: Pt helps manage some homecare needs at home. Patient/Family Preliminary Plans: No changes Care Coordinator Barriers to Discharge: Decreased caregiver support, Lack of/limited family support, Insurance for SNF coverage Care Coordinator Anticipated Follow Up Needs: HH/OP Expected length of stay: 7-10 days  Clinical Impression SW familiar with pt due to previous admissions. Pt is not a Cytogeneticist. HCPOA is is [name]. DME: RW and wheelchair.   1345-SW spoke with pt mother Jaime [name] to inform on ELOS and will  f/u tomorrow after team conference.   Josuha Fontanez A Jamilynn Whitacre 10/26/2021, 2:08 PM

## 2021-10-26 NOTE — Discharge Summary (Signed)
Physician Discharge Summary  Patient ID: Jaime [name] MRN: [id number] DOB/AGE: 26/27/1997 25 y.o.  Admit date: 10/24/2021 Discharge date: 11/03/2021  Discharge Diagnoses:  Principal Problem:   Multiple sclerosis exacerbation (HCC)  Active problems: Functional deficits secondary to MS flare Vitamin D deficiency Depression Autism spectrum disorder Underweight Hyperglycemia Hypotension Tremors  Discharged Condition: good  Significant Diagnostic Studies: DG Chest 2 View  Result Date: 10/18/2021 CLINICAL DATA:  MS flair EXAM: CHEST - 2 VIEW COMPARISON:  Chest x-ray 05/30/2020 FINDINGS: The heart and mediastinal contours are within normal limits. No focal consolidation. No pulmonary edema. No pleural effusion. No pneumothorax. No acute osseous abnormality. IMPRESSION: No active cardiopulmonary disease. Electronically Signed   By: Tish Frederickson M.D.   On: 10/18/2021 22:20   CT Head Wo Contrast  Result Date: 10/18/2021 CLINICAL DATA:  Head trauma. History of multiple sclerosis. Multiple falls. EXAM: CT HEAD WITHOUT CONTRAST TECHNIQUE: Contiguous axial images were obtained from the base of the skull through the vertex without intravenous contrast. RADIATION DOSE REDUCTION: This exam was performed according to the departmental dose-optimization program which includes automated exposure control, adjustment of the mA and/or kV according to patient size and/or use of iterative reconstruction technique. COMPARISON:  None. FINDINGS: Brain: There is no mass, hemorrhage or extra-axial collection. The size and configuration of the ventricles and extra-axial CSF spaces are normal. The brain parenchyma is normal, without acute or chronic infarction. Vascular: No abnormal hyperdensity of the major intracranial arteries or dural venous sinuses. No intracranial atherosclerosis. Skull: The visualized skull base, calvarium and extracranial soft tissues are normal. Sinuses/Orbits: No fluid levels or  advanced mucosal thickening of the visualized paranasal sinuses. No mastoid or middle ear effusion. The orbits are normal. IMPRESSION: Normal head CT. Electronically Signed   By: Deatra Robinson M.D.   On: 10/18/2021 21:06   MR BRAIN W WO CONTRAST  Result Date: 10/19/2021 CLINICAL DATA:  Multiple sclerosis. Bilateral leg weakness, slurred speech, lack of coordination, and left-sided facial droop. EXAM: MRI HEAD WITHOUT AND WITH CONTRAST MRI CERVICAL SPINE WITHOUT AND WITH CONTRAST TECHNIQUE: Multiplanar, multiecho pulse sequences of the brain and surrounding structures, and cervical spine, to include the craniocervical junction and cervicothoracic junction, were obtained without and with intravenous contrast. CONTRAST:  5mL GADAVIST GADOBUTROL 1 MMOL/ML IV SOLN COMPARISON:  Head CT 10/18/2021. Head MRI 04/07/2021. Cervical spine MRI 07/15/2020. FINDINGS: MRI HEAD FINDINGS Brain: There is no evidence of an acute infarct, intracranial hemorrhage, mass, midline shift, or extra-axial fluid collection. The ventricles and sulci are normal. Numerous foci of T2 FLAIR hyperintensity are again seen throughout the juxtacortical, deep, and periventricular cerebral white matter bilaterally as well as in the corpus callosum, brainstem, and cerebellum. There are multiple new supratentorial lesions bilaterally with examples annotated with arrows on series 11. Postcontrast sequences are moderately motion degraded, however there are at least 9 enhancing supratentorial lesions which are annotated on series 19, involving both cerebral hemispheres and the posterior body of the corpus callosum. Vascular: Major intracranial vascular flow voids are preserved. Skull and upper cervical spine: Unremarkable bone marrow signal. Sinuses/Orbits: Unremarkable orbits. Minimal mucosal thickening in the paranasal sinuses. Clear mastoid air cells. Other: None. MRI CERVICAL SPINE FINDINGS Alignment: Chronic straightening of the normal cervical  lordosis. No listhesis. Vertebrae: No fracture, suspicious marrow lesion, or significant marrow edema. Cord: Widespread T2 hyperintense lesions throughout the cervical and included upper thoracic spinal cord. Assessment for interval change is limited by motion artifact on the prior MRI, however midline lesions at C4-5,  C5-6, and C7 are either new or larger. Within limitations of motion artifact, no enhancing lesions are evident today. Posterior Fossa, vertebral arteries, paraspinal tissues: Posterior fossa more fully evaluated on the head MRI. Preserved vertebral artery flow voids. Disc levels: Preserved disc space heights. No sizable disc herniation or stenosis. IMPRESSION: 1. Extensive multiple sclerosis with multiple new lesions in the brain and spinal cord. 2. At least 9 enhancing supratentorial lesions consistent with active demyelination. 3. No definite enhancing lesions in the cervical spine. Electronically Signed   By: Sebastian Ache M.D.   On: 10/19/2021 08:27   MR Cervical Spine W or Wo Contrast  Result Date: 10/19/2021 CLINICAL DATA:  Multiple sclerosis. Bilateral leg weakness, slurred speech, lack of coordination, and left-sided facial droop. EXAM: MRI HEAD WITHOUT AND WITH CONTRAST MRI CERVICAL SPINE WITHOUT AND WITH CONTRAST TECHNIQUE: Multiplanar, multiecho pulse sequences of the brain and surrounding structures, and cervical spine, to include the craniocervical junction and cervicothoracic junction, were obtained without and with intravenous contrast. CONTRAST:  60mL GADAVIST GADOBUTROL 1 MMOL/ML IV SOLN COMPARISON:  Head CT 10/18/2021. Head MRI 04/07/2021. Cervical spine MRI 07/15/2020. FINDINGS: MRI HEAD FINDINGS Brain: There is no evidence of an acute infarct, intracranial hemorrhage, mass, midline shift, or extra-axial fluid collection. The ventricles and sulci are normal. Numerous foci of T2 FLAIR hyperintensity are again seen throughout the juxtacortical, deep, and periventricular cerebral  white matter bilaterally as well as in the corpus callosum, brainstem, and cerebellum. There are multiple new supratentorial lesions bilaterally with examples annotated with arrows on series 11. Postcontrast sequences are moderately motion degraded, however there are at least 9 enhancing supratentorial lesions which are annotated on series 19, involving both cerebral hemispheres and the posterior body of the corpus callosum. Vascular: Major intracranial vascular flow voids are preserved. Skull and upper cervical spine: Unremarkable bone marrow signal. Sinuses/Orbits: Unremarkable orbits. Minimal mucosal thickening in the paranasal sinuses. Clear mastoid air cells. Other: None. MRI CERVICAL SPINE FINDINGS Alignment: Chronic straightening of the normal cervical lordosis. No listhesis. Vertebrae: No fracture, suspicious marrow lesion, or significant marrow edema. Cord: Widespread T2 hyperintense lesions throughout the cervical and included upper thoracic spinal cord. Assessment for interval change is limited by motion artifact on the prior MRI, however midline lesions at C4-5, C5-6, and C7 are either new or larger. Within limitations of motion artifact, no enhancing lesions are evident today. Posterior Fossa, vertebral arteries, paraspinal tissues: Posterior fossa more fully evaluated on the head MRI. Preserved vertebral artery flow voids. Disc levels: Preserved disc space heights. No sizable disc herniation or stenosis. IMPRESSION: 1. Extensive multiple sclerosis with multiple new lesions in the brain and spinal cord. 2. At least 9 enhancing supratentorial lesions consistent with active demyelination. 3. No definite enhancing lesions in the cervical spine. Electronically Signed   By: Sebastian Ache M.D.   On: 10/19/2021 08:27    Labs:  Basic Metabolic Panel: No results for input(s): NA, K, CL, CO2, GLUCOSE, BUN, CREATININE, CALCIUM, MG, PHOS in the last 168 hours.   CBC: No results for input(s): WBC, NEUTROABS,  HGB, HCT, MCV, PLT in the last 168 hours.   CBG: No results for input(s): GLUCAP in the last 168 hours.   Brief HPI:   Jaime [name] is a 26 y.o. male who presented to the ED complaining of increasing BLE weakness over the course of the last few weeks. He has history of MS with Tysabri treatments. He complained of slurred speech and facial drooping. CT head unremarkable. MRI  brain with new demyelinating lesions. Neurology consulted and treated with high-dose steroids.   Hospital Course: Jaime [name] was admitted to rehab 10/24/2021 for inpatient therapies to consist of PT, ST and OT at least three hours five days a week. Past admission physiatrist, therapy team and rehab RN have worked together to provide customized collaborative inpatient rehab. Found to be vitamin D deficient and supplementation ordered. Elevated blood sugars noted and educated regarding avoiding added sugar in diet. Complained of generalized extremity tremors.  Advised to discuss with neurologist to manage chronically. Orthostatic hypotension resolved with abdominal binder and TED hose. Midodrine resumed from home med for autonomic orthostatic hypotension on 1/24. Baclofen continued for MS related spacticity. Patient required frequent reminders to eat.  Urinary urgency with intermittent incontinence due to MS noted on 1/27. Incontinence supplies ordered for home use.   Blood pressures were monitored on TID basis and abdominal binder was used when out of bed. Midodrine resumed from home med for autonomic orthostatic hypotension on 1/24.  Rehab course: During patient's stay in rehab weekly team conferences were held to monitor patient's progress, set goals and discuss barriers to discharge. At admission, patient required sit to stand with CGA, min assist for static standing; mod A when ambulating  He/She  has had improvement in activity tolerance, balance, postural control as well as ability to compensate for deficits.  He/She has had improvement in functional use RUE/LUE  and RLE/LLE as well as improvement in awareness    Disposition: Home Discharge disposition: 01-Home or Self Care        Diet: Regular  Special Instructions:  No driving, alcohol consumption or tobacco use.   30-35 minutes were spent on discharge planning and discharge summary.  Discharge Instructions     Ambulatory referral to Neurology   Complete by: As directed    An appointment is requested in approximately: 2 weeks. Follow-up new onset tremors; history of MS with recent flare   Ambulatory referral to Occupational Therapy   Complete by: As directed    Eval and treat   Ambulatory referral to Physical Medicine Rehab   Complete by: As directed    Recent hosptialization for MS flare. Patient has appointment arranged for 12/25/2021 in system.   Ambulatory referral to Physical Therapy   Complete by: As directed    Eval and treat   Discharge patient   Complete by: As directed    Discharge disposition: 01-Home or Self Care   Discharge patient date: 11/03/2021      Allergies as of 11/03/2021   No Known Allergies      Medication List     TAKE these medications    acetaminophen 500 MG tablet Commonly known as: TYLENOL Take 1,000 mg by mouth every 6 (six) hours as needed for headache (pain).   Baclofen 5 MG Tabs Take 5 mg by mouth 3 (three) times daily.   docusate sodium 100 MG capsule Commonly known as: COLACE Take 1 capsule (100 mg total) by mouth 3 (three) times daily. What changed:  when to take this reasons to take this   DULoxetine 60 MG capsule Commonly known as: Cymbalta Take 1 capsule (60 mg total) by mouth daily. What changed:  when to take this reasons to take this   midodrine 10 MG tablet Commonly known as: PROAMATINE Take 1 tablet (10 mg total) by mouth 2 (two) times daily with a meal.   ondansetron 4 MG tablet Commonly known as: Zofran Take 1 tablet (4 mg total)  by mouth every 8  (eight) hours as needed for nausea or vomiting. What changed:  when to take this reasons to take this   Vitamin D (Ergocalciferol) 1.25 MG (50000 UNIT) Caps capsule Commonly known as: DRISDOL Take 1 capsule (50,000 Units total) by mouth every 7 (seven) days for 3 days. Start taking on: November 08, 2021        Follow-up Information     Lovorn, Aundra Millet, MD. Go on 12/25/2021.   Specialty: Physical Medicine and Rehabilitation Why: at 1:00 pm Contact information: [address] [place] [address] [phone]         Macy Mis, MD Follow up.   Specialty: Family Medicine Why: Call on Wednesday for hospital follow-up appointment Contact information: [address] [place] [address] [phone]         Asa Lente, MD Follow up.   Specialty: Neurology Why: Call on Wednesday for hospital follow-up appointment and evaluation of tremors Contact information: [address] [place] [address] [phone]                 Signed: Milinda Antis 11/03/2021, 3:50 PM

## 2021-10-27 MED ORDER — MIDODRINE HCL 5 MG PO TABS
5.0000 mg | ORAL_TABLET | Freq: Three times a day (TID) | ORAL | Status: DC
  Administered 2021-10-27 – 2021-10-29 (×8): 5 mg via ORAL
  Filled 2021-10-27 (×9): qty 1

## 2021-10-27 NOTE — Progress Notes (Signed)
Patient ID: AEDIN JEANSONNE, male   DOB: November 10, 1995, 26 y.o.   MRN: [id number]  SW provided pt mother with updates from team conference, and d/c date 1/31. Fam edu scheduled for Monday (1/30) 9am-11am with her and his brother. She asked for briefs for pt to d/c to home with due to urgency.   *SW met with pt in room to inform on d/c date, and encouraged to inform team when he is feeling dizzy (as reported in team conference today unsure when it occurs).   Loralee Pacas, MSW, [place] Office: [phone] Cell: [phone] Fax: [phone]

## 2021-10-27 NOTE — Progress Notes (Signed)
Occupational Therapy Session Note  Patient Details  Name: Jaime [name] MRN: [id number] Date of Birth: 05-28-1996  Today's Date: 10/27/2021 OT Individual Time: 1962-2297 OT Individual Time Calculation (min): 25 min    Short Term Goals: Week 1:  OT Short Term Goal 1 (Week 1): STGs=LTGs due to ELOS  Skilled Therapeutic Interventions/Progress Updates:    Pt in recliner to start.  BP in sitting with abdominal binder in place at 121/88.  Also took one in standing with use of the RW for support and min assist.  BP at 113/89.  Minimal dizziness reported with standing.  Had him work on standing with use of the RW initially to stand while releasing each UE at a time.  Increased LOB to the left noted when releasing the LUE.  Next, had him stand without UE support and mod assist.  When given instructions to move feet closer together since he had them too far apart, he instead stepped forward.  When told to straighten his knees, he instead flexed them.  After brief rest in sitting, had him complete functional mobility to the door of his room with mod assist and no device.  Pt with increased ataxia and greater step length then what would be safe at his current level.  Max instructional cueing to take smaller steps.  Finished session with pt sitting in the wheelchair and with the call button and phone in reach.  Safety alarm pad in place.    Therapy Documentation Precautions:  Precautions Precautions: Fall Precaution Comments: Montior BP /HR Restrictions Weight Bearing Restrictions: No  Pain: Pain Assessment Pain Scale: Faces Pain Score: 0-No pain   Therapy/Group: Individual Therapy  Lexie Koehl OTR/L 10/27/2021, 12:40 PM

## 2021-10-27 NOTE — Patient Care Conference (Signed)
Inpatient RehabilitationTeam Conference and Plan of Care Update Date: 10/27/2021   Time: 11:08 AM    Patient Name: Jaime [name]      Medical Record Number: [id number]  Date of Birth: Oct 04, 1996 Sex: Male         Room/Bed: 5C21C/5C21C-01 Payor Info: Payor: MEDICAID [place] / Plan: MEDICAID [place] ACCESS / Product Type: *No Product type* /    Admit Date/Time:  10/24/2021  3:24 PM  Primary Diagnosis:  Multiple sclerosis exacerbation [facility])  Hospital Problems: Principal Problem:   Multiple sclerosis exacerbation [facility])    Expected Discharge Date: Expected Discharge Date: 11/03/21  Team Members Present: Physician leading conference: Dr. Genice Rouge Social Worker Present: Cecile Sheerer, LCSWA Nurse Present: Chana Bode, RN PT Present: Peter Congo, PT OT Present: Ardis Rowan, COTA PPS Coordinator present : Fae Pippin, SLP     Current Status/Progress Goal Weekly Team Focus  Bowel/Bladder   Pt is continent of bowel/bladder  Pt will remain continent of bowel/bladder  Will assess qshift and PRN   Swallow/Nutrition/ Hydration             ADL's   bathing-CGA; LB dressing-min A: functional trnsfers-min A/CGA; toileting-min A  overall supervision  BADLs, activity tolerance, standing balance, safety awareness   Mobility   min A overall with RW, gait up to 90 ft with RW min A, mod A stairs  Supervision overall, CGA gait, min A stairs  balance, endurance, LE strengthening and coordination, safety   Communication             Safety/Cognition/ Behavioral Observations            Pain   Pt is pain free  Pt will remain pain free  Will assess qshift and PRN   Skin   Pt's skin is intact  Pt's skin will remain intact  Will assess qshift and PRN     Discharge Planning:   Mother reports plans for moving to a new home   Team Discussion: MD question NMO without symptoms at present; third admission for MS flare. Patient with poor appetite and slow with meals. MD to restart low  dose midodrine; although asymptomatic with Thigh high TEDs and binder on. Spasticity does not affect mobility at present.  Patient on target to meet rehab goals: yes, currently CGA for transfers and min assist for gait due to ataxia. CGA for bathing and min assist for lower body ADLs. Able to ambulate with CGA- Min assist.   *See Care Plan and progress notes for long and short-term goals.   Revisions to Treatment Plan:  N/A   Teaching Needs: Safety, medication management, transfers, toileting, etc  Current Barriers to Discharge: Decreased caregiver support and Home enviroment access/layout  Possible Resolutions to Barriers: Family education     Medical Summary Current Status: continent- no pain issues- no skin issues-  Barriers to Discharge: Behavior;Weight;Nutrition means;Decreased family/caregiver support;Home enviroment access/layout;Medical stability  Barriers to Discharge Comments: moving to home wiht no stairs soon- Possible Resolutions to [employer] Focus: really ataxic- goals CGA-supervison- strength has improved- ataxia main limiters as well as autism/ and orthostatic hypotension- really slow to do tasks- 1/31 d/c   Continued Need for Acute Rehabilitation Level of Care: The patient requires daily medical management by a physician with specialized training in physical medicine and rehabilitation for the following reasons: Direction of a multidisciplinary physical rehabilitation program to maximize functional independence : Yes Medical management of patient stability for increased activity during participation in an intensive rehabilitation regime.: Yes  Analysis of laboratory values and/or radiology reports with any subsequent need for medication adjustment and/or medical intervention. : Yes   I attest that I was present, lead the team conference, and concur with the assessment and plan of the team.   Chana Bode B 10/27/2021, 3:17 PM

## 2021-10-27 NOTE — Progress Notes (Signed)
Physical Therapy Session Note  Patient Details  Name: Jaime [name] MRN: [id number] Date of Birth: 18-Jun-1996  Today's Date: 10/27/2021 PT Individual Time: 1300-1400 PT Individual Time Calculation (min): 60 min   Short Term Goals: Week 1:  PT Short Term Goal 1 (Week 1): =LTG due to ELOS  Skilled Therapeutic Interventions/Progress Updates:    Pt received seated in recliner in room eating lunch, agreeable to participate in therapy session and finish lunch after session. No complaints of pain, does report feeling fatigued. Seated BP 117/84 while wearing abdominal binder and THT. Sit to stand with close Supervision to CGA to RW during session. Ambulation x 200, x 100 ft with RW and CGA for balance. Pt exhibits improved tolerance for gait training this date. Pt does continue to exhibit flexed knees with increased flexion with onset of fatigue, uneven step length bilaterally, and steps very close to RW. Pt also exhibits ongoing ataxia in BLE. Attempted standing balance task, pt has onset of increased B knee flexion in stance and unable to fully extend knees in stance due to fatigue. Deferred standing activity for remainder of session due fatigue. Nustep level 6 x 10 min with use of B UE/LE for global endurance training. Pt left seated in recliner in room with needs in reach, chair alarm in place.  Therapy Documentation Precautions:  Precautions Precautions: Fall Precaution Comments: Montior BP /HR Restrictions Weight Bearing Restrictions: No       Therapy/Group: Individual Therapy   Excell Seltzer, PT, DPT, CSRS  10/27/2021, 5:13 PM

## 2021-10-27 NOTE — Progress Notes (Signed)
Physical Therapy Session Note  Patient Details  Name: Jaime [name] MRN: [id number] Date of Birth: [birth year]  Today's Date: 10/27/2021 PT Individual Time: 9379-0240 PT Individual Time Calculation (min): 41 min   Short Term Goals: Week 1:  PT Short Term Goal 1 (Week 1): =LTG due to ELOS  Skilled Therapeutic Interventions/Progress Updates:    Pt received sitting in recliner and agreeable to therapy session. Pt already wearing B LE thigh high TED hose and abdominal binder - vitals assessed throughout and WNL - details below. Sit>stand recliner>RW with CGA for steadying. Gait training ~157ft using RW with CGA/light min assist for steadying/balance - continues to demo ataxic gait with B LE incoordination as well as truncal ataxia with increased postural sway (tends to have L LOB) - maintains wide BOS with B LEs slightly externally rotated. Stand pivot transfers using B UE support on therapist's shoulders to target increased postural control with heavy min assist for balance throughout session - pt requires verbal cuing to ensure he is lined up with seat prior to sitting.  Transported to/from gym in w/c for time management and energy conservation.  Standing with B UE support on RW and min assist for balance due to varying L>R lateral lean with anterior bias while therapist donned maxi-sky harness total assist - pt appears to have poor proprioceptive/spatial awareness to know when he is leaning resulting in lack of initiation of a balance recovery strategy.    Dynamic gait training in maxi-sky harness for safety using L HHA, which pt relies heavily upon, and mod assist for balance, ~22ft x3 of forward ambulation with varying turns towards R/L when turning around - continues to have significant truncal ataxia with moderate postural sway as well as B LE incoordination resulting in variable step width and step length - pt has tendency to have L LOB. Progressed to R/L lateral side stepping ~69ft each  direction with L HHA and mod assist for balance - continues to have L LOB tendency that is most significant when side stepping towards the L - continues to have B LE incoordination though more controlled with side stepping compared to forward gait.   Transported back to room and stand pivot back to recliner as described above. Pt left seated in recliner with needs in reach and chair alarm on.  Vitals:  Sitting in recliner at beginning of session: BP 113/80 (MAP 90), HR 102bpm  After gait training using RW: BP 126/82 (MAP 95), HR 91bpm  After gait training in maxi-sky harness: BP 125/85 (MAP 98), HR 99bpm   Therapy Documentation Precautions:  Precautions Precautions: Fall Precaution Comments: Montior BP /HR Restrictions Weight Bearing Restrictions: No   Pain:  Denies pain during session.   Therapy/Group: Individual Therapy  Ginny Forth , PT, DPT, NCS, CSRS  10/27/2021, 5:57 PM

## 2021-10-27 NOTE — IPOC Note (Signed)
Overall Plan of Care [facility]) Patient Details Name: Jaime [name] MRN: [id number] DOB: [birth year]  Admitting Diagnosis: Multiple sclerosis exacerbation [facility])  Hospital Problems: Principal Problem:   Multiple sclerosis exacerbation (HCC)     Functional Problem List: Nursing Behavior, Bladder, Bowel, Safety, Endurance, Sensory, Medication Management, Motor, Skin Integrity  PT Balance, Endurance, Motor, Safety, Sensory  OT Balance, Endurance, Motor, Safety, Sensory  SLP    TR         Basic ADLs: OT Grooming, Bathing, Dressing, Toileting     Advanced  ADLs: OT Simple Meal Preparation     Transfers: PT Bed Mobility, Bed to Chair, Car, [employer], Civil Service fast streamer, Research scientist (life sciences): PT Ambulation, Psychologist, prison and probation services, Stairs     Additional Impairments: OT Fuctional Use of Upper Extremity (due to ataxia)  SLP        TR      Anticipated Outcomes Item Anticipated Outcome  Self Feeding supervision  Swallowing      Basic self-care  supervision  Toileting  supervision   Bathroom Transfers supervision  Bowel/Bladder  to be continent x 2  Transfers  Supervision  Locomotion  Supervision with LRAD  Communication     Cognition     Pain  less than 3  Safety/Judgment  to remain fall free while in rehab   Therapy Plan: PT Intensity: Minimum of 1-2 x/day ,45 to 90 minutes PT Frequency: 5 out of 7 days PT Duration Estimated Length of Stay: 7-10 days OT Intensity: Minimum of 1-2 x/day, 45 to 90 minutes OT Frequency: 5 out of 7 days OT Duration/Estimated Length of Stay: 7-10 days     Due to the current state of emergency, patients may not be receiving their 3-hours of Medicare-mandated therapy.   Team Interventions: Nursing Interventions Patient/Family Education, Disease Management/Prevention, Skin Care/Wound Management, Discharge Planning, Bladder Management, Cognitive Remediation/Compensation, Psychosocial Support, Bowel Management, Medication  Management  PT interventions Ambulation/gait training, Warden/ranger, Community reintegration, Discharge planning, Disease management/prevention, DME/adaptive equipment instruction, Functional mobility training, Neuromuscular re-education, Patient/family education, Psychosocial support, Stair training, Therapeutic Activities, Therapeutic Exercise, UE/LE Strength taining/ROM, UE/LE Coordination activities  OT Interventions Balance/vestibular training, Disease mangement/prevention, Self Care/advanced ADL retraining, Therapeutic Exercise, DME/adaptive equipment instruction, Cognitive remediation/compensation, Pain management, Skin care/wound managment, UE/LE Strength taining/ROM, Patient/family education, Community reintegration, Discharge planning, Functional mobility training, Psychosocial support, Therapeutic Activities  SLP Interventions    TR Interventions    SW/CM Interventions Discharge Planning, Psychosocial Support, Patient/Family Education   Barriers to Discharge MD  Medical stability, Home enviroment access/loayout, Lack of/limited family support, Weight, Behavior, Nutritional means, and autism and MS  Nursing Decreased caregiver support, Lack of/limited family support    PT Home environment access/layout    OT Home environment access/layout Home is multilevel, can pt reside on main level?  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

## 2021-10-27 NOTE — Progress Notes (Signed)
Occupational Therapy Session Note  Patient Details  Name: Jaime [name] MRN: [id number] Date of Birth: June 21, 1996  Today's Date: 10/27/2021 OT Individual Time: 0900-1000 OT Individual Time Calculation (min): 60 min    Short Term Goals: Week 1:  OT Short Term Goal 1 (Week 1): STGs=LTGs due to ELOS  Skilled Therapeutic Interventions/Progress Updates:    Pt resting in bed upon arrival and eating breakfast upon arrival. Pt agreeable to getting OOB for bathing/dressing with sit<>stand from recliner. BP/HR at beginning of session: 106/80 and HR of 97. Pt with report of "slight" lightheadedness during session but otherwise asymptomatic with abd binder and thigh high Ted hose. Transfer to recliner with min A (ataxic gait.) CGA for bathing/dressing with assistance for brief. Pt requires more then a reasonable amount of time to initiate and transition between tasks. Pt requested to use toilet and amb with RW (min A) to bathroom. Toileting with min A for standing balance. Pt amb with RW to sink in room and stood at sink (heavy reliance on leaning against sink) to wash hands before returning to recliner. Pt remained in recliner with all needs within reach. Seat alarm activated.  Therapy Documentation Precautions:  Precautions Precautions: Fall Precaution Comments: Montior BP /HR Restrictions Weight Bearing Restrictions: No   Pain: Pt denies pain this morning   Therapy/Group: Individual Therapy  Rich Brave 10/27/2021, 11:49 AM

## 2021-10-27 NOTE — Progress Notes (Signed)
PROGRESS NOTE   Subjective/Complaints: Pt reports no issues- hasn't eaten breakfast yet  Ready for therapy in 30 minutes.   ROS:  Pt denies SOB, abd pain, CP, N/V/C/D, and vision changes    Objective:   No results found. Recent Labs    10/25/21 1357  WBC 11.0*  HGB 16.3  HCT 44.5  PLT 419*   Recent Labs    10/25/21 1357  NA 138  K 4.0  CL 108  CO2 27  GLUCOSE 94  BUN 20  CREATININE 0.89  CALCIUM 8.5*    Intake/Output Summary (Last 24 hours) at 10/27/2021 0850 Last data filed at 10/26/2021 1829 Gross per 24 hour  Intake 460 ml  Output --  Net 460 ml        Physical Exam: Vital Signs Blood pressure 111/80, pulse 72, temperature 98.5 F (36.9 C), temperature source Oral, resp. rate 18, height 5\' 9"  (1.753 m), weight 53.9 kg, SpO2 96 %.   General: awake, alert, appropriate, sitting up in bed- breakfast not touched in front of him;  NAD HENT: conjugate gaze; oropharynx moist CV: regular rate; no JVD Pulmonary: CTA B/L; no W/R/R- good air movement GI: soft, NT, ND, (+)BS Psychiatric: appropriate but extremely flat Neurological: alert- is autistic; tremors of Ue's seen- is chronic for pt Skin: intact Neuro/Musculoskeletal: Alert, 5/5 strength throughout - checked DF and PF this AM- up to 5-/5 B/L . Sensation intact.      Assessment/Plan: 1. Functional deficits which require 3+ hours per day of interdisciplinary therapy in a comprehensive inpatient rehab setting. Physiatrist is providing close team supervision and 24 hour management of active medical problems listed below. Physiatrist and rehab team continue to assess barriers to discharge/monitor patient progress toward functional and medical goals  Care Tool:  Bathing  Bathing activity did not occur:  (not assessed due to time constraints) Body parts bathed by patient: Right arm, Left arm, Chest, Abdomen, Front perineal area, Buttocks, Right  upper leg, Left upper leg, Right lower leg, Left lower leg, Face         Bathing assist Assist Level: Contact Guard/Touching assist     Upper Body Dressing/Undressing Upper body dressing   What is the patient wearing?: Pull over shirt    Upper body assist Assist Level: Set up assist    Lower Body Dressing/Undressing Lower body dressing      What is the patient wearing?: Pants     Lower body assist Assist for lower body dressing: Minimal Assistance - Patient > 75%     Toileting Toileting    Toileting assist Assist for toileting: Contact Guard/Touching assist     Transfers Chair/bed transfer  Transfers assist     Chair/bed transfer assist level: Contact Guard/Touching assist     Locomotion Ambulation   Ambulation assist      Assist level: Minimal Assistance - Patient > 75% Assistive device: Walker-rolling Max distance: 135'   Walk 10 feet activity   Assist     Assist level: Minimal Assistance - Patient > 75% Assistive device: Walker-rolling   Walk 50 feet activity   Assist    Assist level: Minimal Assistance - Patient > 75% Assistive  device: Walker-rolling    Walk 150 feet activity   Assist Walk 150 feet activity did not occur: Safety/medical concerns         Walk 10 feet on uneven surface  activity   Assist Walk 10 feet on uneven surfaces activity did not occur: Safety/medical concerns         Wheelchair     Assist Is the patient using a wheelchair?: Yes (per PT documentation) Type of Wheelchair: Manual (per PT documentation)    Wheelchair assist level: Dependent - Patient 0% Max wheelchair distance: 150'    Wheelchair 50 feet with 2 turns activity    Assist        Assist Level: Dependent - Patient 0%   Wheelchair 150 feet activity     Assist      Assist Level: Dependent - Patient 0%   Blood pressure 111/80, pulse 72, temperature 98.5 F (36.9 C), temperature source Oral, resp. rate 18, height  5\' 9"  (1.753 m), weight 53.9 kg, SpO2 96 %.  Medical Problem List and Plan: 1. Functional deficits secondary to multiple sclerosis flare and lower extremity weakness             -patient may shower             -ELOS/Goals: 5-7 days S            Continue CIR- PT, OT and SLP- team conference today to determine length of stay 2.  Antithrombotics: -DVT/anticoagulation:  Pharmaceutical: Lovenox             -antiplatelet therapy: None 3. Pain Management: Tylenol, baclofen as needed 4. Mood: LCSW to evaluate and provide emotional support. Continue Cymbalta             -antipsychotic agents: n/a 5. Neuropsych: This patient is capable of making decisions on his own behalf. 6. Skin/Wound Care: Routine skin care checks 7. Fluids/Electrolytes/Nutrition: Routine Is and Os and follow-up chemistries 8: Autism spectrum disorder 9: Depression: Continue Cymbalta.  10: Underweight: BMI 17.55: encourage adequate nutrition.  1/24- pt needs constant reminders to eat- started peeling egg this AM, but stil hasn't eaten anything when left.  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   12. Severe vitamin D deficiency: start ergocalciferol 50,000U once per week for 7 weeks. 13. Hyperglycemia: HgbA1c is 5.3. Educated regarding avoiding added sugar in diet.  14. 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   LOS: 3 days A FACE TO FACE EVALUATION WAS PERFORMED  Yuvaan Olander 10/27/2021, 8:50 AM

## 2021-10-28 NOTE — Progress Notes (Signed)
Occupational Therapy Session Note  Patient Details  Name: JIGAR ZIELKE MRN: [id number] Date of Birth: 1996-02-14  Today's Date: 10/28/2021 OT Individual Time: 0930-1040 OT Individual Time Calculation (min): 70 min    Short Term Goals: Week 1:  OT Short Term Goal 1 (Week 1): STGs=LTGs due to ELOS  Skilled Therapeutic Interventions/Progress Updates:  OT intervention with focus on funcitonal amb with RW, bathing at shower level, dressing with sit<>stand from recliner, and BLE therex on Sci[employer] to increase independence with BADLs. Pt amb with RW in room with CGA and tranfserred to walk in shower with min verbal cues for sequencing. Pt completed bathing with supervision. Pt returned to room and completed dressing with sit<>stand from recliner. Pt required CGA for standing. Pt amb with RW to [facility] gym. 7 mins level 6 on NuStep. Pt returned to room and remained seated in recliner with seat alarm activated. All needs within reach.     Therapy Documentation Precautions:  Precautions Precautions: Fall Precaution Comments: Montior BP /HR Restrictions Weight Bearing Restrictions: No  Pain:  Pt denies pain this morning   Therapy/Group: Individual Therapy  Rich Brave 10/28/2021, 10:58 AM

## 2021-10-28 NOTE — Progress Notes (Signed)
Physical Therapy Session Note  Patient Details  Name: Jaime [name] MRN: [id number] Date of Birth: [birth year]  Today's Date: 10/28/2021 PT Individual Time: 0830-0900; 1400-1455 PT Individual Time Calculation (min): 30 min and 55 min  Short Term Goals: Week 1:  PT Short Term Goal 1 (Week 1): =LTG due to ELOS  Skilled Therapeutic Interventions/Progress Updates:    Session 1: Pt received seated in bed having not started eating breakfast yet, pt asking for time to eat. This therapist returned after 30 min and pt has only eaten one slice of bacon, reports he can finish breakfast after therapy session. Pt missed 30 min of scheduled therapy session to allow time to eat. No complaints of pain. Pt is setup A to don abodminal binder, THT, and socks. Seated BP EOB 107/86, mild symptoms of dizziness. Sit to stand with CGA to RW. Ambulatory transfer to therapy gym next room over with RW and CGA for balance. Standing alt L/R 4" step-taps with RW and CGA for balance for LE coordination training. Pt reports mild dizziness following standing task, seated BP 125/84. Sit to stand x 5 reps pushing up from chair with focus on decreased LE support against chair during transfer, CGA for balance. Static standing balance with no UE support and min A with focus on use of ankle strategy to prevent posterior LOB. Once balance obtained in standing pt able to perform alt UE reaching for trgets with posterior and L lateral LOB noted when reaching outside BOS. Pt returned to room and left seated in recliner with needs in reach, chair alarm in place.  Session 2: Pt received seated in recliner in room, agreeable to PT session. No complaints of pain. Seated BP with THT and abdominal binder 112/79. Sit to stand with CGA to RW during session. Ambulatory transfer into bathroom with RW and CGA. Toilet transfer with CGA, pt is CGA for standing balance while managing clothing and washing hands at sink. Standing alt L/R 6" step-ups with  2 handrails and min A for balance, increase in B knee flexion noted. Per pt report his stairs at home have 2 handrails but unable to reach both rails simultaneously. Demonstrated how to navigate stairs laterally with use of one handrail. Ascend/descend 8 x 6" stairs with R handrail and CGA, 8 x 6" stairs with L handrail and min A for balance. Pt reports increased cueing when using L handrail for sequencing, body position, and safe LE placement when taking a step. Seated BP following stair navigation 114/76.  Sidesteps L/R with RW and min A for balance with path deviation and posteriolateral LOB during task. Pt reports feeling fatigued following standing activities this PM, agreeable to seated therex. Seated BUE strengthening with use of 5# dowel rod performing bicep curls, chest press, and OH press 2-3 x 13 reps each. Pt returned to recliner at end of session, needs in reach, chair alarm in place.  Therapy Documentation Precautions:  Precautions Precautions: Fall Precaution Comments: Montior BP /HR Restrictions Weight Bearing Restrictions: No General: PT Amount of Missed Time (min): 30 Minutes PT Missed Treatment Reason: Unavailable (Comment) (eating breakfast)      Therapy/Group: Individual Therapy   Peter Congo, PT, DPT, CSRS  10/28/2021, 12:13 PM

## 2021-10-28 NOTE — Progress Notes (Signed)
°   10/28/21 1503  Assess: MEWS Score  Temp 98 F (36.7 C)  BP 111/73  Pulse Rate (!) 111  Resp 16  Assess: MEWS Score  MEWS Temp 0  MEWS Systolic 0  MEWS Pulse 2  MEWS RR 0  MEWS LOC 0  MEWS Score 2  MEWS Score Color Yellow  Assess: if the MEWS score is Yellow or Red  Were vital signs taken at a resting state? Yes  Focused Assessment No change from prior assessment  Early Detection of Sepsis Score *See Row Information* Medium  MEWS guidelines implemented *See Row Information* Yes  Treat  MEWS Interventions Other (Comment) (Obtain Vital Signs per protocol)  Pain Scale 0-10  Pain Score 0  Take Vital Signs  Increase Vital Sign Frequency  Yellow: Q 2hr X 2 then Q 4hr X 2, if remains yellow, continue Q 4hrs  Escalate  MEWS: Escalate Yellow: discuss with charge nurse/RN and consider discussing with provider and RRT  Notify: Charge Nurse/RN  Name of Charge Nurse/RN Notified Angelina  Date Charge Nurse/RN Notified 10/28/21  Time Charge Nurse/RN Notified 1503  Notify: Provider  Provider Name/Title Risa Grill, PA  Date Provider Notified 10/28/21  Time Provider Notified 1505  Notification Type Call  Notification Reason Change in status  Provider response In department  Date of Provider Response 10/28/21  Time of Provider Response 1507  Document  Patient Outcome Stabilized after interventions;Other (Comment) (Patient monitored and stayed on unit; followed protocol)  Progress note created (see row info) Yes

## 2021-10-29 IMAGING — MR MR HEAD WO/W CM
8 of 16 series · 29 of 48 positions shown · IV contrast (gadavist)
Comparison: Brain MRI 05/19/2020.

CLINICAL DATA: Multiple sclerosis, new event; new left leg
weakness.

EXAM:
MRI HEAD WITHOUT AND WITH CONTRAST
TECHNIQUE: Multiplanar, multiecho pulse sequences of the brain and surrounding
structures were obtained without and with intravenous contrast.
CONTRAST:  5.5mL GADAVIST GADOBUTROL 1 MMOL/ML IV SOLN

[Series 3: DWI · axial · 3.0mm · 1.09mm/px · z∈[-72,+80]mm · 6 of 104 slices shown (1 of 4)]
[im 1/104]
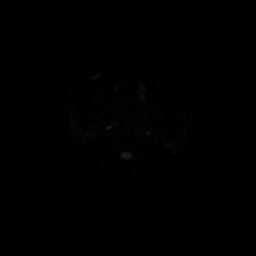
[im 21/104]
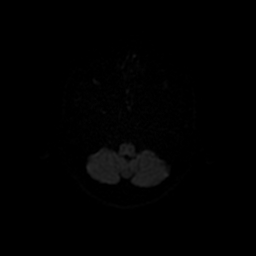
[im 42/104]
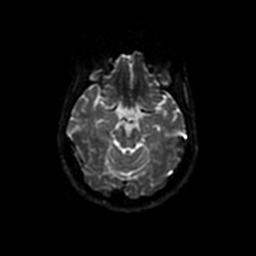
[im 62/104]
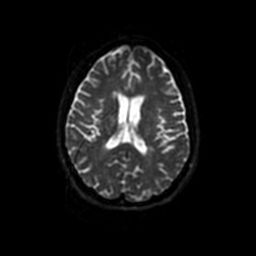
[im 83/104]
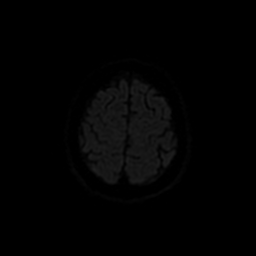
[im 104/104]
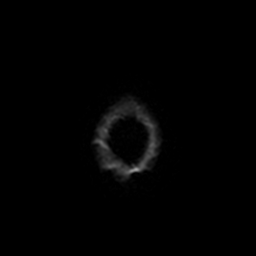

[Series 4: DWI · coronal · 5.0mm · 1.09mm/px · 4 of 80 slices shown (2 of 4)]
[im 1/80]
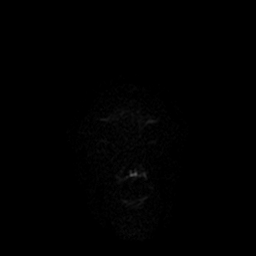
[im 27/80]
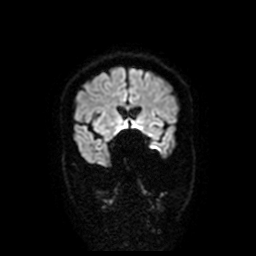
[im 53/80]
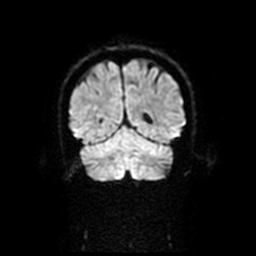
[im 80/80]
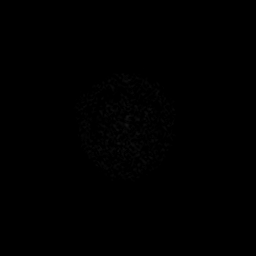

[Series 7: FLAIR · axial · 3.0mm · 0.47mm/px · 1 of 26 slices shown (1 of 2)]
[im 1/26]
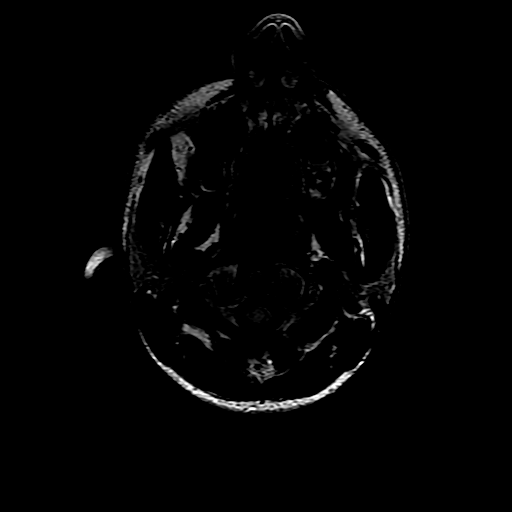

[Series 32: T1 post-contrast · axial · 3.0mm · 0.47mm/px · z∈[-63,+89]mm · 3 of 52 slices shown (1 of 2)]
[im 1/52]
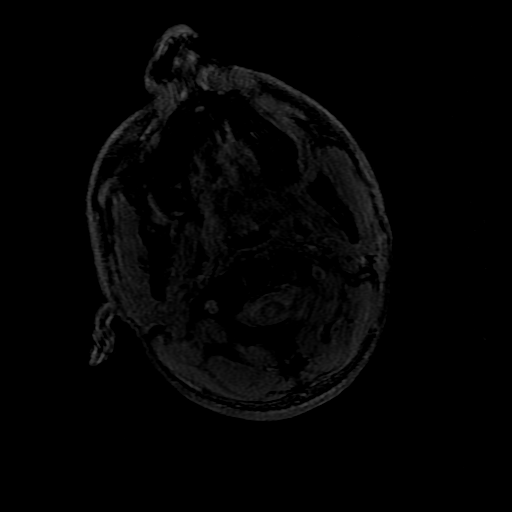
[im 26/52]
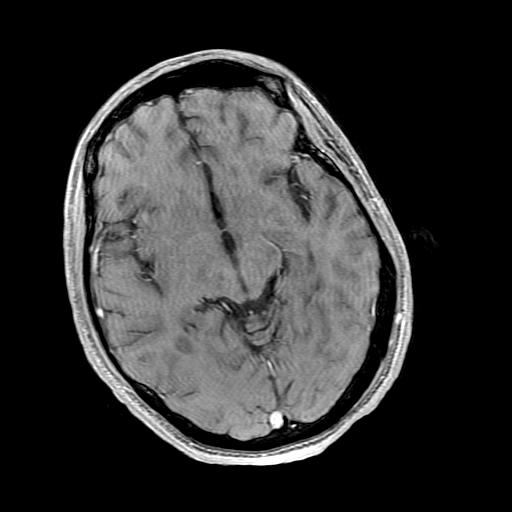
[im 52/52]
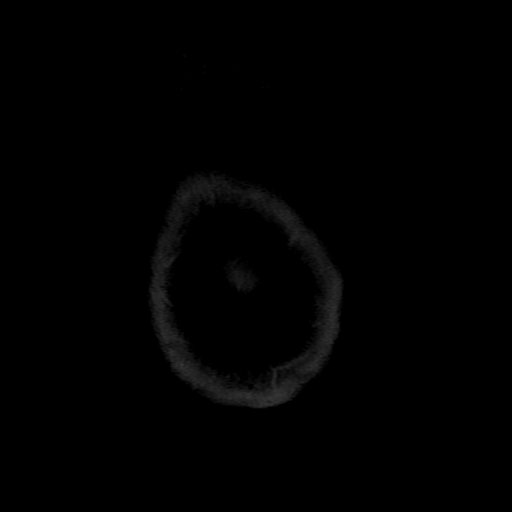

[Series 34: T1 post-contrast · coronal · 5.0mm · 0.78mm/px · 1 of 26 slices shown (2 of 2)]
[im 1/26]
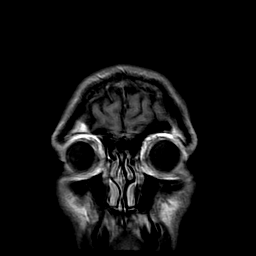

[Series 35: FLAIR · sagittal · 1.2mm · 0.49mm/px · 9 of 272 slices shown (2 of 2)]
[im 1/272]
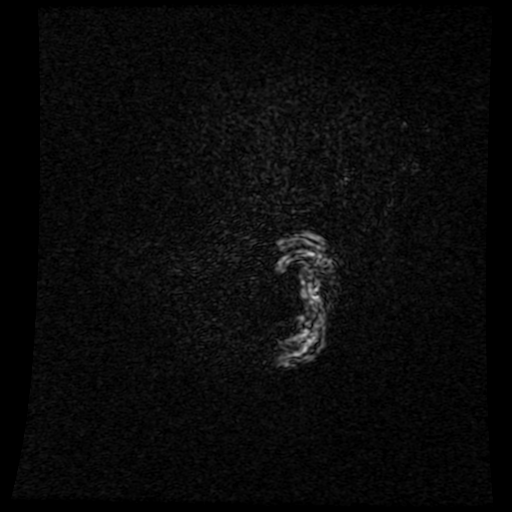
[im 46/272]
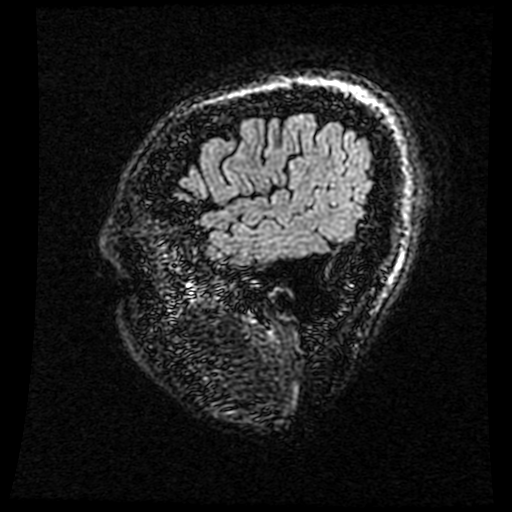
[im 91/272]
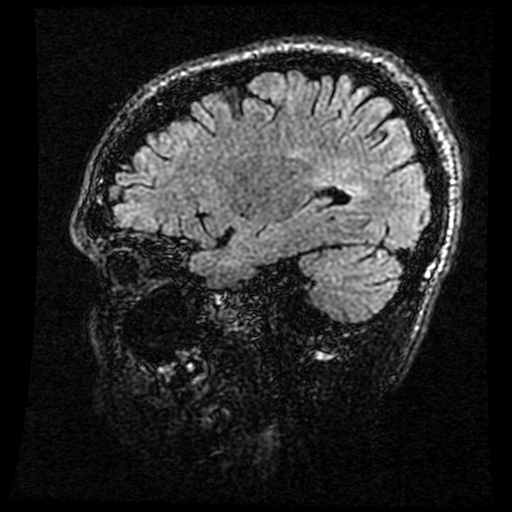
[im 113/272]
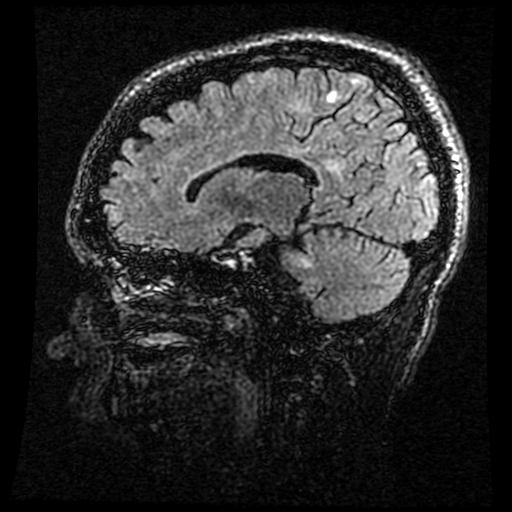
[im 136/272]
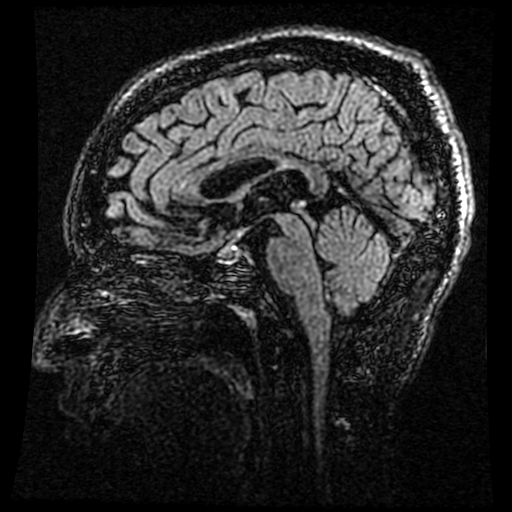
[im 159/272]
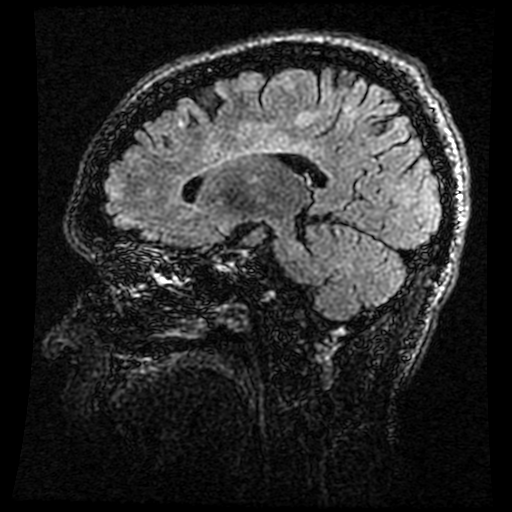
[im 181/272]
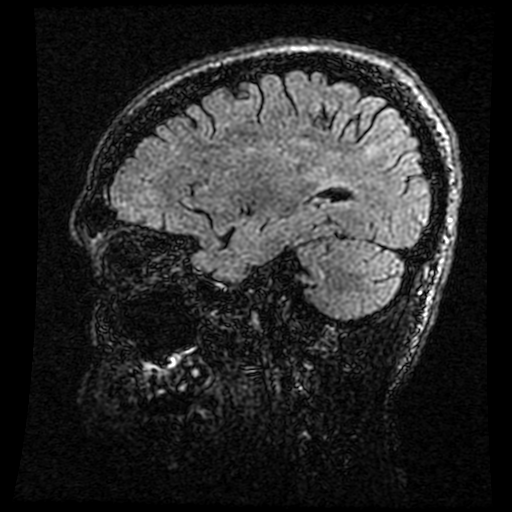
[im 226/272]
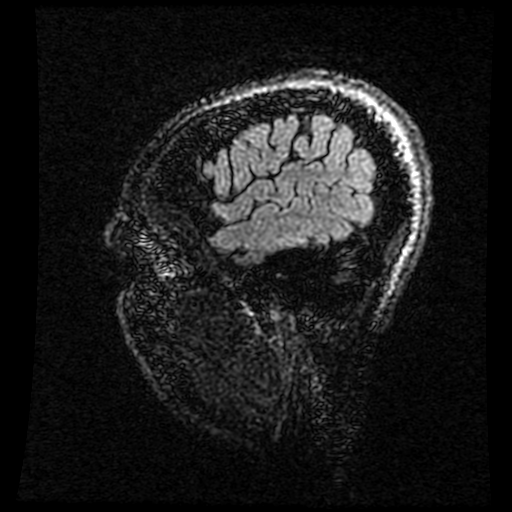
[im 272/272]
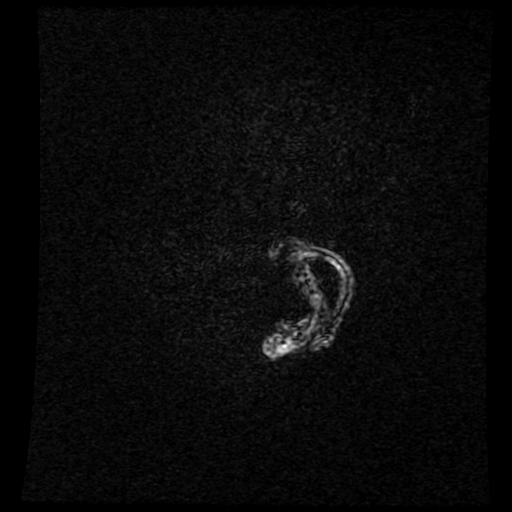

[Series 300: DWI · axial · 3.0mm · 1.09mm/px · z∈[-72,+80]mm · 3 of 52 slices shown (3 of 4)]
[im 1/52]
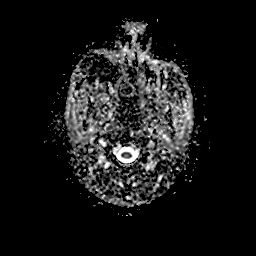
[im 26/52]
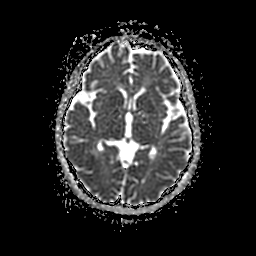
[im 52/52]
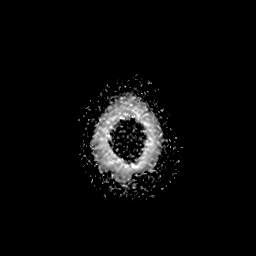

[Series 400: DWI · coronal · 5.0mm · 1.09mm/px · 2 of 40 slices shown (4 of 4)]
[im 1/40]
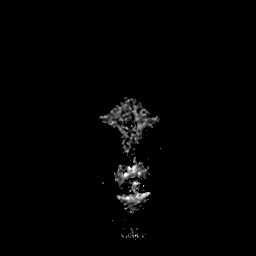
[im 40/40  full-range]
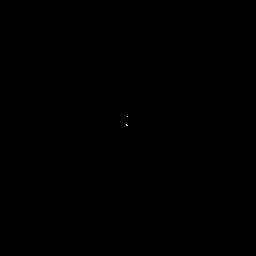

[29 of 48 positions shown; findings below may reference images not displayed]

FINDINGS: Brain:

Mild intermittent motion degradation. This includes mild motion
degradation of the axial T1 weighted postcontrast sequence.

There are numerous subcentimeter enhancing lesions within the
juxtacortical and subcortical white matter of the bilateral cerebral
hemispheres (at least 15-20 in number) (see annotations on series
32, image 34). The majority of, if not all of, these enhancing
lesions appear new as compared to the prior examination of
05/19/2020.

As before, there is background severe multifocal T2/FLAIR
hyperintense signal abnormality within the subcortical/juxtacortical
and deep cerebral white matter, corpus callosum, thalami, brainstem,
cerebellum and middle cerebellar peduncles.

No evidence of intracranial mass.

No chronic intracranial blood products.

No extra-axial fluid collection.

No midline shift.

Vascular: Expected proximal arterial flow voids.

Skull and upper cervical spine: No focal marrow lesion.

Sinuses/Orbits: Visualized orbits show no acute finding. Trace
scattered paranasal sinus mucosal thickening. No significant mastoid
effusion.
IMPRESSION: Mildly motion degraded examination.

Numerous (numbering at least 15-20) subcentimeter enhancing lesions
within the juxtacortical and subcortical white matter of the
bilateral cerebral hemispheres. The majority of, if not all of,
these enhancing lesions appear new as compared to the MRI of
05/19/2020 and are consistent with sites of active demyelination.

As before, there is background severe multifocal T2 hyperintense
signal abnormality within the cerebral white matter, corpus
callosum, thalami, brainstem, cerebellum and middle cerebellar
peduncles. These findings are consistent with the provided history
of multiple sclerosis.

## 2021-10-29 IMAGING — MR MR THORACIC SPINE WO/W CM
4 of 9 series · 18 of 48 positions shown · IV contrast (gadavist)
Comparison: Thoracic spine MRI 12/16/2019.

CLINICAL DATA: Demyelinating disease; MS flare suspected, new left
leg weakness.

EXAM:
MRI THORACIC WITHOUT AND WITH CONTRAST
TECHNIQUE: Multiplanar and multiecho pulse sequences of the thoracic spine were
obtained without and with intravenous contrast.
CONTRAST:  5.5mL GADAVIST GADOBUTROL 1 MMOL/ML IV SOLN

[Series 20: T2 · sagittal · 3.0mm · 0.66mm/px · 3 of 18 slices shown (1 of 2)]
[im 1/18]
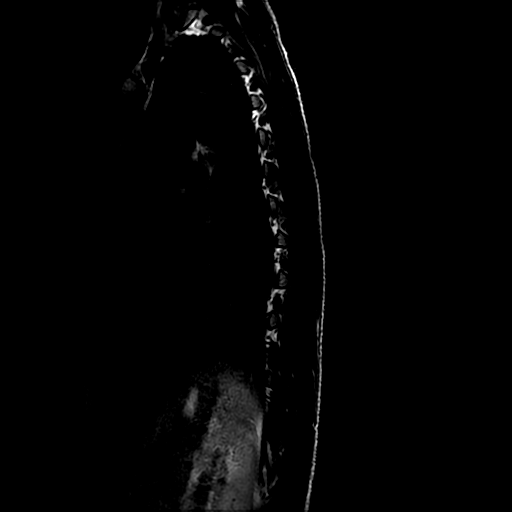
[im 9/18]
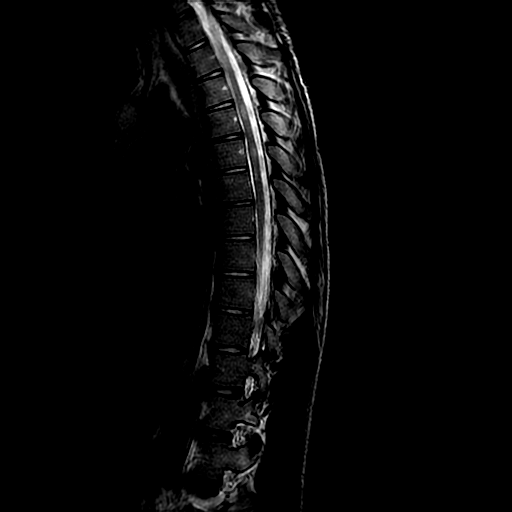
[im 18/18]
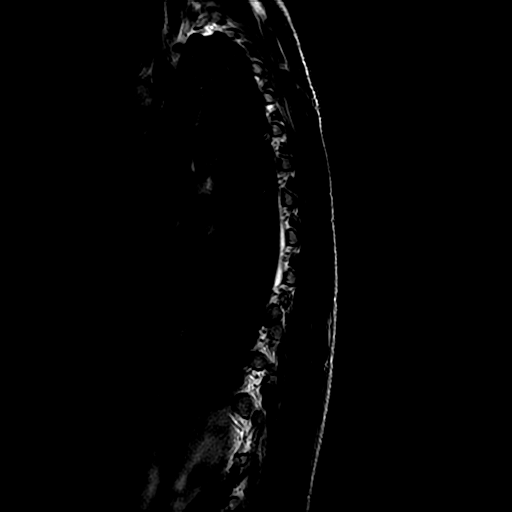

[Series 22: T1 · sagittal · 3.0mm · 0.66mm/px · 4 of 18 slices shown (1 of 2)]
[im 1/18]
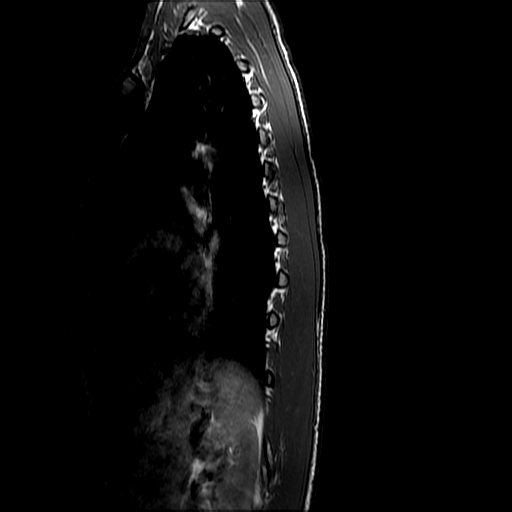
[im 6/18]
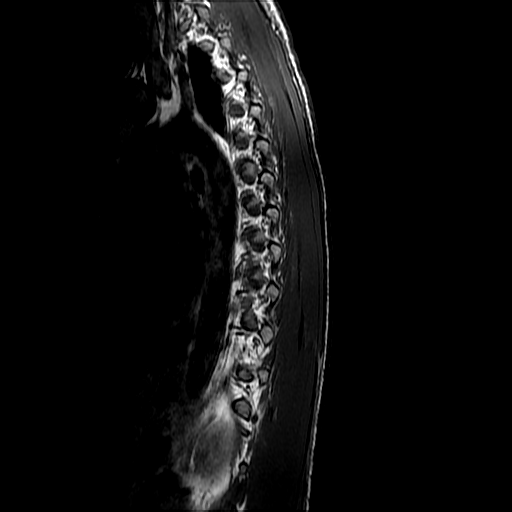
[im 12/18]
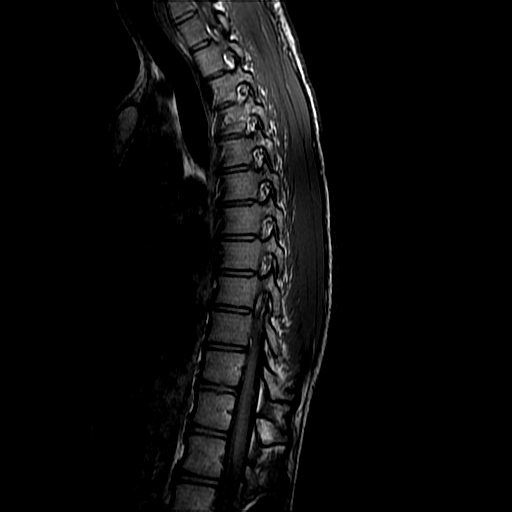
[im 18/18]
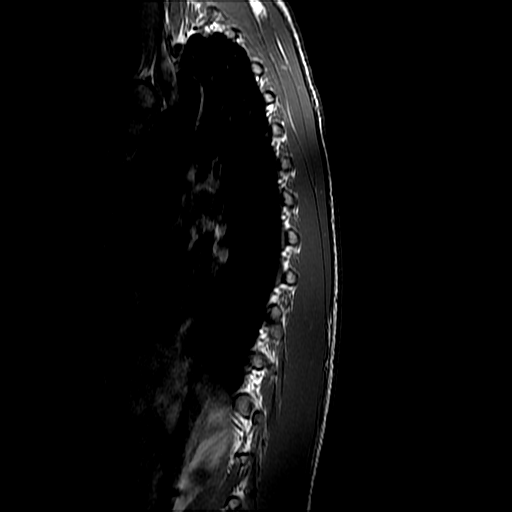

[Series 23: T2 · axial · 4.0mm · 0.43mm/px · z∈[-433,-219]mm · 8 of 36 slices shown (2 of 2)]
[im 1/36]
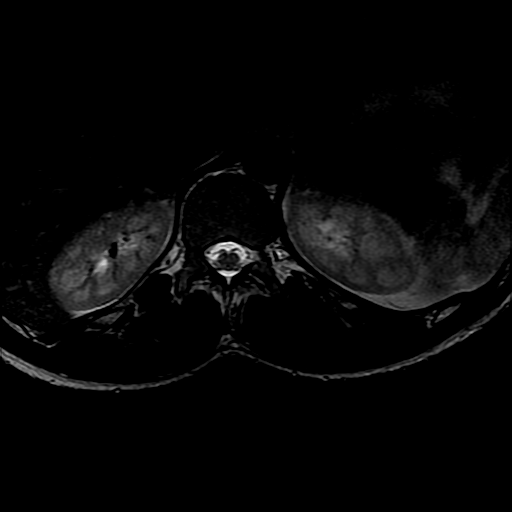
[im 6/36]
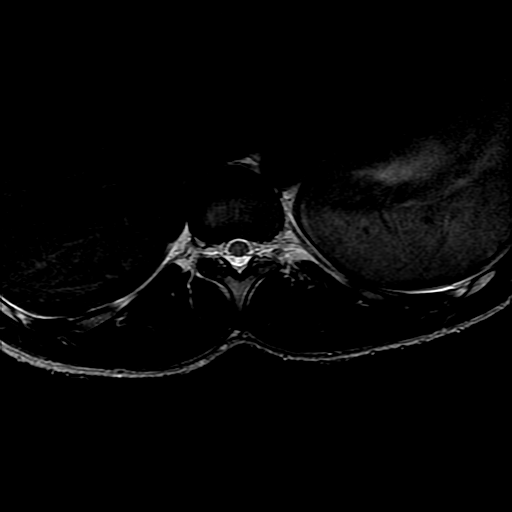
[im 11/36]
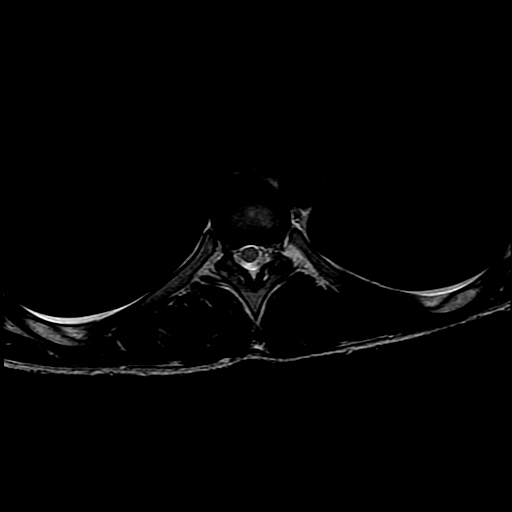
[im 16/36]
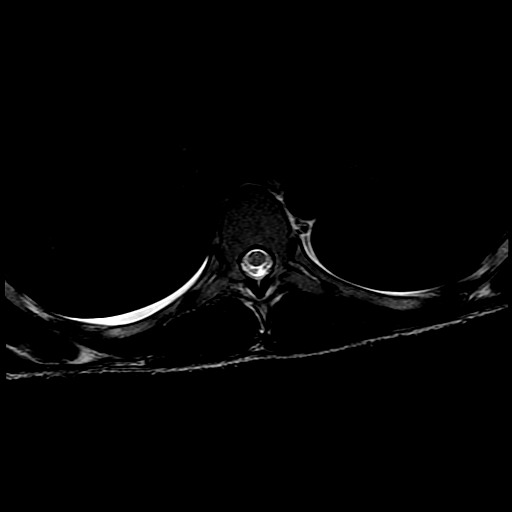
[im 21/36]
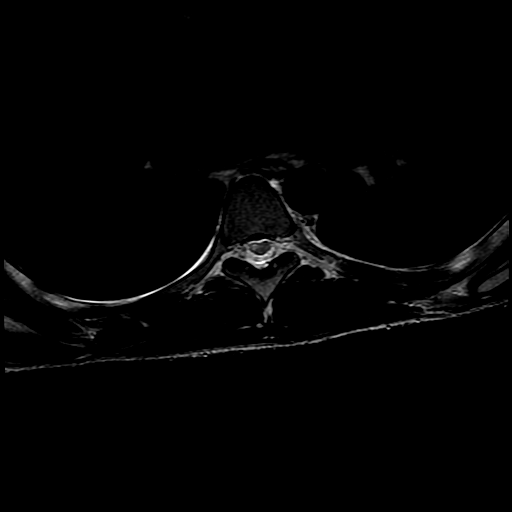
[im 26/36]
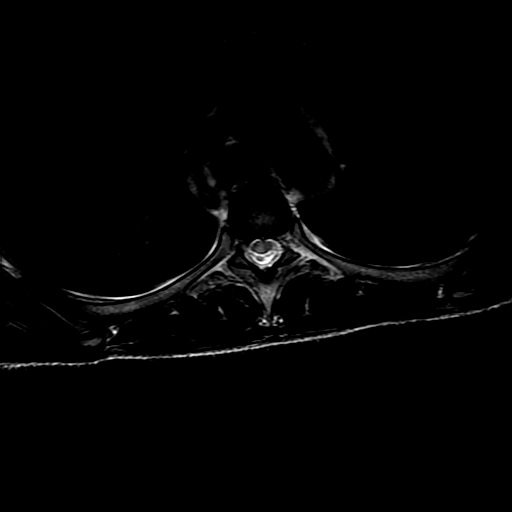
[im 31/36]
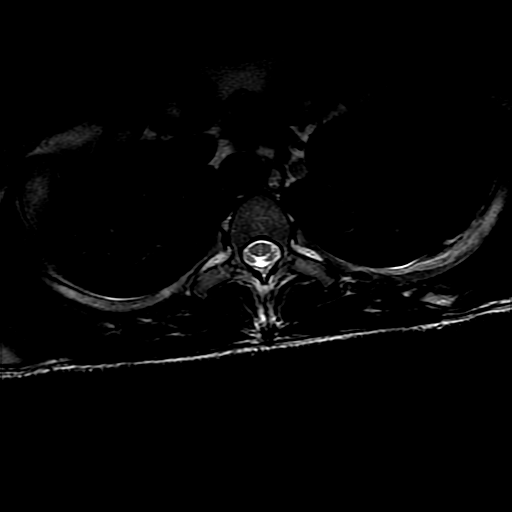
[im 36/36]
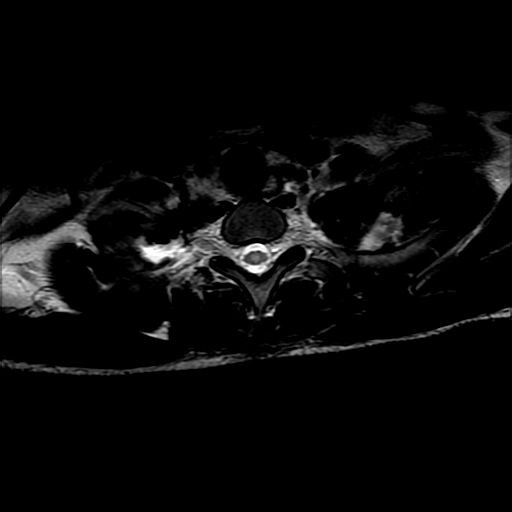

[Series 25: T1 · axial · non-contrast · 4.0mm · 0.43mm/px · z∈[-399,-244]mm · 3 of 36 slices shown (2 of 2)]
[im 6/36]
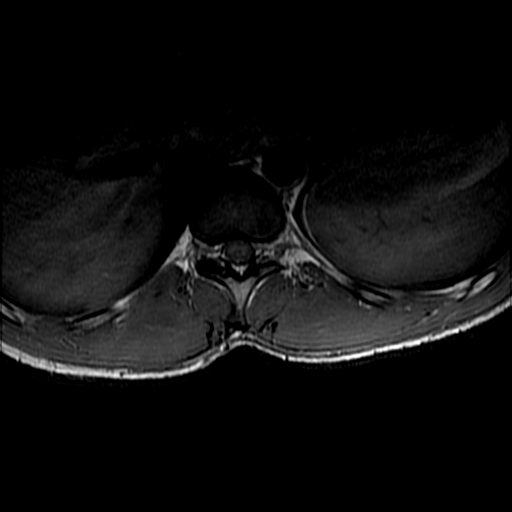
[im 21/36]
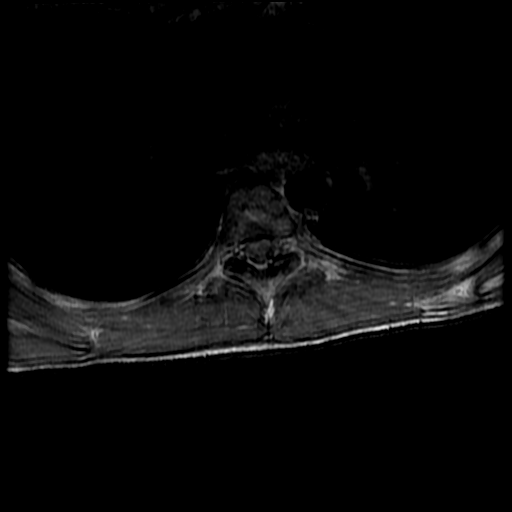
[im 31/36]
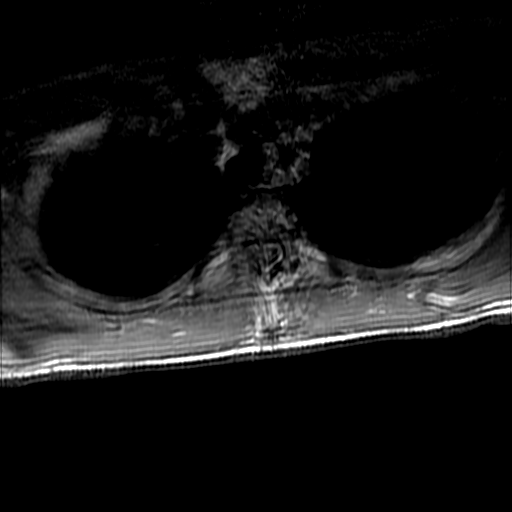

[18 of 48 positions shown; findings below may reference images not displayed]

FINDINGS: MRI THORACIC SPINE FINDINGS

Alignment:  No significant spondylolisthesis.

Vertebrae: Vertebral body height is maintained. No suspicious
osseous lesion or significant marrow edema.

Cord: Again demonstrated is extensive multifocal T2/STIR
hyperintense signal abnormality throughout the majority of the
thoracic spinal cord. There are several lesions which are new or
significantly progressed as compared to the prior examination of
12/16/2019 as follows. A lesion within the central cord at the T4
level (series 23, image 10). A lesion within the right dorsal
lateral aspect of the cord at the T5 level (series 23, image 13). A
lesion within the right aspect of the spinal cord at the T6 level
(series 23, image 16). A lesion within the central cord at the T7-T8
level (series 23, image 19). A lesion within the central cord at the
T11 level (series 23, image 30). Faint contrast enhancement is
questioned at the sites of the T4 and T5 level lesions and this
reflect active demyelination (series 26, image 11). No definite
abnormal enhancement is identified elsewhere within the thoracic
cord.

Paraspinal and other soft tissues: No abnormality identified within
included portions of the thorax or upper abdomen.

Disc levels:

At T4-T5, there is unchanged tiny central disc protrusion which
partially effaces the ventral thecal sac and contacts the ventral
spinal cord. No significant central canal stenosis.

At T6-T7, there is an unchanged small central disc protrusion which
partially effaces the ventral thecal sac and contacts the ventral
spinal cord. No significant spinal canal stenosis. No significant
foraminal narrowing at any level.
IMPRESSION: Extensive multifocal T2 hyperintense signal abnormality affecting
the majority of the thoracic spinal cord. There are T2 hyperintense
lesions at the T4, T5, T6, T7-T8 and T11 levels which are new or
significantly progressed as compared to the MRI of 12/16/2019. These
findings are consistent with the provided history of demyelinating
disease/multiple sclerosis. There is questionable enhancement
corresponding with the T4 and T5 level lesions and this may reflect
active demyelination.

## 2021-10-29 NOTE — Progress Notes (Signed)
Occupational Therapy Session Note  Patient Details  Name: MCCARTNEY CHUBA MRN: [id number] Date of Birth: [birth year]  Today's Date: 10/29/2021 OT Group Time: 1433-1530 OT Group Time Calculation (min): 57 min   Short Term Goals: Week 1:  OT Short Term Goal 1 (Week 1): STGs=LTGs due to ELOS  Skilled Therapeutic Interventions/Progress Updates:  Pt participated in group session with a focus on stress mgmt, education on healthy coping strategies, and social interaction. Focus of session on providing coping strategies to manage new current level of function as a result of new diagnosis.  Session focus on breaking down stressors into daily hassles, major life stressors and life circumstances in an effort to allow pts to chunk their stressors into groups. Pt mostly quiet during session but did participate when asked questions.Provided active listening, emotional support and therapeutic use of self. Offered education on factors that protect [place] against stress such as daily uplifts, healthy coping strategies and protective factors. Encouraged all group members to make an effort to actively recall one event from their day that was a daily uplift in an effort to protect their mindset from stressors as well as sharing this information with their caregivers to facilitate improved caregiver communication and decrease overall burden of care.  Issued pt handouts on healthy coping strategies to implement into routine. Pt transported back to room by this OTA where pt completed stand pivot transfer from w/c>recliner with MIN HHA. Pt left seated in recliner with chair alarm activated and all needs within reach.   Therapy Documentation Precautions:  Precautions Precautions: Fall Precaution Comments: Montior BP /HR Restrictions Weight Bearing Restrictions: No  Pain: no pain reported during session     Therapy/Group: Group Therapy  Barron Schmid 10/29/2021, 4:20 PM

## 2021-10-29 NOTE — Progress Notes (Signed)
Physical Therapy Session Note  Patient Details  Name: Jaime [name] MRN: [id number] Date of Birth: [birth year]  Today's Date: 10/29/2021 PT Individual Time: 1130-1205 PT Individual Time Calculation (min): 35 min   Short Term Goals: Week 1:  PT Short Term Goal 1 (Week 1): =LTG due to ELOS  Skilled Therapeutic Interventions/Progress Updates:  Tx1: Pt presented in bed agreeable to therapy. Pt noted that has syrup on arm and self. Provided wet washcloth and pt was able to clean appropriately. Performed supine to sit with supervision and use of bed features. BP checked upon sitting 107/79 (87) HR 110. Performed ambulatory transfer to w/c with CGA. Noted pt maintains B knee flexion in standing. Pt transported to therapy room and participated in dynamic standing activities including hand taps to target and horseshoe toss. Pt demonstrates some mild instability as well as B knee flexion which pt was able to correct with verbal cues however was unable to maintain for more than 5-10 seconds. When asked pt would indicate some increased dizziness in standing which lessened with seated rest. Pt then ambulated from [facility] therapy room back to room ~45f with CGA and w/c follow. Pt left in recliner at end of session with seat alarm on, call bell within reach and needs met.   Tx2: Pt presented in recliner on phone. Pt stating still eating lunch (burger approx 1/3 eaten) and requesting to continue eating. Pt left sitting more erect and engaging on eating meal. Pt missed 20 min skilled therapy.    Therapy Documentation Precautions:  Precautions Precautions: Fall Precaution Comments: Montior BP /HR Restrictions Weight Bearing Restrictions: No General: PT Amount of Missed Time (min): 40 Minutes Vital Signs: Therapy Vitals Temp: 98 F (36.7 C) Temp Source: Oral Pulse Rate: 99 Resp: 16 BP: 115/81 Patient Position (if appropriate): Sitting Oxygen Therapy SpO2: 98 % O2 Device: Room Air Pain:    Mobility:   Locomotion :    Trunk/Postural Assessment :    Balance:   Exercises:   Other Treatments:      Therapy/Group: Individual Therapy  Doctor Sheahan 10/29/2021, 2:20 PM

## 2021-10-29 NOTE — Progress Notes (Signed)
PROGRESS NOTE   Subjective/Complaints:  Pt reports he's OK- is sleeping- per staff, he eats, but VERY slow to finish a meal- like >1 hr.  Pt said pooping "OK" Having more tremors/shaking yesterday but a little better today.   ROS:   Pt denies SOB, abd pain, CP, N/V/C/D, and vision changes   Objective:   No results found. No results for input(s): WBC, HGB, HCT, PLT in the last 72 hours.  No results for input(s): NA, K, CL, CO2, GLUCOSE, BUN, CREATININE, CALCIUM in the last 72 hours.   Intake/Output Summary (Last 24 hours) at 10/29/2021 1055 Last data filed at 10/29/2021 0854 Gross per 24 hour  Intake 240 ml  Output 700 ml  Net -460 ml        Physical Exam: Vital Signs Blood pressure (!) 117/94, pulse 88, temperature 97.9 F (36.6 C), temperature source Oral, resp. rate 16, height 5\' 9"  (1.753 m), weight 53.9 kg, SpO2 97 %.    General: awake, alert, appropriate, sleeping initially- woke tom verbal stimuli;  NAD HENT: conjugate gaze; oropharynx moist CV: regular rate; no JVD Pulmonary: CTA B/L; no W/R/R- good air movement GI: soft, NT, ND, (+)BS Psychiatric: appropriate; very flat; sleepy;  Neurological: alert Skin: intact Neuro/Musculoskeletal: Alert, 5/5 strength throughout - checked DF and PF this AM- up to 5-/5 B/L . Sensation intact.      Assessment/Plan: 1. Functional deficits which require 3+ hours per day of interdisciplinary therapy in a comprehensive inpatient rehab setting. Physiatrist is providing close team supervision and 24 hour management of active medical problems listed below. Physiatrist and rehab team continue to assess barriers to discharge/monitor patient progress toward functional and medical goals  Care Tool:  Bathing  Bathing activity did not occur:  (not assessed due to time constraints) Body parts bathed by patient: Right arm, Left arm, Chest, Abdomen, Front perineal area,  Buttocks, Right upper leg, Left upper leg, Right lower leg, Left lower leg, Face         Bathing assist Assist Level: Contact Guard/Touching assist     Upper Body Dressing/Undressing Upper body dressing   What is the patient wearing?: Pull over shirt    Upper body assist Assist Level: Set up assist    Lower Body Dressing/Undressing Lower body dressing      What is the patient wearing?: Incontinence brief, Pants     Lower body assist Assist for lower body dressing: Minimal Assistance - Patient > 75%     Toileting Toileting    Toileting assist Assist for toileting: Minimal Assistance - Patient > 75%     Transfers Chair/bed transfer  Transfers assist     Chair/bed transfer assist level: Contact Guard/Touching assist Chair/bed transfer assistive device: Programmer, multimedia   Ambulation assist      Assist level: Minimal Assistance - Patient > 75% Assistive device: Walker-rolling Max distance: 189ft   Walk 10 feet activity   Assist     Assist level: Contact Guard/Touching assist Assistive device: Walker-rolling   Walk 50 feet activity   Assist    Assist level: Contact Guard/Touching assist Assistive device: Walker-rolling    Walk 150 feet activity  Assist Walk 150 feet activity did not occur: Safety/medical concerns  Assist level: Contact Guard/Touching assist Assistive device: Walker-rolling    Walk 10 feet on uneven surface  activity   Assist Walk 10 feet on uneven surfaces activity did not occur: Safety/medical concerns         Wheelchair     Assist Is the patient using a wheelchair?: Yes (per PT documentation) Type of Wheelchair: Manual (per PT documentation)    Wheelchair assist level: Dependent - Patient 0% Max wheelchair distance: 150'    Wheelchair 50 feet with 2 turns activity    Assist        Assist Level: Dependent - Patient 0%   Wheelchair 150 feet activity     Assist      Assist  Level: Dependent - Patient 0%   Blood pressure (!) 117/94, pulse 88, temperature 97.9 F (36.6 C), temperature source Oral, resp. rate 16, height 5\' 9"  (1.753 m), weight 53.9 kg, SpO2 97 %.  Medical Problem List and Plan: 1. Functional deficits secondary to multiple sclerosis flare and lower extremity weakness             -patient may shower             -ELOS/Goals: 5-7 days S            d/c date 1/31- con't CIR- PT and OT- tremors are a little better- will need f/u with Neuro outpt to address them chronically.  2.  Antithrombotics: -DVT/anticoagulation:  Pharmaceutical: Lovenox             -antiplatelet therapy: None 3. Pain Management: Tylenol, baclofen as needed 4. Mood: LCSW to evaluate and provide emotional support. Continue Cymbalta             -antipsychotic agents: n/a 5. Neuropsych: This patient is capable of making decisions on his own behalf. 6. Skin/Wound Care: Routine skin care checks 7. Fluids/Electrolytes/Nutrition: Routine Is and Os and follow-up chemistries 8: Autism spectrum disorder 9: Depression: Continue Cymbalta.  10: Underweight: BMI 17.55: encourage adequate nutrition.  1/24- pt needs constant reminders to eat- started peeling egg this AM, but stil hasn't eaten anything when left.  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   12. Severe vitamin D deficiency: start ergocalciferol 50,000U once per week for 7 weeks. 13. Hyperglycemia: HgbA1c is 5.3. Educated regarding avoiding added sugar in diet.  14. 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     LOS: 5 days A FACE TO FACE EVALUATION WAS PERFORMED  Jaime [name] 10/29/2021, 10:55 AM

## 2021-10-29 NOTE — Progress Notes (Signed)
Occupational Therapy Session Note  Patient Details  Name: Jaime [name] MRN: [id number] Date of Birth: 1996/01/29  Today's Date: 10/29/2021 OT Individual Time: 7867-5449 OT Individual Time Calculation (min): 58 min    Short Term Goals: Week 1:  OT Short Term Goal 1 (Week 1): STGs=LTGs due to ELOS  Skilled Therapeutic Interventions/Progress Updates:    Pt resting in bed upon arrival. Pt looking at cell phone and tv also on. Pt declined changing clothing or washing up this morning. Pt agreed to donning abd binder and thigh Ted hose for OOB activities. Pt performed tasks without assistance. BP at rest in bed: 111/93 HR 85. Pt amb with RW to to [facility] gym for standing activities and NuStep exercise. BP after walk: 114/79 HR 95. Pt noted with significant BLE and BUE ataxia. Amb with min A/CGA. 7 mins level 6 on NuStep. BP: 117/94 HR 60. Pt returned to bed in room. Bed alarm activated. All needs within reach.   Therapy Documentation Precautions:  Precautions Precautions: Fall Precaution Comments: Montior BP /HR Restrictions Weight Bearing Restrictions: No Pain:  Pt denies pain this morning   Therapy/Group: Individual Therapy  Rich Brave 10/29/2021, 9:18 AM

## 2021-10-30 MED ORDER — MIDODRINE HCL 5 MG PO TABS
10.0000 mg | ORAL_TABLET | Freq: Three times a day (TID) | ORAL | Status: DC
  Administered 2021-10-30 – 2021-11-03 (×14): 10 mg via ORAL
  Filled 2021-10-30 (×15): qty 2

## 2021-10-30 NOTE — Progress Notes (Signed)
Physical Therapy Session Note  Patient Details  Name: Jaime [name] MRN: [id number] Date of Birth: 1995-10-10  Today's Date: 10/30/2021 PT Individual Time: 1002-1100 PT Individual Time Calculation (min): 58 min   Short Term Goals: Week 1:  PT Short Term Goal 1 (Week 1): =LTG due to ELOS  Skilled Therapeutic Interventions/Progress Updates:   Pt presents supine in bed and agreeable to therapy.  Pt transfers sup to sit w/ supervision  Pt sat EOB for monitor of BP.  BP of 112/74 w/ HR of 96.  Pt states some dizziness, pt already wearing abd binder and THT.  Pt transfers sit to stand w/ CGA, but using back of legs on bed.  Pt amb x 10' w/ RW and min A including turn to w/c.  Pt requires verbal cues for step length, stepping under RW front.  Pt amb x 20' to [facility] gym.  Pt performed Nu-step at Level 3 x 5' x 2 w/ seated rest break between trials.  BP after amb to gym at 111/84, HR 102.  Pt performed standing reaching outside of BOS crossing midline for placement of horseshoes.  Pt w/ flexed knees and able to correct w/ verbal cues but not self-correcting.  Pt amb x 50' x 2 w/ min A.  Pt fatigues and begins to ER and drag feet forward through adduction.  Pt requires verbal direction to sit or would continue to drag feet.  Pt also requires verbal cues for safe approach to seat w/ decreased backing for safety.  Pt remained sitting up in recliner w/ seat alarm on and all needs in reach.     Therapy Documentation Precautions:  Precautions Precautions: Fall Precaution Comments: Montior BP /HR Restrictions Weight Bearing Restrictions: No General:   Vital Signs:   Pain:0/10       Therapy/Group: Individual Therapy  [name] 10/30/2021, 11:01 AM

## 2021-10-30 NOTE — Progress Notes (Signed)
Occupational Therapy Session Note  Patient Details  Name: RAYNARD MAPPS MRN: [id number] Date of Birth: [birth year]  Today's Date: 10/30/2021 OT Individual Time: 1135-1200 OT Individual Time Calculation (min): 25 min    Short Term Goals: Week 1:  OT Short Term Goal 1 (Week 1): STGs=LTGs due to ELOS  Skilled Therapeutic Interventions/Progress Updates:    Pt received in recliner with no c/o pain, agreeable to OT session. Session focused on functional coordination in the trunk, BUE, & BLE, as well as dynamic standing balance. Min A for functional mobility, 50 ft, using the RW from his room to the gym. He then completed dynamic stepping activity with no UE support, reciprocal foot tapping onto a 8 in step, requiring mod A to maintain balance with highly ataxic trunk and BLE. He then transitioned to quadruped with min A on the mat. He completed alternating UE reaching to challenge core stability with CGA. Lastly he completed prone pushups with 50-75% of the range. Pt returned to his room and was left supine with all needs met, chair alarm set.    Therapy Documentation Precautions:  Precautions Precautions: Fall Precaution Comments: Montior BP /HR Restrictions Weight Bearing Restrictions: No   Therapy/Group: Individual Therapy  Curtis Sites 10/30/2021, 6:24 AM

## 2021-10-30 NOTE — Progress Notes (Signed)
Patient ID: Jaime [name], male   DOB: [birth year], 26 y.o.   MRN: [id number]   SW faxed incontinence supplies order to [facility] (p:[phone]; [phone]/f:[phone]   Cecile Sheerer, MSW, LCSWA Office: [phone] Cell: [phone] Fax: [phone]

## 2021-10-30 NOTE — Progress Notes (Signed)
Occupational Therapy Session Note  Patient Details  Name: Jaime [name] MRN: [id number] Date of Birth: 08-25-1996  Today's Date: 10/30/2021 OT Individual Time: 0900-0930 OT Individual Time Calculation (min): 30 min  and Today's Date: 10/30/2021 OT Missed Time: 45 Minutes Missed Time Reason: Patient unwilling/refused to participate without medical reason (eating breakfast)   Short Term Goals: Week 1:  OT Short Term Goal 1 (Week 1): STGs=LTGs due to ELOS  Skilled Therapeutic Interventions/Progress Updates:    Pt sleeping upon arrival but easily aroused. Pt had not eaten breakfast yet. Missed 45 mins OT services while pt eating breakfast. After eating breakfast, OT intervention focused on donning abd binder and Ted hose before donning paper scrubs. Pt completed all tasks with supervision sit<>stand from EOB. Pt declined brushing teeth this morning. Pt requires more then a reasonable amount of time to complete tasks. Min verbal cues to attend to task. Pt remained in bed with all needs within reach. Bed alarm activated.   Therapy Documentation Precautions:  Precautions Precautions: Fall Precaution Comments: Montior BP /HR Restrictions Weight Bearing Restrictions: No General: General OT Amount of Missed Time: 45 Minutes   Pain:  Pt denies pain this morning    Therapy/Group: Individual Therapy  Rich Brave 10/30/2021, 9:35 AM

## 2021-10-30 NOTE — Progress Notes (Signed)
Occupational Therapy Session Note  Patient Details  Name: Jaime [name] MRN: [id number] Date of Birth: 08/09/1996  Today's Date: 10/30/2021 OT Individual Time: 1300-1340 OT Individual Time Calculation (min): 40 min    Short Term Goals: Week 1:  OT Short Term Goal 1 (Week 1): STGs=LTGs due to ELOS  Skilled Therapeutic Interventions/Progress Updates:    Pt resting in recliner upon arrival. Lunch tray on table in front of pt. Pt agreeable to therapy. OT intervention with focus on functional coordination in trunk, BUE, and BLE, in additon to dynamic standing balance. Pt amb approx 40' with RW in hallway with Min A/CGA. Pt with significant ataxic gait. Dynamic standing tasks at table using BUE to complete simple functional tasks. Pt relies on leaning against table to stabilize when standing. Pt returned to room. Pt remained in relciner with seate alarm activated. All needs within reach.   Therapy Documentation Precautions:  Precautions Precautions: Fall Precaution Comments: Montior BP /HR Restrictions Weight Bearing Restrictions: No   Pain:  Pt denies pain this afternoon   Therapy/Group: Individual Therapy  Rich Brave 10/30/2021, 2:45 PM

## 2021-10-30 NOTE — Progress Notes (Addendum)
PROGRESS NOTE   Subjective/Complaints:  Pt reports doing OK- just woke up.  A little tired more than anything, but also c/o dizziness and lightheadedness- doesn't like abd binder.   Red MEWS resolved yesterday- due to tachycardia/low BP.    ROS:   Pt denies SOB, abd pain, CP, N/V/C/D, and vision changes   Objective:   No results found. No results for input(s): WBC, HGB, HCT, PLT in the last 72 hours.  No results for input(s): NA, K, CL, CO2, GLUCOSE, BUN, CREATININE, CALCIUM in the last 72 hours.   Intake/Output Summary (Last 24 hours) at 10/30/2021 0752 Last data filed at 10/30/2021 0718 Gross per 24 hour  Intake 237 ml  Output 900 ml  Net -663 ml        Physical Exam: Vital Signs Blood pressure 111/73, pulse 87, temperature 98 F (36.7 C), temperature source Oral, resp. rate 18, height 5\' 9"  (1.753 m), weight 53.9 kg, SpO2 93 %.     General: awake, alert, appropriate, just woke up; laying on R side facing away from door; woke easily; NAD HENT: conjugate gaze; oropharynx moist CV: regular rate and rhythm- not tachycardic this AM; no JVD Pulmonary: CTA B/L; no W/R/R- good air movement GI: soft, NT, ND, (+)BS Psychiatric: appropriate- flat affect- chronic Neurological: Ox3; more tremors seen this AM Skin: intact Neuro/Musculoskeletal: Alert, 5/5 strength throughout - checked DF and PF this AM- up to 5-/5 B/L . Sensation intact.      Assessment/Plan: 1. Functional deficits which require 3+ hours per day of interdisciplinary therapy in a comprehensive inpatient rehab setting. Physiatrist is providing close team supervision and 24 hour management of active medical problems listed below. Physiatrist and rehab team continue to assess barriers to discharge/monitor patient progress toward functional and medical goals  Care Tool:  Bathing  Bathing activity did not occur:  (not assessed due to time  constraints) Body parts bathed by patient: Right arm, Left arm, Chest, Abdomen, Front perineal area, Buttocks, Right upper leg, Left upper leg, Right lower leg, Left lower leg, Face         Bathing assist Assist Level: Contact Guard/Touching assist     Upper Body Dressing/Undressing Upper body dressing   What is the patient wearing?: Pull over shirt    Upper body assist Assist Level: Set up assist    Lower Body Dressing/Undressing Lower body dressing      What is the patient wearing?: Incontinence brief, Pants     Lower body assist Assist for lower body dressing: Minimal Assistance - Patient > 75%     Toileting Toileting    Toileting assist Assist for toileting: Minimal Assistance - Patient > 75%     Transfers Chair/bed transfer  Transfers assist     Chair/bed transfer assist level: Contact Guard/Touching assist Chair/bed transfer assistive device:   Ambulation assist      Assist level: Minimal Assistance - Patient > 75% Assistive device: Walker-rolling Max distance: 147ft   Walk 10 feet activity   Assist     Assist level: Contact Guard/Touching assist Assistive device: Walker-rolling   Walk 50 feet activity   Assist  Assist level: Contact Guard/Touching assist Assistive device: Walker-rolling    Walk 150 feet activity   Assist Walk 150 feet activity did not occur: Safety/medical concerns  Assist level: Contact Guard/Touching assist Assistive device: Walker-rolling    Walk 10 feet on uneven surface  activity   Assist Walk 10 feet on uneven surfaces activity did not occur: Safety/medical concerns         Wheelchair     Assist Is the patient using a wheelchair?: Yes (per PT documentation) Type of Wheelchair: Manual (per PT documentation)    Wheelchair assist level: Dependent - Patient 0% Max wheelchair distance: 150'    Wheelchair 50 feet with 2 turns activity    Assist         Assist Level: Dependent - Patient 0%   Wheelchair 150 feet activity     Assist      Assist Level: Dependent - Patient 0%   Blood pressure 111/73, pulse 87, temperature 98 F (36.7 C), temperature source Oral, resp. rate 18, height 5\' 9"  (1.753 m), weight 53.9 kg, SpO2 93 %.  Medical Problem List and Plan: 1. Functional deficits secondary to multiple sclerosis flare and lower extremity weakness             -patient may shower             -ELOS/Goals: 5-7 days S            d/c date 1/31- con't CIR- PT and OT- tremors are a little better- will need f/u with Neuro outpt to address them chronically.   -con't CIR- PT and OT- pt hates abd binder- will see if need to increase Midodrine to home dose? 2.  Antithrombotics: -DVT/anticoagulation:  Pharmaceutical: Lovenox             -antiplatelet therapy: None 3. Pain Management: Tylenol, baclofen as needed 4. Mood: LCSW to evaluate and provide emotional support. Continue Cymbalta             -antipsychotic agents: n/a 5. Neuropsych: This patient is capable of making decisions on his own behalf. 6. Skin/Wound Care: Routine skin care checks 7. Fluids/Electrolytes/Nutrition: Routine Is and Os and follow-up chemistries 8: Autism spectrum disorder 9: Depression: Continue Cymbalta. 1/27- controlled overall- con't regimen  10: Underweight: BMI 17.55: encourage adequate nutrition.  1/24- pt needs constant reminders to eat- started peeling egg this AM, but stil hasn't eaten anything when left.  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   12. Severe vitamin D deficiency: start ergocalciferol 50,000U once per week for 7 weeks. 13. Hyperglycemia: HgbA1c is 5.3. Educated regarding avoiding added sugar in diet.  14. 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  1/27- had red MEWS yesterday due to tachycardia/hypotension- will increase midodrine to home dose since won't wear abd binder regularly. If tachycardia continues, might need a low dose Beta blocker? 15. Urinary incontinence  1/27- due to MS has urinary urgency and intermittent incontinence- will need incontinence supplies at home- will write for these.    I spent 35    minutes on appointment today; more than 50% of that time was spent on educating patient as per details/plan above. Discussing urinary issues and BP and tachycardia with nursing as well including abd binder issues.      LOS: 6 days A FACE TO  FACE EVALUATION WAS PERFORMED  Gregor Dershem 10/30/2021, 7:52 AM

## 2021-10-31 DIAGNOSIS — I951 Orthostatic hypotension: Secondary | ICD-10-CM

## 2021-10-31 DIAGNOSIS — F32A Depression, unspecified: Secondary | ICD-10-CM

## 2021-10-31 MED ORDER — SORBITOL 70 % SOLN
60.0000 mL | Freq: Once | Status: AC
  Administered 2021-10-31: 60 mL via ORAL

## 2021-10-31 NOTE — Progress Notes (Signed)
Occupational Therapy Session Note  Patient Details  Name: AMITAI DELAUGHTER MRN: [id number] Date of Birth: 07/10/1996  Today's Date: 11/01/2021 OT Individual Time: 1035-1101 OT Individual Time Calculation (min): 26 min   Short Term Goals: Week 1:  OT Short Term Goal 1 (Week 1): STGs=LTGs due to ELOS  Skilled Therapeutic Interventions/Progress Updates:    Pt greeted in the recliner with no c/o pain. He preferred to participate in UB exercises vs ambulate today during short session. Guided pt through B UE exercises using 5# bar x20 reps 2 sets with instruction to work on strength and coordination for improving functional independence during self care tasks and transfers. Min hand over hand cuing for technique. Pt required several rest breaks as session progressed but motivated to participate throughout. He remained sitting up at close of session, all needs within reach and chair alarm set.   Therapy Documentation Precautions:  Precautions Precautions: Fall Precaution Comments: Montior BP /HR Restrictions Weight Bearing Restrictions: No Pain: Pain Assessment Pain Scale: 0-10 Pain Score: 0-No pain ADL: ADL Eating: Minimal assistance Where Assessed-Eating: Edge of bed Grooming: Minimal assistance Where Assessed-Grooming: Edge of bed Upper Body Bathing: Not assessed Lower Body Bathing: Not assessed Upper Body Dressing: Supervision/safety Where Assessed-Upper Body Dressing: Edge of bed Lower Body Dressing: Minimal assistance Where Assessed-Lower Body Dressing: Edge of bed Toileting: Not assessed Tub/Shower Transfer: Not assessed   Therapy/Group: Individual Therapy  Anaston Koehn A Adriannah Steinkamp 11/01/2021, 12:51 PM

## 2021-10-31 NOTE — Progress Notes (Deleted)
Inpatient Rehabilitation Discharge Medication Review by a Pharmacist  A complete drug regimen review was completed for this patient to identify any potential clinically significant medication issues.  High Risk Drug Classes Is patient taking? Indication by Medication  Antipsychotic No   Anticoagulant No    Antibiotic No   Opioid No   Antiplatelet No   Hypoglycemics/insulin No   Vasoactive Medication Yes Midodrine- blood pressure  Chemotherapy No   Other Yes Baclofen - Muscle spasms Duloxetine - Depression  Vitamin D- Vit D deficiency     Type of Medication Issue Identified Description of Issue Recommendation(s)  Drug Interaction(s) (clinically significant)     Duplicate Therapy     Allergy     No Medication Administration End Date     Incorrect Dose     Additional Drug Therapy Needed     Significant med changes from prior encounter (inform family/care partners about these prior to discharge).    Other       Clinically significant medication issues were identified that warrant physician communication and completion of prescribed/recommended actions by midnight of the next day:  No  Time spent performing this drug regimen review (minutes):  20   Liese Dizdarevic BS, PharmD, BCPS Clinical Pharmacist 11/03/2021 9:14 AM  Please check AMION for all [place] phone numbers After 10:00 PM, call [facility] [phone]

## 2021-10-31 NOTE — Progress Notes (Signed)
PROGRESS NOTE   Subjective/Complaints:  Pt slept well. Slow to arouse this morning. Denies any problems.   ROS: Limited due to cognitive/behavioral    Objective:   No results found. No results for input(s): WBC, HGB, HCT, PLT in the last 72 hours.  No results for input(s): NA, K, CL, CO2, GLUCOSE, BUN, CREATININE, CALCIUM in the last 72 hours.   Intake/Output Summary (Last 24 hours) at 10/31/2021 1427 Last data filed at 10/31/2021 1005 Gross per 24 hour  Intake 358 ml  Output 675 ml  Net -317 ml        Physical Exam: Vital Signs Blood pressure 117/78, pulse 100, temperature 98.5 F (36.9 C), temperature source Oral, resp. rate 16, height 5\' 9"  (1.753 m), weight 53.9 kg, SpO2 97 %.     Constitutional: No distress . Vital signs reviewed. HEENT: NCAT, EOMI, oral membranes moist Neck: supple Cardiovascular: RRR without murmur. No JVD    Respiratory/Chest: CTA Bilaterally without wheezes or rales. Normal effort    GI/Abdomen: BS +, non-tender, non-distended Ext: no clubbing, cyanosis, or edema Psych: very flat, difficult to engage Neurological: Ox3; more tremors seen this AM Skin: intact Neuro/Musculoskeletal: Alert, 5/5 strength throughout - checked DF and PF this AM- up to 5-/5 B/L . Sensation intact.      Assessment/Plan: 1. Functional deficits which require 3+ hours per day of interdisciplinary therapy in a comprehensive inpatient rehab setting. Physiatrist is providing close team supervision and 24 hour management of active medical problems listed below. Physiatrist and rehab team continue to assess barriers to discharge/monitor patient progress toward functional and medical goals  Care Tool:  Bathing  Bathing activity did not occur:  (not assessed due to time constraints) Body parts bathed by patient: Right arm, Left arm, Chest, Abdomen, Front perineal area, Buttocks, Right upper leg, Left upper leg,  Right lower leg, Left lower leg, Face         Bathing assist Assist Level: Contact Guard/Touching assist     Upper Body Dressing/Undressing Upper body dressing   What is the patient wearing?: Pull over shirt    Upper body assist Assist Level: Set up assist    Lower Body Dressing/Undressing Lower body dressing      What is the patient wearing?: Incontinence brief, Pants     Lower body assist Assist for lower body dressing: Minimal Assistance - Patient > 75%     Toileting Toileting    Toileting assist Assist for toileting: Minimal Assistance - Patient > 75%     Transfers Chair/bed transfer  Transfers assist     Chair/bed transfer assist level: Contact Guard/Touching assist Chair/bed transfer assistive device: Programmer, multimedia   Ambulation assist      Assist level: Contact Guard/Touching assist Assistive device: Walker-rolling Max distance: 50   Walk 10 feet activity   Assist     Assist level: Contact Guard/Touching assist Assistive device: Walker-rolling   Walk 50 feet activity   Assist    Assist level: Contact Guard/Touching assist Assistive device: Walker-rolling    Walk 150 feet activity   Assist Walk 150 feet activity did not occur: Safety/medical concerns  Assist level: Contact Guard/Touching assist  Assistive device: Walker-rolling    Walk 10 feet on uneven surface  activity   Assist Walk 10 feet on uneven surfaces activity did not occur: Safety/medical concerns         Wheelchair     Assist Is the patient using a wheelchair?: Yes (per PT documentation) Type of Wheelchair: Manual (per PT documentation)    Wheelchair assist level: Dependent - Patient 0% Max wheelchair distance: 150'    Wheelchair 50 feet with 2 turns activity    Assist        Assist Level: Dependent - Patient 0%   Wheelchair 150 feet activity     Assist      Assist Level: Dependent - Patient 0%   Blood pressure  117/78, pulse 100, temperature 98.5 F (36.9 C), temperature source Oral, resp. rate 16, height 5\' 9"  (1.753 m), weight 53.9 kg, SpO2 97 %.  Medical Problem List and Plan: 1. Functional deficits secondary to multiple sclerosis flare and lower extremity weakness             -patient may shower             -ELOS/Goals: 5-7 days S            d/c date 1/31-  -Continue CIR therapies including PT, OT  2.  Antithrombotics: -DVT/anticoagulation:  Pharmaceutical: Lovenox             -antiplatelet therapy: None 3. Pain Management: Tylenol, baclofen as needed 4. Mood: LCSW to evaluate and provide emotional support. Continue Cymbalta             -antipsychotic agents: n/a 5. Neuropsych: This patient is capable of making decisions on his own behalf. 6. Skin/Wound Care: Routine skin care checks 7. Fluids/Electrolytes/Nutrition: Routine Is and Os and follow-up chemistries 8: Autism spectrum disorder 9: Depression: Continue Cymbalta. 1/28- controlled overall- con't regimen, mood baseline?  10: Underweight: BMI 17.55: encourage adequate nutrition.  1/24- pt needs constant reminders to eat- started peeling egg this AM, but stil hasn't eaten anything when left.  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   12. Severe vitamin D deficiency: start ergocalciferol 50,000U once per week for 7 weeks. 13. Hyperglycemia: HgbA1c is 5.3. Educated regarding avoiding added sugar in diet.  14. 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  1/27- had red MEWS yesterday due to tachycardia/hypotension- will increase midodrine to home dose since won't wear abd binder regularly. If tachycardia continues, might need a low dose Beta blocker?  1/28 bp's look a little more robust today and HR around 90   -continue with current  midodrine 15. Urinary incontinence  1/27- due to MS has urinary urgency and intermittent incontinence- will need incontinence supplies at home- will write for these.        LOS: 7 days A FACE TO FACE EVALUATION WAS PERFORMED  Jaime [name] 10/31/2021, 2:27 PM

## 2021-11-01 NOTE — Progress Notes (Signed)
Physical Therapy Session Note  Patient Details  Name: Jaime [name] MRN: [id number] Date of Birth: August 03, 1996  Today's Date: 11/01/2021 PT Individual Time: 1000-1030; 1500-1600 PT Individual Time Calculation (min): 30 min and 60 min  Short Term Goals: Week 1:  PT Short Term Goal 1 (Week 1): =LTG due to ELOS  Skilled Therapeutic Interventions/Progress Updates:    Session 1: Pt received seated in bed, agreeable to PT session. No complaints of pain. Pt is setup A to don THT, abdominal binder, and shirt at bed level. Bed mobility Supervision with increased time. Seated BP 112/82. Sit to stand with Supervision throughout session with use of RW. Ambulatory transfer to toilet with RW and close Supervision. Pt is Supervision for clothing management following toileting. Ambulatory transfer to sink to wash hands with RW and close Supervision. Standing balance with Supervision while washing hands, pt braces pelvis against sink for stability. Ambulatory transfer back to recliner with RW and close Supervision. Pt reports he had some dizziness when standing up from toilet that has resolved. Seated BP 134/85. Pt left seated in recliner in room with needs in reach, chair alarm in place at end of session.  Session 2: Pt received seated in recliner in room, agreeable to PT session. No complaints of pain. Pt wearing THT and abdominal binder, no complaints of dizziness during session so BP not assessed. Sit to stand and transfers with Supervision and RW throughout session. Ambulation x 260 ft with RW and CGA for balance, ongoing increased hip/knee flexion and keeping RW too close during gait as well as ataxia. Ascend/descend 4 x 6" stairs with L handrail and CGA, with R handrail and CGA to simulate home environment. Pt exhibits improved safety and sequencing with stairs this date. Sidesteps L/R with RW and CGA overall for balance, 4 x 10 ft each direction with visual cue to keep pt in straight lateral line. Pt has  one anterior LOB requiring min A to recover to prevent fall. Pt returned to recliner and left seated in chair with needs in reach, chair alarm in place.  Therapy Documentation Precautions:  Precautions Precautions: Fall Precaution Comments: Montior BP /HR Restrictions Weight Bearing Restrictions: No     Therapy/Group: Individual Therapy   Peter Congo, PT, DPT, CSRS  11/01/2021, 12:27 PM

## 2021-11-02 NOTE — Progress Notes (Signed)
PROGRESS NOTE   Subjective/Complaints:  Pt reports slept well- slept OK- tremors "come and go". Mother upset about pt's BP- which according to therapy has been well controlled and tremors are intermittent, due to MS Dx.    [id number] by cognition/sedation   Objective:   No results found. No results for input(s): WBC, HGB, HCT, PLT in the last 72 hours.  No results for input(s): NA, K, CL, CO2, GLUCOSE, BUN, CREATININE, CALCIUM in the last 72 hours.   Intake/Output Summary (Last 24 hours) at 11/02/2021 1847 Last data filed at 11/02/2021 1341 Gross per 24 hour  Intake 355 ml  Output --  Net 355 ml        Physical Exam: Vital Signs Blood pressure 115/82, pulse 100, temperature 97.9 F (36.6 C), resp. rate 16, height 5\' 9"  (1.753 m), weight 53.9 kg, SpO2 97 %.      General: awake, alert, appropriate, sleepy; supine in bed; just woke up; NAD HENT: conjugate gaze; oropharynx moist CV: regular rate; no JVD Pulmonary: CTA B/L; no W/R/R- good air movement GI: soft, NT, ND, (+)BS Psychiatric: appropriate but flat- autistic Neurological: alert, but  very sleepy- just woke up  Ext: no clubbing, cyanosis, or edema Psych: very flat, difficult to engage Neurological: Ox3; more tremors seen this AM Skin: intact Neuro/Musculoskeletal: Alert, 5/5 strength throughout - checked DF and PF this AM- up to 5-/5 B/L . Sensation intact.      Assessment/Plan: 1. Functional deficits which require 3+ hours per day of interdisciplinary therapy in a comprehensive inpatient rehab setting. Physiatrist is providing close team supervision and 24 hour management of active medical problems listed below. Physiatrist and rehab team continue to assess barriers to discharge/monitor patient progress toward functional and medical goals  Care Tool:  Bathing  Bathing activity did not occur:  (not assessed due to time constraints) Body parts  bathed by patient: Right arm, Left arm, Chest, Abdomen, Front perineal area, Buttocks, Right upper leg, Left upper leg, Right lower leg, Left lower leg, Face         Bathing assist Assist Level: Supervision/Verbal cueing     Upper Body Dressing/Undressing Upper body dressing   What is the patient wearing?: Pull over shirt    Upper body assist Assist Level: Independent    Lower Body Dressing/Undressing Lower body dressing      What is the patient wearing?: Pants     Lower body assist Assist for lower body dressing: Supervision/Verbal cueing     Toileting Toileting    Toileting assist Assist for toileting: Supervision/Verbal cueing     Transfers Chair/bed transfer  Transfers assist     Chair/bed transfer assist level: Supervision/Verbal cueing Chair/bed transfer assistive device: Programmer, multimedia   Ambulation assist      Assist level: Contact Guard/Touching assist Assistive device: Walker-rolling Max distance: 260'   Walk 10 feet activity   Assist     Assist level: Contact Guard/Touching assist Assistive device: Walker-rolling   Walk 50 feet activity   Assist    Assist level: Contact Guard/Touching assist Assistive device: Walker-rolling    Walk 150 feet activity   Assist Walk 150 feet activity did  not occur: Safety/medical concerns  Assist level: Contact Guard/Touching assist Assistive device: Walker-rolling    Walk 10 feet on uneven surface  activity   Assist Walk 10 feet on uneven surfaces activity did not occur: Safety/medical concerns   Assist level: Contact Guard/Touching assist Assistive device: Walker-rolling   Wheelchair     Assist Is the patient using a wheelchair?: No Type of Wheelchair: Manual (per PT documentation)    Wheelchair assist level: Dependent - Patient 0% Max wheelchair distance: 150'    Wheelchair 50 feet with 2 turns activity    Assist        Assist Level: Dependent -  Patient 0%   Wheelchair 150 feet activity     Assist      Assist Level: Dependent - Patient 0%   Blood pressure 115/82, pulse 100, temperature 97.9 F (36.6 C), resp. rate 16, height 5\' 9"  (1.753 m), weight 53.9 kg, SpO2 97 %.  Medical Problem List and Plan: 1. Functional deficits secondary to multiple sclerosis flare and lower extremity weakness             -patient may shower             -ELOS/Goals: 5-7 days S            d/c date 1/31-  -Continue CIR therapies including PT, OT  D/c tomorrow- con't family training today and therapies.  2.  Antithrombotics: -DVT/anticoagulation:  Pharmaceutical: Lovenox             -antiplatelet therapy: None 3. Pain Management: Tylenol, baclofen as needed 4. Mood: LCSW to evaluate and provide emotional support. Continue Cymbalta             -antipsychotic agents: n/a 5. Neuropsych: This patient is capable of making decisions on his own behalf. 6. Skin/Wound Care: Routine skin care checks 7. Fluids/Electrolytes/Nutrition: Routine Is and Os and follow-up chemistries 8: Autism spectrum disorder 9: Depression: Continue Cymbalta. 1/28- controlled overall- con't regimen, mood baseline?  10: Underweight: BMI 17.55: encourage adequate nutrition.  1/24- pt needs constant reminders to eat- started peeling egg this AM, but stil hasn't eaten anything when left.  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 1/30- also has tremors; from [place]- con't meds   12. Severe vitamin D deficiency: start ergocalciferol 50,000U once per week for 7 weeks. 13. Hyperglycemia: HgbA1c is 5.3. Educated regarding avoiding added sugar in diet.  14. 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  1/27- had red MEWS yesterday due to tachycardia/hypotension- will increase  midodrine to home dose since won't wear abd binder regularly. If tachycardia continues, might need a low dose Beta blocker?  1/28 bp's look a little more robust today and HR around 90   -continue with current midodrine  1/30- BP's look good and HR 90s- low 100s- therapy tolerated by pt-  15. Urinary incontinence  1/27- due to MS has urinary urgency and intermittent incontinence- will need incontinence supplies at home- will write for these.   I spent a total of  39  minutes on total care today- >50% coordination of care- due to d/w entire team about pt's BP and tremors and HR- still sending home tomorrow.      LOS: 9 days A FACE TO FACE EVALUATION WAS PERFORMED  Torie Towle 11/02/2021, 6:47 PM

## 2021-11-02 NOTE — Progress Notes (Addendum)
Patient ID: Jaime [name], male   DOB: 12/13/1995, 26 y.o.   MRN: [id number]  SW met with pt and [name] in room to discuss discharge. Pt mother expressed concerns about him discharging to morrow as reported concerns about BP, and gait (tremors and right leg shaking), and was unsure if he should d/c tomorrow. SW informed will relay concerns to medical team and will follow-up. Also discussed d/c recs such as outpatient therapies, and OT recommended TTB. Pt mother reported they could not use item in current home due to bathroom being small, but they are moving in a few weeks to [facility] and he can use there. SW will order item.   SW ordered TTB with [employer] [facility] via parachute.   *Medical team reported pt should continue to wear thigh high ted hose and abdominal binder for continued regulation with BP, he will d/c home on midodrine, and pt currently CGA/supervision with RW.  SW spoke with [name] to inform on above. No questions/concerns reported.   SW faxed outpatient PT/OT referral to [facility] (p:[phone]/[phone]).  Loralee Pacas, MSW, [place] Office: [phone] Cell: [phone] Fax: [phone]

## 2021-11-02 NOTE — Progress Notes (Signed)
Occupational Therapy Discharge Summary ° °Patient Details  °Name: Jaime [name] °MRN: [id number] °Date of Birth: 06/18/1996 ° °Patient has met 9 of 9 long term goals due to improved activity tolerance, improved balance, and postural control.  Pt made steady progress with BADLs and functional tranfsers during this admission. Pt completes all BADLs and tranfsers with close supervision and occasional verbal cues for safety/technique. Pt dons his abd binder and thigh high ted hose without assistance. Pt's mom has been present for therapy sessions. Mom verbalized understanding of recommendation for 24 hour supervision for BADLs and functional tranfsers. Patient to discharge at overall Supervision level.  Patient's care partner is independent to provide the necessary physical assistance at discharge.   ° ° °Recommendation:  °Patient will benefit from ongoing skilled OT services in outpatient setting to continue to advance functional skills in the area of BADL and Reduce care partner burden. ° °Equipment: °TTB ° °Reasons for discharge: treatment goals met and discharge from hospital ° °Patient/family agrees with progress made and goals achieved: Yes ° °OT Discharge ° °Vision °Baseline Vision/History: 1 Wears glasses °Patient Visual Report: No change from baseline °Vision Assessment?: No apparent visual deficits °Perception  °Perception: Within Functional Limits °Praxis °Praxis: Intact °Cognition °Overall Cognitive Status: Within Functional Limits for tasks assessed °Arousal/Alertness: Awake/alert °Orientation Level: Oriented X4 °Year: 2023 °Month: January °Day of Week: Correct °Attention: Focused;Sustained °Focused Attention: Appears intact °Sustained Attention: Appears intact °Memory: Impaired °Memory Impairment: Decreased short term memory °Immediate Memory Recall: Sock;Blue;Bed °Memory Recall Sock: Without Cue °Memory Recall Blue: Without Cue °Memory Recall Bed: Without Cue °Awareness: Impaired °Awareness Impairment:  Anticipatory impairment °Problem Solving: Impaired °Safety/Judgment: Impaired °Sensation °Sensation °Light Touch: Impaired Detail °Light Touch Impaired Details: Impaired RLE;Impaired LLE °Proprioception: Impaired Detail °Proprioception Impaired Details: Impaired RLE;Impaired LLE °Coordination °Gross Motor Movements are Fluid and Coordinated: No °Fine Motor Movements are Fluid and Coordinated: No °Coordination and Movement Description: ataxic limbs + trunk °Finger Nose Finger Test: Ataxic bilaterally °Motor  °Motor °Motor: Abnormal tone;Ataxia;Abnormal postural alignment and control °Motor - Skilled Clinical Observations: ataxic limbs, impaired postural control during functional activity °Motor - Discharge Observations: ataxic limbs, impaired postural control-improved since eval °Trunk/Postural Assessment  °Cervical Assessment °Cervical Assessment: Exceptions to WFL (forward head) °Thoracic Assessment °Thoracic Assessment: Exceptions to WFL (rounded shoulders) °Lumbar Assessment °Lumbar Assessment: Within Functional Limits °Postural Control °Postural Control: Deficits on evaluation °Righting Reactions: delayed/insufficient  °Balance °Static Sitting Balance °Static Sitting - Balance Support: No upper extremity supported;Feet supported °Static Sitting - Level of Assistance: 7: Independent °Dynamic Sitting Balance °Dynamic Sitting - Balance Support: No upper extremity supported;Feet supported;During functional activity °Dynamic Sitting - Level of Assistance: 6: Modified independent (Device/Increase time) °Extremity/Trunk Assessment °RUE Assessment °RUE Assessment: Within Functional Limits °LUE Assessment °LUE Assessment: Within Functional Limits ° ° °[name], [name] °11/02/2021, 12:03 PM °

## 2021-11-02 NOTE — Progress Notes (Signed)
Occupational Therapy Session Note  Patient Details  Name: Jaime [name] MRN: [id number] Date of Birth: 18-Feb-1996  Today's Date: 11/02/2021 OT Individual Time: 1100-1154 OT Individual Time Calculation (min): 54 min    Short Term Goals: Week 1:  OT Short Term Goal 1 (Week 1): STGs=LTGs due to ELOS  Skilled Therapeutic Interventions/Progress Updates:    Pt resting in recliner upon arrival with Mom and CSW present. Discussed equipment needs (TTB) and recommendation for OPOT. OT intervention with focus on functional amb with RW, toileting and tranfsers, standing balance, and discharge planning (with Mom). Amb with RW in hallway with supervision. Pt requested to use toilet. Toilet transfer and toileting with supervision. Pt amb with RW in room to sink to wash hands. Pt leans against sink for standing balance. Pt returned to recliner. Discussed pt taking shower in afternoon shower and pt agreeable. Pt remained in recliner with all needs within reach and seat alarm activated.   Therapy Documentation Precautions:  Precautions Precautions: Fall Precaution Comments: Montior BP /HR Restrictions Weight Bearing Restrictions: No   Pain: Pain Assessment Pain Scale: 0-10 Pain Score: 0-No pain     Therapy/Group: Individual Therapy  Rich Brave 11/02/2021, 11:55 AM

## 2021-11-02 NOTE — Discharge Instructions (Addendum)
Inpatient Rehab Discharge Instructions  Jaime [name] Discharge date and time: No discharge date for patient encounter.   Activities/Precautions/ Functional Status: Activity: no lifting, driving, or strenuous exercise for until cleared by MD Diet: regular diet Wound Care: none needed Functional status:  ___ No restrictions     ___ Walk up steps independently ___ 24/7 supervision/assistance   ___ Walk up steps with assistance ___ Intermittent supervision/assistance  ___ Bathe/dress independently ___ Walk with walker     ___ Bathe/dress with assistance ___ Walk Independently    ___ Shower independently ___ Walk with assistance    __x_ Shower with assistance __x_ No alcohol     ___ Return to work/school ________   COMMUNITY REFERRALS UPON DISCHARGE:    Outpatient: PT     OT                 Agency:[facility]     Phone:[phone]             Address: [address] [place], [place], [place] [address]             Appointment Date/Time:*Please expect follow-up within 7-10 business days to schedule your appointment. If you have not received follow-up, be sure to contact the site directly.*  Medical Equipment/Items Ordered: tub transfer bench                                                  Agency/Supplier:  [facility] [phone]  Medical Equipment/Items Ordered: briefs, chux, gloves (incontinence supplies)                                                 Agency/Supplier:  [employer] # [phone]; Anise Salvo (liaison) [phone];  Special Instructions:  No driving, alcohol consumption or tobacco use.   My questions have been answered and I understand these instructions. I will adhere to these goals and the provided educational materials after my discharge from the hospital.  Patient/Caregiver Signature _______________________________ Date __________  Clinician Signature _______________________________________ Date __________  Please bring this form and your medication  list with you to all your follow-up doctor's appointments.

## 2021-11-02 NOTE — Progress Notes (Signed)
Occupational Therapy Note  Patient Details  Name: Jaime [name] MRN: [id number] Date of Birth: 10/12/1995  Today's Date: 11/02/2021 OT Missed Time: 30 Minutes Missed Time Reason: Other (comment) (eating lunch)  Pt missed 30 mins skilled OT services. Pt eating lunch. Pt ready for discharge tomorrow.   Lavone Neri [facility] 11/02/2021, 2:21 PM

## 2021-11-02 NOTE — Progress Notes (Signed)
Physical Therapy Session Note  Patient Details  Name: Jaime [name] MRN: [id number] Date of Birth: 11-16-1995  Today's Date: 11/02/2021 PT Individual Time: 0900-1000; 1610-1650 PT Individual Time Calculation (min): 60 min and 40 min  Short Term Goals: Week 1:  PT Short Term Goal 1 (Week 1): =LTG due to ELOS  Skilled Therapeutic Interventions/Progress Updates:    Session 1: Pt received seated in bed using urinal, able to continently void with use of urinal independently. Pt is able to don THT, abdominal binder, and shirt at bed level with setup A. Bed mobility mod I with increased time needed to complete. Seated BP 127/90. Pt's mom arrives during session for hands-on family education, asking if pt would benefit from extended LOS. Education that pt is at Supervision to [facility] level overall and would not benefit from a few added days on CIR vs continuing to make gains with OPPT. Sit to stand with Supervision and RW during session. Ambulation up to 150 ft during session with use of RW and close Supervision to CGA for balance. Pt does exhibit ongoing BLE ataxia and increased hip and knee flexion with gait. Car transfer with use of RW and Supervision cues for safe transfer technique. Ascend/descend 8 x 6" stairs with use of one handrail laterally and close Supervision to CGA for safety and balance. Pt's mom able to perform return demonstration of assisting pt with transfers and stair navigation. She was pt's caregiver prior to admission and is familiar with his CLOF and how to safely provide assist. Pt does report some dizziness following stair navigation, seated BP 113/84 and symptoms resolve with seated rest break. Educated pt and mom on continued use of THT and abdominal binder as well as symptom monitoring. Pt and his mom with no further questions at end of session. Pt left seated in recliner in room with needs in reach, mom present at end of session.  Session 2: Pt received seated in recliner in  room, agreeable to PT session. Pt requesting to shower this session. Sit to stand with Supervision to RW during session. Ambulatory transfer into bathroom and to sitting on shower chair with RW and close Supervision. Pt is able to doff all clothing items independently. Sit to stand in shower with Supervision and use of grab bar though bathing mostly performed at seated level with Supervision. Pt able to don clothing while seated on shower chair after drying off with increased time needed to complete clothing management. Ambulatory transfer back to recliner with RW and Supervision. Pt left seated in recliner in room with needs in reach, chair alarm in place.  Therapy Documentation Precautions:  Precautions Precautions: Fall Precaution Comments: Montior BP /HR Restrictions Weight Bearing Restrictions: No      Therapy/Group: Individual Therapy   Excell Seltzer, PT, DPT, CSRS  11/02/2021, 12:03 PM

## 2021-11-02 NOTE — Progress Notes (Signed)
Physical Therapy Discharge Summary  Patient Details  Name: Jaime [name] MRN: [id number] Date of Birth: Mar 03, 1996  Today's Date: 11/02/2021  Patient has met 8 of 8 long term goals due to improved activity tolerance, improved balance, improved postural control, and ability to compensate for deficits.  Patient to discharge at an ambulatory level  CGA with Supervision for transfers .   Patient's care partner is independent to provide the necessary physical and cognitive assistance at discharge. Pt's mom has completed hands-on family education and was caregiver for patient prior to admission so she is able to safely assist him upon d/c home.  Reasons goals not met: Pt has met all rehab goals.  Recommendation:  Patient will benefit from ongoing skilled PT services in outpatient setting to continue to advance safe functional mobility, address ongoing impairments in endurance, strength, balance, coordination, safety, independence with functional mobility, and minimize fall risk.  Equipment: No equipment provided. Pt already owns RW from previous CIR stay.  Reasons for discharge: treatment goals met and discharge from hospital  Patient/family agrees with progress made and goals achieved: Yes  PT Discharge Precautions/Restrictions Precautions Precautions: Fall Precaution Comments: Montior BP /HR Restrictions Weight Bearing Restrictions: No Pain Interference Pain Interference Pain Effect on Sleep: 0. Does not apply - I have not had any pain or hurting in the past 5 days Pain Interference with Therapy Activities: 1. Rarely or not at all Pain Interference with Day-to-Day Activities: 1. Rarely or not at all Vision/Perception  Vision - History Baseline Vision: Wears glasses all the time Perception Perception: Within Functional Limits Praxis Praxis: Intact  Cognition Overall Cognitive Status: Within Functional Limits for tasks assessed Arousal/Alertness: Awake/alert Orientation Level:  Oriented X4 Year: 2023 Attention: Focused;Sustained Focused Attention: Appears intact Sustained Attention: Appears intact Memory: Impaired Memory Impairment: Decreased short term memory Awareness: Impaired Awareness Impairment: Anticipatory impairment Problem Solving: Impaired Safety/Judgment: Impaired Sensation Sensation Light Touch: Impaired Detail Light Touch Impaired Details: Impaired RLE;Impaired LLE (impaired distally at baseline) Proprioception: Impaired Detail Proprioception Impaired Details: Impaired RLE;Impaired LLE Coordination Gross Motor Movements are Fluid and Coordinated: No Fine Motor Movements are Fluid and Coordinated: No Coordination and Movement Description: ataxic limbs + trunk Heel Shin Test: impaired B, R>L Motor  Motor Motor: Abnormal tone;Ataxia;Abnormal postural alignment and control Motor - Skilled Clinical Observations: ataxic limbs, impaired postural control during functional activity Motor - Discharge Observations: ataxic limbs, impaired postural control-improved since eval  Mobility Bed Mobility Bed Mobility: Rolling Right;Rolling Left;Supine to Sit;Sit to Supine Rolling Right: Independent with assistive device Rolling Left: Independent with assistive device Supine to Sit: Independent with assistive device Sit to Supine: Independent with assistive device Transfers Transfers: Sit to Stand;Stand Pivot Transfers Sit to Stand: Supervision/Verbal cueing Stand to Sit: Supervision/Verbal cueing Stand Pivot Transfers: Supervision/Verbal cueing Transfer (Assistive device): Rolling walker Locomotion  Gait Ambulation: Yes Gait Assistance: Contact Guard/Touching assist Gait Distance (Feet): 260 Feet Assistive device: Rolling walker Gait Gait: Yes Gait Pattern: Impaired Gait Pattern: Ataxic (inc B step length, inc B hip and knee flexion, flexed trunk) Gait velocity: decreased Stairs / Additional Locomotion Stairs: Yes Stairs Assistance: Contact  Guard/Touching assist Stair Management Technique: One rail Right;One rail Left;Sideways Number of Stairs: 8 Height of Stairs: 6 Wheelchair Mobility Wheelchair Mobility: No  Trunk/Postural Assessment  Cervical Assessment Cervical Assessment: Exceptions to [facility] (forward head) Thoracic Assessment Thoracic Assessment: Exceptions to [facility] (rounded shoulders) Lumbar Assessment Lumbar Assessment: Within Functional Limits Postural Control Postural Control: Deficits on evaluation Righting Reactions: delayed/insufficient  Balance Balance Balance Assessed:  Yes Static Sitting Balance Static Sitting - Balance Support: No upper extremity supported;Feet supported Static Sitting - Level of Assistance: 7: Independent Dynamic Sitting Balance Dynamic Sitting - Balance Support: No upper extremity supported;Feet supported;During functional activity Dynamic Sitting - Level of Assistance: 6: Modified independent (Device/Increase time) Static Standing Balance Static Standing - Balance Support: Bilateral upper extremity supported;During functional activity Static Standing - Level of Assistance: 5: Stand by assistance Dynamic Standing Balance Dynamic Standing - Balance Support: Bilateral upper extremity supported;During functional activity Dynamic Standing - Level of Assistance: 5: Stand by assistance;4: Min assist Extremity Assessment      RLE Assessment RLE Assessment: Within Functional Limits General Strength Comments: 4/5 grossly LLE Assessment LLE Assessment: Within Functional Limits General Strength Comments: 5/5 grossly     Excell Seltzer, PT, DPT, CSRS 11/02/2021, 7:57 AM

## 2021-11-02 NOTE — Progress Notes (Signed)
Occupational Therapy Session Note  Patient Details  Name: Jaime [name] MRN: [id number] Date of Birth: 12-31-1995  Today's Date: 11/02/2021 OT Individual Time: 1028-1050 OT Individual Time Calculation (min): 22 min ( make up time)   Short Term Goals: Week 1:  OT Short Term Goal 1 (Week 1): STGs=LTGs due to ELOS  Skilled Therapeutic Interventions/Progress Updates:  Pt greeted seated in recliner  agreeable to OT intervention to make up missed minutes. Session focus on functional mobility, dynamic standing balance, increasing overall activity tolerance for ADL participation and decreasing overall caregiver burden. Pt completed functional mobility greater than a household distance ( ~ 400 ft) with rw and CGA, no LOB during ambulation with one report of dizziness however pt did not report dizziness until after already sitting down and then states that feeling had resolved. Pts family member present during session observing ambulation level for ADL participation, she had no questions about functional mobility or ADLs however primary OT to f/u for family ed session directly after make up session.  pt left seated in recliner with all needs within reach.                   Therapy Documentation Precautions:  Precautions Precautions: Fall Precaution Comments: Montior BP /HR Restrictions Weight Bearing Restrictions: No    Pain: no pain reported during session     Therapy/Group: Individual Therapy  Barron Schmid 11/02/2021, 12:14 PM

## 2021-11-03 MED ORDER — VITAMIN D (ERGOCALCIFEROL) 1.25 MG (50000 UNIT) PO CAPS: 50000.0000 [IU] | ORAL_CAPSULE | ORAL | 0 refills | Status: AC

## 2021-11-03 NOTE — Progress Notes (Signed)
PROGRESS NOTE   Subjective/Complaints:   Pt reports doing well- really excited gets to go home-  Admits has tremors, but doesn't feel they are "too bad" right now.   EQA:STMHDQQ by sedation/cognition   Objective:   No results found. No results for input(s): WBC, HGB, HCT, PLT in the last 72 hours.  No results for input(s): NA, K, CL, CO2, GLUCOSE, BUN, CREATININE, CALCIUM in the last 72 hours.   Intake/Output Summary (Last 24 hours) at 11/03/2021 0833 Last data filed at 11/03/2021 0552 Gross per 24 hour  Intake 355 ml  Output 700 ml  Net -345 ml        Physical Exam: Vital Signs Blood pressure 113/80, pulse (!) 101, temperature 98.2 F (36.8 C), resp. rate 18, height 5\' 9"  (1.753 m), weight 53.9 kg, SpO2 98 %.       General: awake, alert, appropriate, supine; just woke up; takes awhile to get going; NAD HENT: conjugate gaze; oropharynx moist CV: regular rhythm; borderline tachycardia; ; no JVD Pulmonary: CTA B/L; no W/R/R- good air movement GI: soft, NT, ND, (+)BS Psychiatric: appropriate- flat Neurological: sleepy;just woke up;  Ext: no clubbing, cyanosis, or edema Psych: very flat, difficult to engage Neurological: Ox3; more tremors seen this AM Skin: intact Neuro/Musculoskeletal: Alert, 5/5 strength throughout - checked DF and PF this AM- up to 5-/5 B/L . Sensation intact.      Assessment/Plan: 1. Functional deficits which require 3+ hours per day of interdisciplinary therapy in a comprehensive inpatient rehab setting. Physiatrist is providing close team supervision and 24 hour management of active medical problems listed below. Physiatrist and rehab team continue to assess barriers to discharge/monitor patient progress toward functional and medical goals  Care Tool:  Bathing  Bathing activity did not occur:  (not assessed due to time constraints) Body parts bathed by patient: Right arm, Left  arm, Chest, Abdomen, Front perineal area, Buttocks, Right upper leg, Left upper leg, Right lower leg, Left lower leg, Face         Bathing assist Assist Level: Supervision/Verbal cueing     Upper Body Dressing/Undressing Upper body dressing   What is the patient wearing?: Pull over shirt    Upper body assist Assist Level: Independent    Lower Body Dressing/Undressing Lower body dressing      What is the patient wearing?: Pants     Lower body assist Assist for lower body dressing: Supervision/Verbal cueing     Toileting Toileting    Toileting assist Assist for toileting: Supervision/Verbal cueing     Transfers Chair/bed transfer  Transfers assist     Chair/bed transfer assist level: Supervision/Verbal cueing Chair/bed transfer assistive device:   Ambulation assist      Assist level: Contact Guard/Touching assist Assistive device: Walker-rolling Max distance: 260'   Walk 10 feet activity   Assist     Assist level: Contact Guard/Touching assist Assistive device: Walker-rolling   Walk 50 feet activity   Assist    Assist level: Contact Guard/Touching assist Assistive device: Walker-rolling    Walk 150 feet activity   Assist Walk 150 feet activity did not occur: Safety/medical concerns  Assist level:  Contact Guard/Touching assist Assistive device: Walker-rolling    Walk 10 feet on uneven surface  activity   Assist Walk 10 feet on uneven surfaces activity did not occur: Safety/medical concerns   Assist level: Contact Guard/Touching assist Assistive device: Walker-rolling   Wheelchair     Assist Is the patient using a wheelchair?: No Type of Wheelchair: Manual (per PT documentation)    Wheelchair assist level: Dependent - Patient 0% Max wheelchair distance: 150'    Wheelchair 50 feet with 2 turns activity    Assist        Assist Level: Dependent - Patient 0%   Wheelchair 150 feet  activity     Assist      Assist Level: Dependent - Patient 0%   Blood pressure 113/80, pulse (!) 101, temperature 98.2 F (36.8 C), resp. rate 18, height 5\' 9"  (1.753 m), weight 53.9 kg, SpO2 98 %.  Medical Problem List and Plan: 1. Functional deficits secondary to multiple sclerosis flare and lower extremity weakness             -patient may shower             -ELOS/Goals: 5-7 days S            d/c date 1/31-  D/c today- will need f/u with Neuro about MS and tremors 2.  Antithrombotics: -DVT/anticoagulation:  Pharmaceutical: Lovenox             -antiplatelet therapy: None 3. Pain Management: Tylenol, baclofen as needed 4. Mood: LCSW to evaluate and provide emotional support. Continue Cymbalta             -antipsychotic agents: n/a 5. Neuropsych: This patient is capable of making decisions on his own behalf. 6. Skin/Wound Care: Routine skin care checks 7. Fluids/Electrolytes/Nutrition: Routine Is and Os and follow-up chemistries 8: Autism spectrum disorder 9: Depression: Continue Cymbalta. 1/28- controlled overall- con't regimen, mood baseline?  10: Underweight: BMI 17.55: encourage adequate nutrition.  1/24- pt needs constant reminders to eat- started peeling egg this AM, but stil hasn't eaten anything when left.  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 1/30- also has tremors; from MS- con't meds   12. Severe vitamin D deficiency: start ergocalciferol 50,000U once per week for 7 weeks. 13. Hyperglycemia: HgbA1c is 5.3. Educated regarding avoiding added sugar in diet.  14. 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  1/27- had red MEWS yesterday due to tachycardia/hypotension- will increase midodrine to home dose since won't wear abd binder regularly. If tachycardia  continues, might need a low dose Beta blocker?  1/28 bp's look a little more robust today and HR around 90   -continue with current midodrine  1/30- BP's look good and HR 90s- low 100s- therapy tolerated by pt-  15. Urinary incontinence  1/27- due to MS has urinary urgency and intermittent incontinence- will need incontinence supplies at home- will write for these.  1/31- will need Pull up's/disposable underwear, underpads/chux, and gloves- 120/2/60 for numbers of items.        LOS: 10 days A FACE TO FACE EVALUATION WAS PERFORMED  Jaime [name] 11/03/2021, 8:33 AM

## 2021-11-03 NOTE — Progress Notes (Signed)
Patient ID: Jaime [name], male   DOB: 1996-02-15, 26 y.o.   MRN: [id number]  SW faxed order and clinicals to [facility]([phone]/f:[phone]) for his incontinence supplies.   Loralee Pacas, MSW, [place] Office: [phone] Cell: [phone] Fax: [phone]

## 2021-11-03 NOTE — Progress Notes (Signed)
Inpatient Rehabilitation Discharge Medication Review by a Pharmacist ° °A complete drug regimen review was completed for this patient to identify any potential clinically significant medication issues. ° °High Risk Drug Classes Is patient taking? Indication by Medication  °Antipsychotic No   °Anticoagulant No    °Antibiotic No   °Opioid No   °Antiplatelet No   °Hypoglycemics/insulin No   °Vasoactive Medication Yes Midodrine- blood pressure  °Chemotherapy No   °Other Yes Baclofen - Muscle spasms °Duloxetine - Depression ° Vitamin D- Vit D deficiency  ° ° ° °Type of Medication Issue Identified Description of Issue Recommendation(s)  °Drug Interaction(s) (clinically significant) °    °Duplicate Therapy °    °Allergy °    °No Medication Administration End Date °    °Incorrect Dose °    °Additional Drug Therapy Needed °    °Significant med changes from prior encounter (inform family/care partners about these prior to discharge).    °Other °    ° ° °Clinically significant medication issues were identified that warrant physician communication and completion of prescribed/recommended actions by midnight of the next day:  No ° °Time spent performing this drug regimen review (minutes):  20 ° ° °Noura Purpura BS, PharmD, BCPS °Clinical Pharmacist °11/03/2021 9:14 AM  °Please check AMION for all [facility] Pharmacy phone numbers °After 10:00 PM, call Main Pharmacy [phone] ° °

## 2021-11-04 NOTE — Progress Notes (Signed)
Inpatient Rehabilitation Care Coordinator Discharge Note   Patient Details  Name: Jaime [name] MRN: [id number] Date of Birth: [birth year]   Discharge location: D/c to home with his mother  Length of Stay: 9 days  Discharge activity level: ambulatory level  CGA with Supervision for transfers  Home/community participation: Limited  Patient response EH:UDJSHF Literacy - How often do you need to have someone help you when you read instructions, pamphlets, or other written material from your doctor or pharmacy?: Rarely  Patient response WY:OVZCHY Isolation - How often do you feel lonely or isolated from those around you?: Never  Services provided included: MD, RD, PT, OT, RN, CM, Pharmacy, Neuropsych, SW, TR  Financial Services:  Financial Services Utilized: Medicaid    Choices offered to/list presented to: Yes  Follow-up services arranged:  Outpatient, DME    Outpatient Servicies: [facility] for outpatient PT/OT DME : [facility] for TTB    Patient response to transportation need: Is the patient able to respond to transportation needs?: Yes In the past 12 months, has lack of transportation kept you from medical appointments or from getting medications?: No In the past 12 months, has lack of transportation kept you from meetings, work, or from getting things needed for daily living?: No   Comments (or additional information):  Patient/Family verbalized understanding of follow-up arrangements:  Yes  Individual responsible for coordination of the follow-up plan: contact pt mother Dawn  Confirmed correct DME delivered: Gretchen Short 11/04/2021    Gretchen Short

## 2021-11-05 NOTE — Progress Notes (Signed)
PMR Admission Coordinator Pre-Admission Assessment   Patient: Jaime [name] is an 26 y.o., male MRN: [id number] DOB: 13-Feb-1996 Height: '5\' 9"'  (175.3 cm) Weight: 51.3 kg   Insurance Information HMO:     PPO:      PCP:      IPA:      80/20:      OTHER:  PRIMARY: Medicaid [place] Access      Policy#: [id number]      Subscriber: pt CM Name:       Phone#:      Fax#:  Pre-Cert#: verified on the phone      Employer:  Benefits:  Phone #:      Name:  Eff. Date: active as of 10/23/21     Deduct: $0      Out of Pocket Max: $0      Life Max:  CIR:       SNF:  Outpatient:      Co-Pay:  Home Health:       Co-Pay:  DME:      Co-Pay:  Providers:  SECONDARY:       Policy#:      Phone#:    Development worker, community:       Phone#:    The Actuary for patients in Inpatient Rehabilitation Facilities with attached Privacy Act Spray Records was provided and verbally reviewed with:    Emergency Contact Information Contact Information       Name Relation Home Work Mobile    [place] Mother [phone]   [phone]    Fadi, Menter Father     [phone]    Alesandro, Stueve     [phone]           Current Medical History  Patient Admitting Diagnosis: MS exacerbation   History of Present Illness: Jaime [name] is a 26 year old male who presented to [facility] emergency department on 10/18/2021 with increasing bilateral lower extremity weakness preceding the previous 1 to 2 weeks.  This was accompanied by slurred speech and mild drooping of the left side of his face.  He has a history of multiple sclerosis and this was thought to be an MS flare.  As part of his admission work-up, MRI of the brain and C-spine were completed which noted extensive multiple sclerosis with multiple new demyelinating lesions in the brain and spinal cord.  Neurology was consulted and the patient was placed on high-dose steroid therapy.  CT head unremarkable.  On presentation, he reported multiple falls without injuries.  He denied upper extremity weakness, voiding difficulty or vision changes.  The patient is followed as an outpatient by Dr. Felecia Shelling, and has received Tysabri (anatalizumab) infusions.  His last infusion was Tysabri 300 mg on 10/15/2021.  The patient at that time endorsed increased weakness in his right leg for approximately 1 week.  A follow-up appoint with Dr. Felecia Shelling was arranged.  There are medical conditions include depression maintained on Cymbalta.  He has a history of autism spectrum disorder. He denies alcohol, tobacco or illicit drug use.  Therapy evaluations completed and pt was recommended for CIR.    Patient's medical record from [facility] has been reviewed by the rehabilitation admission coordinator and physician.   Past Medical History      Past Medical History:  Diagnosis Date   Autism     Depression     MS (multiple sclerosis) ([place])        Has the patient had major surgery during 100  days prior to admission? No   Family History   family history includes Clotting disorder in his mother.   Current Medications   Current Facility-Administered Medications:    0.9 %  sodium chloride infusion, 250 mL, Intravenous, PRN, Chotiner, Yevonne Aline, MD, Last Rate: 10 mL/hr at 10/23/21 1430, 50 mL at 10/23/21 1430   acetaminophen (TYLENOL) tablet 650 mg, 650 mg, Oral, Q6H PRN **OR** [DISCONTINUED] acetaminophen (TYLENOL) suppository 650 mg, 650 mg, Rectal, Q6H PRN, Chotiner, Yevonne Aline, MD   baclofen (LIORESAL) tablet 5 mg, 5 mg, Oral, TID, Swayze, Ava, DO, 5 mg at 10/23/21 1003   docusate sodium (COLACE) capsule 100 mg, 100 mg, Oral, TID PRN, Swayze, Ava, DO, 100 mg at 10/22/21 1459   DULoxetine (CYMBALTA) DR capsule 60 mg, 60 mg, Oral, Daily, Swayze, Ava, DO, 60 mg at 10/23/21 1003   enoxaparin (LOVENOX) injection 40 mg, 40 mg, Subcutaneous, Q24H, Swayze, Ava, DO, 40 mg at 10/22/21 2142   methylPREDNISolone sodium succinate  (SOLU-MEDROL) 1,000 mg in sodium chloride 0.9 % 50 mL IVPB, 1,000 mg, Intravenous, Q24H, Last Rate: 66 mL/hr at 10/23/21 1431, 1,000 mg at 10/23/21 1431 **AND** [COMPLETED] pantoprazole (PROTONIX) injection 40 mg, 40 mg, Intravenous, Q24H, Donnetta Simpers, MD, 40 mg at 10/23/21 1213   ondansetron (ZOFRAN) tablet 4 mg, 4 mg, Oral, Daily PRN, Swayze, Ava, DO   sodium chloride flush (NS) 0.9 % injection 3 mL, 3 mL, Intravenous, Q12H, Chotiner, Yevonne Aline, MD, 3 mL at 10/23/21 1046   sodium chloride flush (NS) 0.9 % injection 3 mL, 3 mL, Intravenous, PRN, Chotiner, Yevonne Aline, MD   Patients Current Diet:  Diet Order                  Diet regular Room service appropriate? Yes; Fluid consistency: Thin  Diet effective now                         Precautions / Restrictions Precautions Precautions: Fall Precaution Comments: [place] BP /HR Restrictions Weight Bearing Restrictions: No    Has the patient had 2 or more falls or a fall with injury in the past year? Yes   Prior Activity Level Limited Community (1-2x/wk): went out for appointments, doesn't drive, occasional use of DME if needed; does not need assist for basic mobility and ADLs, does need assist for IADLs   Prior Functional Level Self Care: Did the patient need help bathing, dressing, using the toilet or eating? Independent   Indoor Mobility: Did the patient need assistance with walking from room to room (with or without device)? Independent   Stairs: Did the patient need assistance with internal or external stairs (with or without device)? Independent   Functional Cognition: Did the patient need help planning regular tasks such as shopping or remembering to take medications? Needed some help   Patient Information Are you of Hispanic, Latino/a,or Spanish origin?: A. No, not of Hispanic, Latino/a, or Spanish origin What is your race?: B. Black or African American Do you need or want an interpreter to communicate with a  doctor or health care staff?: 0. No   Patient's Response To:  Health Literacy and Transportation Is the patient able to respond to health literacy and transportation needs?: Yes Health Literacy - How often do you need to have someone help you when you read instructions, pamphlets, or other written material from your doctor or pharmacy?: Often In the past 12 months, has lack of transportation kept you from medical  appointments or from getting medications?: Yes In the past 12 months, has lack of transportation kept you from meetings, work, or from getting things needed for daily living?: Yes   [place] / Equipment Home Equipment: Conservation officer, nature (2 wheels)   Prior Device Use: Indicate devices/aids used by the patient prior to current illness, exacerbation or injury?  Occasional RW    Current Functional Level Cognition   Overall Cognitive Status: Within Functional Limits for tasks assessed Orientation Level: Oriented X4 General Comments: has history of autism.    Extremity Assessment (includes Sensation/Coordination)   Upper Extremity Assessment: Defer to OT evaluation RUE Deficits / Details: noted to have more ataxic movements on this UE with attemtps at finger to nose touch. unable to hold one UE up at same time for test so threapist hand was used. RUE Coordination: decreased fine motor, decreased gross motor LUE Deficits / Details: noted to have some ataxic movements but noted to be able to touch finger to nose test easier using therapist hand.  Lower Extremity Assessment: LLE deficits/detail, RLE deficits/detail RLE Deficits / Details: strong increased extensor tone with movement, plantar flexed ankle, patient breaks  the tone with left leg by pushing back, patient not clear about the sensation, appreciates LT, ataxix movemnts, uncontrolled when steps LLE Deficits / Details: ataxic . Less tone than the right  decreased control, patient reports some  decreased feeling, not  specific     ADLs   Overall ADL's : Needs assistance/impaired Eating/Feeding: Supervision/ safety, Sitting Eating/Feeding Details (indicate cue type and reason): in recliner Grooming: Wash/dry face, Sitting, Minimal assistance Upper Body Bathing: Moderate assistance, Sitting Lower Body Bathing: Sit to/from stand, Sitting/lateral leans, Maximal assistance Upper Body Dressing : Sitting, Moderate assistance Lower Body Dressing: Sit to/from stand, Sitting/lateral leans, Total assistance Toilet Transfer: +2 for physical assistance, +2 for safety/equipment, Moderate assistance Toilet Transfer Details (indicate cue type and reason): patient simulated transfer to recliner with RW use. patient was noted to take large step forwards then when corrected changed to large step towards this Probation officer. patient was able to complete transfer with increased time and +2 for safety. Toileting- Clothing Manipulation and Hygiene: Total assistance, Sit to/from stand Functional mobility during ADLs: +2 for safety/equipment, +2 for physical assistance     Mobility   Overal bed mobility: Needs Assistance Bed Mobility: Supine to Sit Supine to sit: Supervision General bed mobility comments: patient needed physical assistance to move BLE off edge of bed and bring trunk into midline.Noted increased extensor tone of the right leg, Once seated, right knee in extension. Patient used left leg to push the right leg back into flexion.     Transfers   Overall transfer level: Needs assistance Equipment used: Rolling walker (2 wheels) Transfers: Sit to/from Stand Sit to Stand: Min assist, +2 safety/equipment Bed to/from chair/wheelchair/BSC transfer type:: Step pivot General transfer comment: Performed x 2 during session; light min A to steady; vitals monitored (see comments)     Ambulation / Gait / Stairs / Wheelchair Mobility   Ambulation/Gait Ambulation/Gait assistance: Herbalist (Feet): 100 Feet Assistive  device: Rolling walker (2 wheels) Gait Pattern/deviations: Step-through pattern, Decreased stride length, Narrow base of support, Ataxic, Scissoring General Gait Details: Pt with tendency to circumduct, scissor, and ataxia on R side.  Improved with mod cues for increased BOS.  Also, with tendency to get too close to RW - corrected with cues.  Min A to steady and control RW; mod Cues Gait velocity: decreased  Posture / Balance Balance Overall balance assessment: Needs assistance Sitting-balance support: Feet supported, No upper extremity supported Sitting balance-Leahy Scale: Good Postural control: Posterior lean Standing balance support: Reliant on assistive device for balance, Bilateral upper extremity supported Standing balance-Leahy Scale: Poor Standing balance comment: Using RW and min A at times     Special needs/care consideration Behavioral consideration autism spectrum    Previous Home Environment (from acute therapy documentation) Living Arrangements: Parent Available Help at Discharge: Family, Available 24 hours/day Type of Home: Apartment Home Layout: Two level, Bed/bath upstairs Alternate Level Stairs-Rails: Right Alternate Level Stairs-Number of Steps: 6-8 Home Access: Stairs to enter Entrance Stairs-Rails: Can reach both Entrance Stairs-Number of Steps: 4 with rail in front and 2 without rail in back [employer] Shower/Tub: Multimedia programmer: Standard   Discharge Living Setting Plans for Discharge Living Setting: Lives with (comment) (family) Type of Home at Discharge: Apartment Discharge Home Layout: Two level, Bed/bath upstairs Alternate Level Stairs-Rails: Left Alternate Level Stairs-Number of Steps: flight Discharge Home Access: Stairs to enter Entrance Stairs-Rails: Right, Left Entrance Stairs-Number of Steps: 2+1 Discharge Bathroom Shower/Tub: Walk-in shower Discharge Bathroom Toilet: Standard Discharge Bathroom Accessibility: Yes How  Accessible: Accessible via walker Does the patient have any problems obtaining your medications?: No   Social/Family/Support Systems Anticipated Caregiver: Izell [place] (dad), Dawn (mom), brothers and sisters Anticipated Caregiver's Contact Information: Izell Inkerman [phone]; Arrie Aran [phone] Ability/Limitations of Caregiver: n/a Caregiver Availability: 24/7 Discharge Plan Discussed with Primary Caregiver: Yes Is Caregiver In Agreement with Plan?: Yes Does Caregiver/Family have Issues with Lodging/Transportation while Pt is in Rehab?: No   Goals Patient/Family Goal for Rehab: PT/OT supervision to mod I; SLP n/a Expected length of stay: 14-16 days Additional Information: with [name] previously Pt/Family Agrees to Admission and willing to participate: Yes Program Orientation Provided & Reviewed with Pt/Caregiver Including Roles  & Responsibilities: Yes   Decrease burden of Care through IP rehab admission: n/a   Possible need for SNF placement upon discharge: n/a   Patient Condition: I have reviewed medical records from [place], spoken with CM, and patient and family member. I met with patient at the bedside for inpatient rehabilitation assessment.  Patient will benefit from ongoing PT and OT, can actively participate in 3 hours of therapy a day 5 days of the week, and can make measurable gains during the admission.  Patient will also benefit from the coordinated team approach during an Inpatient Acute Rehabilitation admission.  The patient will receive intensive therapy as well as Rehabilitation physician, nursing, social worker, and care management interventions.  Due to safety, disease management, medication administration, pain management, and patient education the patient requires 24 hour a day rehabilitation nursing.  The patient is currently max +2 with mobility and basic ADLs.  Discharge setting and therapy post discharge at home with outpatient is anticipated.  Patient has agreed to  participate in the Acute Inpatient Rehabilitation Program and will admit Saturday 1/21.   Preadmission Screen Completed By:  Michel Santee, PT, DPT 10/23/2021 2:54 PM ______________________________________________________________________   Discussed status with Dr. Ranell Patrick on 10/23/21  at 2:57 PM and received approval for admission today.   Admission Coordinator:  Michel Santee, PT, DPT time 2:57 PM Sudie Grumbling 10/23/21     Assessment/Plan: Diagnosis: Multiple sclerosis  Does the need for close, 24 hr/day Medical supervision in concert with the patient's rehab needs make it unreasonable for this patient to be served in a less intensive setting? Yes Co-Morbidities requiring supervision/potential complications: underweight, lower extremity  weakness, Autism spectrum disorder, tachycardia, hypokalemia, slurred speech Due to bladder management, bowel management, safety, skin/wound care, disease management, medication administration, pain management, and patient education, does the patient require 24 hr/day rehab nursing? Yes Does the patient require coordinated care of a physician, rehab nurse, PT, OT, and SLP to address physical and functional deficits in the context of the above medical diagnosis(es)? Yes Addressing deficits in the following areas: balance, endurance, locomotion, strength, transferring, bowel/bladder control, bathing, dressing, feeding, grooming, toileting, cognition, speech, and psychosocial support Can the patient actively participate in an intensive therapy program of at least 3 hrs of therapy 5 days a week? Yes The potential for patient to make measurable gains while on inpatient rehab is excellent Anticipated functional outcomes upon discharge from inpatient rehab: supervision PT, supervision OT, supervision SLP Estimated rehab length of stay to reach the above functional goals is: 5-7 days Anticipated discharge destination: Home 10. Overall Rehab/Functional Prognosis:  excellent     MD Signature: Leeroy Cha, MD

## 2021-11-23 ENCOUNTER — Telehealth (HOSPITAL_COMMUNITY): Payer: Self-pay | Admitting: *Deleted

## 2021-11-23 NOTE — Telephone Encounter (Signed)
Spoke with Kara Mead, RN  at [facility] via secure chat. Inquired if patient can receive Tysabri infusion tomorrow as scheduled. Patient was suppose to follow up with Dr. Epimenio Foot prior to tomorrow's infusion but was hospitalized and missed his follow-up appointment. Per Dr. Epimenio Foot, patient can receive his Tysabri infusion tomorrow but must have an exam and labs with neurology before he can received any additional infusions.   Patient is scheduled for  appointment with Dr. Epimenio Foot on March 2nd.

## 2021-11-24 ENCOUNTER — Encounter (HOSPITAL_COMMUNITY): Payer: Medicaid Other

## 2021-12-03 ENCOUNTER — Encounter: Payer: Self-pay | Admitting: Neurology

## 2021-12-03 ENCOUNTER — Ambulatory Visit: Payer: Medicaid Other | Admitting: Neurology

## 2021-12-03 VITALS — BP 113/76 | HR 92

## 2021-12-03 DIAGNOSIS — G90A Postural orthostatic tachycardia syndrome (POTS): Secondary | ICD-10-CM | POA: Diagnosis not present

## 2021-12-03 DIAGNOSIS — Z79899 Other long term (current) drug therapy: Secondary | ICD-10-CM | POA: Diagnosis not present

## 2021-12-03 DIAGNOSIS — G35 Multiple sclerosis: Secondary | ICD-10-CM | POA: Diagnosis not present

## 2021-12-03 DIAGNOSIS — F840 Autistic disorder: Secondary | ICD-10-CM | POA: Diagnosis not present

## 2021-12-03 DIAGNOSIS — R2689 Other abnormalities of gait and mobility: Secondary | ICD-10-CM

## 2021-12-03 NOTE — Progress Notes (Signed)
[facility]  PATIENT: Jaime [name] DOB: Feb 21, 1996  REFERRING DOCTOR OR PCP: Delbert Harness, MD SOURCE: Patient, notes from hospital, imaging and lab reports, MRI images personally reviewed.  _________________________________   HISTORICAL  CHIEF COMPLAINT:  Chief Complaint  Patient presents with   New Patient (Initial Visit)    Rm 2 with mom- here for f/u on MS went to the ED for flare up. Reports trouble walking. Scheduled for next tysabri on 12/11/21.     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     Update  12/03/2021: He is on Tysabri.    He is JCV Ab negative (0.16 n 01/22/2021).  According to his mother.  He had a dose in October, December and January but missed November.  However, it looks like he may have missed some additional doses, looking at the chart.  He went to the ED with a possible exacerbation 10/18/2021.  He was having more trouble walking and was slurring his words.   He had trouble lifting the left leg.     He received 5 days IV Solumedrol and did PT.    He is much better and is able to walk up and down stairs now using the rail.     MRI of the brain and cervical spine 10/19/2021 showed at least 9 supratentorial foci in the brain.    Cord showed old foci - none enhancing but some not present in 2022 MRI.    Around the house he uses furniture for balance or a walker.   He has no recent falls.   He feels hands are strong but he has some clumsiness and tremor. He has some spasticity helped by baclofen.     He has ankle clonus when leg flexed backwards   He has some urinary dribble incontinence but no frank incontinence.   He has constipation.    He sleeps well most nights now.    He notes mild depression and anxiety.   He feels cognition is doing about the same as before the MS but he has some word finding slowness at times - better than earlir.   His great great uncle had MS.     He has medical issues include  POTS, autism spectrum disorder and depression.   Sertraline was prescribed for mood and depression but he prefers not to take it.    He has not had Hepatitis, TB.  He did have Scarlet Fever.   He had Chicken pox at age 26 or 40.     MS history: He was diagnosed with MS March 2021 after presenting with poor balance, disorientation.  At the time, he was walking his dog and collapsed prompting a visit to the ED.   MRI's of the head and spine showed multiple lesions c/w MS.   In retrospect, he had some imbalance in February but was walking at his baseline January 2021.   Multiple brain and T-spine lesions enhanced.   He ws admitted and received 5 days of IV solu-medrol.   He improved but not to baseline.  He did outpatient rehab.  He never saw neurology as outpatient.    In August 2021, he began to have more difficulty with his gait and re-presented to the ED 05/19/2020.    It showed numerous enhancing lesions and he was admitted for 5 more days of IV Solu-medrol.    He improved some but is not at baseline.  Imaging  review: MRI of the brain 10/19/2021 shows multiple T2/FLAIR hyperintense foci in the periventricular, juxtacortical and deep white matter of the hemispheres, medulla, pons, middle cerebellar peduncles, cerebellum, midbrain, thalamus about 8 or 9 foci in the hemispheres enhance after contrast consistent with acute lesions.  None of the brainstem lesions appear to enhance.  MRI of the cervical spine 10/19/2021 shows multiple T2 hyperintense foci in the spinal cord.  A large 1 is located centrally and posteriorly from C1-C2 to C3.  Focus to the right adjacent to C2-C3, posteriorly at C3-C4, focus to the right adjacent to C4, anterolaterally to the right adjacent to C5, posteriorly adjacent to C5-C6, centrally adjacent to C7.  Foci also in the thoracic spine.  None of these enhance.  Most of these appear to be present on the MRI from 2021.  MRI brain 07/15/2020 shows multiple T2/FLAIR hyperintense foci in  the pons, middle cerebral hemispheres, cerebellum, spinal cord, medulla, thalamus and in the periventricular, juxtacortical and deep white matter of the hemispheres.  At least a dozen foci in the hemispheres enhance after contrast consistent with acute demyelination.  These and some other foci are new compared to the 05/19/2020 MRI.  MRI of the cervical spine 07/15/2020 shows multiple T2 hyperintense foci in the spinal cord.  None of the foci enhance after contrast.  Some foci are new compared to the 12/11/2019 MRI.  MRI of the thoracic spine 07/15/2020 showed multiple T2 hyperintense foci in the thoracic spinal cord.  1 focus at T4 appears to be enhancing.  There are several new lesions compared to the 12/16/2019 MRI.  MRI of the brain 05/19/2020 showed multiple T2/FLAIR hyperintense foci in the periventricular, juxtacortical and deep white matter.  There are also foci in the cerebellum, left middle cerebellar peduncle and brainstem.  Multiple lesions enhance including some in the hemispheres, cerebellum and middle cerebellar peduncle.  Foci that enhanced on the March 2021 MRI appear smaller and do not enhance  MRI of the brain  12/16/2019 shows Multiple T2/FLAIR hyperintense foci in the periventricular, juxtacortical and deep white matter of the hemispheres.  Additionally, foci are noted in the brainstem and cerebellum enhancement.  MRI of the brain 12/11/2019 shows multiple T2/FLAIR hyperintense foci in the periventricular, juxtacortical and deep white matter of the hemispheres.  Many of the periventricular foci are radially oriented to the ventricles.  Foci are noted in the deep gray matter, brainstem and cerebellar peduncles multiple lesions enhance.  MRI of the cervical spine 12/11/2019 shows extensive T2 hyperintense foci in the spinal cord the most prominent focus is posteriorly to the right at C2.  None of the foci enhanced.  MRI of the thoracic spine 12/11/2019 showed multiple T2 hyperintense foci within  the spinal cord.  There was enhancement at T1-T2 and T10-T11.  MRI of the lumbar spine 12/11/2019 showed some demyelination in the distal spinal cord but no enhancement.  There is a small disc protrusion at L5-S1 that does not cause nerve root compression or spinal stenosis.  Laboratory tests: 06/19/2020: Hep B surface antibody positive.  Hep B surface antigen and hep C core antibody are negative.   QuantiFERON-TB is negative VZV IgG shows that he is not immune to varicella.  Labs done March 2021 admission: RPR negative, HIV negative, ACE normal.  NMO antibody was negative.  SARS-CoV-2 PCR was negative.  Cerebrospinal fluid December 12, 2019: IgG index 1.1 (this is very elevated consistent with MS, oligoclonal bands was not performed).  CSF was otherwise normal. .  REVIEW OF SYSTEMS: Constitutional: No fevers, chills, sweats, or change in appetite.  He notes fatigue Eyes: No visual changes, double vision, eye pain Ear, nose and throat: No hearing loss, ear pain, nasal congestion, sore throat Cardiovascular: No chest pain, palpitations Respiratory:  No shortness of breath at rest or with exertion.   No wheezes GastrointestinaI: No nausea, vomiting, diarrhea, abdominal pain, fecal incontinence Genitourinary:  No dysuria, urinary retention or frequency.  No nocturia. Musculoskeletal:  No neck pain, back pain Integumentary: No rash, pruritus, skin lesions Neurological: as above Psychiatric: As above Endocrine: No palpitations, diaphoresis, change in appetite, change in weigh or increased thirst Hematologic/Lymphatic:  No anemia, purpura, petechiae. Allergic/Immunologic: No itchy/runny eyes, nasal congestion, recent allergic reactions, rashes  ALLERGIES: No Known Allergies  HOME MEDICATIONS:  Current Outpatient Medications:    acetaminophen (TYLENOL) 500 MG tablet, Take 1,000 mg by mouth every 6 (six) hours as needed for headache (pain)., Disp: , Rfl:    Baclofen 5 MG TABS, Take 5 mg by  mouth 3 (three) times daily., Disp: 90 tablet, Rfl: 11   docusate sodium (COLACE) 100 MG capsule, Take 1 capsule (100 mg total) by mouth 3 (three) times daily. (Patient taking differently: Take 100 mg by mouth 3 (three) times daily as needed (constipation).), Disp: 10 capsule, Rfl: 0   DULoxetine (CYMBALTA) 60 MG capsule, Take 1 capsule (60 mg total) by mouth daily. (Patient taking differently: Take 60 mg by mouth daily as needed (depression/sadness (give zofran with each dose)).), Disp: 90 capsule, Rfl: 5   midodrine (PROAMATINE) 10 MG tablet, Take 1 tablet (10 mg total) by mouth 2 (two) times daily with a meal., Disp: 60 tablet, Rfl: 11   ondansetron (ZOFRAN) 4 MG tablet, Take 1 tablet (4 mg total) by mouth every 8 (eight) hours as needed for nausea or vomiting. (Patient taking differently: Take 4 mg by mouth daily as needed (with each dose of Cymbalta (duloxetine)).), Disp: 60 tablet, Rfl: 0  PAST MEDICAL HISTORY: Past Medical History:  Diagnosis Date   Autism    Depression    MS (multiple sclerosis) (HCC)     PAST SURGICAL HISTORY: Past Surgical History:  Procedure Laterality Date   NO PAST SURGERIES      FAMILY HISTORY: Family History  Problem Relation Age of Onset   Clotting disorder Mother     SOCIAL HISTORY:  Social History   Socioeconomic History   Marital status: Single    Spouse name: Not on file   Number of children: Not on file   Years of education: Not on file   Highest education level: Not on file  Occupational History   Not on file  Tobacco Use   Smoking status: Never   Smokeless tobacco: Never  Vaping Use   Vaping Use: Never used  Substance and Sexual Activity   Alcohol use: No   Drug use: Never   Sexual activity: Not Currently  Other Topics Concern   Not on file  Social History Narrative   Right handed    Caffeine use: soda sometimes   Lives with family   Social Determinants of Health   Financial Resource Strain: Not on file  Food Insecurity:  Not on file  Transportation Needs: Not on file  Physical Activity: Not on file  Stress: Not on file  Social Connections: Not on file  Intimate Partner Violence: Not on file     PHYSICAL EXAM  Vitals:   12/03/21 1348  BP: 113/76  Pulse: 92  There is no height or weight on file to calculate BMI.   General: The patient is well-developed and well-nourished and in no acute distress  HEENT:  Head is [place]/AT.  Sclera are anicteric.     Skin: Extremities are without rash or  edema.  Musculoskeletal:  Back is nontender  Neurologic Exam  Mental status: The patient is alert and oriented x 3 at the time of the examination. The patient has apparent normal recent and remote memory, with an apparently normal attention span and concentration ability.   Speech is normal.  Cranial nerves: Extraocular movements are full. He has a right APD. Reduced color vision OD.  Facial strength and sensation is normal.  No obvious hearing deficits are noted.  Motor:  Muscle bulk is normal.   Tone is normal. Strength was 5/5 in the arms and left leg, 4+/5 in the iliopsoas and ankle/toe extensors and 5/5 elsewhere in the right leg.  Sensory: Sensory testing is intact to touch and vibration.  He has reduced vibration sensation in the legs.  Coordination: Cerebellar testing reveals reduced finger-nose-finger and reduced heel-to-shin bilaterally.  Gait and station: Station is normal.  His gait is ataxic but he can take a couple steps without support.  He does better with bilateral support.  He cannot tandem walk.  Romberg is positive.  Reflexes: Deep tendon reflexes are symmetric 3 in the arms and 3+ at the knees where he has crossed abductor reflexes.  He has sustained clonus at the ankles.    DIAGNOSTIC DATA (LABS, IMAGING, TESTING) - I reviewed patient records, labs, notes, testing and imaging myself where available.  Lab Results  Component Value Date   WBC 11.0 (H) 10/25/2021   HGB 16.3 10/25/2021    HCT 44.5 10/25/2021   MCV 89.0 10/25/2021   PLT 419 (H) 10/25/2021      Component Value Date/Time   NA 138 10/25/2021 1357   K 4.0 10/25/2021 1357   CL 108 10/25/2021 1357   CO2 27 10/25/2021 1357   GLUCOSE 94 10/25/2021 1357   BUN 20 10/25/2021 1357   CREATININE 0.89 10/25/2021 1357   CALCIUM 8.5 (L) 10/25/2021 1357   PROT 6.8 10/25/2021 1357   ALBUMIN 4.1 10/25/2021 1357   ALBUMIN 4.6 12/12/2019 0002   AST 15 10/25/2021 1357   ALT 17 10/25/2021 1357   ALKPHOS 47 10/25/2021 1357   BILITOT 1.0 10/25/2021 1357   GFRNONAA >60 10/25/2021 1357   GFRAA >60 06/02/2020 0516   No results found for: CHOL, HDL, LDLCALC, LDLDIRECT, TRIG, CHOLHDL Lab Results  Component Value Date   HGBA1C 5.3 12/14/2019   Lab Results  Component Value Date   VITAMINB12 678 12/11/2019   No results found for: TSH     ASSESSMENT AND PLAN  Multiple sclerosis (HCC) - Plan: Hepatitis B surface antigen, HIV Antibody (routine testing w rflx), IgG, IgA, IgM, Varicella zoster antibody, IgG, Hepatitis B core antibody, total, Hepatitis B surface antibody,qualitative, QuantiFERON-TB Gold Plus, Hepatitis C antibody, Stratify JCV Antibody Test ([facility])  High risk medication use - Plan: Hepatitis B surface antigen, HIV Antibody (routine testing w rflx), IgG, IgA, IgM, Varicella zoster antibody, IgG, Hepatitis B core antibody, total, Hepatitis B surface antibody,qualitative, QuantiFERON-TB Gold Plus, Hepatitis C antibody, Stratify JCV Antibody Test ([facility])  Autism spectrum disorder  POTS (postural orthostatic tachycardia syndrome)  Poor balance   1.  He has had an defects the patient and has multiple enhancing lesions on MRI.  Unfortunately, it appears as though he  has missed some of his Tysabri doses.  I cannot be certain if the exacerbation is due to the noncompliance or to standard breakthrough.  However, we need to get him on a different disease modifying therapy.  He would likely do better on Ocrevus due  to fewer infusions or on Kesimpta as the mother can do the injections.  We will check lab work for these.   2.  Continue baclofen, duloxetine..   3..  rtc 6 months sooner if problems  42-minute office visit with the majority of the time spent face-to-face for history and physical, discussion/counseling and decision-making.  Additional time with record and imaging review from recent hospitalization and documentation.    Riane Rung A. Epimenio Foot, MD, [facility] 12/03/2021, 5:41 PM Certified in Neurology, Clinical Neurophysiology, Sleep Medicine and Neuroimaging  [facility] [address], [address] [place], [place] [address] [phone]

## 2021-12-07 LAB — QUANTIFERON-TB GOLD PLUS
QuantiFERON Mitogen Value: >10 IU/mL
QuantiFERON Nil Value: 0.02 IU/mL
QuantiFERON TB1 Ag Value: 0.05 IU/mL
QuantiFERON TB2 Ag Value: 0.04 IU/mL
QuantiFERON-TB Gold Plus: NEGATIVE

## 2021-12-07 LAB — HEPATITIS B SURFACE ANTIGEN: Hepatitis B Surface Ag: NEGATIVE

## 2021-12-07 LAB — HIV ANTIBODY (ROUTINE TESTING W REFLEX): HIV Screen 4th Generation wRfx: NONREACTIVE

## 2021-12-07 LAB — HEPATITIS B SURFACE ANTIBODY,QUALITATIVE: Hep B Surface Ab, Qual: REACTIVE

## 2021-12-07 LAB — IGG, IGA, IGM
IgA/Immunoglobulin A, Serum: 194 mg/dL (ref 90–386)
IgG (Immunoglobin G), Serum: 1060 mg/dL (ref 603–1613)
IgM (Immunoglobulin M), Srm: 83 mg/dL (ref 20–172)

## 2021-12-07 LAB — HEPATITIS C ANTIBODY: Hep C Virus Ab: NONREACTIVE

## 2021-12-07 LAB — VARICELLA ZOSTER ANTIBODY, IGG: Varicella zoster IgG: <135 index — ABNORMAL LOW (ref >165)

## 2021-12-07 LAB — HEPATITIS B CORE ANTIBODY, TOTAL: Hep B Core Total Ab: NEGATIVE

## 2021-12-08 ENCOUNTER — Telehealth: Payer: Self-pay | Admitting: *Deleted

## 2021-12-08 NOTE — Telephone Encounter (Signed)
Received proof of varicella vaccination (see below). MD reviewed and approved sending in Kesimpta start form. I faxed completed/signed start form to [facility] at [phone]. Received fax confirmation. ? ? ?

## 2021-12-08 NOTE — Telephone Encounter (Signed)
Spoke w/ Dr. Epimenio Foot. He can get Tysabri infusion this Friday as scheduled and work on getting varicella in the meantime. I called and spoke w/ mother and her fiance. She now thinks son had varicella vaccine when he was younger. I placed on hold and spoke w/ MD. Per MD, if he has received vaccine, we will just need proof of vaccination sent to [place] and he would not need vaccination again. She will work on this and let us know. ?

## 2021-12-08 NOTE — Telephone Encounter (Signed)
Called and spoke with pt mother, Jaime [name]. Advised labs look ok except varicella shows level <135 (Immune >165 index). He will need varicella vaccine first before switching MS DMT. She confirmed he had chicken pox infection at young age but no vaccination. She will f/u w/ his PCP to get this scheduled. She will have them fax copy of vaccination once completed to our office.  ? ?She asked if he could still get Tysabri infusion this Friday given this new info. Advised I will speak with MD and call her back about this. ?

## 2021-12-10 NOTE — Telephone Encounter (Signed)
PA Kesimpta submitted on [employer]. Confirmation Number: [id number] W. Waiting on determination. ?

## 2021-12-11 ENCOUNTER — Other Ambulatory Visit: Payer: Self-pay

## 2021-12-11 ENCOUNTER — Non-Acute Institutional Stay (HOSPITAL_COMMUNITY)
Admission: RE | Admit: 2021-12-11 | Discharge: 2021-12-11 | Disposition: A | Discharge status: Home / Self Care | Payer: Medicaid Other | Source: Ambulatory Visit | Attending: Internal Medicine | Admitting: Internal Medicine

## 2021-12-11 DIAGNOSIS — G35 Multiple sclerosis: Secondary | ICD-10-CM | POA: Insufficient documentation

## 2021-12-11 MED ORDER — SODIUM CHLORIDE 0.9 % IV SOLN: INTRAVENOUS | Status: DC | PRN

## 2021-12-11 MED ORDER — SODIUM CHLORIDE 0.9 % IV SOLN
300.0000 mg | INTRAVENOUS | Status: DC
  Administered 2021-12-11: 300 mg via INTRAVENOUS
  Filled 2021-12-11: qty 15

## 2021-12-11 NOTE — Progress Notes (Addendum)
PATIENT CARE CENTER NOTE ?  ?Diagnosis: Multiple Sclerosis  ?  ?   ?Provider: Arlice Colt, MD ?  ?  ?Procedure: Tysabri 300mg  ?  ?  ?Note: Patient received Tysabri infusion ( dose #3 of 11)  via PIV. No premeds required per orders. Tolerated infusion well with no adverse reaction. Vital signs stable.  Patient stayed for 1 hour post-infusion observation, no s/s reaction noted. Discharge instructions given. Per pt's Mother , he will begin a new treatment that will be administered at home and will notify staff if Tysabri infusions will be discontinued at the patient care center once she speaks with his neurologist. Pt alert, oriented at discharge, taken in wheelchair accompanied by mother.  ?

## 2021-12-14 NOTE — Telephone Encounter (Signed)
PA for Kesimpta was approved by [facility] [employer]. Prior Approval [id number]. ?

## 2021-12-22 NOTE — Telephone Encounter (Signed)
I called patient to find out if he has received his Kesimpta yet. No answer, left a message asking him to call us back. ?

## 2021-12-25 ENCOUNTER — Encounter: Payer: Medicaid Other | Admitting: Physical Medicine and Rehabilitation

## 2021-12-29 NOTE — Telephone Encounter (Signed)
I called patient's mother, [name], per DPR. She reports that she has not had any calls from [employer] to discuss patient's kesimpta. I provided her with [employer]' number and asked her to follow up with them. I will also follow up with [employer]. ?

## 2021-12-29 NOTE — Telephone Encounter (Signed)
I called patient again to discuss. No answer, left a message asking him to call us back. 

## 2022-01-04 ENCOUNTER — Encounter: Payer: Self-pay | Admitting: Physical Medicine and Rehabilitation

## 2022-01-04 ENCOUNTER — Encounter: Payer: Medicaid Other | Admitting: Physical Medicine and Rehabilitation

## 2022-01-04 ENCOUNTER — Encounter
Payer: Medicaid Other | Attending: Physical Medicine and Rehabilitation | Admitting: Physical Medicine and Rehabilitation

## 2022-01-04 VITALS — BP 112/75 | HR 92 | Ht 69.0 in | Wt 120.0 lb

## 2022-01-04 DIAGNOSIS — G90A Postural orthostatic tachycardia syndrome (POTS): Secondary | ICD-10-CM | POA: Diagnosis present

## 2022-01-04 DIAGNOSIS — R252 Cramp and spasm: Secondary | ICD-10-CM | POA: Diagnosis present

## 2022-01-04 DIAGNOSIS — F840 Autistic disorder: Secondary | ICD-10-CM | POA: Insufficient documentation

## 2022-01-04 DIAGNOSIS — G35 Multiple sclerosis: Secondary | ICD-10-CM | POA: Diagnosis present

## 2022-01-04 DIAGNOSIS — R269 Unspecified abnormalities of gait and mobility: Secondary | ICD-10-CM | POA: Diagnosis present

## 2022-01-04 DIAGNOSIS — M6281 Muscle weakness (generalized): Secondary | ICD-10-CM | POA: Insufficient documentation

## 2022-01-04 MED ORDER — DULOXETINE HCL 60 MG PO CPEP: 60.0000 mg | ORAL_CAPSULE | Freq: Every day | ORAL | 5 refills | Status: DC

## 2022-01-04 NOTE — Progress Notes (Signed)
? ?Subjective:  ? ? Patient ID: Jaime [name], male    DOB: 1995/12/21, 26 y.o.   MRN: [id number] ? ?HPI ?Patient is a 26 yr old autistic male with fast relapsing-remitting MS- with 4 exacerbations now in <1 yr- with autonomic dysfunction.  ?With depression and constipation and orthostatic hypotension/ ?  ?Here for f/u on function related to MS.  ?Pt most recently admitted ot CIR after another relapse 1/23- so 5 relapses in ~ 12-18 months.  ? ? ?Mother just out of the hospital.  ? ?Moving right now to [place], [place]- 1 level house and has ramp!- bigger house.  ? ?Walking at home- now has to use RW. ? ?Spasticity- "all right"- has to shake off hands, due to spasms.  ?Knocks hand when has/makes "mistake".  ? ?Gets mad at body because it won't do what he wants.  ?Getting more frustrated- "every bit of action counts".  ? ? ?Mother has to fight to get him to take a shower- using depends - tries to avoid it so doesn't drink a lot.  ? ?Pt doesn't feel like will hurt self.  ? ?Had last infusion last month- ?Doing injection 1x/month- ?Kesimpta.  ? ?Is taking the Midodrine, and Baclofen  but is "forgetting to  ?Cymbalta".  ? ?Uses RW outside, but furniture walks inside the home.  ? ? ?Pain Inventory ?Average Pain 0 ?Pain Right Now 0 ?My pain is  No pain ? ?LOCATION OF PAIN  No pain ? ?BOWEL ?Number of stools per week: 6 ? ?BLADDER ?Normal and Pads ? ?Bladder incontinence Yes  ? ? ? ?Mobility ?walk with assistance ?use a walker ?ability to climb steps?  no ?do you drive?  no ?use a wheelchair ?transfers alone ?Do you have any goals in this area?  yes ? ?Function ?disabled: date disabled 2021 ?I need assistance with the following:  bathing, meal prep, household duties, and shopping ?Do you have any goals in this area?  yes ? ?Neuro/Psych ?bladder control problems ?numbness ?trouble walking ?depression ?anxiety ? ?Prior Studies ?Any changes since last visit?  no ? ?Physicians involved in your care ?Any changes since last visit?   no ? ? ?Family History  ?Problem Relation Age of Onset  ? Clotting disorder Mother   ? ?Social History  ? ?Socioeconomic History  ? Marital status: Single  ?  Spouse name: Not on file  ? Number of children: Not on file  ? Years of education: Not on file  ? Highest education level: Not on file  ?Occupational History  ? Not on file  ?Tobacco Use  ? Smoking status: Never  ? Smokeless tobacco: Never  ?Vaping Use  ? Vaping Use: Never used  ?Substance and Sexual Activity  ? Alcohol use: No  ? Drug use: Never  ? Sexual activity: Not Currently  ?Other Topics Concern  ? Not on file  ?Social History Narrative  ? Right handed   ? Caffeine use: soda sometimes  ? Lives with family  ? ?Social Determinants of Health  ? ?Financial Resource Strain: Not on file  ?Food Insecurity: Not on file  ?Transportation Needs: Not on file  ?Physical Activity: Not on file  ?Stress: Not on file  ?Social Connections: Not on file  ? ?Past Surgical History:  ?Procedure Laterality Date  ? NO PAST SURGERIES    ? ?Past Medical History:  ?Diagnosis Date  ? Autism   ? Depression   ? MS (multiple sclerosis) (HCC)   ? ?Ht 5\' 9"  (  1.753 m)   BMI 17.55 kg/m?  ? ?Opioid Risk Score:   ?Fall Risk Score:  `1 ? ?Depression screen PHQ 2/9 ? ? ?  08/31/2021  ?  1:14 PM 11/17/2020  ?  3:13 PM 01/09/2020  ?  9:14 AM  ?Depression screen PHQ 2/9  ?Decreased Interest 0 1 0  ?Down, Depressed, Hopeless 0 1 0  ?PHQ - 2 Score 0 2 0  ?Altered sleeping   0  ?Tired, decreased energy   0  ?Change in appetite   0  ?Feeling bad or failure about yourself    0  ?Trouble concentrating   0  ?Moving slowly or fidgety/restless   0  ?Suicidal thoughts   0  ?PHQ-9 Score   0  ? ? ?Review of Systems  ?Gastrointestinal:  Positive for constipation.  ?Genitourinary:   ?     Incontinence  ?Musculoskeletal:  Positive for gait problem.  ?Neurological:  Positive for numbness.  ?Psychiatric/Behavioral:    ?     Anxiety, Depression  ?All other systems reviewed and are negative. ? ?   ?Objective:  ?  Physical Exam ?Awake, alert, flat affect- more depressed than normal even for pt; so flat affect; voice even sounds depressed; accompanied by mother; in transport chair, NAD ? ?Neuro: ?Halting speech- very trace dysarthria/slurred speech ?Mas 1+ in legs B/L- hoffmans' B/L few beats clonus in LE's B/L  ?Wobbling when sitting upright-  ? ?MS: ?5/5 in arms and legs except L WE, grip and FA are 4+/5 ? ? ? ?   ?Assessment & Plan:  ? ?Patient is a 26 yr old autistic male with fast relapsing-remitting MS- with 5 exacerbations now in 2 year with autonomic dysfunction.  ?With depression and constipation and orthostatic hypotension- had 9 foci on newest MRI.  Initially started 3/21.  ?  ?Here for f/u on function related to MS.  ? ?Needs to restart taking Cymbalta 60 mg daily- for depression. Sent in refills 60 mg daily- #30 with 5 refills.  ? ?2. Con't Midodrine- 10 mg 3x/day for orthostatic hypotension. Hasn't been light headed except a little last 1 week  ? ? ? ?3. Con't Baclofen 10 mg TID- for spasticity- has 11 refills from 12/22 ? ? ?4. Discussed- after gets settled/moved, call me and will try to get PT into home- to help.  ? ? ?5. Has moved into 1 story home with ramp- which is great preparation for possible decline in level of function.  ? ? ?6.  Try to use phone to do voice recognition etc.  ? ? ?7. Discussed preparing for Rider to be at a lower level of function. As time goes on; might be an issue.  ? ? ?8. F/u 3 months- double appt.- MS  ? ?

## 2022-01-04 NOTE — Patient Instructions (Signed)
Patient is a 26 yr old autistic male with fast relapsing-remitting MS- with 5 exacerbations now in 2 year with autonomic dysfunction.  ?With depression and constipation and orthostatic hypotension- had 9 foci on newest MRI.  Initially started 3/21.  ?  ?Here for f/u on function related to MS.  ? ?Needs to restart taking Cymbalta 60 mg daily- for depression. Sent in refills 60 mg daily- #30 with 5 refills.  ? ?2. Con't Midodrine- 10 mg 3x/day for orthostatic hypotension. Hasn't been light headed except a little last 1 week  ? ? ? ?3. Con't Baclofen 10 mg TID- for spasticity- has 11 refills from 12/22 ? ? ?4. Discussed- after gets settled/moved, call me and will try to get PT into home- to help.  ? ? ?5. Has moved into 1 story home with ramp- which is great preparation for possible decline in level of function.  ? ? ?6.  Try to use phone to do voice recognition etc.  ? ? ?7. Discussed preparing for Rosaire to be at a lower level of function. As time goes on; might be an issue.  ? ? ?8. F/u 3 months- double appt.- MS  ?

## 2022-01-05 NOTE — Telephone Encounter (Signed)
Received notice from [employer] that multiple calls have been made to the patient with no return call. Patient has a $0 copay. Patient must return the Kesimpta hub's calls. ?

## 2022-01-05 NOTE — Telephone Encounter (Signed)
I called patient, spoke to patient's mother, [name]. She reports that she spoke with [employer] but they needed consent from the patient ot be able to speak with her. I asked her to please call the Kesimpta hub again at [phone] and the case manager, Lowella Bandy at [phone]. [name] assures me that she will take care of this today. ?

## 2022-01-06 ENCOUNTER — Ambulatory Visit: Payer: Medicaid Other | Admitting: Physical Medicine and Rehabilitation

## 2022-01-18 NOTE — Telephone Encounter (Signed)
Received notice from [employer] that patient must grant his mother permission to speak on his behalf. He needs to return the call to them at [phone]. ?

## 2022-01-18 NOTE — Telephone Encounter (Signed)
I called patient's mother, Arrie Aran, per DPR to discuss. No answer, VM not set up. ? ?If patient's mother calls back please advise her of the message from [employer] and encourage her to ask patient to call them back ASAP. ?

## 2022-01-19 ENCOUNTER — Encounter: Payer: Self-pay | Admitting: Neurology

## 2022-01-19 NOTE — Telephone Encounter (Signed)
Error

## 2022-01-19 NOTE — Telephone Encounter (Signed)
Called mother back. She did not wait for two clicks w/ autoinjector. She took pen out before the second click. Does not feel son got a lot of the medication, if any. I instructed her to call [facility] for support at [phone]. They may be able to send replacement pen.  ?If not, she can contact [facility] who they received pen from. She verbalized understanding.  ?

## 2022-01-19 NOTE — Telephone Encounter (Signed)
Pt's Jaime [name] said, administering the Kesimpta shot, remove the pin and the medication sprayed out. Do need to do another dose or wait till next week? Would like a call from the nurse. ? ?

## 2022-01-20 ENCOUNTER — Other Ambulatory Visit: Payer: Self-pay | Admitting: Neurology

## 2022-01-26 ENCOUNTER — Telehealth: Payer: Self-pay | Admitting: Neurology

## 2022-01-26 NOTE — Telephone Encounter (Signed)
The rep received our message and stated she is looking into this. ?"Thanks for letting me know. I will follow up. " ? ?

## 2022-01-26 NOTE — Telephone Encounter (Signed)
Called the mother back. The patient had his dose today and did fine with it. She is calling in reference to the dose last week he didn't fully get. She has contacted pharmacy and alongside and they are unable to offer replacement pens. They recommended she contact us to discuss sample. I advised we do not give samples. We are reaching out to our rep for the kesimpta to find out what happens in these circumstances. Informed we will contact with her recommendations after hearing back.  ?

## 2022-01-26 NOTE — Telephone Encounter (Signed)
Pt's mother states re: one of the injections (KESIMPTA 20 MG/0.4ML SOAJ) they tried giving it to pt in his leg and 1/2 of it went in and the other 1/2 came out the needle.  Pt's mother was told by the pharmacy that there was nothing they could do other than to make the suggestion that she calls here and ask for a sample, please call  ?

## 2022-04-26 ENCOUNTER — Encounter
Payer: Medicaid Other | Attending: Physical Medicine and Rehabilitation | Admitting: Physical Medicine and Rehabilitation

## 2022-04-26 DIAGNOSIS — R252 Cramp and spasm: Secondary | ICD-10-CM | POA: Insufficient documentation

## 2022-04-26 DIAGNOSIS — G90A Postural orthostatic tachycardia syndrome (POTS): Secondary | ICD-10-CM | POA: Insufficient documentation

## 2022-04-26 DIAGNOSIS — M6281 Muscle weakness (generalized): Secondary | ICD-10-CM | POA: Insufficient documentation

## 2022-04-26 DIAGNOSIS — G35 Multiple sclerosis: Secondary | ICD-10-CM | POA: Insufficient documentation

## 2022-04-26 DIAGNOSIS — R269 Unspecified abnormalities of gait and mobility: Secondary | ICD-10-CM | POA: Insufficient documentation

## 2022-04-26 DIAGNOSIS — F840 Autistic disorder: Secondary | ICD-10-CM | POA: Insufficient documentation

## 2022-06-10 ENCOUNTER — Ambulatory Visit: Payer: Medicaid Other | Admitting: Neurology

## 2023-01-10 ENCOUNTER — Other Ambulatory Visit: Payer: Self-pay | Admitting: Neurology

## 2023-01-18 ENCOUNTER — Other Ambulatory Visit: Payer: Self-pay

## 2023-01-18 ENCOUNTER — Telehealth: Payer: Self-pay | Admitting: Neurology

## 2023-01-18 MED ORDER — KESIMPTA 20 MG/0.4ML ~~LOC~~ SOAJ: 0.4000 mL | SUBCUTANEOUS | 0 refills | Status: DC

## 2023-01-18 NOTE — Telephone Encounter (Signed)
LVM  telling pt's mother that pt has to keep his upcoming appointment on 03/14/2023.   Pt Last seen 12/03/2021 Upcoming Appointment 03/14/2023  Kesimpta Last Ordered 01/20/2022 Escript sent in 01/18/2023

## 2023-01-18 NOTE — Telephone Encounter (Signed)
Pt's mother called stating that the pt is needing a refill on his KESIMPTA 20 MG/0.4ML SOAJ  Pt has been scheduled to f/u with Provider.

## 2023-03-14 ENCOUNTER — Ambulatory Visit: Payer: Medicaid Other | Admitting: Neurology

## 2023-04-25 ENCOUNTER — Ambulatory Visit: Payer: Medicaid Other | Admitting: Neurology

## 2023-04-25 ENCOUNTER — Encounter: Payer: Self-pay | Admitting: Neurology

## 2023-04-25 ENCOUNTER — Telehealth: Payer: Self-pay | Admitting: Neurology

## 2023-04-25 ENCOUNTER — Other Ambulatory Visit: Payer: Self-pay | Admitting: Neurology

## 2023-04-25 NOTE — Telephone Encounter (Signed)
Unable to leave msg on work number

## 2023-04-25 NOTE — Telephone Encounter (Signed)
Pt and mother came to appt 30 minutes late due to traffic 04/25/2023. They have just moved to [place] and were wondering if they could get a referral neurologist in [place] closer to where they live. Mother is also having concerns bout pt bladder. Would like a call from nurse.

## 2023-04-25 NOTE — Telephone Encounter (Signed)
1st attempt lvm routing to pod 1 calls to follow up on

## 2023-04-25 NOTE — Telephone Encounter (Signed)
Routing to Jaime [name] to see if she would be willing to refer to [facility]

## 2023-04-25 NOTE — Telephone Encounter (Signed)
Home number listed for the pt is not in service

## 2023-04-25 NOTE — Telephone Encounter (Signed)
Accidentally routed to the wrong provider. This is being routed to Dr. Epimenio Foot

## 2023-04-25 NOTE — Telephone Encounter (Signed)
Pt last seen on 12/03/21 Follow up scheduled on 04/25/23 (pt no showed)

## 2023-04-26 NOTE — Telephone Encounter (Signed)
Lvm 2nd attempt on mother dawn phone

## 2023-04-27 MED ORDER — OXYBUTYNIN CHLORIDE 5 MG PO TABS: 5.0000 mg | ORAL_TABLET | Freq: Two times a day (BID) | ORAL | 5 refills | Status: DC

## 2023-04-27 NOTE — Telephone Encounter (Signed)
Called and relayed the following information:  He can be referred to Daralene Milch, [place] ([facility] in [place])  For the bladder, we can send in oxybutynin 5 mg #60 #5 refills   one po bid    [facility]  [address], [address]  [place], [place] [address]  [phone]  The pts mother was agreeable to all discussed & med was sent in that provider requested. Referral pended as when I picked [facility] it only allowed referral to the [place] location

## 2023-04-27 NOTE — Addendum Note (Signed)
Addended by: Danne Harbor on: 04/27/2023 08:32 AM   Modules accepted: Orders

## 2023-05-31 ENCOUNTER — Other Ambulatory Visit: Payer: Self-pay | Admitting: Neurology

## 2023-05-31 ENCOUNTER — Telehealth: Payer: Self-pay | Admitting: Neurology

## 2023-05-31 MED ORDER — KESIMPTA 20 MG/0.4ML ~~LOC~~ SOAJ: 0.4000 mL | SUBCUTANEOUS | 0 refills | Status: DC

## 2023-05-31 NOTE — Telephone Encounter (Signed)
Pt is requesting a refill for KESIMPTA 20 MG/0.4ML SOAJ.  Pharmacy: [facility]

## 2023-06-02 ENCOUNTER — Telehealth: Payer: Self-pay | Admitting: Neurology

## 2023-06-02 MED ORDER — KESIMPTA 20 MG/0.4ML ~~LOC~~ SOAJ: 0.4000 mL | SUBCUTANEOUS | 0 refills | Status: DC

## 2023-06-02 NOTE — Telephone Encounter (Signed)
I called patient mother to let her know a Rx was sent [facility] on 05/31/23 for Rx. However her voicemail was full not able to leave a message to relay.

## 2023-06-02 NOTE — Telephone Encounter (Signed)
Pt's mother called stating that the pt is needing a his KESIMPTA 20 MG/0.4ML SOAJ  as soon as possible. Please call mother when this is called in.

## 2023-06-02 NOTE — Addendum Note (Signed)
Addended by: Aura Camps on: 06/02/2023 03:59 PM   Modules accepted: Orders

## 2023-06-02 NOTE — Telephone Encounter (Signed)
Pt's mother has called back, she states they no longer live in [place], she is asking [phone]

## 2023-06-02 NOTE — Telephone Encounter (Signed)
Rx sent to pharmacy requested below. 

## 2023-06-07 ENCOUNTER — Ambulatory Visit: Payer: MEDICAID | Admitting: Family Medicine

## 2023-06-07 NOTE — Progress Notes (Signed)
Chief Complaint  Patient presents with   Follow-up    Pt in room 2, mother in room. Here for MS follow up. Pt mother said pt has been urine/ bowel incontinence last week. Pt on Kesimpta. Needs new Rx for oxbutynin     HISTORY OF PRESENT ILLNESS:  06/08/23 ALL:  Jaime Jaime [name] is a 27 y.o. male here today for follow up for Jaime [name]. He was last seen by Dr Epimenio Foot 12/2021. Missed appt 06/2022. Had to reschedule due to being late 04/2023. He was switched from Tysabri to [facility] 12/2021. Mom denies missed injections. Last MRI cervical and brain 10/2021 showed multiple new and old lesions.   Mom reports symptoms are stable. No new or exacerbating symptoms. Gait is stable. He continues to have difficulty with balance. Right sided weakness. He uses Rolator at all times. He denies falls. He continues to see Dr Berline Chough with [facility]. Last visit 01/2023. He continues baclofen 5mg  TID PRN for spasticity. Also continuing midodrine for POTS per Dr Berline Chough.   Mood is good. He continues duloxetine 60mg  daily per Dr Berline Chough. He reports sleeping well. Usually gets at least 8 hours of sleep each night.   He continues to have intermittent bowel and bladder incontinence. He wears depends. He will hold bowel and bladder until last minute then has an accident. Mom reports he has not taken oxybutynin recently. Rx was called in 04/2023 but not filled. She reports last bowel incontinence episode was about 3 months ago. He has had bladder incontinence this week.   HISTORY (copied from Dr Bonnita Hollow previous note)  Jaime Jaime [name].      Update  12/03/2021: He is on Tysabri.    He is JCV Ab negative (0.16 n 01/22/2021).  According to his mother.  He had a dose in October, December and January but missed November.  However, it looks like he may have missed some additional doses, looking at the chart.   He went to the ED with a possible exacerbation 10/18/2021.  He was having more trouble walking and  was slurring his words.   He had trouble lifting the left leg.     He received 5 days IV Solumedrol and did PT.    He is much better and is able to walk up and down stairs now using the rail.      MRI of the brain and cervical spine 10/19/2021 showed at least 9 supratentorial foci in the brain.    Cord showed old foci - none enhancing but some not present in 2022 MRI.     Around the house he uses furniture for balance or a walker.   He has no recent falls.   He feels hands are strong but he has some clumsiness and tremor. He has some spasticity helped by baclofen.     He has ankle clonus when leg flexed backwards   He has some urinary dribble incontinence but no frank incontinence.   He has constipation.     He sleeps well most nights now.    He notes mild depression and anxiety.   He feels cognition is doing about the same as before the MS but he has some word finding slowness at times - better than earlir.    His great great uncle had MS.      He has medical issues include POTS, autism spectrum disorder and depression.   Sertraline was prescribed for mood and depression  but he prefers not to take it.     He has not had Hepatitis, TB.  He did have Scarlet Fever.   He had Chicken pox at age 33 or 40.      MS history: He was diagnosed with MS March 2021 after presenting with poor balance, disorientation.  At the time, he was walking his dog and collapsed prompting a visit to the ED.   MRI's of the head and spine showed multiple lesions c/w MS.   In retrospect, he had some imbalance in February but was walking at his baseline January 2021.   Multiple brain and T-spine lesions enhanced.   He ws admitted and received 5 days of IV solu-medrol.   He improved but not to baseline.  He did outpatient rehab.  He never saw neurology as outpatient.    In August 2021, he began to have more difficulty with his gait and re-presented to the ED 05/19/2020.    It showed numerous enhancing lesions and he was admitted for 5  more days of IV Solu-medrol.    He improved some but is not at baseline.   Imaging review: MRI of the brain 10/19/2021 shows multiple T2/FLAIR hyperintense foci in the periventricular, juxtacortical and deep white matter of the hemispheres, medulla, pons, middle cerebellar peduncles, cerebellum, midbrain, thalamus about 8 or 9 foci in the hemispheres enhance after contrast consistent with acute lesions.  None of the brainstem lesions appear to enhance.   MRI of the cervical spine 10/19/2021 shows multiple T2 hyperintense foci in the spinal cord.  A large 1 is located centrally and posteriorly from C1-C2 to C3.  Focus to the right adjacent to C2-C3, posteriorly at C3-C4, focus to the right adjacent to C4, anterolaterally to the right adjacent to C5, posteriorly adjacent to C5-C6, centrally adjacent to C7.  Foci also in the thoracic spine.  None of these enhance.  Most of these appear to be present on the MRI from 2021.   MRI brain 07/15/2020 shows multiple T2/FLAIR hyperintense foci in the pons, middle cerebral hemispheres, cerebellum, spinal cord, medulla, thalamus and in the periventricular, juxtacortical and deep white matter of the hemispheres.  At least a dozen foci in the hemispheres enhance after contrast consistent with acute demyelination.  These and some other foci are new compared to the 05/19/2020 MRI.   MRI of the cervical spine 07/15/2020 shows multiple T2 hyperintense foci in the spinal cord.  None of the foci enhance after contrast.  Some foci are new compared to the 12/11/2019 MRI.  MRI of the thoracic spine 07/15/2020 showed multiple T2 hyperintense foci in the thoracic spinal cord.  1 focus at T4 appears to be enhancing.  There are several new lesions compared to the 12/16/2019 MRI.  MRI of the brain 05/19/2020 showed multiple T2/FLAIR hyperintense foci in the periventricular, juxtacortical and deep white matter.  There are also foci in the cerebellum, left middle cerebellar peduncle and  brainstem.  Multiple lesions enhance including some in the hemispheres, cerebellum and middle cerebellar peduncle.  Foci that enhanced on the March 2021 MRI appear smaller and do not enhance   MRI of the brain  12/16/2019 shows Multiple T2/FLAIR hyperintense foci in the periventricular, juxtacortical and deep white matter of the hemispheres.  Additionally, foci are noted in the brainstem and cerebellum enhancement.   MRI of the brain 12/11/2019 shows multiple T2/FLAIR hyperintense foci in the periventricular, juxtacortical and deep white matter of the hemispheres.  Many of the periventricular foci  are radially oriented to the ventricles.  Foci are noted in the deep gray matter, brainstem and cerebellar peduncles multiple lesions enhance.   MRI of the cervical spine 12/11/2019 shows extensive T2 hyperintense foci in the spinal cord the most prominent focus is posteriorly to the right at C2.  None of the foci enhanced.   MRI of the thoracic spine 12/11/2019 showed multiple T2 hyperintense foci within the spinal cord.  There was enhancement at T1-T2 and T10-T11.   MRI of the lumbar spine 12/11/2019 showed some demyelination in the distal spinal cord but no enhancement.  There is a small disc protrusion at L5-S1 that does not cause nerve root compression or spinal stenosis.   Laboratory tests: 06/19/2020: Hep B surface antibody positive.  Hep B surface antigen and hep C core antibody are negative.   QuantiFERON-TB is negative VZV IgG shows that he is not immune to varicella.   Labs done March 2021 admission: RPR negative, HIV negative, ACE normal.  NMO antibody was negative.  SARS-CoV-2 PCR was negative.   Cerebrospinal fluid December 12, 2019: IgG index 1.1 (this is very elevated consistent with MS, oligoclonal bands was not performed).  CSF was otherwise normal.   REVIEW OF SYSTEMS: Out of a complete 14 system review of symptoms, the patient complains only of the following symptoms, right lower ext weakness,  imbalance, depression, and all other reviewed systems are negative.   ALLERGIES: No Known Allergies   HOME MEDICATIONS: Outpatient Medications Prior to Visit  Medication Sig Dispense Refill   acetaminophen (TYLENOL) 500 MG tablet Take 1,000 mg by mouth every 6 (six) hours as needed for headache (pain).     Baclofen 5 MG TABS Take 5 mg by mouth 3 (three) times daily. 90 tablet 11   docusate sodium (COLACE) 100 MG capsule Take 1 capsule (100 mg total) by mouth 3 (three) times daily. (Patient taking differently: Take 100 mg by mouth 3 (three) times daily as needed (constipation).) 10 capsule 0   DULoxetine (CYMBALTA) 60 MG capsule Take 1 capsule (60 mg total) by mouth daily. 90 capsule 5   KESIMPTA 20 MG/0.4ML SOAJ Inject 0.4 mLs into the skin every 30 (thirty) days. 1.2 mL 0   midodrine (PROAMATINE) 10 MG tablet Take 1 tablet (10 mg total) by mouth 2 (two) times daily with a meal. 60 tablet 11   ondansetron (ZOFRAN) 4 MG tablet Take 1 tablet (4 mg total) by mouth every 8 (eight) hours as needed for nausea or vomiting. (Patient taking differently: Take 4 mg by mouth daily as needed (with each dose of Cymbalta (duloxetine)).) 60 tablet 0   oxybutynin (DITROPAN) 5 MG tablet Take 1 tablet (5 mg total) by mouth 2 (two) times daily. 60 tablet 5   No facility-administered medications prior to visit.     PAST MEDICAL HISTORY: Past Medical History:  Diagnosis Date   Autism    Depression    MS (multiple sclerosis) (HCC)      PAST SURGICAL HISTORY: Past Surgical History:  Procedure Laterality Date   NO PAST SURGERIES       FAMILY HISTORY: Family History  Problem Relation Age of Onset   Clotting disorder Mother      SOCIAL HISTORY: Social History   Socioeconomic History   Marital status: Single    Spouse name: Not on file   Number of children: Not on file   Years of education: Not on file   Highest education level: Not on file  Occupational History  Not on file  Tobacco  Use   Smoking status: Never   Smokeless tobacco: Never  Vaping Use   Vaping status: Never Used  Substance and Sexual Activity   Alcohol use: No   Drug use: Never   Sexual activity: Not Currently  Other Topics Concern   Not on file  Social History Narrative   Right handed    Caffeine use: soda sometimes   Lives with family   Social Determinants of Health   Financial Resource Strain: Not on file  Food Insecurity: Not on file  Transportation Needs: Not on file  Physical Activity: Not on file  Stress: Not on file  Social Connections: Not on file  Intimate Partner Violence: Not on file     PHYSICAL EXAM  Vitals:   06/08/23 1127  BP: (!) 96/57  Pulse: 84  Weight: 122 lb (55.3 kg)  Height: 5\' 10"  (1.778 m)   Body mass index is 17.51 kg/m.  Generalized: Well developed, in no acute distress  Cardiology: normal rate and rhythm, no murmur auscultated  Respiratory: clear to auscultation bilaterally    Neurological examination  Mentation: Alert oriented to time, place, history taking. Follows all commands speech and language fluent Cranial nerve II-XII: Pupils were equal round reactive to light. Extraocular movements were full, visual field were full on confrontational test. Facial sensation and strength were normal. Uvula tongue midline. Head turning and shoulder shrug  were normal and symmetric. Motor: The motor testing reveals 5 over 5 strength of all 4 extremities with exception of 4+/5 right hip flexion. Good symmetric motor tone is noted throughout.  Sensory: Sensory testing is intact to soft touch on all 4 extremities. No evidence of extinction is noted.  Gait and station: Able to push to standing position independently. Gait is mildly spastic, stable with Rolator. Tandem gait not attempted Reflexes: Deep tendon reflexes are symmetric and normal bilaterally.    DIAGNOSTIC DATA (LABS, IMAGING, TESTING) - I reviewed patient records, labs, notes, testing and imaging  myself where available.  Lab Results  Component Value Date   WBC 11.0 (H) 10/25/2021   HGB 16.3 10/25/2021   HCT 44.5 10/25/2021   MCV 89.0 10/25/2021   PLT 419 (H) 10/25/2021      Component Value Date/Time   NA 138 10/25/2021 1357   K 4.0 10/25/2021 1357   CL 108 10/25/2021 1357   CO2 27 10/25/2021 1357   GLUCOSE 94 10/25/2021 1357   BUN 20 10/25/2021 1357   CREATININE 0.89 10/25/2021 1357   CALCIUM 8.5 (L) 10/25/2021 1357   PROT 6.8 10/25/2021 1357   ALBUMIN 4.1 10/25/2021 1357   ALBUMIN 4.6 12/12/2019 0002   AST 15 10/25/2021 1357   ALT 17 10/25/2021 1357   ALKPHOS 47 10/25/2021 1357   BILITOT 1.0 10/25/2021 1357   GFRNONAA >60 10/25/2021 1357   GFRAA >60 06/02/2020 0516   No results found for: "CHOL", "HDL", "LDLCALC", "LDLDIRECT", "TRIG", "CHOLHDL" Lab Results  Component Value Date   HGBA1C 5.3 12/14/2019   Lab Results  Component Value Date   VITAMINB12 678 12/11/2019   No results found for: "TSH"      No data to display               No data to display           ASSESSMENT AND PLAN  27 y.o. year old male  has a past medical history of Autism, Depression, and MS (multiple sclerosis) (HCC). here with  Multiple sclerosis (HCC) - Plan: IgG, IgA, IgM, CBC with Differential/Platelets, Ambulatory referral to Neurology, MR BRAIN W WO CONTRAST  High risk medication use  Autism spectrum disorder  POTS (postural orthostatic tachycardia syndrome)  Poor balance  Jaime Jaime [name] is doing fairly well. He is tolerating Kesimpta injections. Will continue monthly. I will update labs, today. MRI orders placed for monitoring. I have sent a new rx for oxybutynin 5mg  BID. I have also placed a referral to neurology closer to his home in [place]. Mom was advised to listen for a call to schedule both the MRI and appt with new neurologist. Healthy lifestyle habits encouraged. He will follow up with PCP as directed. He will return to see Dr Epimenio Foot in 4-6 months  if not already established with new neurologist. He and his mother verbalize understanding and agreement with this plan.   Orders Placed This Encounter  Procedures   MR BRAIN W WO CONTRAST    Standing Status:   Future    Standing Expiration Date:   06/07/2024    Order Specific Question:   If indicated for the ordered procedure, I authorize the administration of contrast media per Radiology protocol    Answer:   Yes    Order Specific Question:   What is the patient's sedation requirement?    Answer:   No Sedation    Order Specific Question:   Does the patient have a pacemaker or implanted devices?    Answer:   No    Order Specific Question:   Radiology Contrast Protocol - do NOT remove file path    Answer:   [email or web address]    Order Specific Question:   Preferred imaging location?    Answer:   External   IgG, IgA, IgM   CBC with Differential/Platelets   Ambulatory referral to Neurology    Referral Priority:   Routine    Referral Type:   Consultation    Referral Reason:   Specialty Services Required    Requested Specialty:   Neurology    Number of Visits Requested:   1     Meds ordered this encounter  Medications   oxybutynin (DITROPAN) 5 MG tablet    Sig: Take 1 tablet (5 mg total) by mouth 2 (two) times daily.    Dispense:  60 tablet    Refill:  5    Order Specific Question:   Supervising Provider    Answer:   Anson Fret [[id number]]     Shawnie Dapper, MSN, FNP-C 06/08/2023, 1:22 PM  [facility] [address] [place], [place] [address] [phone]

## 2023-06-08 ENCOUNTER — Ambulatory Visit (INDEPENDENT_AMBULATORY_CARE_PROVIDER_SITE_OTHER): Payer: MEDICAID | Admitting: Family Medicine

## 2023-06-08 ENCOUNTER — Encounter: Payer: Self-pay | Admitting: Family Medicine

## 2023-06-08 VITALS — BP 96/57 | HR 84 | Ht 70.0 in | Wt 122.0 lb

## 2023-06-08 DIAGNOSIS — Z79899 Other long term (current) drug therapy: Secondary | ICD-10-CM | POA: Diagnosis not present

## 2023-06-08 DIAGNOSIS — G35 Multiple sclerosis: Secondary | ICD-10-CM

## 2023-06-08 DIAGNOSIS — F840 Autistic disorder: Secondary | ICD-10-CM

## 2023-06-08 DIAGNOSIS — G90A Postural orthostatic tachycardia syndrome (POTS): Secondary | ICD-10-CM

## 2023-06-08 DIAGNOSIS — R2689 Other abnormalities of gait and mobility: Secondary | ICD-10-CM

## 2023-06-08 MED ORDER — OXYBUTYNIN CHLORIDE 5 MG PO TABS: 5.0000 mg | ORAL_TABLET | Freq: Two times a day (BID) | ORAL | 5 refills | Status: DC

## 2023-06-08 NOTE — Patient Instructions (Signed)
Below is our plan:  We will update labs. I have ordered an updated MRI. They will call to schedule. We resent refill for ox[facility]butynin to [facility] in [place]. I placed a referral to neurology closer to your area. Our staff will help you find someone and they will call to schedule. Please let me know if you do not hear back.   Please make sure you are staying well hydrated. I recommend 50-60 ounces daily. Well balanced diet and regular exercise encouraged. Consistent sleep schedule with 6-8 hours recommended.   Please continue follow up with care team as directed.   Follow up with Dr Epimenio Foot in 4 months. May cancel if already established with new neurologist.   You may receive a survey regarding today's visit. I encourage you to leave honest feed back as I do use this information to improve patient care. Thank you for seeing me today!

## 2023-06-09 ENCOUNTER — Telehealth: Payer: Self-pay | Admitting: Family Medicine

## 2023-06-09 ENCOUNTER — Other Ambulatory Visit: Payer: Self-pay | Admitting: *Deleted

## 2023-06-09 LAB — CBC WITH DIFFERENTIAL/PLATELET
Basophils Absolute: 0.1 10*3/uL (ref 0.0–0.2)
Basos: 1 %
EOS (ABSOLUTE): 0.2 10*3/uL (ref 0.0–0.4)
Eos: 2 %
Hematocrit: 46.8 % (ref 37.5–51.0)
Hemoglobin: 15.5 g/dL (ref 13.0–17.7)
Immature Grans (Abs): 0 10*3/uL (ref 0.0–0.1)
Immature Granulocytes: 0 %
Lymphocytes Absolute: 1.1 10*3/uL (ref 0.7–3.1)
Lymphs: 14 %
MCH: 31.5 pg (ref 26.6–33.0)
MCHC: 33.1 g/dL (ref 31.5–35.7)
MCV: 95 fL (ref 79–97)
Monocytes Absolute: 0.5 10*3/uL (ref 0.1–0.9)
Monocytes: 6 %
Neutrophils Absolute: 6.1 10*3/uL (ref 1.4–7.0)
Neutrophils: 77 %
Platelets: 239 10*3/uL (ref 150–450)
RBC: 4.92 x10E6/uL (ref 4.14–5.80)
RDW: 13.2 % (ref 11.6–15.4)
WBC: 7.9 10*3/uL (ref 3.4–10.8)

## 2023-06-09 LAB — IGG, IGA, IGM
IgA/Immunoglobulin A, Serum: 243 mg/dL (ref 90–386)
IgG (Immunoglobin G), Serum: 1240 mg/dL (ref 603–1613)
IgM (Immunoglobulin M), Srm: 58 mg/dL (ref 20–172)

## 2023-06-09 MED ORDER — KESIMPTA 20 MG/0.4ML ~~LOC~~ SOAJ: 0.4000 mL | SUBCUTANEOUS | 6 refills | Status: DC

## 2023-06-09 NOTE — Telephone Encounter (Signed)
medicaid NPR sent to [facility] [phone] 

## 2023-06-13 ENCOUNTER — Telehealth: Payer: Self-pay | Admitting: Family Medicine

## 2023-06-13 NOTE — Telephone Encounter (Signed)
Referral for neurology fax to [facility]. Phone: [phone], Fax: [phone].

## 2023-06-15 NOTE — Telephone Encounter (Signed)
Contacted [facility] to make sure patient will be schedule with Dr. Roxanna Mew. Receptionist there is already a request for referral to be seen by Dr. Roxanna Mew, but has not been scheduled yet.

## 2023-06-28 ENCOUNTER — Telehealth: Payer: Self-pay | Admitting: Family Medicine

## 2023-06-28 NOTE — Telephone Encounter (Signed)
Pt's mother, Azion Macera said have not received refill KESIMPTA 20 MG/0.4ML SOAJ. Would like a call back

## 2023-06-29 ENCOUNTER — Other Ambulatory Visit: Payer: Self-pay

## 2023-06-29 NOTE — Telephone Encounter (Signed)
Pt's mother called back and was informed of this information. Pt was really appreciative but stated that she will call [facility] to see if it can be shipped sooner due to the pt needing to get it on the 1st of Oct.

## 2023-06-29 NOTE — Telephone Encounter (Signed)
Noted  

## 2023-06-29 NOTE — Telephone Encounter (Addendum)
Appears last rx [facility]. Called pharmacy at [phone]. Spoke w/  Fleeta Emmer. Last shipped out 06/08/23 qty 0.70ml (does not cover 1.44ml per 90 days). Next one due to ship 07/05/23. Per automated system, they reached to pt to schedule and LVM.  LVM for mother to call office back.  Phone room: If mother calls back, please relay above message and provide her [facility]# [facility] to call back and schedule shipment

## 2023-08-06 ENCOUNTER — Other Ambulatory Visit: Payer: Self-pay

## 2023-11-09 ENCOUNTER — Ambulatory Visit (INDEPENDENT_AMBULATORY_CARE_PROVIDER_SITE_OTHER): Payer: MEDICAID | Admitting: Neurology

## 2023-11-09 ENCOUNTER — Encounter: Payer: Self-pay | Admitting: Neurology

## 2023-11-09 VITALS — BP 122/77 | HR 90 | Ht 69.0 in | Wt 123.0 lb

## 2023-11-09 DIAGNOSIS — Z79899 Other long term (current) drug therapy: Secondary | ICD-10-CM | POA: Diagnosis not present

## 2023-11-09 DIAGNOSIS — G35 Multiple sclerosis: Secondary | ICD-10-CM | POA: Diagnosis not present

## 2023-11-09 DIAGNOSIS — R2689 Other abnormalities of gait and mobility: Secondary | ICD-10-CM

## 2023-11-09 DIAGNOSIS — G90A Postural orthostatic tachycardia syndrome (POTS): Secondary | ICD-10-CM | POA: Diagnosis not present

## 2023-11-09 DIAGNOSIS — F840 Autistic disorder: Secondary | ICD-10-CM

## 2023-11-09 MED ORDER — TAMSULOSIN HCL 0.4 MG PO CAPS: 0.4000 mg | ORAL_CAPSULE | Freq: Every day | ORAL | 11 refills | Status: DC

## 2023-11-09 NOTE — Progress Notes (Signed)
 [facility]  PATIENT: Jaime [name] DOB: Jan 19, 1996  REFERRING DOCTOR OR PCP: Luke Manns, MD SOURCE: Patient, notes from hospital, imaging and lab reports, MRI images personally reviewed.  _________________________________   HISTORICAL  CHIEF COMPLAINT:  Chief Complaint  Patient presents with   Room 10    Pt is here with his Mother.  Pt states he is doing good. Pt states that he is having Restless leg. Pt's mother states that pt is stable and tolerating well with his Kesimpta .     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     Update  11/09/2023: He is on Kesimpta  and is tolerating it well  .He had either breakthrough or a rebound while on Tysabri  (missed some doses) anfd Kesimpta  is much easier as no travel required.  His mother is able to do the injections for him.  He is doing better and walking without his walker in the house and uses it outdoors.   SABRA   He feels hands are strong but he has some clumsiness and tremor. He has some spasticity helped by baclofen .     He has ankle clonus when leg flexed backwards  He drinks a lot of water and then urinates several times at night.  He wears Depends at night.due to occasional incontinence,      He has hesitancy and reduced stream and often has double voids.    He sleeps well most nights now.    He has autism.  He notes mild depression and anxiety.   He feels cognition is doing about the same as before the MS but he has some word finding slowness at times - better than earlir.   His great great uncle had MS.     He has medical issues include POTS, autism spectrum disorder and depression.   Sertraline  was prescribed for mood and depression but he prefers not to take it.   .     MS history: He was diagnosed with MS March 2021 after presenting with poor balance, disorientation.  At the time, he was walking his dog and collapsed prompting a visit to the ED.   MRI's of the head and  spine showed multiple lesions c/w MS.   In retrospect, he had some imbalance in February but was walking at his baseline January 2021.   Multiple brain and T-spine lesions enhanced.   He ws admitted and received 5 days of IV solu-medrol .   He improved but not to baseline.  He did outpatient rehab.  He never saw neurology as outpatient.    In August 2021, he began to have more difficulty with his gait and re-presented to the ED 05/19/2020.    It showed numerous enhancing lesions and he was admitted for 5 more days of IV Solu-medrol .    He improved some but is not at baseline.  Imaging review: MRI of the brain 10/19/2021 shows multiple T2/FLAIR hyperintense foci in the periventricular, juxtacortical and deep white matter of the hemispheres, medulla, pons, middle cerebellar peduncles, cerebellum, midbrain, thalamus about 8 or 9 foci in the hemispheres enhance after contrast consistent with acute lesions.  None of the brainstem lesions appear to enhance.  MRI of the cervical spine 10/19/2021 shows multiple T2 hyperintense foci in the spinal cord.  A large 1 is located centrally and posteriorly from C1-C2 to C3.  Focus to the right adjacent to C2-C3, posteriorly at C3-C4, focus to the right adjacent to  C4, anterolaterally to the right adjacent to C5, posteriorly adjacent to C5-C6, centrally adjacent to C7.  Foci also in the thoracic spine.  None of these enhance.  Most of these appear to be present on the MRI from 2021.  MRI brain 07/15/2020 shows multiple T2/FLAIR hyperintense foci in the pons, middle cerebral hemispheres, cerebellum, spinal cord, medulla, thalamus and in the periventricular, juxtacortical and deep white matter of the hemispheres.  At least a dozen foci in the hemispheres enhance after contrast consistent with acute demyelination.  These and some other foci are new compared to the 05/19/2020 MRI.  MRI of the cervical spine 07/15/2020 shows multiple T2 hyperintense foci in the spinal cord.  None of  the foci enhance after contrast.  Some foci are new compared to the 12/11/2019 MRI.  MRI of the thoracic spine 07/15/2020 showed multiple T2 hyperintense foci in the thoracic spinal cord.  1 focus at T4 appears to be enhancing.  There are several new lesions compared to the 12/16/2019 MRI.  MRI of the brain 05/19/2020 showed multiple T2/FLAIR hyperintense foci in the periventricular, juxtacortical and deep white matter.  There are also foci in the cerebellum, left middle cerebellar peduncle and brainstem.  Multiple lesions enhance including some in the hemispheres, cerebellum and middle cerebellar peduncle.  Foci that enhanced on the March 2021 MRI appear smaller and do not enhance  MRI of the brain  12/16/2019 shows Multiple T2/FLAIR hyperintense foci in the periventricular, juxtacortical and deep white matter of the hemispheres.  Additionally, foci are noted in the brainstem and cerebellum enhancement.  MRI of the brain 12/11/2019 shows multiple T2/FLAIR hyperintense foci in the periventricular, juxtacortical and deep white matter of the hemispheres.  Many of the periventricular foci are radially oriented to the ventricles.  Foci are noted in the deep gray matter, brainstem and cerebellar peduncles multiple lesions enhance.  MRI of the cervical spine 12/11/2019 shows extensive T2 hyperintense foci in the spinal cord the most prominent focus is posteriorly to the right at C2.  None of the foci enhanced.  MRI of the thoracic spine 12/11/2019 showed multiple T2 hyperintense foci within the spinal cord.  There was enhancement at T1-T2 and T10-T11.  MRI of the lumbar spine 12/11/2019 showed some demyelination in the distal spinal cord but no enhancement.  There is a small disc protrusion at L5-S1 that does not cause nerve root compression or spinal stenosis.  Laboratory tests: 06/19/2020: Hep B surface antibody positive.  Hep B surface antigen and hep C core antibody are negative.   QuantiFERON-TB is negative VZV  IgG shows that he is not immune to varicella.  Labs done March 2021 admission: RPR negative, HIV negative, ACE normal.  NMO antibody was negative.  SARS-CoV-2 PCR was negative.  Cerebrospinal fluid December 12, 2019: IgG index 1.1 (this is very elevated consistent with MS, oligoclonal bands was not performed).  CSF was otherwise normal. .    REVIEW OF SYSTEMS: Constitutional: No fevers, chills, sweats, or change in appetite.  He notes fatigue Eyes: No visual changes, double vision, eye pain Ear, nose and throat: No hearing loss, ear pain, nasal congestion, sore throat Cardiovascular: No chest pain, palpitations Respiratory:  No shortness of breath at rest or with exertion.   No wheezes GastrointestinaI: No nausea, vomiting, diarrhea, abdominal pain, fecal incontinence Genitourinary:  No dysuria, urinary retention or frequency.  No nocturia. Musculoskeletal:  No neck pain, back pain Integumentary: No rash, pruritus, skin lesions Neurological: as above Psychiatric: As above Endocrine:  No palpitations, diaphoresis, change in appetite, change in weigh or increased thirst Hematologic/Lymphatic:  No anemia, purpura, petechiae. Allergic/Immunologic: No itchy/runny eyes, nasal congestion, recent allergic reactions, rashes  ALLERGIES: No Known Allergies  HOME MEDICATIONS:  Current Outpatient Medications:    acetaminophen  (TYLENOL ) 500 MG tablet, Take 1,000 mg by mouth every 6 (six) hours as needed for headache (pain)., Disp: , Rfl:    Baclofen  5 MG TABS, Take 5 mg by mouth 3 (three) times daily., Disp: 90 tablet, Rfl: 11   KESIMPTA  20 MG/0.4ML SOAJ, Inject 0.4 mLs into the skin every 30 (thirty) days., Disp: 1.2 mL, Rfl: 6   ondansetron  (ZOFRAN ) 4 MG tablet, Take 1 tablet (4 mg total) by mouth every 8 (eight) hours as needed for nausea or vomiting. (Patient taking differently: Take 4 mg by mouth daily as needed (with each dose of Cymbalta  (duloxetine )).), Disp: 60 tablet, Rfl: 0   oxybutynin   (DITROPAN ) 5 MG tablet, Take 1 tablet (5 mg total) by mouth 2 (two) times daily., Disp: 60 tablet, Rfl: 5   docusate sodium  (COLACE) 100 MG capsule, Take 1 capsule (100 mg total) by mouth 3 (three) times daily. (Patient not taking: Reported on 11/09/2023), Disp: 10 capsule, Rfl: 0   DULoxetine  (CYMBALTA ) 60 MG capsule, Take 1 capsule (60 mg total) by mouth daily. (Patient not taking: Reported on 11/09/2023), Disp: 90 capsule, Rfl: 5   midodrine  (PROAMATINE ) 10 MG tablet, Take 1 tablet (10 mg total) by mouth 2 (two) times daily with a meal. (Patient not taking: Reported on 11/09/2023), Disp: 60 tablet, Rfl: 11  PAST MEDICAL HISTORY: Past Medical History:  Diagnosis Date   Autism    Depression    MS (multiple sclerosis) (HCC)     PAST SURGICAL HISTORY: Past Surgical History:  Procedure Laterality Date   NO PAST SURGERIES      FAMILY HISTORY: Family History  Problem Relation Age of Onset   Clotting disorder Mother    Multiple sclerosis Maternal Uncle     SOCIAL HISTORY:  Social History   Socioeconomic History   Marital status: Single    Spouse name: Not on file   Number of children: Not on file   Years of education: Not on file   Highest education level: Not on file  Occupational History   Not on file  Tobacco Use   Smoking status: Never   Smokeless tobacco: Never  Vaping Use   Vaping status: Never Used  Substance and Sexual Activity   Alcohol use: No   Drug use: Never   Sexual activity: Not Currently  Other Topics Concern   Not on file  Social History Narrative   Right handed    Caffeine use: soda sometimes   Lives with family   Social Drivers of Corporate Investment Banker Strain: Not on file  Food Insecurity: Not on file  Transportation Needs: Not on file  Physical Activity: Not on file  Stress: Not on file  Social Connections: Not on file  Intimate Partner Violence: Not on file     PHYSICAL EXAM  Vitals:   11/09/23 1517  BP: 122/77  Pulse: 90   Weight: 123 lb (55.8 kg)  Height: 5' 9 (1.753 m)     Body mass index is 18.16 kg/m.   General: The patient is well-developed and well-nourished and in no acute distress  HEENT:  Head is [place]/AT.  Sclera are anicteric.     Skin: Extremities are without rash or  edema.  Musculoskeletal:  Back is nontender  Neurologic Exam  Mental status: The patient is alert and oriented at the time of the examination. The patient has apparent normal recent and remote memory, with an apparently normal attention span and concentration ability.   Speech content is normal.  Cranial nerves: Extraocular movements are full. He has a right APD. Reduced color vision OD.  Facial strength and sensation is normal.  No obvious hearing deficits are noted.  Motor:  Muscle bulk is normal.   Tone is normal. Strength was 5/5 in the arms and left leg, 4+/5 in the iliopsoas and ankle/toe extensors and 5/5 elsewhere in the right leg.  Sensory: Sensory testing is intact to touch and vibration.  He has reduced vibration sensation in the legs.  Coordination: Cerebellar testing reveals reduced finger-nose-finger and reduced heel-to-shin bilaterally.  Gait and station: Station is normal.  His gait is ataxic.  He is able to take some steps in the room without his walker but does much better with the walker.   He cannot tandem walk.  Romberg is positive.  Reflexes: Deep tendon reflexes are symmetric 3 in the arms and 3+ at the knees where he has crossed abductor reflexes.  He has sustained clonus at the ankles.    DIAGNOSTIC DATA (LABS, IMAGING, TESTING) - I reviewed patient records, labs, notes, testing and imaging myself where available.  Lab Results  Component Value Date   WBC 7.9 06/08/2023   HGB 15.5 06/08/2023   HCT 46.8 06/08/2023   MCV 95 06/08/2023   PLT 239 06/08/2023      Component Value Date/Time   NA 138 10/25/2021 1357   K 4.0 10/25/2021 1357   CL 108 10/25/2021 1357   CO2 27 10/25/2021 1357    GLUCOSE 94 10/25/2021 1357   BUN 20 10/25/2021 1357   CREATININE 0.89 10/25/2021 1357   CALCIUM 8.5 (L) 10/25/2021 1357   PROT 6.8 10/25/2021 1357   ALBUMIN 4.1 10/25/2021 1357   ALBUMIN 4.6 12/12/2019 0002   AST 15 10/25/2021 1357   ALT 17 10/25/2021 1357   ALKPHOS 47 10/25/2021 1357   BILITOT 1.0 10/25/2021 1357   GFRNONAA >60 10/25/2021 1357   GFRAA >60 06/02/2020 0516   No results found for: CHOL, HDL, LDLCALC, LDLDIRECT, TRIG, CHOLHDL Lab Results  Component Value Date   HGBA1C 5.3 12/14/2019   Lab Results  Component Value Date   VITAMINB12 678 12/11/2019   No results found for: TSH     ASSESSMENT AND PLAN  Multiple sclerosis (HCC)  High risk medication use  Autism spectrum disorder  POTS (postural orthostatic tachycardia syndrome)  Poor balance   1.  Continue Kesimpta  as is MS DMT.  Check labs.  Check MRI to determine if any breakthrough activity.   2.  Continue baclofen , duloxetine ..   3..  add tamsulosin  for urinary hesitancy rtc 6 months sooner if problems    Dorethy Tomey A. Vear, MD, [facility] 11/09/2023, 3:38 PM Certified in Neurology, Clinical Neurophysiology, Sleep Medicine and Neuroimaging  [facility] [address] [place], [place] [address] [phone]

## 2023-11-10 ENCOUNTER — Telehealth: Payer: Self-pay | Admitting: Neurology

## 2023-11-10 ENCOUNTER — Telehealth: Payer: Self-pay

## 2023-11-10 LAB — CBC WITH DIFFERENTIAL/PLATELET
Basophils Absolute: 0.1 10*3/uL (ref 0.0–0.2)
Basos: 1 %
EOS (ABSOLUTE): 0.3 10*3/uL (ref 0.0–0.4)
Eos: 3 %
Hematocrit: 44.8 % (ref 37.5–51.0)
Hemoglobin: 15.5 g/dL (ref 13.0–17.7)
Immature Grans (Abs): 0 10*3/uL (ref 0.0–0.1)
Immature Granulocytes: 0 %
Lymphocytes Absolute: 1.4 10*3/uL (ref 0.7–3.1)
Lymphs: 16 %
MCH: 31.6 pg (ref 26.6–33.0)
MCHC: 34.6 g/dL (ref 31.5–35.7)
MCV: 91 fL (ref 79–97)
Monocytes Absolute: 0.6 10*3/uL (ref 0.1–0.9)
Monocytes: 7 %
Neutrophils Absolute: 6 10*3/uL (ref 1.4–7.0)
Neutrophils: 73 %
Platelets: 249 10*3/uL (ref 150–450)
RBC: 4.91 x10E6/uL (ref 4.14–5.80)
RDW: 13.1 % (ref 11.6–15.4)
WBC: 8.4 10*3/uL (ref 3.4–10.8)

## 2023-11-10 LAB — IGG, IGA, IGM
IgA/Immunoglobulin A, Serum: 246 mg/dL (ref 90–386)
IgG (Immunoglobin G), Serum: 1268 mg/dL (ref 603–1613)
IgM (Immunoglobulin M), Srm: 53 mg/dL (ref 20–172)

## 2023-11-10 NOTE — Telephone Encounter (Addendum)
 Called pt's mother and let her know Per Dr. Godwin Lat that pt's Lab Results were. ----- Message from Jorie Newness sent at 11/10/2023  8:42 AM EST ----- Please let the patient know that the lab work is fine.

## 2023-11-10 NOTE — Telephone Encounter (Signed)
 Referral internal medicine fax to [facility]. Phone: [phone], [phone]

## 2023-11-16 ENCOUNTER — Telehealth: Payer: Self-pay | Admitting: Neurology

## 2023-11-16 NOTE — Telephone Encounter (Signed)
[employer] auth: [id number] exp. 11/16/23-01/15/24 sent to [facility] [phone]

## 2023-12-13 NOTE — Telephone Encounter (Signed)
 [facility] called and said that his medicaid plan is out of network with them and he will have to go somewhere else if he doesn't want to pay.

## 2023-12-14 NOTE — Telephone Encounter (Signed)
 I have updated the facility to [facility] and sent it to them to schedule. [phone]

## 2023-12-18 ENCOUNTER — Other Ambulatory Visit: Payer: MEDICAID

## 2024-06-08 ENCOUNTER — Telehealth: Payer: Self-pay | Admitting: Neurology

## 2024-06-08 DIAGNOSIS — G35 Multiple sclerosis: Secondary | ICD-10-CM

## 2024-06-08 NOTE — Telephone Encounter (Signed)
 Pharmacy called to request medication  refill KESIMPTA  20 MG/0.4ML SOAJ  , Pt is out of medication and was hoping to get medication sent out today    [facility] [place] [place] [address] (Ph: [phone])

## 2024-06-11 MED ORDER — KESIMPTA 20 MG/0.4ML ~~LOC~~ SOAJ: 0.4000 mL | SUBCUTANEOUS | 2 refills | Status: DC

## 2024-06-11 NOTE — Telephone Encounter (Signed)
 Pt mother called to follow up about Pt medication refill,Mother states Pt is out of medication   Just wanted to follow up

## 2024-06-11 NOTE — Telephone Encounter (Signed)
 [facility] appears. Per [facility], it appears there was a recent dispense of Kesimpta  06/06/24. I called them at [phone] to try and confirm. Spoke w/ tech. Last sent 05/05/24. However, showing delivery sent out for pt but it was attached to expired rx and they needed updated rx so they could not proceed. She transferred me to pharmacist to provide VO. I provided VO for Kesipmta 20mg /0.59ml, 0.4ml SQ q 30 days, qty:0.66ml, 2 refills. Asked them to expedite order since pt out of med.   Pt last seen 11/09/23 and has no f/u scheduled. I will ask phone room to call back and get pt scheduled.   Phone room:  please call back and let them know we called pharmacy and they are getting medication ready for shipment. They should be calling them to set up.   2. He also is due for a follow up appointment. Please schedule an appointment

## 2024-06-18 NOTE — Telephone Encounter (Signed)
 Called and LVM for pt to call back and schedule a f/u appt.

## 2024-06-28 ENCOUNTER — Telehealth: Payer: Self-pay | Admitting: Neurology

## 2024-06-28 NOTE — Telephone Encounter (Signed)
 Jaime [name]- I see where Dr. Vear ordered MRI 11/09/23 but pt did not complete. Can this be re-authorized and pt scheduled?   MS type: Relapsing remitting MS

## 2024-06-28 NOTE — Telephone Encounter (Signed)
 Pt's mother is asking if before next appointment does a MRI need to be scheduled, also pt's mother is asking for a call to clarify the type MS pt has(this is for annual paperwork she has to complete for pt)

## 2024-06-28 NOTE — Telephone Encounter (Signed)
 Spoke w/ Metta. She states MRI order from 11/2023 still valid but she will need to get approval through insurance again. She will work on this.   Phone room: please call and relay above. Also let them know MS type: relapsing remitting MS

## 2024-07-02 NOTE — Telephone Encounter (Signed)
 I submitted a new PA for the MRI and it's pending medical review. Tracking #[id number] for [facility].

## 2024-07-09 NOTE — Telephone Encounter (Signed)
 [employer] auth: [id number] exp. 07/02/24-08/31/24 sent to [facility] [phone]

## 2024-08-16 ENCOUNTER — Telehealth: Payer: Self-pay | Admitting: Neurology

## 2024-08-16 DIAGNOSIS — G35D Multiple sclerosis, unspecified: Secondary | ICD-10-CM

## 2024-08-16 MED ORDER — KESIMPTA 20 MG/0.4ML ~~LOC~~ SOAJ: 0.4000 mL | SUBCUTANEOUS | 2 refills | Status: DC

## 2024-08-16 NOTE — Telephone Encounter (Signed)
 Dr. Vear- pt last saw you 11/09/23. Did not complete MRI ordered. Next appt not until 01/23/25. Last labs 11/09/23. Did you want to fit in sooner for appt? If so, ok for Monday afternoon?   Ok to refill medication?

## 2024-08-16 NOTE — Telephone Encounter (Signed)
 Phone room: call pt back and let him know we sent in refill for Kesimpta . Dr. Vear wants him to follow up Dec/Jan timeframe. Please offer 10/08/24 at 2:30pm (appt on hold)

## 2024-08-16 NOTE — Telephone Encounter (Signed)
 Called Pt , No Answer , Left Detailed VM to call office back

## 2024-08-16 NOTE — Telephone Encounter (Signed)
 Pt is requesting a refill for KESIMPTA  20 MG/0.4ML SOAJ.  Pharmacy: [facility]

## 2024-09-03 NOTE — Telephone Encounter (Signed)
 LVM for pt with this information. Left office number for pt to call back and confirm if he is able to take offered appt time & date.

## 2024-09-04 NOTE — Telephone Encounter (Signed)
 Pt mother called back Inform of Nurse note , Appt Scheduled

## 2024-10-07 NOTE — Progress Notes (Unsigned)
 "  [facility]  PATIENT: Jaime [name] DOB: 16-Feb-1996  REFERRING DOCTOR OR PCP: Luke Manns, MD SOURCE: Patient, notes from hospital, imaging and lab reports, MRI images personally reviewed.  _________________________________   HISTORICAL  CHIEF COMPLAINT:  No chief complaint on file.   HISTORY OF PRESENT ILLNESS:  Jaime [name] is a 29 y.o. 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     Update  10/08/2024: He is on Kesimpta  and is tolerating it well  .He had either breakthrough or a rebound while on Tysabri  (missed some doses) anfd Kesimpta  is much easier as no travel required.  His mother is able to do the injections for him.  He is doing better and walking without his walker in the house and uses it outdoors.   SABRA   He feels hands are strong but he has some clumsiness and tremor. He has some spasticity helped by baclofen .     He has ankle clonus when leg flexed backwards  He drinks a lot of water and then urinates several times at night.  He wears Depends at night.due to occasional incontinence,      He has hesitancy and reduced stream and often has double voids.    He sleeps well most nights now.    He has autism.  He notes mild depression and anxiety.   He feels cognition is doing about the same as before the MS but he has some word finding slowness at times - better than earlir.   His great great uncle had MS.     He has medical issues include POTS, autism spectrum disorder and depression.   Sertraline  was prescribed for mood and depression but he prefers not to take it.   .     MS history: He was diagnosed with MS March 2021 after presenting with poor balance, disorientation.  At the time, he was walking his dog and collapsed prompting a visit to the ED.   MRI's of the head and spine showed multiple lesions c/w MS.   In retrospect, he had some imbalance in February but was walking at his baseline January 2021.   Multiple brain and T-spine lesions enhanced.   He ws admitted and  received 5 days of IV solu-medrol .   He improved but not to baseline.  He did outpatient rehab.  He never saw neurology as outpatient.    In August 2021, he began to have more difficulty with his gait and re-presented to the ED 05/19/2020.    It showed numerous enhancing lesions and he was admitted for 5 more days of IV Solu-medrol .    He improved some but is not at baseline.  Imaging review: MRI of the brain 10/19/2021 shows multiple T2/FLAIR hyperintense foci in the periventricular, juxtacortical and deep white matter of the hemispheres, medulla, pons, middle cerebellar peduncles, cerebellum, midbrain, thalamus about 8 or 9 foci in the hemispheres enhance after contrast consistent with acute lesions.  None of the brainstem lesions appear to enhance.  MRI of the cervical spine 10/19/2021 shows multiple T2 hyperintense foci in the spinal cord.  A large 1 is located centrally and posteriorly from C1-C2 to C3.  Focus to the right adjacent to C2-C3, posteriorly at C3-C4, focus to the right adjacent to C4, anterolaterally to the right adjacent to C5, posteriorly adjacent to C5-C6, centrally adjacent to C7.  Foci also in the thoracic spine.  None of these enhance.  Most of these appear to be present on the MRI from 2021.  MRI  brain 07/15/2020 shows multiple T2/FLAIR hyperintense foci in the pons, middle cerebral hemispheres, cerebellum, spinal cord, medulla, thalamus and in the periventricular, juxtacortical and deep white matter of the hemispheres.  At least a dozen foci in the hemispheres enhance after contrast consistent with acute demyelination.  These and some other foci are new compared to the 05/19/2020 MRI.  MRI of the cervical spine 07/15/2020 shows multiple T2 hyperintense foci in the spinal cord.  None of the foci enhance after contrast.  Some foci are new compared to the 12/11/2019 MRI.  MRI of the thoracic spine 07/15/2020 showed multiple T2 hyperintense foci in the thoracic spinal cord.  1 focus at T4  appears to be enhancing.  There are several new lesions compared to the 12/16/2019 MRI.  MRI of the brain 05/19/2020 showed multiple T2/FLAIR hyperintense foci in the periventricular, juxtacortical and deep white matter.  There are also foci in the cerebellum, left middle cerebellar peduncle and brainstem.  Multiple lesions enhance including some in the hemispheres, cerebellum and middle cerebellar peduncle.  Foci that enhanced on the March 2021 MRI appear smaller and do not enhance  MRI of the brain  12/16/2019 shows Multiple T2/FLAIR hyperintense foci in the periventricular, juxtacortical and deep white matter of the hemispheres.  Additionally, foci are noted in the brainstem and cerebellum enhancement.  MRI of the brain 12/11/2019 shows multiple T2/FLAIR hyperintense foci in the periventricular, juxtacortical and deep white matter of the hemispheres.  Many of the periventricular foci are radially oriented to the ventricles.  Foci are noted in the deep gray matter, brainstem and cerebellar peduncles multiple lesions enhance.  MRI of the cervical spine 12/11/2019 shows extensive T2 hyperintense foci in the spinal cord the most prominent focus is posteriorly to the right at C2.  None of the foci enhanced.  MRI of the thoracic spine 12/11/2019 showed multiple T2 hyperintense foci within the spinal cord.  There was enhancement at T1-T2 and T10-T11.  MRI of the lumbar spine 12/11/2019 showed some demyelination in the distal spinal cord but no enhancement.  There is a small disc protrusion at L5-S1 that does not cause nerve root compression or spinal stenosis.  Laboratory tests: 06/19/2020: Hep B surface antibody positive.  Hep B surface antigen and hep C core antibody are negative.   QuantiFERON-TB is negative VZV IgG shows that he is not immune to varicella.  Labs done March 2021 admission: RPR negative, HIV negative, ACE normal.  NMO antibody was negative.  SARS-CoV-2 PCR was negative.  Cerebrospinal fluid  December 12, 2019: IgG index 1.1 (this is very elevated consistent with MS, oligoclonal bands was not performed).  CSF was otherwise normal. .    REVIEW OF SYSTEMS: Constitutional: No fevers, chills, sweats, or change in appetite.  He notes fatigue Eyes: No visual changes, double vision, eye pain Ear, nose and throat: No hearing loss, ear pain, nasal congestion, sore throat Cardiovascular: No chest pain, palpitations Respiratory:  No shortness of breath at rest or with exertion.   No wheezes GastrointestinaI: No nausea, vomiting, diarrhea, abdominal pain, fecal incontinence Genitourinary:  No dysuria, urinary retention or frequency.  No nocturia. Musculoskeletal:  No neck pain, back pain Integumentary: No rash, pruritus, skin lesions Neurological: as above Psychiatric: As above Endocrine: No palpitations, diaphoresis, change in appetite, change in weigh or increased thirst Hematologic/Lymphatic:  No anemia, purpura, petechiae. Allergic/Immunologic: No itchy/runny eyes, nasal congestion, recent allergic reactions, rashes  ALLERGIES: No Known Allergies  HOME MEDICATIONS:  Current Outpatient Medications:  acetaminophen  (TYLENOL ) 500 MG tablet, Take 1,000 mg by mouth every 6 (six) hours as needed for headache (pain)., Disp: , Rfl:    Baclofen  5 MG TABS, Take 5 mg by mouth 3 (three) times daily., Disp: 90 tablet, Rfl: 11   docusate sodium  (COLACE) 100 MG capsule, Take 1 capsule (100 mg total) by mouth 3 (three) times daily. (Patient not taking: Reported on 11/09/2023), Disp: 10 capsule, Rfl: 0   DULoxetine  (CYMBALTA ) 60 MG capsule, Take 1 capsule (60 mg total) by mouth daily. (Patient not taking: Reported on 11/09/2023), Disp: 90 capsule, Rfl: 5   KESIMPTA  20 MG/0.4ML SOAJ, Inject 0.4 mLs into the skin every 30 (thirty) days., Disp: 0.4 mL, Rfl: 2   midodrine  (PROAMATINE ) 10 MG tablet, Take 1 tablet (10 mg total) by mouth 2 (two) times daily with a meal. (Patient not taking: Reported on  11/09/2023), Disp: 60 tablet, Rfl: 11   ondansetron  (ZOFRAN ) 4 MG tablet, Take 1 tablet (4 mg total) by mouth every 8 (eight) hours as needed for nausea or vomiting. (Patient taking differently: Take 4 mg by mouth daily as needed (with each dose of Cymbalta  (duloxetine )).), Disp: 60 tablet, Rfl: 0   oxybutynin  (DITROPAN ) 5 MG tablet, Take 1 tablet (5 mg total) by mouth 2 (two) times daily., Disp: 60 tablet, Rfl: 5   tamsulosin  (FLOMAX ) 0.4 MG CAPS capsule, Take 1 capsule (0.4 mg total) by mouth daily., Disp: 30 capsule, Rfl: 11  PAST MEDICAL HISTORY: Past Medical History:  Diagnosis Date   Autism    Depression    MS (multiple sclerosis)     PAST SURGICAL HISTORY: Past Surgical History:  Procedure Laterality Date   NO PAST SURGERIES      FAMILY HISTORY: Family History  Problem Relation Age of Onset   Clotting disorder Mother    Multiple sclerosis Maternal Uncle     SOCIAL HISTORY:  Social History   Socioeconomic History   Marital status: Single    Spouse name: Not on file   Number of children: Not on file   Years of education: Not on file   Highest education level: Not on file  Occupational History   Not on file  Tobacco Use   Smoking status: Never   Smokeless tobacco: Never  Vaping Use   Vaping status: Never Used  Substance and Sexual Activity   Alcohol use: No   Drug use: Never   Sexual activity: Not Currently  Other Topics Concern   Not on file  Social History Narrative   Right handed    Caffeine use: soda sometimes   Lives with family   Social Drivers of Health   Tobacco Use: Low Risk (11/09/2023)   Patient History    Smoking Tobacco Use: Never    Smokeless Tobacco Use: Never    Passive Exposure: Not on file  Financial Resource Strain: Not on file  Food Insecurity: Not on file  Transportation Needs: Not on file  Physical Activity: Not on file  Stress: Not on file  Social Connections: Not on file  Intimate Partner Violence: Not on file  Depression  (PHQ2-9): Low Risk (01/04/2022)   Depression (PHQ2-9)    PHQ-2 Score: 2  Alcohol Screen: Not on file  Housing: Not on file  Utilities: Not on file  Health Literacy: Not on file     PHYSICAL EXAM  There were no vitals filed for this visit.    There is no height or weight on file to calculate BMI.  General: The patient is well-developed and well-nourished and in no acute distress  HEENT:  Head is [place]/AT.  Sclera are anicteric.     Skin: Extremities are without rash or  edema.  Musculoskeletal:  Back is nontender  Neurologic Exam  Mental status: The patient is alert and oriented at the time of the examination. The patient has apparent normal recent and remote memory, with an apparently normal attention span and concentration ability.   Speech content is normal.  Cranial nerves: Extraocular movements are full. He has a right APD. Reduced color vision OD.  Facial strength and sensation is normal.  No obvious hearing deficits are noted.  Motor:  Muscle bulk is normal.   Tone is normal. Strength was 5/5 in the arms and left leg, 4+/5 in the iliopsoas and ankle/toe extensors and 5/5 elsewhere in the right leg.  Sensory: Sensory testing is intact to touch and vibration.  He has reduced vibration sensation in the legs.  Coordination: Cerebellar testing reveals reduced finger-nose-finger and reduced heel-to-shin bilaterally.  Gait and station: Station is normal.  His gait is ataxic.  He is able to take some steps in the room without his walker but does much better with the walker.   He cannot tandem walk.  Romberg is positive.  Reflexes: Deep tendon reflexes are symmetric 3 in the arms and 3+ at the knees where he has crossed abductor reflexes.  He has sustained clonus at the ankles.    DIAGNOSTIC DATA (LABS, IMAGING, TESTING) - I reviewed patient records, labs, notes, testing and imaging myself where available.  Lab Results  Component Value Date   WBC 8.4 11/09/2023   HGB 15.5  11/09/2023   HCT 44.8 11/09/2023   MCV 91 11/09/2023   PLT 249 11/09/2023      Component Value Date/Time   NA 138 10/25/2021 1357   K 4.0 10/25/2021 1357   CL 108 10/25/2021 1357   CO2 27 10/25/2021 1357   GLUCOSE 94 10/25/2021 1357   BUN 20 10/25/2021 1357   CREATININE 0.89 10/25/2021 1357   CALCIUM 8.5 (L) 10/25/2021 1357   PROT 6.8 10/25/2021 1357   ALBUMIN 4.1 10/25/2021 1357   ALBUMIN 4.6 12/12/2019 0002   AST 15 10/25/2021 1357   ALT 17 10/25/2021 1357   ALKPHOS 47 10/25/2021 1357   BILITOT 1.0 10/25/2021 1357   GFRNONAA >60 10/25/2021 1357   GFRAA >60 06/02/2020 0516   No results found for: CHOL, HDL, LDLCALC, LDLDIRECT, TRIG, CHOLHDL Lab Results  Component Value Date   HGBA1C 5.3 12/14/2019   Lab Results  Component Value Date   VITAMINB12 678 12/11/2019   No results found for: TSH     ASSESSMENT AND PLAN  No diagnosis found.   1.  Continue Kesimpta  as is MS DMT.  Check labs.  Check MRI to determine if any breakthrough activity.   2.  Continue baclofen , duloxetine ..   3..  add tamsulosin  for urinary hesitancy rtc 6 months sooner if problems    Dillin Lofgren A. Vear, MD, [facility] 10/07/2024, 7:43 PM Certified in Neurology, Clinical Neurophysiology, Sleep Medicine and Neuroimaging  [facility] [address] [place], [place] [address] [phone] "

## 2024-10-08 ENCOUNTER — Encounter: Payer: Self-pay | Admitting: Neurology

## 2024-10-08 ENCOUNTER — Ambulatory Visit (INDEPENDENT_AMBULATORY_CARE_PROVIDER_SITE_OTHER): Payer: MEDICAID | Admitting: Neurology

## 2024-10-08 VITALS — BP 101/56 | HR 84 | Resp 15 | Ht 69.0 in | Wt 118.5 lb

## 2024-10-08 DIAGNOSIS — G35D Multiple sclerosis, unspecified: Secondary | ICD-10-CM | POA: Diagnosis not present

## 2024-10-08 DIAGNOSIS — F840 Autistic disorder: Secondary | ICD-10-CM | POA: Diagnosis not present

## 2024-10-08 DIAGNOSIS — G35A Relapsing-remitting multiple sclerosis: Secondary | ICD-10-CM | POA: Diagnosis not present

## 2024-10-08 DIAGNOSIS — Z79899 Other long term (current) drug therapy: Secondary | ICD-10-CM

## 2024-10-08 DIAGNOSIS — N399 Disorder of urinary system, unspecified: Secondary | ICD-10-CM | POA: Diagnosis not present

## 2024-10-08 DIAGNOSIS — R269 Unspecified abnormalities of gait and mobility: Secondary | ICD-10-CM | POA: Diagnosis not present

## 2024-10-08 DIAGNOSIS — G90A Postural orthostatic tachycardia syndrome (POTS): Secondary | ICD-10-CM

## 2024-10-08 MED ORDER — DULOXETINE HCL 60 MG PO CPEP: 60.0000 mg | ORAL_CAPSULE | Freq: Every day | ORAL | 5 refills | Status: AC

## 2024-10-08 MED ORDER — KESIMPTA 20 MG/0.4ML ~~LOC~~ SOAJ: 0.4000 mL | SUBCUTANEOUS | 4 refills | Status: AC

## 2024-10-08 MED ORDER — TAMSULOSIN HCL 0.4 MG PO CAPS: 0.4000 mg | ORAL_CAPSULE | Freq: Every day | ORAL | 3 refills | Status: AC

## 2024-10-08 MED ORDER — OXYBUTYNIN CHLORIDE 5 MG PO TABS: 5.0000 mg | ORAL_TABLET | Freq: Two times a day (BID) | ORAL | 3 refills | Status: AC

## 2024-10-09 ENCOUNTER — Ambulatory Visit: Payer: Self-pay | Admitting: Neurology

## 2024-10-09 LAB — IGG, IGA, IGM
IgG (Immunoglobin G), Serum: 1382 mg/dL (ref 603–1613)
IgM (Immunoglobulin M), Srm: 58 mg/dL (ref 20–172)
Immunoglobulin A, (IgA) QN, Serum: 269 mg/dL (ref 90–386)

## 2024-10-09 LAB — CBC WITH DIFFERENTIAL/PLATELET
Basophils Absolute: 0.1 x10E3/uL (ref 0.0–0.2)
Basos: 1 %
EOS (ABSOLUTE): 0.2 x10E3/uL (ref 0.0–0.4)
Eos: 2 %
Hematocrit: 49.1 % (ref 37.5–51.0)
Hemoglobin: 16.4 g/dL (ref 13.0–17.7)
Immature Grans (Abs): 0 x10E3/uL (ref 0.0–0.1)
Immature Granulocytes: 0 %
Lymphocytes Absolute: 1.7 x10E3/uL (ref 0.7–3.1)
Lymphs: 16 %
MCH: 31.6 pg (ref 26.6–33.0)
MCHC: 33.4 g/dL (ref 31.5–35.7)
MCV: 95 fL (ref 79–97)
Monocytes Absolute: 0.6 x10E3/uL (ref 0.1–0.9)
Monocytes: 6 %
Neutrophils Absolute: 7.7 x10E3/uL — ABNORMAL HIGH (ref 1.4–7.0)
Neutrophils: 75 %
Platelets: 268 x10E3/uL (ref 150–450)
RBC: 5.19 x10E6/uL (ref 4.14–5.80)
RDW: 12.8 % (ref 11.6–15.4)
WBC: 10.3 x10E3/uL (ref 3.4–10.8)

## 2024-10-10 ENCOUNTER — Telehealth: Payer: Self-pay | Admitting: Neurology

## 2024-10-10 NOTE — Telephone Encounter (Signed)
 medicaid shara: [id number] exp. 10/10/24-12/09/24 sent to [facility] [phone]

## 2024-10-15 NOTE — Telephone Encounter (Signed)
 Informed pt's mother. She was really appreciative.

## 2024-10-24 ENCOUNTER — Ambulatory Visit (HOSPITAL_COMMUNITY)
Admission: RE | Admit: 2024-10-24 | Discharge: 2024-10-24 | Disposition: A | Discharge status: Home / Self Care | Payer: MEDICAID | Source: Ambulatory Visit | Attending: Neurology | Admitting: Neurology

## 2024-10-24 DIAGNOSIS — G35A Relapsing-remitting multiple sclerosis: Secondary | ICD-10-CM | POA: Insufficient documentation

## 2024-10-24 DIAGNOSIS — R269 Unspecified abnormalities of gait and mobility: Secondary | ICD-10-CM | POA: Insufficient documentation

## 2024-10-24 MED ORDER — GADOBUTROL 1 MMOL/ML IV SOLN
5.5000 mL | Freq: Once | INTRAVENOUS | Status: AC | PRN
  Administered 2024-10-24: 5.5 mL via INTRAVENOUS

## 2025-01-23 ENCOUNTER — Ambulatory Visit: Payer: MEDICAID | Admitting: Neurology
